# Patient Record
Sex: Male | Born: 1960 | Race: Black or African American | Hispanic: No | Marital: Single | Smoking: Current some day smoker
Health system: Southern US, Community
[De-identification: ages and names within clinical notes are randomized; demographics above are authoritative.]

## PROBLEM LIST (undated history)

## (undated) DIAGNOSIS — F332 Major depressive disorder, recurrent severe without psychotic features: Secondary | ICD-10-CM

## (undated) DIAGNOSIS — Z5900 Homelessness unspecified: Secondary | ICD-10-CM

## (undated) DIAGNOSIS — F1410 Cocaine abuse, uncomplicated: Secondary | ICD-10-CM

## (undated) DIAGNOSIS — F1994 Other psychoactive substance use, unspecified with psychoactive substance-induced mood disorder: Secondary | ICD-10-CM

## (undated) HISTORY — PX: HERNIA REPAIR: SHX51

---

## 2016-12-17 ENCOUNTER — Other Ambulatory Visit: Payer: Self-pay

## 2016-12-17 ENCOUNTER — Encounter (HOSPITAL_COMMUNITY): Payer: Self-pay | Admitting: Emergency Medicine

## 2016-12-17 ENCOUNTER — Emergency Department (HOSPITAL_COMMUNITY)
Admission: EM | Admit: 2016-12-17 | Discharge: 2016-12-17 | Discharge status: Against Medical Advice | Payer: Self-pay | Attending: Internal Medicine | Admitting: Internal Medicine

## 2016-12-17 DIAGNOSIS — R42 Dizziness and giddiness: Secondary | ICD-10-CM | POA: Insufficient documentation

## 2016-12-17 DIAGNOSIS — R51 Headache: Secondary | ICD-10-CM | POA: Insufficient documentation

## 2016-12-17 LAB — CBC
HEMATOCRIT: 43.7 % (ref 39.0–52.0)
Hemoglobin: 14.6 g/dL (ref 13.0–17.0)
MCH: 31.7 pg (ref 26.0–34.0)
MCHC: 33.4 g/dL (ref 30.0–36.0)
MCV: 94.8 fL (ref 78.0–100.0)
Platelets: 220 10*3/uL (ref 150–400)
RBC: 4.61 MIL/uL (ref 4.22–5.81)
RDW: 14.6 % (ref 11.5–15.5)
WBC: 5.9 10*3/uL (ref 4.0–10.5)

## 2016-12-17 LAB — BASIC METABOLIC PANEL
Anion gap: 10 (ref 5–15)
BUN: 13 mg/dL (ref 6–20)
CHLORIDE: 108 mmol/L (ref 101–111)
CO2: 22 mmol/L (ref 22–32)
Calcium: 9.6 mg/dL (ref 8.9–10.3)
Creatinine, Ser: 1.39 mg/dL — ABNORMAL HIGH (ref 0.61–1.24)
GFR calc Af Amer: >60 mL/min (ref >60)
GFR calc non Af Amer: 56 mL/min — ABNORMAL LOW (ref >60)
Glucose, Bld: 101 mg/dL — ABNORMAL HIGH (ref 65–99)
POTASSIUM: 3.4 mmol/L — AB (ref 3.5–5.1)
SODIUM: 140 mmol/L (ref 135–145)

## 2016-12-17 NOTE — ED Notes (Signed)
Pt informed RN that he was leaving.  RN explained that he was actually next as he was the longest wait.  RN encouraged him that several beds were opening up shortly however he decided to leave.  Pt encouraged to return should symptoms get worse.

## 2016-12-17 NOTE — ED Triage Notes (Signed)
Pt states "i've got these headaches, I get dizzy when I bend over, i've been having headaches about a week now, and my vision got blurry yesterday."

## 2016-12-17 NOTE — ED Notes (Signed)
Pt has no facial droop, no neuro deficits, no drift, grip strength equal, ambulatory with steady gait

## 2018-02-21 ENCOUNTER — Emergency Department (HOSPITAL_COMMUNITY)
Admission: EM | Admit: 2018-02-21 | Discharge: 2018-02-21 | Disposition: A | Discharge status: Home / Self Care | Payer: Self-pay | Attending: Emergency Medicine | Admitting: Emergency Medicine

## 2018-02-21 ENCOUNTER — Encounter (HOSPITAL_COMMUNITY): Payer: Self-pay

## 2018-02-21 DIAGNOSIS — R55 Syncope and collapse: Secondary | ICD-10-CM | POA: Insufficient documentation

## 2018-02-21 DIAGNOSIS — F17200 Nicotine dependence, unspecified, uncomplicated: Secondary | ICD-10-CM | POA: Insufficient documentation

## 2018-02-21 LAB — BASIC METABOLIC PANEL
Anion gap: 6 (ref 5–15)
BUN: 12 mg/dL (ref 6–20)
CHLORIDE: 108 mmol/L (ref 98–111)
CO2: 29 mmol/L (ref 22–32)
CREATININE: 1.5 mg/dL — AB (ref 0.61–1.24)
Calcium: 9.2 mg/dL (ref 8.9–10.3)
GFR calc Af Amer: 59 mL/min — ABNORMAL LOW (ref >60)
GFR calc non Af Amer: 51 mL/min — ABNORMAL LOW (ref >60)
GLUCOSE: 85 mg/dL (ref 70–99)
Potassium: 3.6 mmol/L (ref 3.5–5.1)
SODIUM: 143 mmol/L (ref 135–145)

## 2018-02-21 LAB — URINALYSIS, ROUTINE W REFLEX MICROSCOPIC
Bilirubin Urine: NEGATIVE
GLUCOSE, UA: NEGATIVE mg/dL
Hgb urine dipstick: NEGATIVE
KETONES UR: NEGATIVE mg/dL
Leukocytes, UA: NEGATIVE
Nitrite: NEGATIVE
PROTEIN: NEGATIVE mg/dL
Specific Gravity, Urine: 1.03 (ref 1.005–1.030)
pH: 5 (ref 5.0–8.0)

## 2018-02-21 LAB — CBC
HCT: 44.8 % (ref 39.0–52.0)
Hemoglobin: 14.3 g/dL (ref 13.0–17.0)
MCH: 32.8 pg (ref 26.0–34.0)
MCHC: 31.9 g/dL (ref 30.0–36.0)
MCV: 102.8 fL — AB (ref 78.0–100.0)
PLATELETS: 187 10*3/uL (ref 150–400)
RBC: 4.36 MIL/uL (ref 4.22–5.81)
RDW: 13.6 % (ref 11.5–15.5)
WBC: 3.7 10*3/uL — ABNORMAL LOW (ref 4.0–10.5)

## 2018-02-21 LAB — I-STAT TROPONIN, ED: Troponin i, poc: 0.04 ng/mL (ref 0.00–0.08)

## 2018-02-21 LAB — CBG MONITORING, ED: Glucose-Capillary: 131 mg/dL — ABNORMAL HIGH (ref 70–99)

## 2018-02-21 MED ORDER — SODIUM CHLORIDE 0.9 % IV BOLUS: 1000.0000 mL | Freq: Once | INTRAVENOUS | Status: DC

## 2018-02-21 NOTE — ED Notes (Signed)
Gave pt [place] sandwich, graham crackers with peanut butter, gingerale

## 2018-02-21 NOTE — ED Notes (Signed)
Patient ambulated without difficulty with a steady gait from triage to his room in Pod C.

## 2018-02-21 NOTE — ED Notes (Signed)
Due to dark color and odor, sent a urine culture with the urine specimen

## 2018-02-21 NOTE — ED Provider Notes (Signed)
[facility] EMERGENCY DEPARTMENT Provider Note   CSN: [id number] Arrival date & time: 02/21/18  1340     History   Chief Complaint No chief complaint on file.   HPI Richard Moon is a 57 y.o. male.  HPI  57 year old male presents with near syncope.  Stated it first happened about a week ago and then again 3 days ago.  Happened again today.  3 days ago and today it happened while he is getting ready to go to work.  The episodes are brief and last about 5 minutes.  He will sometimes get a headache to start and then gets lightheaded like he is going to pass out.  Gets blurry vision, hand cramping, diaphoresis and diffuse weakness.  No chest pain or shortness of breath.  No vomiting.  He is been getting headaches on and off for several months but these are not daily headaches and are never severe in quality.  Each time he seems to be in a hot area like in his garage or outside.  Sometimes this occurs while standing and sometimes not.  Currently he feels completely asymptomatic. Urine has been darker than normal for about 2 weeks.  History reviewed. No pertinent past medical history.  There are no active problems to display for this patient.   Past Surgical History:  Procedure Laterality Date  . HERNIA REPAIR          Home Medications    Prior to Admission medications   Not on File    Family History No family history on file.  Social History Social History   Tobacco Use  . Smoking status: Current Some Day Smoker  . Smokeless tobacco: Never Used  Substance Use Topics  . Alcohol use: No  . Drug use: Not on file     Allergies   Patient has no known allergies.   Review of Systems Review of Systems  Constitutional: Positive for diaphoresis and fatigue. Negative for fever.  Eyes: Positive for visual disturbance.  Respiratory: Negative for shortness of breath.   Cardiovascular: Negative for chest pain and palpitations.  Gastrointestinal: Negative  for abdominal pain and vomiting.  Musculoskeletal: Positive for myalgias.  Neurological: Positive for light-headedness and headaches. Negative for weakness and numbness.  All other systems reviewed and are negative.    Physical Exam Updated Vital Signs BP 126/84 (BP Location: Right Arm)   Pulse (!) 58   Temp 98.5 F (36.9 C)   Resp 16   SpO2 100%   Physical Exam  Constitutional: He is oriented to person, place, and time. He appears well-developed and well-nourished. No distress.  HENT:  Head: Normocephalic and atraumatic.  Right Ear: External ear normal.  Left Ear: External ear normal.  Nose: Nose normal.  Eyes: Pupils are equal, round, and reactive to light. EOM are normal. Right eye exhibits no discharge. Left eye exhibits no discharge.  Neck: Neck supple.  Cardiovascular: Normal rate, regular rhythm and normal heart sounds.  No murmur heard. Pulmonary/Chest: Effort normal and breath sounds normal.  Abdominal: Soft. There is no tenderness.  Musculoskeletal: He exhibits no edema.  Neurological: He is alert and oriented to person, place, and time.  CN 3-12 grossly intact. 5/5 strength in all 4 extremities. Grossly normal sensation. Normal finger to nose.   Skin: Skin is warm and dry. He is not diaphoretic.  Nursing note and vitals reviewed.    ED Treatments / Results  Labs (all labs ordered are listed, but only abnormal results  are displayed) Labs Reviewed  BASIC METABOLIC PANEL - Abnormal; Notable for the following components:      Result Value   Creatinine, Ser 1.50 (*)    GFR calc non Af Amer 51 (*)    GFR calc Af Amer 59 (*)    All other components within normal limits  CBC - Abnormal; Notable for the following components:   WBC 3.7 (*)    MCV 102.8 (*)    All other components within normal limits  CBG MONITORING, ED - Abnormal; Notable for the following components:   Glucose-Capillary 131 (*)    All other components within normal limits  URINALYSIS, ROUTINE  W REFLEX MICROSCOPIC  I-STAT TROPONIN, ED    EKG EKG Interpretation  Date/Time:  Monday February 21 2018 14:08:51 EDT Ventricular Rate:  72 PR Interval:  132 QRS Duration: 92 QT Interval:  366 QTC Calculation: 400 R Axis:   89 Text Interpretation:  Normal sinus rhythm Incomplete right bundle branch block Nonspecific T wave abnormality Abnormal ECG T wave changes similar to June 2018 tachycardia resolved Confirmed by Pricilla Loveless [phone]) on 02/21/2018 2:58:10 PM   Radiology No results found.  Procedures Procedures (including critical care time)  Medications Ordered in ED Medications  sodium chloride 0.9 % bolus 1,000 mL (1,000 mLs Intravenous Refused 02/21/18 1614)     Initial Impression / Assessment and Plan / ED Course  I have reviewed the triage vital signs and the nursing notes.  Pertinent labs & imaging results that were available during my care of the patient were reviewed by me and considered in my medical decision making (see chart for details).     Patient is well-appearing and has a benign exam now.  His creatinine is minimally increased from before and I think his symptoms are most consistent with some dehydration.  Probably also exacerbated by being out in hot areas.  Electrolytes and other work-up is benign including normal urinalysis.  But I have encouraged him to increase oral fluids and offered IV fluids but he declines.  No focal neuro symptoms and he denies numbness, weakness.  He has been having on and off headaches but they are not consistent, significant he worse in the morning, or associate with vomiting and so I think acute intracranial mass or other acute CNS emergency is quite low and he can follow-up as outpatient with a PCP.  We discussed return precautions but he appears stable for discharge home.  No ACS-like symptoms or signs or symptoms of PE.  Final Clinical Impressions(s) / ED Diagnoses   Final diagnoses:  Near syncope    ED Discharge Orders     None       Pricilla Loveless, MD 02/21/18 [phone]

## 2018-02-21 NOTE — ED Triage Notes (Signed)
Patient complains of 2-3 episodes of dizziness, arm numbness and arm/hand cramping with leg weakness. States that he gets fatigued and the symptoms resolve. No CP, no SOB. Alert and oriented on arrival.

## 2018-02-21 NOTE — ED Notes (Signed)
Patient verbalizes understanding of discharge instructions. Opportunity for questioning and answers were provided. Armband removed by staff, pt discharged from ED.  

## 2018-02-21 NOTE — Discharge Instructions (Addendum)
Your symptoms today are likely from dehydration.  Be sure to stay out of the heat as much as possible and drink increased fluids.  Follow-up with a primary care physician.  If you develop any recurrent symptoms or pass out, develop a severe headache, vomiting, chest pain, shortness of breath, weakness on one side of your body or the other, or any other new/concerning symptoms and return to the ER for evaluation.

## 2018-02-21 NOTE — ED Notes (Addendum)
Pt states "heck no I don't want no IV fluids!! I want water!! How much longer am I going to have to be here??"  This RN explained to the patient the benefits of the IV fluids. Pt refused IV fluids. Dr. Criss Alvine aware.

## 2018-02-21 NOTE — ED Notes (Signed)
Patient had episode of diaphoresis-shirt wet. Patient alert and oriented, CBG 131

## 2018-03-11 ENCOUNTER — Emergency Department (HOSPITAL_COMMUNITY)
Admission: EM | Admit: 2018-03-11 | Discharge: 2018-03-12 | Disposition: A | Discharge status: Other Acute Inpatient Hospital | Payer: Self-pay | Attending: Emergency Medicine | Admitting: Emergency Medicine

## 2018-03-11 ENCOUNTER — Encounter (HOSPITAL_COMMUNITY): Payer: Self-pay | Admitting: Emergency Medicine

## 2018-03-11 ENCOUNTER — Other Ambulatory Visit: Payer: Self-pay

## 2018-03-11 DIAGNOSIS — F1110 Opioid abuse, uncomplicated: Secondary | ICD-10-CM | POA: Insufficient documentation

## 2018-03-11 DIAGNOSIS — F17210 Nicotine dependence, cigarettes, uncomplicated: Secondary | ICD-10-CM | POA: Insufficient documentation

## 2018-03-11 DIAGNOSIS — F1420 Cocaine dependence, uncomplicated: Secondary | ICD-10-CM | POA: Insufficient documentation

## 2018-03-11 DIAGNOSIS — F1220 Cannabis dependence, uncomplicated: Secondary | ICD-10-CM | POA: Insufficient documentation

## 2018-03-11 DIAGNOSIS — F1910 Other psychoactive substance abuse, uncomplicated: Secondary | ICD-10-CM

## 2018-03-11 DIAGNOSIS — R45851 Suicidal ideations: Secondary | ICD-10-CM | POA: Insufficient documentation

## 2018-03-11 DIAGNOSIS — F332 Major depressive disorder, recurrent severe without psychotic features: Secondary | ICD-10-CM | POA: Insufficient documentation

## 2018-03-11 DIAGNOSIS — Z008 Encounter for other general examination: Secondary | ICD-10-CM | POA: Insufficient documentation

## 2018-03-11 LAB — COMPREHENSIVE METABOLIC PANEL
ALT: 17 U/L (ref 0–44)
ANION GAP: 11 (ref 5–15)
AST: 33 U/L (ref 15–41)
Albumin: 3.8 g/dL (ref 3.5–5.0)
Alkaline Phosphatase: 54 U/L (ref 38–126)
BUN: 14 mg/dL (ref 6–20)
CALCIUM: 9.5 mg/dL (ref 8.9–10.3)
CHLORIDE: 106 mmol/L (ref 98–111)
CO2: 25 mmol/L (ref 22–32)
Creatinine, Ser: 1.49 mg/dL — ABNORMAL HIGH (ref 0.61–1.24)
GFR calc non Af Amer: 51 mL/min — ABNORMAL LOW (ref >60)
GFR, EST AFRICAN AMERICAN: 59 mL/min — AB (ref >60)
Glucose, Bld: 99 mg/dL (ref 70–99)
POTASSIUM: 3.9 mmol/L (ref 3.5–5.1)
Sodium: 142 mmol/L (ref 135–145)
Total Bilirubin: 0.8 mg/dL (ref 0.3–1.2)
Total Protein: 6.8 g/dL (ref 6.5–8.1)

## 2018-03-11 LAB — CBC
HCT: 49.6 % (ref 39.0–52.0)
HEMOGLOBIN: 16 g/dL (ref 13.0–17.0)
MCH: 32.6 pg (ref 26.0–34.0)
MCHC: 32.3 g/dL (ref 30.0–36.0)
MCV: 101 fL — ABNORMAL HIGH (ref 78.0–100.0)
Platelets: 238 10*3/uL (ref 150–400)
RBC: 4.91 MIL/uL (ref 4.22–5.81)
RDW: 13.3 % (ref 11.5–15.5)
WBC: 7.6 10*3/uL (ref 4.0–10.5)

## 2018-03-11 LAB — RAPID URINE DRUG SCREEN, HOSP PERFORMED
Amphetamines: NOT DETECTED
BENZODIAZEPINES: NOT DETECTED
Barbiturates: NOT DETECTED
COCAINE: POSITIVE — AB
OPIATES: NOT DETECTED
TETRAHYDROCANNABINOL: NOT DETECTED

## 2018-03-11 LAB — ACETAMINOPHEN LEVEL

## 2018-03-11 LAB — SALICYLATE LEVEL

## 2018-03-11 LAB — ETHANOL: Alcohol, Ethyl (B): <10 mg/dL (ref <10)

## 2018-03-11 MED ORDER — LORAZEPAM 2 MG/ML IJ SOLN: 0.0000 mg | Freq: Two times a day (BID) | INTRAMUSCULAR | Status: DC

## 2018-03-11 MED ORDER — SODIUM CHLORIDE 0.9 % IV BOLUS: 1000.0000 mL | Freq: Once | INTRAVENOUS | Status: DC

## 2018-03-11 MED ORDER — VITAMIN B-1 100 MG PO TABS
100.0000 mg | ORAL_TABLET | Freq: Every day | ORAL | Status: DC
  Administered 2018-03-11 – 2018-03-12 (×2): 100 mg via ORAL
  Filled 2018-03-11 (×2): qty 1

## 2018-03-11 MED ORDER — LORAZEPAM 1 MG PO TABS: 0.0000 mg | ORAL_TABLET | Freq: Four times a day (QID) | ORAL | Status: DC

## 2018-03-11 MED ORDER — LORAZEPAM 1 MG PO TABS: 0.0000 mg | ORAL_TABLET | Freq: Two times a day (BID) | ORAL | Status: DC

## 2018-03-11 MED ORDER — LORAZEPAM 2 MG/ML IJ SOLN: 0.0000 mg | Freq: Four times a day (QID) | INTRAMUSCULAR | Status: DC

## 2018-03-11 MED ORDER — ACETAMINOPHEN 325 MG PO TABS
650.0000 mg | ORAL_TABLET | Freq: Once | ORAL | Status: AC
  Administered 2018-03-11: 650 mg via ORAL
  Filled 2018-03-11: qty 2

## 2018-03-11 MED ORDER — THIAMINE HCL 100 MG/ML IJ SOLN: 100.0000 mg | Freq: Every day | INTRAMUSCULAR | Status: DC

## 2018-03-11 NOTE — BH Assessment (Addendum)
Tele Assessment Note   Patient Name: Richard Moon MRN: [id number] Referring Physician: Dietrich Pates, PA-C Location of Patient: [facility], [facility] Location of Provider: Behavioral Health TTS Department  Richard Moon is an 57 y.o. single male who presents unaccompanied to [facility] ED reporting symptoms of depression, substance use and suicidal ideation. Pt reports he has use substances for years and has been using daily for the past three weeks. He says feels frustrated and hopeless due to his inability to stop using drugs. He reports today he was walking on a bridge and contemplating jumping off. He decided to seek treatment instead. Pt says he is severely depressed and acknowledges symptoms including crying spells, social withdrawal, loss of interest in usual pleasures, fatigue, irritability, decreased concentration, decreased sleep, decreased appetite and feelings of guilt and hopelessness. He reports a history of three previous suicide attempts by cutting his wrists. He denies current homicidal ideation or history of violence. He denies any history of auditory or visual hallucinations but says he has episodes of paranoia. Pt reports his primary drug is crack. He states he uses cocaine, marijuana, alcohol and infrequently uses heroin (see below for details of use).  Pt identifies consequences of substance use as his primary stressor. 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 He says many years in the fall his son died by suicide and this time of year is difficult. He says his mother is deceased and his other family members do not want contact with him. He cannot identify anyone in his life who is supportive. Pt reports he lives in a boarding house. He reports his father was an alcoholic and likely had mental health problems. Pt denies any legal problems. He states he received inpatient mental health and substance abuse treatment "many years ago."  He says he has no outpatient mental health providers and is not taking any psychiatric mediations.  Pt is dressed in hospital scrubs, alert and oriented x4. Pt speaks in a clear tone, at moderate volume and normal pace. Motor behavior appears normal. Eye contact is good. Pt's mood is depressed, guilt and mildly irritable; affect is congruent with mood. Thought process is coherent and relevant. There is no indication Pt is currently responding to internal stimuli or experiencing delusional thought content. Pt was cooperative throughout assessment. He states he is motivated for treatment and willing to sign voluntarily into a treatment facility.   Diagnosis:  F33.2 Major depressive disorder, Recurrent episode, Severe F14.2 Cocaine use disorder, Severe F12.20 Cannabis use disorder, moderate F11.10 Opioid use disorder, Mild  Past Medical History: History reviewed. No pertinent past medical history.  Past Surgical History:  Procedure Laterality Date  . HERNIA REPAIR      Family History: No family history on file.  Social History:  reports that he has been smoking cigarettes. He has never used smokeless tobacco. He reports that he drinks alcohol. He reports that he has current or past drug history. Drugs: Marijuana and Cocaine.  Additional Social History:  Alcohol / Drug Use Pain Medications: Pt denies Prescriptions: Pt denies Over the Counter: Pt denies History of alcohol / drug use?: Yes Longest period of sobriety (when/how long): Unknown Negative Consequences of Use: Financial, Work / Programmer, multimedia, Personal relationships Withdrawal Symptoms: Irritability Substance #1 Name of Substance 1: Cocaine (crack) 1 - Age of First Use: 32 1 - Amount (size/oz): Varies, Pt cannot estimate 1 - Frequency: Daily 1 - Duration: Three weeks this  episode 1 - Last Use / Amount: 03/10/18 Substance #2 Name of Substance 2: Alcohol 2 - Age of First Use: Adolescent 2 - Amount (size/oz): 1-2 beers 2 -  Frequency: Daily 2 - Duration: Ongoing for years 2 - Last Use / Amount: 03/10/18 Substance #3 Name of Substance 3: Marijuana 3 - Age of First Use: Adolescent 3 - Amount (size/oz): 1-2 blunts 3 - Frequency: 2-3 times per month 3 - Duration: Ongoing for years 3 - Last Use / Amount: 03/09/18 Substance #4 Name of Substance 4: Heroin 4 - Age of First Use: 50's 4 - Amount (size/oz): Varies 4 - Frequency: Pt reports he has used twice 4 - Duration: 3 weeks 4 - Last Use / Amount: 03/09/18  CIWA: CIWA-Ar BP: 116/82 Pulse Rate: 60 Nausea and Vomiting: no nausea and no vomiting Tactile Disturbances: none Tremor: no tremor Auditory Disturbances: not present Paroxysmal Sweats: no sweat visible Visual Disturbances: not present Anxiety: mildly anxious Headache, Fullness in Head: very mild Agitation: normal activity Orientation and Clouding of Sensorium: oriented and can do serial additions CIWA-Ar Total: 2 COWS:    Allergies: No Known Allergies  Home Medications:  (Not in a hospital admission)  OB/GYN Status:  No LMP for male patient.  General Assessment Data [facility] TTS Assessment: In system Is this a Tele or Face-to-Face Assessment?: Tele Assessment Is this an Initial Assessment or a Re-assessment for this encounter?: Initial Assessment Patient Accompanied by:: N/A(Alone) Language Other than English: No Living Arrangements: Other (Comment)(Boarding house) What gender do you identify as?: Male Marital status: Single Maiden name: NA Pregnancy Status: No Living Arrangements: Alone Can pt return to current living arrangement?: Yes Admission Status: Voluntary Is patient capable of signing voluntary admission?: Yes Referral Source: Self/Family/Friend Insurance type: Self-pay     Crisis Care Plan Living Arrangements: Alone Legal Guardian: Other:(Self) Name of Psychiatrist: None Name of Therapist: None  Education Status Is patient currently in  school?: No Is the patient employed, unemployed or receiving disability?: Unemployed  Risk to self with the past 6 months Suicidal Ideation: Yes-Currently Present Has patient been a risk to self within the past 6 months prior to admission? : Yes Suicidal Intent: Yes-Currently Present Has patient had any suicidal intent within the past 6 months prior to admission? : Yes Is patient at risk for suicide?: Yes Suicidal Plan?: Yes-Currently Present Has patient had any suicidal plan within the past 6 months prior to admission? : Yes Specify Current Suicidal Plan: Plan to jump from a bridge Access to Means: Yes Specify Access to Suicidal Means: Pt was walking on bridge today What has been your use of drugs/alcohol within the last 12 months?: Pt reports using crack, alcohol, marijuana and heroin Previous Attempts/Gestures: Yes How many times?: 3(History of cutting wrists) Other Self Harm Risks: None Triggers for Past Attempts: Family contact Intentional Self Injurious Behavior: None Family Suicide History: Yes(Son died by suicide) Recent stressful life event(s): Financial Problems, Job Loss, Other (Comment), Loss (Comment)(Family deaths) Persecutory voices/beliefs?: No Depression: Yes Depression Symptoms: Despondent, Insomnia, Tearfulness, Isolating, Fatigue, Guilt, Loss of interest in usual pleasures, Feeling worthless/self pity, Feeling angry/irritable Substance abuse history and/or treatment for substance abuse?: Yes Suicide prevention information given to non-admitted patients: Not applicable  Risk to Others within the past 6 months Homicidal Ideation: No Does patient have any lifetime risk of violence toward others beyond the six months prior to admission? : No Thoughts of Harm to Others: No Current Homicidal Intent: No Current Homicidal Plan:  No Access to Homicidal Means: No Identified Victim: None History of harm to others?: No Assessment of Violence: None Noted Violent Behavior  Description: Pt denies history of violence Does patient have access to weapons?: No Criminal Charges Pending?: No Does patient have a court date: No Is patient on probation?: No  Psychosis Hallucinations: None noted Delusions: None noted  Mental Status Report Appearance/Hygiene: In scrubs Eye Contact: Good Motor Activity: Unremarkable Speech: Logical/coherent Level of Consciousness: Alert Mood: Depressed, Irritable, Guilty Affect: Depressed Anxiety Level: Minimal Thought Processes: Coherent, Relevant Judgement: Impaired Orientation: Person, Place, Time, Situation, Appropriate for developmental age Obsessive Compulsive Thoughts/Behaviors: None  Cognitive Functioning Concentration: Normal Memory: Recent Intact, Remote Intact Is patient IDD: No Insight: Fair Impulse Control: Fair Appetite: Poor Have you had any weight changes? : No Change Total Hours of Sleep: 4 Vegetative Symptoms: None  ADLScreening [facility] Assessment Services) Patient's cognitive ability adequate to safely complete daily activities?: Yes Patient able to express need for assistance with ADLs?: Yes Independently performs ADLs?: Yes (appropriate for developmental age)  Prior Inpatient Therapy Prior Inpatient Therapy: Yes Prior Therapy Dates: Many years ago Prior Therapy Facilty/Provider(s): Unknown Reason for Treatment: Substance use  Prior Outpatient Therapy Prior Outpatient Therapy: No Does patient have an ACCT team?: No Does patient have Intensive In-House Services?  : No Does patient have [name] services? : No Does patient have P4CC services?: No  ADL Screening (condition at time of admission) Patient's cognitive ability adequate to safely complete daily activities?: Yes Is the patient deaf or have difficulty hearing?: No Does the patient have difficulty seeing, even when wearing glasses/contacts?: No Does the patient have difficulty concentrating, remembering, or making decisions?: No Patient  able to express need for assistance with ADLs?: Yes Does the patient have difficulty dressing or bathing?: No Independently performs ADLs?: Yes (appropriate for developmental age) Does the patient have difficulty walking or climbing stairs?: No Weakness of Legs: None Weakness of Arms/Hands: None  Home Assistive Devices/Equipment Home Assistive Devices/Equipment: None    Abuse/Neglect Assessment (Assessment to be complete while patient is alone) Abuse/Neglect Assessment Can Be Completed: Yes Physical Abuse: Denies Verbal Abuse: Denies Sexual Abuse: Denies Exploitation of patient/patient's resources: Denies Self-Neglect: Denies     Merchant navy officer (For Healthcare) Does Patient Have a Medical Advance Directive?: No Would patient like information on creating a medical advance directive?: No - Patient declined          Disposition: [facility], [facility] confirmed bed availability aftermidnight. Gave clinical report to Donell Sievert, PA who said Pt meets criteria for inpatient dual-diagnosis treatment and accepted Pt to the service of Dr. Sallyanne Havers, room 306-1. Notified Mancel Bale, MD and Duard Brady, RN of acceptance.  Disposition Initial Assessment Completed for this Encounter: Yes  This service was provided via telemedicine using a 2-way, interactive audio and video technology.  Names of all persons participating in this telemedicine service and their role in this encounter. Name: [name] Role: Patient  Name: Shela Commons, [facility] Role: TTS counselor         Harlin Rain Patsy [place], [facility], [facility], [facility] Triage Specialist [phone]  Pamalee Leyden 03/11/2018 7:32 PM

## 2018-03-11 NOTE — ED Notes (Signed)
[facility] Plan to transfer after midnight. Esignature pad not working, obtained transfer consent via paper and placed in medical records.

## 2018-03-11 NOTE — ED Notes (Addendum)
Pt is resting and appears comfortable.  Denies any complaints.  Sitter with pt

## 2018-03-11 NOTE — ED Notes (Signed)
Venida JarvisLocker #3 has not been inventory

## 2018-03-11 NOTE — ED Triage Notes (Signed)
Pt presents of thoughts of "jumping off the damn bridge" because "I am tired of doing these damn drugs." Pt reports being on drugs for a long time, heroin, crack, and marijuana. Last use was yesterday. Reporting mild neck pain. Pt is calm and cooperative at triage.

## 2018-03-11 NOTE — ED Provider Notes (Signed)
[facility] EMERGENCY [facility]PARTMENT Provider Note   CSN: [id number] Arrival date & time: 03/11/18  1320     History   Chief Complaint Chief Complaint  Patient presents with  . Suicidal    HPI Blair PromiseRonald Dinsmore is a 57 y.o. male who presents to ED for evaluation of suicidal ideations.  States that he wants to "jump off of a bridge" because he is trying to detox from cocaine, marijuana and heroin use.  He reports history of self-injurious behavior several years ago.  Denies any HI, AVH.  Last use of any drug was yesterday.  Reports drinking 1 beer daily.  Did not drink today or yesterday.  Denies any somatic complaints.  HPI  History reviewed. No pertinent past medical history.  There are no active problems to display for this patient.   Past Surgical History:  Procedure Laterality Date  . HERNIA REPAIR          Home Medications    Prior to Admission medications   Not on File    Family History No family history on file.  Social History Social History   Tobacco Use  . Smoking status: Current Some Day Smoker    Types: Cigarettes  . Smokeless tobacco: Never Used  Substance Use Topics  . Alcohol use: Yes    Comment: Occasionally   . Drug use: Yes    Types: Marijuana, Cocaine     Allergies   Patient has no known allergies.   Review of Systems Review of Systems  Constitutional: Negative for appetite change, chills and fever.  HENT: Negative for ear pain, rhinorrhea, sneezing and sore throat.   Eyes: Negative for photophobia and visual disturbance.  Respiratory: Negative for cough, chest tightness, shortness of breath and wheezing.   Cardiovascular: Negative for chest pain and palpitations.  Gastrointestinal: Negative for abdominal pain, blood in stool, constipation, diarrhea, nausea and vomiting.  Genitourinary: Negative for dysuria, hematuria and urgency.  Musculoskeletal: Negative for myalgias.  Skin: Negative for rash.  Neurological:  Negative for dizziness, weakness and light-headedness.  Psychiatric/Behavioral: Positive for dysphoric mood and suicidal ideas. Negative for self-injury and sleep disturbance.     Physical Exam Updated Vital Signs BP 116/77 (BP Location: Right Arm)   Pulse 82   Temp 98.9 F (37.2 C) (Oral)   Resp 18   Ht 5\' 10"  (1.778 m)   Wt 68 kg   SpO2 97%   BMI 21.52 kg/m   Physical Exam  Constitutional: He appears well-developed and well-nourished. No distress.  HENT:  Head: Normocephalic and atraumatic.  Nose: Nose normal.  Eyes: Conjunctivae and EOM are normal. Left eye exhibits no discharge. No scleral icterus.  Neck: Normal range of motion. Neck supple.  Cardiovascular: Normal rate, regular rhythm, normal heart sounds and intact distal pulses. Exam reveals no gallop and no friction rub.  No murmur heard. Pulmonary/Chest: Effort normal and breath sounds normal. No respiratory distress.  Abdominal: Soft. Bowel sounds are normal. He exhibits no distension. There is no tenderness. There is no guarding.  Musculoskeletal: Normal range of motion. He exhibits no edema.  Neurological: He is alert. He exhibits normal muscle tone. Coordination normal.  Skin: Skin is warm and dry. No rash noted.  Psychiatric: His affect is blunt. He expresses suicidal ideation. He expresses no homicidal ideation. He expresses suicidal plans. He expresses no homicidal plans.  Nursing note and vitals reviewed.    ED Treatments / Results  Labs (all labs ordered are listed, but only abnormal  results are displayed) Labs Reviewed  COMPREHENSIVE METABOLIC PANEL - Abnormal; Notable for the following components:      Result Value   Creatinine, Ser 1.49 (*)    GFR calc non Af Amer 51 (*)    GFR calc Af Amer 59 (*)    All other components within normal limits  ACETAMINOPHEN LEVEL - Abnormal; Notable for the following components:   Acetaminophen (Tylenol), Serum <10 (*)    All other components within normal limits    CBC - Abnormal; Notable for the following components:   MCV 101.0 (*)    All other components within normal limits  ETHANOL  SALICYLATE LEVEL  RAPID URINE DRUG SCREEN, HOSP PERFORMED    EKG None  Radiology No results found.  Procedures Procedures (including critical care time)  Medications Ordered in ED Medications - No data to display   Initial Impression / Assessment and Plan / ED Course  I have reviewed the triage vital signs and the nursing notes.  Pertinent labs & imaging results that were available during my care of the patient were reviewed by me and considered in my medical decision making (see chart for details).     57 year old male with past medical history of polysubstance abuse presents for suicidal ideations.  States that he wants to jump off of a bridge.  He wants to detox from his cocaine, marijuana and heroin use.  He also reports drinking a beer daily.  On my exam he denies any somatic complaints with the exception of mild right-sided shoulder pain.  Denies any injuries or falls.  Medical screening lab work is unremarkable with the exception of elevation in creatinine to 1.4.  Patient offered IV fluids to help but declines.  He is medically cleared for TTS evaluation.  Portions of this note were generated with Scientist, clinical (histocompatibility and immunogenetics). Dictation errors may occur despite best attempts at proofreading.   Final Clinical Impressions(s) / ED Diagnoses   Final diagnoses:  Suicidal ideation  Polysubstance abuse [facility])    ED Discharge Orders    None       Dietrich Pates, PA-C 03/11/18 1835    Mancel Bale, MD 03/13/18 1005

## 2018-03-11 NOTE — ED Notes (Signed)
Belongings inventoried and placed in locker 3 in pod F utility room according to policy. Includes 5 shirts, 1 pair pants, 1 belt, 1 pair shoes, 1 pair socks, 1 wireless speaker and various charging cables, 1 bible, 1 blue backpack, 3 disposable razors.

## 2018-03-11 NOTE — ED Notes (Signed)
[facility] called and asked to send the patient over to them after 8am. bc of low grade fever. Called PA  and was advised to give tylenol. Pt made aware he would be moved tomorrow morning.

## 2018-03-11 NOTE — ED Provider Notes (Signed)
Patient placed in Quick Look pathway, seen and evaluated   Chief Complaint: S/I  HPI:   Richard Moon is a 57 y.o. male who presents to the ED with S/I. Patient reports thoughts of "jumping off the damn bridge" because "I am tired of doing these damn drugs." Pt reports being on drugs for a long time, heroin, crack, and marijuana. Last use was yesterday. Reporting mild neck pain.  ROS: Psych: S/I  Physical Exam:  BP 116/77 (BP Location: Right Arm)   Pulse 82   Temp 98.9 F (37.2 C) (Oral)   Resp 18   Ht 5\' 10"  (1.778 m)   Wt 68 kg   SpO2 97%   BMI 21.52 kg/m    Gen: No distress, cooperative  Neuro: Awake and Alert  Skin: Warm and dry  Psych: S/I, tearful   Initiation of care has begun. The patient has been counseled on the process, plan, and necessity for staying for the completion/evaluation, and the remainder of the medical screening examination    Janne Napoleoneese, Hope M, NP 03/11/18 1414    Pricilla LovelessGoldston, Scott, MD 03/12/18 [phone]

## 2018-03-11 NOTE — ED Notes (Signed)
Tray ordered.

## 2018-03-12 ENCOUNTER — Encounter (HOSPITAL_COMMUNITY): Payer: Self-pay

## 2018-03-12 ENCOUNTER — Inpatient Hospital Stay (HOSPITAL_COMMUNITY)
Admission: AD | Admit: 2018-03-12 | Discharge: 2018-03-16 | DRG: 885 | Disposition: A | Discharge status: Home / Self Care | Payer: Federal, State, Local not specified - Other | Source: Intra-hospital | Attending: Psychiatry | Admitting: Psychiatry

## 2018-03-12 ENCOUNTER — Other Ambulatory Visit: Payer: Self-pay

## 2018-03-12 DIAGNOSIS — R45851 Suicidal ideations: Secondary | ICD-10-CM | POA: Diagnosis present

## 2018-03-12 DIAGNOSIS — Z79899 Other long term (current) drug therapy: Secondary | ICD-10-CM

## 2018-03-12 DIAGNOSIS — F419 Anxiety disorder, unspecified: Secondary | ICD-10-CM | POA: Diagnosis present

## 2018-03-12 DIAGNOSIS — F1020 Alcohol dependence, uncomplicated: Secondary | ICD-10-CM | POA: Diagnosis present

## 2018-03-12 DIAGNOSIS — G4700 Insomnia, unspecified: Secondary | ICD-10-CM | POA: Diagnosis present

## 2018-03-12 DIAGNOSIS — F322 Major depressive disorder, single episode, severe without psychotic features: Secondary | ICD-10-CM | POA: Diagnosis present

## 2018-03-12 DIAGNOSIS — R058 Other specified cough: Secondary | ICD-10-CM

## 2018-03-12 DIAGNOSIS — Z915 Personal history of self-harm: Secondary | ICD-10-CM | POA: Diagnosis not present

## 2018-03-12 DIAGNOSIS — R05 Cough: Secondary | ICD-10-CM

## 2018-03-12 DIAGNOSIS — F332 Major depressive disorder, recurrent severe without psychotic features: Principal | ICD-10-CM | POA: Diagnosis present

## 2018-03-12 DIAGNOSIS — F1424 Cocaine dependence with cocaine-induced mood disorder: Secondary | ICD-10-CM

## 2018-03-12 DIAGNOSIS — F17210 Nicotine dependence, cigarettes, uncomplicated: Secondary | ICD-10-CM | POA: Diagnosis present

## 2018-03-12 MED ORDER — ENSURE ENLIVE PO LIQD
237.0000 mL | Freq: Two times a day (BID) | ORAL | Status: DC
  Administered 2018-03-13 – 2018-03-15 (×6): 237 mL via ORAL

## 2018-03-12 MED ORDER — ACETAMINOPHEN 325 MG PO TABS
650.0000 mg | ORAL_TABLET | Freq: Four times a day (QID) | ORAL | Status: DC | PRN
  Administered 2018-03-13 – 2018-03-15 (×2): 650 mg via ORAL
  Filled 2018-03-12 (×2): qty 2

## 2018-03-12 MED ORDER — MAGNESIUM HYDROXIDE 400 MG/5ML PO SUSP: 30.0000 mL | Freq: Every day | ORAL | Status: DC | PRN

## 2018-03-12 MED ORDER — ALUM & MAG HYDROXIDE-SIMETH 200-200-20 MG/5ML PO SUSP: 30.0000 mL | ORAL | Status: DC | PRN

## 2018-03-12 MED ORDER — TRAZODONE HCL 50 MG PO TABS
50.0000 mg | ORAL_TABLET | Freq: Every evening | ORAL | Status: DC | PRN
  Administered 2018-03-13 – 2018-03-14 (×3): 50 mg via ORAL
  Filled 2018-03-12 (×8): qty 1

## 2018-03-12 MED ORDER — HYDROXYZINE HCL 25 MG PO TABS
25.0000 mg | ORAL_TABLET | Freq: Four times a day (QID) | ORAL | Status: DC | PRN
  Filled 2018-03-12: qty 20

## 2018-03-12 NOTE — ED Notes (Signed)
Pt ate breakfast and is eating snack. Pt voices understanding and agreement w/tx plan - [facility] to [facility] and will be transported this evening - after shift change. Pt's stretcher changed out for bed for comfort.

## 2018-03-12 NOTE — ED Notes (Signed)
Per Joellyn HaffShaletta, AC, [facility] - after 7pm.

## 2018-03-12 NOTE — ED Notes (Signed)
Staffing called and took sitter away from patient.

## 2018-03-12 NOTE — Progress Notes (Signed)
Admission Note:  D: [age] yr male who presents VC in no acute distress for the treatment of SI and Depression. Pt appears flat and depressed. Pt was calm and cooperative with admission process. Pt presents with passive SI and contracts for safety upon admission. Pt denies AVH . Pt stated he had started feeling depressed since the recent passing of his mom and the anniversary of his son killing himself 3 yrs ago started him to using Crack for the past few weeks and decided to come in to get help before he hurt himself.   A:Skin was assessed and found to be clear of any abnormal marks apart from varicose Veins lower legs. PT searched and no contraband found, POC and unit policies explained and understanding verbalized. Consents obtained. Food and fluids offered, and  Accepted.  R: Pt had no additional questions or concerns.

## 2018-03-12 NOTE — Tx Team (Signed)
Initial Treatment Plan 03/12/2018 10:18 PM Richard Moon [id number]    PATIENT STRESSORS: Loss of son Marital or family conflict Medication change or noncompliance Substance abuse   PATIENT STRENGTHS: Communication skills General fund of knowledge Motivation for treatment/growth   PATIENT IDENTIFIED PROBLEMS: Risk for suicide  depression  Loss son / mother  SA  "want to talk some professional people about what's going on"             DISCHARGE CRITERIA:  Ability to meet basic life and health needs Improved stabilization in mood, thinking, and/or behavior Verbal commitment to aftercare and medication compliance  PRELIMINARY DISCHARGE PLAN: Attend aftercare/continuing care group Attend PHP/IOP Outpatient therapy  PATIENT/FAMILY INVOLVEMENT: This treatment plan has been presented to and reviewed with the patient, Richard Moon.  The patient and family have been given the opportunity to ask questions and make suggestions.  Delos HaringPhillips, Lindsay Soulliere A, RN 03/12/2018, 10:18 PM

## 2018-03-13 DIAGNOSIS — R45851 Suicidal ideations: Secondary | ICD-10-CM

## 2018-03-13 DIAGNOSIS — F322 Major depressive disorder, single episode, severe without psychotic features: Secondary | ICD-10-CM

## 2018-03-13 DIAGNOSIS — F1424 Cocaine dependence with cocaine-induced mood disorder: Secondary | ICD-10-CM

## 2018-03-13 MED ORDER — SERTRALINE HCL 25 MG PO TABS
25.0000 mg | ORAL_TABLET | Freq: Every day | ORAL | Status: DC
  Administered 2018-03-13 – 2018-03-14 (×2): 25 mg via ORAL
  Filled 2018-03-13 (×3): qty 1

## 2018-03-13 NOTE — [facility] Group Notes (Signed)
Pt is currently speaking with the social worker and was unable to attend orientation/goals/psycho-ed group.

## 2018-03-13 NOTE — [facility] Suicide Risk Assessment (Signed)
[facility] INPATIENT:  Family/Significant Other Suicide Prevention Education  Suicide Prevention Education:  Patient Refusal for Family/Significant Other Suicide Prevention Education: The patient Richard Moon has refused to provide written consent for family/significant other to be provided Family/Significant Other Suicide Prevention Education during admission and/or prior to discharge.He stated there was no one he wanted to receive this information. CSW provided him the SPE to him  and left prevention literature  with him.  Physician notified.  [name] 03/13/2018, 1:56 PM

## 2018-03-13 NOTE — Progress Notes (Signed)
DAR NOTE: Patient presents with calm affect and pleasant mood.  Pt has been in the room most of the day, came out for groups, meds and meals. Not problems reported. Pt reports fair sleep, fair appetite, low energy, and poor concentration. Pt complained of headache, tylenol 650 mg PO given with relief. Denies SI/HI,  auditory and visual hallucinations.  Rates depression at 8, hopelessness at 10, and anxiety at 8.  Maintained on routine safety checks.  Medications given as prescribed.  Support and encouragement offered as needed.  Attended group and participated.  States goal for today is " get my self together."  Will continue to monitor.

## 2018-03-13 NOTE — [facility] Counselor (Signed)
Adult Comprehensive Assessment  Patient ID: Richard Moon, male   DOB: 1961/03/30, 57 y.o.   MRN: [id number]  Information Source: Information source: Patient  Current Stressors:  Patient states their primary concerns and needs for treatment are:: Getting into long term treatment Patient states their goals for this hospitilization and ongoing recovery are:: Get my mind focused what is going on in my life so I won't be thinking about killing myself Educational / Learning stressors: no Employment / Job issues: "I think I may have lost my job"  Family Relationships: Me and my daughter don'tget along but otherwise no Surveyor, quantity / Lack of resources (include bankruptcy): no Housing / Lack of housing: no I have.'t paid rent Physical health (include injuries & life threatening diseases): so far it i good Social relationships: Not an issue Substance abuse: Yes I want to get clean, cocaine, marijuana and alcohol Bereavement / Loss: With my mother and son. Mother just died amd so 3 years ago  Living/Environment/Situation:  Living Arrangements: Alone Living conditions (as described by patient or guardian): fair Who else lives in the home?: no one else How long has patient lived in current situation?: years What is atmosphere in current home: Comfortable  Family History:  Marital status: Single Are you sexually active?: Yes What is your sexual orientation?: straight  Has your sexual activity been affected by drugs, alcohol, medication, or emotional stress?: yes Does patient have children?: Yes How many children?: 1(son died [id number], 39 year old daughter) How is patient's relationship with their children?: Poor relationship with daughter  Childhood History:  By whom was/is the patient raised?: Both parents Description of patient's relationship with caregiver when they were a child: normal Patient's description of current relationship with people who raised him/her: both parents are now  deceased How were you disciplined when you got in trouble as a child/adolescent?: spanked Does patient have siblings?: Yes Number of Siblings: 4 Description of patient's current relationship with siblings: it's ok Did patient suffer any verbal/emotional/physical/sexual abuse as a child?: No Did patient suffer from severe childhood neglect?: No Has patient ever been sexually abused/assaulted/raped as an adolescent or adult?: No Was the patient ever a victim of a crime or a disaster?: No Witnessed domestic violence?: No Has patient been effected by domestic violence as an adult?: No  Education:  Highest grade of school patient has completed: 12th grade Currently a student?: No Learning disability?: No  Employment/Work Situation:   Employment situation: Employed Where is patient currently employed?: [employer] long has patient been employed?: 1 year Patient's job has been impacted by current illness: No What is the longest time patient has a held a job?: four years ago Where was the patient employed at that time?: [employer]  Did You Receive Any Psychiatric Treatment/Services While in Equities trader?: No Are There Guns or Other Weapons in Your Home?: No  Financial Resources:   Financial resources: Income from employment Does patient have a representative payee or guardian?: No  Alcohol/Substance Abuse:   What has been your use of drugs/alcohol within the last 12 months?: weekly use If attempted suicide, did drugs/alcohol play a role in this?: Yes Alcohol/Substance Abuse Treatment Hx: Past Tx, Inpatient If yes, describe treatment: inpatient treatment does not recall much about it Has alcohol/substance abuse ever caused legal problems?: Yes(wrote bad checks)  Social Support System:   Patient's Community Support System: None Type of faith/religion: [employer] How does patient's faith help to cope with current illness?: "trying to get to that  place"  Leisure/Recreation:   Leisure and Hobbies: walks  Strengths/Needs:   What is the patient's perception of their strengths?: Good listener, trustworthy, Patient states they can use these personal strengths during their treatment to contribute to their recovery: yes, listening is a good thing and trusting who I am listening to Patient states these barriers may affect/interfere with their treatment: no Patient states these barriers may affect their return to the community: no  Discharge Plan:   Currently receiving community mental health services: No Patient states concerns and preferences for aftercare planning are: long term treatment Patient states they will know when they are safe and ready for discharge when: when my thinking has become more positive Does patient have access to transportation?: No Does patient have financial barriers related to discharge medications?: Yes Patient description of barriers related to discharge medications: Financial - cost maybe a barrier Plan for no access to transportation at discharge: CSW staff will assist Patient identify resources Plan for living situation after discharge: Long term treatment and find better place Will patient be returning to same living situation after discharge?: No(Patient stated that he does not feel current living situation is conducive for him living a drug free life.)  Summary/Recommendations:   Summary and Recommendations (to be completed by the evaluator): Patient is an 57 year old single male who [facility]ac[facility] reporting symptoms of depression, substance use and suicidal ideation. Pt reports he has use substances for years and has been using daily for the past three weeks. Primary stressors include his inability to abstain from drugs. He is reporting his primary drug is crack. He states he uses cocaine, marijuana, alcohol and infrequently uses heroin. The patient is describing crying spells,  social withdrawal, loss of interest in usual pleasures, fatigue, irritability, decreased concentration, decreased sleep, decreased appetite and feelings of guilt and hopelessness. He reports a history of three previous suicide attempts by cutting his wrists. He denies current homicidal ideation or history of violence. He denies any history of auditory or visual hallucinations but says he has episodes of paranoia.   Patient will benefit from crisis stabilization, medication evaluation, group therapy and psychoeducation, in addition to case management for discharge planning. At discharge it is recommended that Patient adhere to the established discharge plan and continue in treatment.  Evorn Gongonnie D Thayden Lemire. 03/13/2018

## 2018-03-13 NOTE — [facility] Suicide Risk Assessment (Signed)
[facility] Ad[facility]icide [facility]   Nursing information obtained from:  Patient Demographic factors:  Male, Low socioeconomic status Current Mental Status:  Suicidal ideation indicated by patient, Suicide plan, Self-harm thoughts Loss Factors:  Loss of significant relationship Historical Factors:  Anniversary of important loss Risk Reduction Factors:  Employed  Total Time spent with patient: 30 minutes Principal Problem: <principal problem not specified> Diagnosis:   Patient Active Problem List   Diagnosis Date Noted  . MDD (major depressive disorder), severe (HCC) [F32.2] 03/12/2018   Subjective Data: Patient is seen and examined.  Patient is a 57 year old male with a past psychiatric history significant for cocaine use disorder/cocaine dependence and alcohol use disorder.  [facility] on 9/20 with suicidal ideation.  The patient reported that he is used substances for years, and has been using daily for the last 3 weeks.  He has been feeling frustrated and hopeless over his inability to stop using drugs.  He had been walking over a bridge on 9/20 and was contemplating jumping off.  He admitted to depressive symptoms including crying spells, social withdrawal, lack of interest in usual pleasures, fatigue.  He reported a history of previous suicide attempt by cutting his wrist.  He stated this occurred 10 years ago, and he was not hospitalized at that time.  He also admitted using marijuana, alcohol and infrequently heroin.  Unfortunately his son died by suicide in the past, and its near that anniversary.  That is been bothersome to him.  He also stated his mother died somewhere around this time.  Patient has a history of having one previous rehabilitation stay.  This was [facility] many [facility].  He was admitted to the hospital for evaluation and stabilization.  Continued Clinical Symptoms:  Alcohol Use Disorder Identification Test  Final Score (AUDIT): 3 The "Alcohol Use Disorders Identification Test", Guidelines for Use in Primary Care, Second Edition.  World Science writerHealth Organization [facility](WHO). Score between 0-7:  no or low risk or alcohol related problems. Score between 8-15:  moderate risk of alcohol related problems. Score between 16-19:  high risk of alcohol related problems. Score 20 or above:  warrants further diagnostic evaluation for alcohol dependence and treatment.   CLINICAL FACTORS:   Depression:   Anhedonia Comorbid alcohol abuse/dependence Hopelessness Impulsivity Insomnia Alcohol/Substance Abuse/Dependencies   Musculoskeletal: Strength & Muscle Tone: within normal limits Gait & Station: normal Patient leans: N/A  Psychiatric Specialty Exam: Physical Exam  Nursing note and vitals reviewed. Constitutional: He is oriented to person, place, and time. He appears well-developed and well-nourished.  HENT:  Head: Normocephalic and atraumatic.  Respiratory: Effort normal.  Neurological: He is alert and oriented to person, place, and time.    ROS  Blood pressure 109/77, pulse 85, temperature 98.6 F (37 C), temperature source Oral, resp. rate 20, height 5\' 10"  (1.778 m), weight 63.5 kg.Body mass index is 20.09 kg/m.  General Appearance: Disheveled  Eye Contact:  Fair  Speech:  Slow  Volume:  Decreased  Mood:  Depressed  Affect:  Congruent  Thought Process:  Coherent and Descriptions of Associations: Intact  Orientation:  Full (Time, Place, and Person)  Thought Content:  Logical  Suicidal Thoughts:  Yes.  without intent/plan  Homicidal Thoughts:  No  Memory:  Immediate;   Fair Recent;   Fair Remote;   Fair  Judgement:  Impaired  Insight:  Fair  Psychomotor Activity:  Decreased and Psychomotor Retardation  Concentration:  Concentration: Fair and  Attention Span: Fair  Recall:  [employer] of Knowledge:  Fair  Language:  Good  Akathisia:  Negative  Handed:  Right  AIMS (if indicated):      Assets:  Communication Skills Desire for Improvement Housing Physical Health Resilience  ADL's:  Intact  Cognition:  WNL  Sleep:  Number of Hours: 6.75      COGNITIVE FEATURES THAT CONTRIBUTE TO RISK:  None    SUICIDE RISK:   Minimal: No identifiable suicidal ideation.  Patients presenting with no risk factors but with morbid ruminations; may be classified as minimal risk based on the severity of the depressive symptoms  PLAN OF CARE: Patient is seen and examined.  Patient is a 57 year old male with the above-stated past psychiatric history who was admitted secondary to suicidal ideation, depressive symptoms and substance abuse.  #1 depression/substance-induced mood disorder-given his history we will start him on Zoloft 25 mg p.o. daily.  #2 cocaine/heroin/alcohol/marijuana use disorders/dependence-so far his vital signs are stable.  His drug of choice has been cocaine.  No withdrawal syndrome from that.  Continue to monitor this.  Patient will discuss with social work as options for rehabilitation program.  #3 acute renal injury-patient's creatinine is elevated to approximately 1.5.  Most likely secondary to cocaine.  Patient's vital signs are stable.  There is no protein in his urine.  Hopefully this will resolve.  #4 disposition planning-patient will discuss with social work as options for substance abuse treatment.  I certify that inpatient services furnished can reasonably be expected to improve the patient's condition.   Antonieta Pert, MD 03/13/2018, 8:10 AM

## 2018-03-13 NOTE — [facility] Group Notes (Signed)
[facility] LCSW Group Therapy Note  03/13/2018  10:00-11:00AM  Type of Therapy and Topic:  Group Therapy:  Being Your Own Support  Participation Level:  Did Not Attend   Description of Group:  Patients in this group were introduced to the concept that self-support is an essential part of recovery.  A song entitled "My Own Hero" was played and a group discussion ensued in which patients stated they could relate to the song and it inspired them to realize they have be willing to help themselves in order to succeed, because other people cannot achieve sobriety or stability for them.  We discussed adding a variety of healthy supports to address the various needs in their lives.  A song was played called "I Know Where I've Been" toward the end of group and used to conduct an inspirational wrap-up to group of remembering how far they have already come in their journey.  Therapeutic Goals: 1)  demonstrate the importance of being a part of one's own support system 2)  discuss reasons people in one's life may eventually be unable to be continually supportive  3)  identify the patient's current support system and   4)  elicit commitments to add healthy supports and to become more conscious of being self-supportive   Summary of Patient Progress:  N/A   Therapeutic Modalities:   Motivational Interviewing Activity  Argil Mahl J Grossman-Orr       

## 2018-03-13 NOTE — H&P (Signed)
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 Cocaine dependence with cocaine-induced mood disorder (HCC) [F14.24]   . MDD (major depressive disorder), severe (HCC) [F32.2] 03/12/2018   History of Present Illness: Patient is seen and examined.  Patient is a 57 year old male with a past psychiatric history significant for cocaine use disorder/cocaine dependence and alcohol use disorder.  Patient presented to the [facility] emergency department on 9/20 with suicidal ideation.  The patient reported that he is used substances for years, and has been using daily for the last 3 weeks.  He has been feeling frustrated and hopeless over his inability to stop using drugs.  He had been walking over a bridge on 9/20 and was contemplating jumping off.  He admitted to depressive symptoms including crying spells, social withdrawal, lack of interest in usual pleasures, fatigue.  He reported a history of previous suicide attempt by cutting his wrist.  He stated this occurred 10 years ago, and he was not hospitalized at that time.  He also admitted using marijuana, alcohol and infrequently heroin.  Unfortunately his son died by suicide in the past, and its near that anniversary.  That is been bothersome to him.  He also stated his mother died somewhere around this time.  Patient has a history of having one previous rehabilitation stay.  This was apparently at [facility] many years ago.  He was admitted to the hospital for evaluation and stabilization.  Associated Signs/Symptoms: Depression Symptoms:  depressed mood, anhedonia, insomnia, psychomotor retardation, fatigue, feelings of  worthlessness/guilt, difficulty concentrating, hopelessness, suicidal thoughts without plan, anxiety, loss of energy/fatigue, disturbed sleep, weight loss, (Hypo) Manic Symptoms:  Impulsivity, Irritable Mood, Anxiety Symptoms:  Excessive Worry, Psychotic Symptoms:  Denied PTSD Symptoms: Had a traumatic exposure:  In childhood Total Time spent with patient: 30 minutes  Past Psychiatric History: Patient had one previous psychiatric admission approximately 10 years ago.  He was cutting his wrist and it was a suicide attempt.  He also has been in substance abuse treatment in the past.  Is the patient at risk to self? Yes.    Has the patient been a risk to self in the past 6 months? No.  Has the patient been a risk to self within the distant past? Yes.    Is the patient a risk to others? No.  Has the patient been a risk to others in the past 6 months? No.  Has the patient been a risk to others within the distant past? No.   Prior Inpatient Therapy:   Prior Outpatient Therapy:    Alcohol Screening: 1. How often do you have a drink containing alcohol?: 2 to 3 times a week 2. How many drinks containing alcohol do you have on a typical day when you are drinking?: 1 or 2 3. How often do you have six or more drinks on one occasion?: Never AUDIT-C Score: 3 4. How often during the last year have you found that you were not able to stop drinking once you had started?: Never 5. How often during the last year have you failed to do what was normally expected from you becasue of drinking?: Never 6. How often during the last year have you needed a first drink  in the morning to get yourself going after a heavy drinking session?: Never 7. How often during the last year have you had a feeling of guilt of remorse after drinking?: Never 8. How often during the last year have you been unable to remember what happened the night before because you had been drinking?: Never 9. Have you or someone else been  injured as a result of your drinking?: No 10. Has a relative or friend or a doctor or another health worker been concerned about your drinking or suggested you cut down?: No Alcohol Use Disorder Identification Test Final Score (AUDIT): 3 Intervention/Follow-up: AUDIT Score <7 follow-up not indicated Substance Abuse History in the last 12 months:  Yes.   Consequences of Substance Abuse: Blackouts:  Have occurred Withdrawal Symptoms:   Cramps Diaphoresis Diarrhea Headaches Nausea Tremors Vomiting Previous Psychotropic Medications: Yes  Psychological Evaluations: Yes  Past Medical History: History reviewed. No pertinent past medical history.  Past Surgical History:  Procedure Laterality Date  . HERNIA REPAIR     Family History: History reviewed. No pertinent family history. Family Psychiatric  History: Denied Tobacco Screening: Have you used any form of tobacco in the last 30 days? (Cigarettes, Smokeless Tobacco, Cigars, and/or Pipes): Yes Tobacco use, Select all that apply: 4 or less cigarettes per day Are you interested in Tobacco Cessation Medications?: No, patient refused Counseled patient on smoking cessation including recognizing danger situations, developing coping skills and basic information about quitting provided: Refused/Declined practical counseling Social History:  Social History   Substance and Sexual Activity  Alcohol Use Yes   Comment: Occasionally      Social History   Substance and Sexual Activity  Drug Use Yes  . Types: Marijuana, Cocaine, Heroin    Additional Social History: Marital status: Single Are you sexually active?: Yes What is your sexual orientation?: straight  Has your sexual activity been affected by drugs, alcohol, medication, or emotional stress?: yes Does patient have children?: Yes How many children?: 1(son died [id number], 81 year old daughter) How is patient's relationship with their children?: Poor relationship with daughter                          Allergies:  No Known Allergies Lab Results:  Results for orders placed or performed during the hospital encounter of 03/11/18 (from the past 48 hour(s))  Rapid urine drug screen (hospital performed)     Status: Abnormal   Collection Time: 03/11/18  6:04 PM  Result Value Ref Range   Opiates NONE DETECTED NONE DETECTED   Cocaine POSITIVE (A) NONE DETECTED   Benzodiazepines NONE DETECTED NONE DETECTED   Amphetamines NONE DETECTED NONE DETECTED   Tetrahydrocannabinol NONE DETECTED NONE DETECTED   Barbiturates NONE DETECTED NONE DETECTED    Comment: (NOTE) DRUG SCREEN FOR MEDICAL PURPOSES ONLY.  IF CONFIRMATION IS NEEDED FOR ANY PURPOSE, NOTIFY LAB WITHIN 5 DAYS. LOWEST DETECTABLE LIMITS FOR URINE DRUG SCREEN Drug Class                     Cutoff (ng/mL) Amphetamine and metabolites    1000 Barbiturate and metabolites    200 Benzodiazepine                 200 Tricyclics and metabolites     300 Opiates and metabolites        300 Cocaine and metabolites        300 THC  50 Performed at [facility], [address] [place], [place] [address]     Blood Alcohol level:  Lab Results  Component Value Date   ETH <10 03/11/2018    Metabolic Disorder Labs:  No results found for: HGBA1C, MPG No results found for: PROLACTIN No results found for: CHOL, TRIG, HDL, CHOLHDL, VLDL, LDLCALC  Current Medications: Current Facility-Administered Medications  Medication Dose Route Frequency Provider Last Rate Last Dose  . acetaminophen (TYLENOL) tablet 650 mg  650 mg Oral Q6H PRN Kerry HoughSimon, Spencer E, PA-C   650 mg at 03/13/18 [id number]  . alum & mag hydroxide-simeth (MAALOX/MYLANTA) 200-200-20 MG/5ML suspension 30 mL  30 mL Oral Q4H PRN Donell SievertSimon, Spencer E, PA-C      . feeding supplement (ENSURE ENLIVE) (ENSURE ENLIVE) liquid 237 mL  237 mL Oral BID BM Cobos, Rockey SituFernando A, MD   237 mL at 03/13/18 1425  . hydrOXYzine (ATARAX/[name]) tablet 25 mg  25 mg  Oral Q6H PRN Donell SievertSimon, Spencer E, PA-C      . magnesium hydroxide (MILK OF MAGNESIA) suspension 30 mL  30 mL Oral Daily PRN Kerry HoughSimon, Spencer E, PA-C      . sertraline (ZOLOFT) tablet 25 mg  25 mg Oral Daily Antonieta Pertlary, Kainoah Bartosiewicz Lawson, MD   25 mg at 03/13/18 [id number]  . traZODone (DESYREL) tablet 50 mg  50 mg Oral QHS,MR X 1 Simon, Spencer E, PA-C       PTA Medications: No medications prior to admission.    Musculoskeletal: Strength & Muscle Tone: within normal limits Gait & Station: normal Patient leans: N/A  Psychiatric Specialty Exam: Physical Exam  Nursing note and vitals reviewed. Constitutional: He is oriented to person, place, and time. He appears well-developed and well-nourished.  HENT:  Head: Normocephalic and atraumatic.  Respiratory: Effort normal.  Neurological: He is alert and oriented to person, place, and time.    ROS  Blood pressure 109/77, pulse 85, temperature 98.6 F (37 C), temperature source Oral, resp. rate 20, height 5\' 10"  (1.778 m), weight 63.5 kg.Body mass index is 20.09 kg/m.  General Appearance: Disheveled  Eye Contact:  Fair  Speech:  Slow  Volume:  Decreased  Mood:  Depressed  Affect:  Congruent  Thought Process:  Coherent and Descriptions of Associations: Intact  Orientation:  Full (Time, Place, and Person)  Thought Content:  Logical  Suicidal Thoughts:  Yes.  without intent/plan  Homicidal Thoughts:  No  Memory:  Immediate;   Poor Recent;   Poor Remote;   Poor  Judgement:  Impaired  Insight:  Fair  Psychomotor Activity:  Psychomotor Retardation  Concentration:  Concentration: Fair and Attention Span: Fair  Recall:  [employer]Fair  Fund of Knowledge:  Fair  Language:  Fair  Akathisia:  Negative  Handed:  Right  AIMS (if indicated):     Assets:  Desire for Improvement Resilience  ADL's:  Intact  Cognition:  WNL  Sleep:  Number of Hours: 6.75    Treatment Plan Summary: Daily contact with patient to assess and evaluate symptoms and progress in treatment,  Medication management and Plan Patient is seen and examined.  Patient is a 57 year old male with the above-stated past psychiatric history who was admitted secondary to suicidal ideation, depressive symptoms and substance abuse.  #1 depression/substance-induced mood disorder-given his history we will start him on Zoloft 25 mg p.o. daily.  #2 cocaine/heroin/alcohol/marijuana use disorders/dependence-so far his vital signs are stable.  His drug of choice has been cocaine.  No withdrawal syndrome from  that.  Continue to monitor this.  Patient will discuss with social work as options for rehabilitation program.  #3 acute renal injury-patient's creatinine is elevated to approximately 1.5.  Most likely secondary to cocaine.  Patient's vital signs are stable.  There is no protein in his urine.  Hopefully this will resolve.  #4 disposition planning-patient will discuss with social work as options for substance abuse treatment.  Observation Level/Precautions:  Detox 15 minute checks  Laboratory:  Chemistry Profile  Psychotherapy:    Medications:    Consultations:    Discharge Concerns:    Estimated LOS:  Other:     Physician Treatment Plan for Primary Diagnosis: <principal problem not specified> Long Term Goal(s): Improvement in symptoms so as ready for discharge  Short Term Goals: Ability to identify changes in lifestyle to reduce recurrence of condition will improve, Ability to verbalize feelings will improve, Ability to disclose and discuss suicidal ideas, Ability to demonstrate self-control will improve, Ability to identify and develop effective coping behaviors will improve, Ability to maintain clinical measurements within normal limits will improve and Ability to identify triggers associated with substance abuse/mental health issues will improve  Physician Treatment Plan for Secondary Diagnosis: Active Problems:   MDD (major depressive disorder), severe (HCC)   Cocaine dependence with cocaine-induced  mood disorder (HCC)  Long Term Goal(s): Improvement in symptoms so as ready for discharge  Short Term Goals: Ability to identify changes in lifestyle to reduce recurrence of condition will improve, Ability to verbalize feelings will improve, Ability to disclose and discuss suicidal ideas, Ability to demonstrate self-control will improve, Ability to identify and develop effective coping behaviors will improve, Ability to maintain clinical measurements within normal limits will improve and Ability to identify triggers associated with substance abuse/mental health issues will improve  I certify that inpatient services furnished can reasonably be expected to improve the patient's condition.    Antonieta Pert, MD 9/22/20193:39 PM

## 2018-03-13 NOTE — Progress Notes (Signed)
Patient did attend the evening speaker AA meeting.  

## 2018-03-13 NOTE — [facility] Group Notes (Signed)
[facility] Group Notes:  (Nursing/MHT/Case Management/Adjunct)  Date:  03/13/2018  Time:  4:47 PM Type of Therapy:  Psychoeducational Skills  Participation Level:  Active  Participation Quality:  Appropriate  Affect:  Appropriate  Cognitive:  Appropriate  Insight:  Appropriate  Engagement in Group:  Engaged  Modes of Intervention:  Problem-solving  Summary of Progress/Problems: Topic was on leisure and lifestyle changes.     [name] O [name] 03/13/2018, 4:47 PM 

## 2018-03-13 NOTE — [facility] Group Notes (Deleted)
Adult Psychoeducational Group Note  Date:  03/13/2018 Time:  9:25 AM  Group Topic/Focus:  Goals Group:   The focus of this group is to help patients establish daily goals to achieve during treatment and discuss how the patient can incorporate goal setting into their daily lives to aide in recovery.  Participation Level:  Active  Participation Quality:  Appropriate  Affect:  Appropriate  Cognitive:  Alert  Insight: Appropriate  Engagement in Group:  Engaged  Modes of Intervention:  Orientation  Additional Comments:  Pt participated in orientation/goals/psycho-ed group.  Dellia NimsJaquesha M Barbarita Hutmacher 03/13/2018, 9:25 AM

## 2018-03-13 NOTE — Progress Notes (Signed)
Nursing Progress Note: 7p-7a D: Pt currently presents with a anxious/sad/depressed affect and behavior. Pt states "I am fine. I really just want to be left alone. Group isn't really my style. Too many people make me nervous." Interacting minimally with the milieu. Pt reports fair sleep during the previous night with current medication regimen. Pt did attend wrap-up group.  A: Pt provided with medications per providers orders. Pt's labs and vitals were monitored throughout the night. Pt supported emotionally and encouraged to express concerns and questions. Pt educated on medications.  R: Pt's safety ensured with 15 minute and environmental checks. Pt currently denies SI, HI, and AVH. Pt verbally contracts to seek staff if SI,HI, or AVH occurs and to consult with staff before acting on any harmful thoughts. Will continue to monitor.

## 2018-03-14 ENCOUNTER — Ambulatory Visit (HOSPITAL_COMMUNITY)
Admission: RE | Admit: 2018-03-14 | Discharge: 2018-03-14 | Disposition: A | Discharge status: Home / Self Care | Payer: Self-pay | Source: Ambulatory Visit | Attending: Psychiatry | Admitting: Psychiatry

## 2018-03-14 DIAGNOSIS — R918 Other nonspecific abnormal finding of lung field: Secondary | ICD-10-CM | POA: Insufficient documentation

## 2018-03-14 MED ORDER — ADULT MULTIVITAMIN W/MINERALS CH
1.0000 | ORAL_TABLET | Freq: Every day | ORAL | Status: DC
  Administered 2018-03-14 – 2018-03-15 (×2): 1 via ORAL
  Filled 2018-03-14 (×4): qty 1

## 2018-03-14 MED ORDER — ALBUTEROL SULFATE HFA 108 (90 BASE) MCG/ACT IN AERS
2.0000 | INHALATION_SPRAY | Freq: Four times a day (QID) | RESPIRATORY_TRACT | Status: DC
  Administered 2018-03-14 – 2018-03-15 (×2): 2 via RESPIRATORY_TRACT
  Filled 2018-03-14 (×2): qty 6.7

## 2018-03-14 MED ORDER — VITAMIN B-1 100 MG PO TABS
100.0000 mg | ORAL_TABLET | Freq: Every day | ORAL | Status: DC
  Administered 2018-03-14 – 2018-03-15 (×2): 100 mg via ORAL
  Filled 2018-03-14 (×5): qty 1

## 2018-03-14 MED ORDER — SERTRALINE HCL 50 MG PO TABS
50.0000 mg | ORAL_TABLET | Freq: Every day | ORAL | Status: DC
  Administered 2018-03-15: 50 mg via ORAL
  Filled 2018-03-14: qty 14
  Filled 2018-03-14 (×2): qty 1

## 2018-03-14 NOTE — [facility] Group Notes (Signed)
Adult Psychoeducational Group Note  Date:  03/14/2018 Time:  5:57 PM  Group Topic/Focus:  Healthy Communication:   The focus of this group is to discuss communication, barriers to communication, as well as healthy ways to communicate with others.  Participation Level:  Active  Participation Quality:  Appropriate  Affect:  Appropriate  Cognitive:  Alert  Insight: Appropriate  Engagement in Group:  Engaged  Modes of Intervention:  Orientation  Additional Comments:  Pt attended and participated in group facilitated by MHT Leodis LiverpoolMichael L.  Esmae Donathan M Richerd Grime 03/14/2018, 5:57 PM

## 2018-03-14 NOTE — Progress Notes (Signed)
[facility] [phone] informed that patient will be transported by [facility] for 2 DG chest xray.  [facility] will probably arrive 3:10 pm.  [facility] informed of approximately arrival time.

## 2018-03-14 NOTE — Progress Notes (Signed)
[facility] MD Progress Note  03/14/2018 11:40 AM Richard Moon  MRN:  [id number] Subjective: Patient is seen in examined.  Patient is a 57 year old male with a past psychiatric history significant for cocaine dependence and alcohol dependence.  He seen in follow-up.  He stated he feels very bad today.  He stated that his head is "discombobulated".  We discussed the effects of coming off withdrawal from cocaine and alcohol.  He did have a cough during the interview today, and he stated he was coughing up yellow sputum.  On lung examination he did not have any wheezing.  We discussed getting a chest x-ray today as well as starting some albuterol for cough.  He is still interested in some residential substance abuse treatment program.  He also has used heroin in the past.  His white blood cell count on admission was normal.  His MCV was a little high.  His liver function enzymes were normal on admission.  His creatinine was mildly elevated at 1.49.  His EKG showed an incomplete right bundle branch block. Principal Problem: <principal problem not specified> Diagnosis:   Patient Active Problem List   Diagnosis Date Noted  . Cocaine dependence with cocaine-induced mood disorder (HCC) [F14.24]   . MDD (major depressive disorder), severe (HCC) [F32.2] 03/12/2018   Total Time spent with patient: 20 minutes  Past Psychiatric History: See admission H&P  Past Medical History: History reviewed. No pertinent past medical history.  Past Surgical History:  Procedure Laterality Date  . HERNIA REPAIR     Family History: History reviewed. No pertinent family history. Family Psychiatric  History: See admission H&P Social History:  Social History   Substance and Sexual Activity  Alcohol Use Yes   Comment: Occasionally      Social History   Substance and Sexual Activity  Drug Use Yes  . Types: Marijuana, Cocaine, Heroin    Social History   Socioeconomic History  . Marital status: Single    Spouse name:  Not on file  . Number of children: Not on file  . Years of education: Not on file  . Highest education level: Not on file  Occupational History  . Not on file  Social Needs  . Financial resource strain: Not on file  . Food insecurity:    Worry: Not on file    Inability: Not on file  . Transportation needs:    Medical: Not on file    Non-medical: Not on file  Tobacco Use  . Smoking status: Current Some Day Smoker    Packs/day: 0.25    Years: 20.00    Pack years: 5.00    Types: Cigarettes  . Smokeless tobacco: Never Used  Substance and Sexual Activity  . Alcohol use: Yes    Comment: Occasionally   . Drug use: Yes    Types: Marijuana, Cocaine, Heroin  . Sexual activity: Yes    Birth control/protection: Condom  Lifestyle  . Physical activity:    Days per week: Not on file    Minutes per session: Not on file  . Stress: Not on file  Relationships  . Social connections:    Talks on phone: Not on file    Gets together: Not on file    Attends religious service: Not on file    Active member of club or organization: Not on file    Attends meetings of clubs or organizations: Not on file    Relationship status: Not on file  Other Topics Concern  .  Not on file  Social History Narrative  . Not on file   Additional Social History:                         Sleep: Fair  Appetite:  Fair  Current Medications: Current Facility-Administered Medications  Medication Dose Route Frequency Provider Last Rate Last Dose  . acetaminophen (TYLENOL) tablet 650 mg  650 mg Oral Q6H PRN Kerry HoughSimon, Spencer E, PA-C   650 mg at 03/13/18 [id number]  . albuterol (PROVENTIL HFA;VENTOLIN HFA) 108 (90 Base) MCG/ACT inhaler 2 puff  2 puff Inhalation Q6H Clary, Marlane MingleGreg Lawson, MD      . alum & mag hydroxide-simeth (MAALOX/MYLANTA) 200-200-20 MG/5ML suspension 30 mL  30 mL Oral Q4H PRN Donell SievertSimon, Spencer E, PA-C      . feeding supplement (ENSURE ENLIVE) (ENSURE ENLIVE) liquid 237 mL  237 mL Oral BID BM Cobos,  Rockey SituFernando A, MD   237 mL at 03/14/18 1122  . hydrOXYzine (ATARAX/[name]) tablet 25 mg  25 mg Oral Q6H PRN Donell SievertSimon, Spencer E, PA-C      . magnesium hydroxide (MILK OF MAGNESIA) suspension 30 mL  30 mL Oral Daily PRN Kerry HoughSimon, Spencer E, PA-C      . multivitamin with minerals tablet 1 tablet  1 tablet Oral Daily Cobos, Rockey SituFernando A, MD   1 tablet at 03/14/18 1121  . [START ON 03/15/2018] sertraline (ZOLOFT) tablet 50 mg  50 mg Oral Daily Antonieta Pertlary, Greg Lawson, MD      . traZODone (DESYREL) tablet 50 mg  50 mg Oral QHS,MR X 1 Kerry HoughSimon, Spencer E, PA-C   50 mg at 03/13/18 2250    Lab Results: No results found for this or any previous visit (from the past 48 hour(s)).  Blood Alcohol level:  Lab Results  Component Value Date   ETH <10 03/11/2018    Metabolic Disorder Labs: No results found for: HGBA1C, MPG No results found for: PROLACTIN No results found for: CHOL, TRIG, HDL, CHOLHDL, VLDL, LDLCALC  Physical Findings: AIMS: Facial and Oral Movements Muscles of Facial Expression: None, normal Lips and Perioral Area: None, normal Jaw: None, normal Tongue: None, normal,Extremity Movements Upper (arms, wrists, hands, fingers): None, normal Lower (legs, knees, ankles, toes): None, normal, Trunk Movements Neck, shoulders, hips: None, normal, Overall Severity Severity of abnormal movements (highest score from questions above): None, normal Incapacitation due to abnormal movements: None, normal Patient's awareness of abnormal movements (rate only patient's report): No Awareness, Dental Status Current problems with teeth and/or dentures?: No Does patient usually wear dentures?: No  CIWA:  CIWA-Ar Total: 0 COWS:     Musculoskeletal: Strength & Muscle Tone: within normal limits Gait & Station: normal Patient leans: N/A  Psychiatric Specialty Exam: Physical Exam  Nursing note and vitals reviewed. Constitutional: He is oriented to person, place, and time. He appears well-developed and well-nourished.   HENT:  Head: Normocephalic and atraumatic.  Respiratory: Effort normal and breath sounds normal.  Neurological: He is alert and oriented to person, place, and time.    ROS  Blood pressure 109/77, pulse 85, temperature 98.6 F (37 C), temperature source Oral, resp. rate 20, height 5\' 10"  (1.778 m), weight 63.5 kg.Body mass index is 20.09 kg/m.  General Appearance: Disheveled  Eye Contact:  Fair  Speech:  Slow  Volume:  Decreased  Mood:  Depressed  Affect:  Congruent  Thought Process:  Coherent and Descriptions of Associations: Intact  Orientation:  Full (Time, Place, and Person)  Thought  Content:  Logical  Suicidal Thoughts:  No  Homicidal Thoughts:  No  Memory:  Immediate;   Fair Recent;   Fair Remote;   Fair  Judgement:  Intact  Insight:  Fair  Psychomotor Activity:  Decreased and Psychomotor Retardation  Concentration:  Concentration: Fair and Attention Span: Fair  Recall:  [employer] of Knowledge:  Fair  Language:  Fair  Akathisia:  Negative  Handed:  Right  AIMS (if indicated):     Assets:  Desire for Improvement Resilience  ADL's:  Intact  Cognition:  WNL  Sleep:  Number of Hours: 5.75     Treatment Plan Summary: Daily contact with patient to assess and evaluate symptoms and progress in treatment, Medication management and Plan : Patient is seen and examined.  Patient is a 57 year old male with the above-stated past psychiatric history who is seen in follow-up.  #1 major depression versus substance-induced mood disorder-increase sertraline to 50 mg p.o. daily for mood.  #2 productive cough-patient had a productive cough in the emergency room, but chest x-ray was not done.  He stated he is coughing up yellow sputum.  He is a smoker.  We will get a chest x-ray today.  His white count was normal on admission.  He has used heroin in the past, and I will check an HIV as well as hepatitis panel.  #3 cocaine and alcohol dependence-patient is seeking residential substance  abuse treatment.  He will meet with social work today.  #4 disposition-patient is seeking residential substance abuse treatment.  He will meet with social work today, and much of that will determine length of stay.  Antonieta Pert, MD 03/14/2018, 11:40 AM

## 2018-03-14 NOTE — Tx Team (Signed)
Interdisciplinary Treatment and Diagnostic Plan Update  03/14/2018 Time of Session: 0830AM Richard Moon MRN: [id number]  Principal Diagnosis: MDD, recurrent, severe Secondary Diagnoses: Active Problems:   MDD (major depressive disorder), severe (HCC)   Cocaine dependence with cocaine-induced mood disorder (HCC)   Current Medications:  Current Facility-Administered Medications  Medication Dose Route Frequency Provider Last Rate Last Dose  . acetaminophen (TYLENOL) tablet 650 mg  650 mg Oral Q6H PRN Laverle Hobby, PA-C   650 mg at 03/13/18 6415  . alum & mag hydroxide-simeth (MAALOX/MYLANTA) 200-200-20 MG/5ML suspension 30 mL  30 mL Oral Q4H PRN Patriciaann Clan E, PA-C      . feeding supplement (ENSURE ENLIVE) (ENSURE ENLIVE) liquid 237 mL  237 mL Oral BID BM Cobos, Myer Peer, MD   237 mL at 03/13/18 1425  . hydrOXYzine (ATARAX/[name]) tablet 25 mg  25 mg Oral Q6H PRN Patriciaann Clan E, PA-C      . magnesium hydroxide (MILK OF MAGNESIA) suspension 30 mL  30 mL Oral Daily PRN Laverle Hobby, PA-C      . sertraline (ZOLOFT) tablet 25 mg  25 mg Oral Daily Sharma Covert, MD   25 mg at 03/14/18 0831  . traZODone (DESYREL) tablet 50 mg  50 mg Oral QHS,MR X 1 Patriciaann Clan E, PA-C   50 mg at 03/13/18 2250   PTA Medications: No medications prior to admission.    Patient Stressors: Loss of son Marital or family conflict Medication change or noncompliance Substance abuse  Patient Strengths: Curator fund of knowledge Motivation for treatment/growth  Treatment Modalities: Medication Management, Group therapy, Case management,  1 to 1 session with clinician, Psychoeducation, Recreational therapy.   Physician Treatment Plan for Primary Diagnosis: MDD, recurrent, severe Long Term Goal(s): Improvement in symptoms so as ready for discharge Improvement in symptoms so as ready for discharge   Short Term Goals: Ability to identify changes in lifestyle to  reduce recurrence of condition will improve Ability to verbalize feelings will improve Ability to disclose and discuss suicidal ideas Ability to demonstrate self-control will improve Ability to identify and develop effective coping behaviors will improve Ability to maintain clinical measurements within normal limits will improve Ability to identify triggers associated with substance abuse/mental health issues will improve Ability to identify changes in lifestyle to reduce recurrence of condition will improve Ability to verbalize feelings will improve Ability to disclose and discuss suicidal ideas Ability to demonstrate self-control will improve Ability to identify and develop effective coping behaviors will improve Ability to maintain clinical measurements within normal limits will improve Ability to identify triggers associated with substance abuse/mental health issues will improve  Medication Management: Evaluate patient's response, side effects, and tolerance of medication regimen.  Therapeutic Interventions: 1 to 1 sessions, Unit Group sessions and Medication administration.  Evaluation of Outcomes: Not Met  Physician Treatment Plan for Secondary Diagnosis: Active Problems:   MDD (major depressive disorder), severe (HCC)   Cocaine dependence with cocaine-induced mood disorder ([place])  Long Term Goal(s): Improvement in symptoms so as ready for discharge Improvement in symptoms so as ready for discharge   Short Term Goals: Ability to identify changes in lifestyle to reduce recurrence of condition will improve Ability to verbalize feelings will improve Ability to disclose and discuss suicidal ideas Ability to demonstrate self-control will improve Ability to identify and develop effective coping behaviors will improve Ability to maintain clinical measurements within normal limits will improve Ability to identify triggers associated with substance abuse/mental health issues will  improve Ability to identify changes in lifestyle to reduce recurrence of condition will improve Ability to verbalize feelings will improve Ability to disclose and discuss suicidal ideas Ability to demonstrate self-control will improve Ability to identify and develop effective coping behaviors will improve Ability to maintain clinical measurements within normal limits will improve Ability to identify triggers associated with substance abuse/mental health issues will improve     Medication Management: Evaluate patient's response, side effects, and tolerance of medication regimen.  Therapeutic Interventions: 1 to 1 sessions, Unit Group sessions and Medication administration.  Evaluation of Outcomes: Not Met   RN Treatment Plan for Primary Diagnosis: MDD, recurrent, severe Long Term Goal(s): Knowledge of disease and therapeutic regimen to maintain health will improve  Short Term Goals: Ability to remain free from injury will improve, Ability to verbalize frustration and anger appropriately will improve, Ability to verbalize feelings will improve and Ability to disclose and discuss suicidal ideas  Medication Management: RN will administer medications as ordered by provider, will assess and evaluate patient's response and provide education to patient for prescribed medication. RN will report any adverse and/or side effects to prescribing provider.  Therapeutic Interventions: 1 on 1 counseling sessions, Psychoeducation, Medication administration, Evaluate responses to treatment, Monitor vital signs and CBGs as ordered, Perform/monitor CIWA, COWS, AIMS and Fall Risk screenings as ordered, Perform wound care treatments as ordered.  Evaluation of Outcomes: Not Met   LCSW Treatment Plan for Primary Diagnosis: MDD, recurrent, severe Long Term Goal(s): Safe transition to appropriate next level of care at discharge, Engage patient in therapeutic group addressing interpersonal concerns.  Short Term  Goals: Engage patient in aftercare planning with referrals and resources, Facilitate patient progression through stages of change regarding substance use diagnoses and concerns and Identify triggers associated with mental health/substance abuse issues  Therapeutic Interventions: Assess for all discharge needs, 1 to 1 time with Social worker, Explore available resources and support systems, Assess for adequacy in community support network, Educate family and significant other(s) on suicide prevention, Complete Psychosocial Assessment, Interpersonal group therapy.  Evaluation of Outcomes: Not Met   Progress in Treatment: Attending groups: No. New to unit. Continuing to assess.  Participating in groups: No. Taking medication as prescribed: Yes. Toleration medication: Yes. Family/Significant other contact made: SPE completed with pt; pt declined to consent to collateral contact.  Patient understands diagnosis: Yes. Discussing patient identified problems/goals with staff: Yes. Medical problems stabilized or resolved: Yes. Denies suicidal/homicidal ideation: Yes. Issues/concerns per patient self-inventory: No. Other: n/a   New problem(s) identified: No, Describe:  n/a  New Short Term/Long Term Goal(s): detox, medication management for mood stabilization; elimination of SI thoughts; development of comprehensive mental wellness/sobriety plan.   Patient Goals:  "I want to figure out what's going on with my mental health and possibly get into a long term program."  Discharge Plan or Barriers: CSW assessing for appropriate referrals. He expressed interest in long term substance abuse program. [place] pamphlet, [facility] Crisis information, and AA/NA information provided to patient for additional community support and resources.   Reason for Continuation of Hospitalization: Anxiety Depression Medication stabilization Withdrawal symptoms  Estimated Length of Stay: Thursday,  03/17/18  Attendees: Patient: 03/14/2018 8:43 AM  Physician: Dr. Mallie Darting MD; Dr. Nancy Fetter MD 03/14/2018 8:43 AM  Nursing: Rise Paganini RN; [facility] RN 03/14/2018 8:43 AM  RN Care Manager:x 03/14/2018 8:43 AM  Social Worker: Janice Norrie LCSW 03/14/2018 8:43 AM  Recreational Therapist: x 03/14/2018 8:43 AM  Other: Ricky Ala NP 03/14/2018 8:43 AM  Other:  03/14/2018 8:43 AM  Other: 03/14/2018 8:43 AM    Scribe for Treatment Team: Avelina Laine, LCSW 03/14/2018 8:43 AM

## 2018-03-14 NOTE — Plan of Care (Signed)
Nurse discussed anxiety, depression, coping skills with patient. 

## 2018-03-14 NOTE — Progress Notes (Signed)
NUTRITION ASSESSMENT  Pt identified as at risk on the Malnutrition Screen Tool  INTERVENTION: Supplements:  - continue Ensure Enlive BID, each supplement provides 350 kcal and 20 grams of protein. - will order daily multivitamin with minerals.   NUTRITION DIAGNOSIS: Unintentional weight loss related to sub-optimal intake as evidenced by pt report.   Goal: Pt to meet >/= 90% of their estimated nutrition needs.  Monitor:  PO intake  Assessment:  Patient admitted with request for drug detox. He has been using heroin, marijuana, and crack for "a long time" and that he last used drugs the day PTA (9/20). Patient reported SI and that he planned to jump off of a bridge. Patient reported severe depression with associated symptoms including decreased sleep and decreased appetite.   Limited weight hx available. Per chart, it appears that patient has lost 4.5 kg/10 lb (6.7% body weight) but suspect that this did not occur over 1 day time frame as it appears in the chart. Unsure of time frame for weight loss.   Continue to encourage PO intakes of meals, supplements, and snacks.     57 y.o. male  Height: Ht Readings from Last 1 Encounters:  03/12/18 5\' 10"  (1.778 m)    Weight: Wt Readings from Last 1 Encounters:  03/12/18 63.5 kg    Weight Hx: Wt Readings from Last 10 Encounters:  03/12/18 63.5 kg  03/11/18 68 kg  12/17/16 86.2 kg    BMI:  Body mass index is 20.09 kg/m. Pt meets criteria for normal weight based on current BMI.  Estimated Nutritional Needs: Kcal: 25-30 kcal/kg Protein: > 1 gram protein/kg Fluid: 1 ml/kcal  Diet Order:  Diet Order            Diet regular Room service appropriate? Yes; Fluid consistency: Thin  Diet effective now             Pt is also offered choice of unit snacks mid-morning and mid-afternoon.  Pt is eating as desired.   Lab results and medications reviewed.      Trenton GammonJessica Brian Kocourek, MS, RD, LDN, [facility] Inpatient Clinical  Dietitian Pager # [phone] After hours/weekend pager # [phone]

## 2018-03-14 NOTE — Progress Notes (Signed)
Recreation Therapy Notes  Date: 9.23.19 Time: 0930 Location: [address] Dayroom  Group Topic: Stress Management  Goal Area(s) Addresses:  Patient will verbalize importance of using healthy stress management.  Patient will identify positive emotions associated with healthy stress management.   Behavioral Response: Engaged  Intervention: Stress Management  Activity :  Guided Imagery.  LRT introduced the stress management technique of guided imagery.  LRT read a script to guide patients on a journey to their peaceful place.  Patients were to follow along as script was read.  Education:  Stress Management, Discharge Planning.   Education Outcome: Acknowledges edcuation/In group clarification offered/Needs additional education  Clinical Observations/Feedback: Pt attended and participated in group.      Caroll RancherMarjette Tauri Ethington, LRT/CTRS         Lillia AbedLindsay, Zyairah Wacha A 03/14/2018 11:59 AM

## 2018-03-14 NOTE — [facility] Group Notes (Signed)
LCSW Group Therapy Note   03/14/2018 1:15pm   Type of Therapy and Topic:  Group Therapy:  Overcoming Obstacles   Participation Level:  Did Not Attend--pt invited. Chose to remain in bed.    Description of Group:    In this group patients will be encouraged to explore what they see as obstacles to their own wellness and recovery. They will be guided to discuss their thoughts, feelings, and behaviors related to these obstacles. The group will process together ways to cope with barriers, with attention given to specific choices patients can make. Each patient will be challenged to identify changes they are motivated to make in order to overcome their obstacles. This group will be process-oriented, with patients participating in exploration of their own experiences as well as giving and receiving support and challenge from other group members.   Therapeutic Goals: 1. Patient will identify personal and current obstacles as they relate to admission. 2. Patient will identify barriers that currently interfere with their wellness or overcoming obstacles.  3. Patient will identify feelings, thought process and behaviors related to these barriers. 4. Patient will identify two changes they are willing to make to overcome these obstacles:      Summary of Patient Progress   x   Therapeutic Modalities:   Cognitive Behavioral Therapy Solution Focused Therapy Motivational Interviewing Relapse Prevention Therapy  Rona RavensHeather S Jazlyn Tippens, LCSW 03/14/2018 2:48 PM

## 2018-03-14 NOTE — Progress Notes (Signed)
Nursing Progress Note: 7p-7a D: Pt currently presents with an anxious affect and behavior. Interacting cautiously with the milieu. Pt reports fair sleep during the previous night with current medication regimen. Pt did attend wrap-up group.  A: Pt provided with medications per providers orders. Pt's labs and vitals were monitored throughout the night. Pt supported emotionally and encouraged to express concerns and questions. Pt educated on medications.  R: Pt's safety ensured with 15 minute and environmental checks. Pt currently denies SI, HI, and AVH. Pt verbally contracts to seek staff if SI,HI, or AVH occurs and to consult with staff before acting on any harmful thoughts. Will continue to monitor.

## 2018-03-14 NOTE — Progress Notes (Signed)
[facility]  [facility] accompanied [facility] to [facility].

## 2018-03-14 NOTE — [facility] Group Notes (Signed)
Adult Psychoeducational Group Note  Date:  03/14/2018 Time:  9:33 AM  Group Topic/Focus:  Goals Group:   The focus of this group is to help patients establish daily goals to achieve during treatment and discuss how the patient can incorporate goal setting into their daily lives to aide in recovery.  Participation Level:  Active  Participation Quality:  Appropriate  Affect:  Appropriate  Cognitive:  Alert  Insight: Appropriate  Engagement in Group:  Engaged  Modes of Intervention:  Orientation  Additional Comments:  Pt attended orientation/goals group facilitated by [facility] AJ.  Bricen Victory M Lorali Khamis 03/14/2018, 9:33 AM 

## 2018-03-14 NOTE — Progress Notes (Signed)
[facility]

## 2018-03-14 NOTE — Progress Notes (Signed)
D:  Patient denied SI and HI, contracts for safety.  Denied A/V hallucinations.   A:  Medications administered per MD orders.  Emotional support and encouragement given patient. R:  Safety maintained with 15 minute checks.  

## 2018-03-15 LAB — HEPATITIS PANEL, ACUTE
HCV Ab: <0.1 s/co ratio (ref 0.0–0.9)
HEP A IGM: NEGATIVE
HEP B C IGM: NEGATIVE
HEP B S AG: NEGATIVE

## 2018-03-15 MED ORDER — QUETIAPINE FUMARATE 25 MG PO TABS: 25.0000 mg | ORAL_TABLET | Freq: Every day | ORAL | 0 refills | Status: DC

## 2018-03-15 MED ORDER — TRAZODONE HCL 50 MG PO TABS: 50.0000 mg | ORAL_TABLET | Freq: Every evening | ORAL | 0 refills | Status: DC | PRN

## 2018-03-15 MED ORDER — GUAIFENESIN-DM 100-10 MG/5ML PO SYRP: 5.0000 mL | ORAL_SOLUTION | ORAL | Status: DC | PRN

## 2018-03-15 MED ORDER — SERTRALINE HCL 50 MG PO TABS: 50.0000 mg | ORAL_TABLET | Freq: Every day | ORAL | 0 refills | Status: DC

## 2018-03-15 MED ORDER — GUAIFENESIN ER 600 MG PO TB12
600.0000 mg | ORAL_TABLET | Freq: Two times a day (BID) | ORAL | Status: DC
  Administered 2018-03-15 (×2): 600 mg via ORAL
  Filled 2018-03-15 (×5): qty 1

## 2018-03-15 MED ORDER — ALBUTEROL SULFATE HFA 108 (90 BASE) MCG/ACT IN AERS: 2.0000 | INHALATION_SPRAY | Freq: Four times a day (QID) | RESPIRATORY_TRACT | 0 refills | Status: DC

## 2018-03-15 MED ORDER — HYDROXYZINE HCL 25 MG PO TABS: 25.0000 mg | ORAL_TABLET | Freq: Four times a day (QID) | ORAL | 0 refills | Status: DC | PRN

## 2018-03-15 MED ORDER — QUETIAPINE FUMARATE 25 MG PO TABS
25.0000 mg | ORAL_TABLET | Freq: Every day | ORAL | Status: DC
  Administered 2018-03-15: 25 mg via ORAL
  Filled 2018-03-15: qty 14
  Filled 2018-03-15: qty 1

## 2018-03-15 NOTE — [facility] Group Notes (Signed)
Pt was invited but did not attend orientation/goals group. 

## 2018-03-15 NOTE — Progress Notes (Signed)
D:  Patient's self inventory sheet, patient has poor sleep, sleep medication not helpful.  Good appetite, low energy level, poor concentration.  Rated depression 7, hopeless 5, anxiety 8.  Withdrawals, chilling, runny nose.  SI, then denied SI, no plan, contracts for safety.  Denied physical problems.   Denied physical pain.  No goal today.  Stated he feels better, going to groups.  [facility] A:  Medications administered per MD orders.  Emotional support and encouragement given patient. R:  Denied SI and HI, contracts for safety.  Denied A/V hallucinations.  Safety maintained with 15 minute checks.

## 2018-03-15 NOTE — Progress Notes (Signed)
  [facility] :  Will you be returning to the same living situation after discharge:  No. Patient has screening for [facility].  At discharge, do you have transportation home?: Yes,  taxi voucher. PATIENT MUST DISCHARGE BY 7:00AM ON WED, 9/25 IN ORDER TO GET TO [facility] SCREENING ON TIME. Do you have the ability to pay for your medications: Yes,  mental health  Release of information consent forms completed and submitted to medical records by CSW.   Patient to Follow up at: Follow-up Information    Services, [facility] Follow up on 03/16/2018.   Why:  Screening for possible admission on Wed, 03/16/18 at 7:45AM. Please bring: photo ID/proof of [place] residency, 14 day supply of medications provided by the hospital, and clothing. Thank you.  Contact information: [address] [place] [address] [phone]        [name] Follow up.   Specialty:  Behavioral Health Why:  Walk in within 3 days of hospital/rehab discharge to be assessed for outpatient mental health services including medication management and therapy. Walk in hours: Mon-Fri 8am-10am. Thank you.  Contact information: [address] [place] [address] [phone]           Next level of care provider has access to [facility] Link:no  Safety Planning and Suicide Prevention discussed: Yes,  SPE completed with pt; pt declined to consent to collateral contact. SPI pamphlet and Mobile Crisis information provided.   Have you used any form of tobacco in the last 30 days? (Cigarettes, Smokeless Tobacco, Cigars, and/or Pipes): Yes  Has patient been referred to the Quitline?: Patient refused referral  Patient has been referred for addiction treatment: Yes  Rona RavensHeather S Rashan Patient, LCSW 03/15/2018, 8:36 AM

## 2018-03-15 NOTE — BHH Group Notes (Signed)
[facility] Group Therapy      03/15/2018 3:09 PM  Type of Therapy: Mental Health Association Presentation  Participation Level: Invited, chose not to attend.    Alcario DroughtJolan Sevana Grandinetti LCSWA Clinical Social Worker

## 2018-03-15 NOTE — BHH Suicide Risk Assessment (Signed)
[facility]   Principal Problem: MDD (major depressive disorder), severe [facility](HCC) Discharge Diagnoses:  Patient Active Problem List   Diagnosis Date Noted  . Cocaine dependence with cocaine-induced mood disorder (HCC) [F14.24]   . MDD (major depressive disorder), severe (HCC) [F32.2] 03/12/2018    Total Time spent with patient: 15 minutes  Musculoskeletal: Strength & Muscle Tone: within normal limits Gait & Station: normal Patient leans: N/A  Psychiatric Specialty Exam: Review of Systems  All other systems reviewed and are negative.   Blood pressure 90/64, pulse 96, temperature 97.9 F (36.6 C), temperature source Oral, resp. rate 16, height 5\' 10"  (1.778 m), weight 63.5 kg.Body mass index is 20.09 kg/m.  General Appearance: Casual  Eye Contact::  Fair  Speech:  Normal Rate409  Volume:  Normal  Mood:  Euthymic  Affect:  Congruent  Thought Process:  Coherent and Descriptions of Associations: Intact  Orientation:  Full (Time, Place, and Person)  Thought Content:  Logical  Suicidal Thoughts:  No  Homicidal Thoughts:  No  Memory:  Immediate;   Fair Recent;   Fair Remote;   Fair  Judgement:  Intact  Insight:  Fair  Psychomotor Activity:  Normal  Concentration:  Fair  Recall:  [employer]Fair  Fund of Knowledge:Fair  Language: Fair  Akathisia:  Negative  Handed:  Right  AIMS (if indicated):     Assets:  Communication Skills Desire for Improvement Physical Health Resilience Social Support  Sleep:  Number of Hours: 6  Cognition: WNL  ADL's:  Intact   Mental Status Per Nursing Assessment::   On Admission:  Suicidal ideation indicated by patient, Suicide plan, Self-harm thoughts  Demographic Factors:  Male, Divorced or widowed and Unemployed  Loss Factors: NA  Historical Factors: Impulsivity  Risk Reduction Factors:   NA  Continued Clinical Symptoms:  Depression:   Comorbid alcohol abuse/dependence Impulsivity Alcohol/Substance  Abuse/Dependencies  Cognitive Features That Contribute To Risk:  None    Suicide Risk:  Minimal: No identifiable suicidal ideation.  Patients presenting with no risk factors but with morbid ruminations; may be classified as minimal risk based on the severity of the depressive symptoms  Follow-up Information    [facility], [facility] Follow up on 03/16/2018.   Why:  Screening for possible admission on Wed, 03/16/18 at 7:45AM. Please bring: photo ID/proof of [place] residency, 14 day supply of medications provided by the hospital, and clothing. Thank you.  Contact information: [address] [place] [address] [phone]        [name] Follow up.   Specialty:  Behavioral Health Why:  Walk in within 3 days of hospital/rehab discharge to be assessed for outpatient mental health services including medication management and therapy. Walk in hours: Mon-Fri 8am-10am. Thank you.  Contact information: [address] [place] [address] [phone]           Plan Of Care/Follow-up recommendations:  Activity:  ad lib  Antonieta PertGreg Lawson Quaneshia Wareing, MD 03/15/2018, 2:59 PM

## 2018-03-15 NOTE — Progress Notes (Signed)
Nursing Progress Note: 7p-7a D: Pt currently presents with a pleasant/anxious affect and behavior. Pt states "I am worried about [facility], but I guess I am as ready as I can be." Interacting appropriately with the milieu. Pt reports good sleep during the previous night with current medication regimen. Pt did attend wrap-up group.  A: Pt provided with medications per providers orders. Pt's labs and vitals were monitored throughout the night. Pt supported emotionally and encouraged to express concerns and questions. Pt educated on medications.  R: Pt's safety ensured with 15 minute and environmental checks. Pt currently denies SI, HI, and AVH. Pt verbally contracts to seek staff if SI,HI, or AVH occurs and to consult with staff before acting on any harmful thoughts. Will continue to monitor.

## 2018-03-15 NOTE — BHH Group Notes (Signed)
BHH Group Notes:  (Nursing/MHT/Case Management/Adjunct)  Date:  03/15/2018  Time:  4:00 pm  Type of Therapy:  Psychoeducational Skills  Participation Level:  Active  Participation Quality:  Appropriate  Affect:  Appropriate  Cognitive:  Appropriate  Insight:  Appropriate  Engagement in Group:  Engaged  Modes of Intervention:  Education  Summary of Progress/Problems:  Patient attended group with good insight.   Earline MayotteKnight, Mckay Brandt Shephard 03/15/2018, 5:41 PM

## 2018-03-15 NOTE — Discharge Summary (Signed)
Physician Discharge Summary Note  Patient:  Richard Moon is an 57 y.o., male MRN:  [id number] DOB:  1961-04-11 Patient phone:  [phone] (home)  Patient address:   [address] [place] [address],  Total Time spent with patient: 20 minutes  Date of Admission:  03/12/2018 Date of Discharge: 03/16/18  Reason for Admission:  Worsening depression with SI and substance abuse  Principal Problem: MDD (major depressive disorder), severe [facility]) Discharge Diagnoses: Patient Active Problem List   Diagnosis Date Noted  . Cocaine dependence with cocaine-induced mood disorder (HCC) [F14.24]   . MDD (major depressive disorder), severe (HCC) [F32.2] 03/12/2018    Past Psychiatric History: Patient had one previous psychiatric admission approximately 10 years ago.  He was cutting his wrist and it was a suicide attempt.  He also has been in substance abuse treatment in the past  Past Medical History: History reviewed. No pertinent past medical history.  Past Surgical History:  Procedure Laterality Date  . HERNIA REPAIR     Family History: History reviewed. No pertinent family history. Family Psychiatric  History: Denies Social History:  Social History   Substance and Sexual Activity  Alcohol Use Yes   Comment: Occasionally      Social History   Substance and Sexual Activity  Drug Use Yes  . Types: Marijuana, Cocaine, Heroin    Social History   Socioeconomic History  . Marital status: Single    Spouse name: Not on file  . Number of children: Not on file  . Years of education: Not on file  . Highest education level: Not on file  Occupational History  . Not on file  Social Needs  . Financial resource strain: Not on file  . Food insecurity:    Worry: Not on file    Inability: Not on file  . Transportation needs:    Medical: Not on file    Non-medical: Not on file  Tobacco Use  . Smoking status: Current Some Day Smoker    Packs/day: 0.25    Years: 20.00    Pack years:  5.00    Types: Cigarettes  . Smokeless tobacco: Never Used  Substance and Sexual Activity  . Alcohol use: Yes    Comment: Occasionally   . Drug use: Yes    Types: Marijuana, Cocaine, Heroin  . Sexual activity: Yes    Birth control/protection: Condom  Lifestyle  . Physical activity:    Days per week: Not on file    Minutes per session: Not on file  . Stress: Not on file  Relationships  . Social connections:    Talks on phone: Not on file    Gets together: Not on file    Attends religious service: Not on file    Active member of club or organization: Not on file    Attends meetings of clubs or organizations: Not on file    Relationship status: Not on file  Other Topics Concern  . Not on file  Social History Narrative  . Not on file    Hospital Course:   03/13/18 [name] Assessment: 57 year old male with a past psychiatric history significant for cocaine use disorder/cocaine dependence and alcohol use disorder. Patient presented to the [name] on 9/20 with suicidal ideation. The patient reported that he is used substances for years, and has been using daily for the last 3 weeks. He has been feeling frustrated and hopeless over his inability to stop using drugs. He had been walking over  a bridge on 9/20 and was contemplating jumping off. He admitted to depressive symptoms including crying spells, social withdrawal, lack of interest in usual pleasures, fatigue. He reported a history of previous suicide attempt by cutting his wrist. He stated this occurred 10 years ago, and he was not hospitalized at that time. He also admitted using marijuana, alcohol and infrequently heroin. Unfortunately his son died by suicide in the past, and its near that anniversary. That is been bothersome to him. He also stated his mother died somewhere around this time.Patient has a history of having one previous rehabilitation stay. [facility].  He was admitted to the hospital for evaluation and stabilization.  [name]. The patient stabilized on medication and therapy. Patient was discharged on [name] 25 mg Q6H PRN, Seroquel 25 mg QHS, Zoloft 100 mg Daily.. Patient has shown improvement with improved mood, affect, sleep, appetite, and interaction. Patient has attended group and participated. Patient has been seen in the day room interacting with peers and staff appropriately. Patient denies any SI/HI/AVH and contracts for safety. Patient agrees [name]. Patient is provided with prescriptions for their medications upon discharge.   Physical Findings: AIMS: Facial and Oral Movements Muscles of Facial Expression: None, normal Lips and Perioral Area: None, normal Jaw: None, normal Tongue: None, normal,Extremity Movements Upper (arms, wrists, hands, fingers): None, normal Lower (legs, knees, ankles, toes): None, normal, Trunk Movements Neck, shoulders, hips: None, normal, Overall Severity Severity of abnormal movements (highest score from questions above): None, normal Incapacitation due to abnormal movements: None, normal Patient's awareness of abnormal movements (rate only patient's report): No Awareness, Dental Status Current problems with teeth and/or dentures?: No Does patient usually wear dentures?: No  CIWA:  CIWA-Ar Total: 1 COWS:  COWS Total Score: 2  Musculoskeletal: Strength & Muscle Tone: within normal limits Gait & Station: normal Patient leans: N/A  Psychiatric Specialty Exam: Physical Exam  Nursing note and vitals reviewed. Constitutional: He is oriented to person, place, and time. He appears well-developed and well-nourished.  Cardiovascular: Normal rate.  Respiratory: Effort normal.  Musculoskeletal: Normal range of motion.  Neurological: He is alert and oriented to person, place, and time.  Skin: Skin is warm.    Review of Systems   Constitutional: Negative.   HENT: Negative.   Eyes: Negative.   Respiratory: Negative.   Cardiovascular: Negative.   Gastrointestinal: Negative.   Genitourinary: Negative.   Musculoskeletal: Negative.   Skin: Negative.   Neurological: Negative.   Endo/Heme/Allergies: Negative.   Psychiatric/Behavioral: Negative.     Blood pressure 90/64, pulse 96, temperature 97.9 F (36.6 C), temperature source Oral, resp. rate 16, height 5\' 10"  (1.778 m), weight 63.5 kg.Body mass index is 20.09 kg/m.  General Appearance: Casual  Eye Contact:  Good  Speech:  Clear and Coherent and Normal Rate  Volume:  Normal  Mood:  Euthymic  Affect:  Congruent  Thought Process:  Goal Directed and Descriptions of Associations: Intact  Orientation:  Full (Time, Place, and Person)  Thought Content:  WDL  Suicidal Thoughts:  No  Homicidal Thoughts:  No  Memory:  Immediate;   Good Recent;   Good Remote;   Good  Judgement:  Fair  Insight:  Fair  Psychomotor Activity:  Normal  Concentration:  Concentration: Fair and Attention Span: Fair  Recall:  FiservFair  Fund of Knowledge:  Fair  Language:  Good  Akathisia:  No  Handed:  Right  AIMS (if indicated):     Assets:  Communication Skills Desire for Improvement Physical Health Social Support Transportation  ADL's:  Intact  Cognition:  WNL  Sleep:  Number of Hours: 6     Have you used any form of tobacco in the last 30 days? (Cigarettes, Smokeless Tobacco, Cigars, and/or Pipes): Yes  Has this patient used any form of tobacco in the last 30 days? (Cigarettes, Smokeless Tobacco, Cigars, and/or Pipes) Yes, Yes, A prescription for an FDA-approved tobacco cessation medication was offered at discharge and the patient refused  Blood Alcohol level:  Lab Results  Component Value Date   ETH <10 03/11/2018    Metabolic Disorder Labs:  No results found for: HGBA1C, MPG No results found for: PROLACTIN No results found for: CHOL, TRIG, HDL, CHOLHDL, VLDL,  LDLCALC  See Psychiatric Specialty Exam and Suicide Risk Assessment completed by Attending Physician prior to discharge.  Discharge destination:  [name]  Is patient on multiple antipsychotic therapies at discharge:  No   Has Patient had three or more failed trials of antipsychotic monotherapy by history:  No  Recommended Plan for Multiple Antipsychotic Therapies: NA   Allergies as of 03/15/2018   No Known Allergies     Medication List    TAKE these medications     Indication  albuterol 108 (90 Base) MCG/ACT inhaler Commonly known as:  PROVENTIL HFA;VENTOLIN HFA Inhale 2 puffs into the lungs every 6 (six) hours. For shortness of breathing and wheezing  Indication:  Asthma   hydrOXYzine 25 MG tablet Commonly known as:  ATARAX/[name] Take 1 tablet (25 mg total) by mouth every 6 (six) hours as needed for anxiety.  Indication:  Feeling Anxious   QUEtiapine 25 MG tablet Commonly known as:  SEROQUEL Take 1 tablet (25 mg total) by mouth at bedtime.  Indication:  mood stability   sertraline 50 MG tablet Commonly known as:  ZOLOFT Take 1 tablet (50 mg total) by mouth daily. For mood control Start taking on:  03/16/2018  Indication:  mood stability      Follow-up Information    [facility] Follow up on 03/16/2018.   Why:  Screening for possible admission on Wed, 03/16/18 at 7:45AM. Please bring: photo ID/proof of [place] residency, 14 day supply of medications provided by the hospital, and clothing. Thank you.  Contact information: [address] [place] [address] [phone]        [name] Follow up.   Specialty:  Behavioral Health Why:  Walk in within 3 days of hospital/rehab discharge to be assessed for outpatient mental health services including medication management and therapy. Walk in hours: Mon-Fri 8am-10am. Thank you.  [address] [place] [address] [phone]           Follow-up  recommendations:  Continue activity as tolerated. Continue diet as recommended by your PCP. Ensure to keep all appointments with outpatient providers.  Comments:  Patient is instructed prior to discharge to: Take all medications as prescribed by his/her mental healthcare provider. Report any adverse effects and or reactions from the medicines to his/her outpatient provider promptly. Patient has been instructed & cautioned: To not engage in alcohol and or illegal drug use while on prescription medicines. In the event of worsening symptoms, patient is instructed to call the crisis hotline, 911 and or go to the nearest ED for appropriate evaluation and treatment of symptoms. To follow-up with his/her primary care provider for  your other medical issues, concerns and or health care needs.    Signed: Gerlene Burdock Inaya Gillham, FNP 03/15/2018, 11:40 AM

## 2018-03-15 NOTE — Progress Notes (Signed)
[facility] MD Progress Note  03/15/2018 10:05 AM Richard Moon  MRN:  [id number] Subjective: Richard Moon is seen and examined.  Patient is a 57 year old male with a past psychiatric history significant for cocaine dependence, alcohol dependence, probable COPD, and substance-induced mood disorder who is seen in follow-up.  He stated that the trazodone last night made him have sweats, and he wanted something else for sleep.  His chest x-ray was essentially negative with some suggestion of underlying emphysema.  We discussed that.  His lung exam is still clear.  His hepatitis panel was all negative.  His HIV is still pending.  [facility], and will be leaving tomorrow morning for the residential program.  We discussed having low-dose Seroquel for bedtime if necessary for sleep. Principal Problem: MDD (major depressive disorder), severe (HCC) Diagnosis:   Patient Active Problem List   Diagnosis Date Noted  . Cocaine dependence with cocaine-induced mood disorder (HCC) [F14.24]   . MDD (major depressive disorder), severe (HCC) [F32.2] 03/12/2018   Total Time spent with patient: 15 minutes  Past Psychiatric History: See admission H&P  Past Medical History: History reviewed. No pertinent past medical history.  Past Surgical History:  Procedure Laterality Date  . HERNIA REPAIR     Family History: History reviewed. No pertinent family history. Family Psychiatric  History: See admission H&P Social History:  Social History   Substance and Sexual Activity  Alcohol Use Yes   Comment: Occasionally      Social History   Substance and Sexual Activity  Drug Use Yes  . Types: Marijuana, Cocaine, Heroin    Social History   Socioeconomic History  . Marital status: Single    Spouse name: Not on file  . Number of children: Not on file  . Years of education: Not on file  . Highest education level: Not on file  Occupational History  . Not on file  Social Needs  . Financial resource  strain: Not on file  . Food insecurity:    Worry: Not on file    Inability: Not on file  . Transportation needs:    Medical: Not on file    Non-medical: Not on file  Tobacco Use  . Smoking status: Current Some Day Smoker    Packs/day: 0.25    Years: 20.00    Pack years: 5.00    Types: Cigarettes  . Smokeless tobacco: Never Used  Substance and Sexual Activity  . Alcohol use: Yes    Comment: Occasionally   . Drug use: Yes    Types: Marijuana, Cocaine, Heroin  . Sexual activity: Yes    Birth control/protection: Condom  Lifestyle  . Physical activity:    Days per week: Not on file    Minutes per session: Not on file  . Stress: Not on file  Relationships  . Social connections:    Talks on phone: Not on file    Gets together: Not on file    Attends religious service: Not on file    Active member of club or organization: Not on file    Attends meetings of clubs or organizations: Not on file    Relationship status: Not on file  Other Topics Concern  . Not on file  Social History Narrative  . Not on file   Additional Social History:                         Sleep: Fair  Appetite:  Fair  Current Medications: Current Facility-Administered Medications  Medication Dose Route Frequency Provider Last Rate Last Dose  . acetaminophen (TYLENOL) tablet 650 mg  650 mg Oral Q6H PRN Kerry HoughSimon, Spencer E, PA-C   650 mg at 03/13/18 [id number]  . albuterol (PROVENTIL HFA;VENTOLIN HFA) 108 (90 Base) MCG/ACT inhaler 2 puff  2 puff Inhalation Q6H Antonieta Pertlary, Nash Bolls Lawson, MD   2 puff at 03/15/18 0801  . alum & mag hydroxide-simeth (MAALOX/MYLANTA) 200-200-20 MG/5ML suspension 30 mL  30 mL Oral Q4H PRN Donell SievertSimon, Spencer E, PA-C      . feeding supplement (ENSURE ENLIVE) (ENSURE ENLIVE) liquid 237 mL  237 mL Oral BID BM Cobos, Rockey SituFernando A, MD   237 mL at 03/14/18 1300  . guaiFENesin (MUCINEX) 12 hr tablet 600 mg  600 mg Oral BID Antonieta Pertlary, Vannah Nadal Lawson, MD      . guaiFENesin-dextromethorphan [facility](ROBITUSSIN DM)  100-10 MG/5ML syrup 5 mL  5 mL Oral Q4H PRN Antonieta Pertlary, Glendale Wherry Lawson, MD      . hydrOXYzine (ATARAX/[name]) tablet 25 mg  25 mg Oral Q6H PRN Donell SievertSimon, Spencer E, PA-C      . magnesium hydroxide (MILK OF MAGNESIA) suspension 30 mL  30 mL Oral Daily PRN Kerry HoughSimon, Spencer E, PA-C      . multivitamin with minerals tablet 1 tablet  1 tablet Oral Daily Cobos, Rockey SituFernando A, MD   1 tablet at 03/15/18 0803  . QUEtiapine (SEROQUEL) tablet 25 mg  25 mg Oral QHS Antonieta Pertlary, Jericha Bryden Lawson, MD      . sertraline (ZOLOFT) tablet 50 mg  50 mg Oral Daily Antonieta Pertlary, Arisbel Maione Lawson, MD   50 mg at 03/15/18 0802  . thiamine (VITAMIN B-1) tablet 100 mg  100 mg Oral Daily Antonieta Pertlary, Kenzie Flakes Lawson, MD   100 mg at 03/15/18 [id number]    Lab Results:  Results for orders placed or performed during the hospital encounter of 03/12/18 (from the past 48 hour(s))  Hepatitis panel, acute     Status: None   Collection Time: 03/14/18  6:42 PM  Result Value Ref Range   Hepatitis B Surface Ag Negative Negative   HCV Ab <0.1 0.0 - 0.9 s/co ratio    Comment: (NOTE)                                  Negative:     < 0.8                             Indeterminate: 0.8 - 0.9                                  Positive:     > 0.9 The CDC recommends that a positive HCV antibody result be followed up with a HCV Nucleic Acid Amplification test ([id number]). Performed At: [facility] [address] [place], [place] [id number] Jolene SchimkeNagendra Sanjai MD AO:[id number]:[phone]    Hep A IgM Negative Negative   Hep B C IgM Negative Negative    Blood Alcohol level:  Lab Results  Component Value Date   ETH <10 03/11/2018    Metabolic Disorder Labs: No results found for: HGBA1C, MPG No results found for: PROLACTIN No results found for: CHOL, TRIG, HDL, CHOLHDL, VLDL, LDLCALC  Physical Findings: AIMS: Facial and Oral Movements Muscles of Facial Expression: None, normal Lips and  Perioral Area: None, normal Jaw: None, normal Tongue: None, normal,Extremity Movements Upper (arms,  wrists, hands, fingers): None, normal Lower (legs, knees, ankles, toes): None, normal, Trunk Movements Neck, shoulders, hips: None, normal, Overall Severity Severity of abnormal movements (highest score from questions above): None, normal Incapacitation due to abnormal movements: None, normal Patient's awareness of abnormal movements (rate only patient's report): No Awareness, Dental Status Current problems with teeth and/or dentures?: No Does patient usually wear dentures?: No  CIWA:  CIWA-Ar Total: 1 COWS:  COWS Total Score: 2  Musculoskeletal: Strength & Muscle Tone: within normal limits Gait & Station: normal Patient leans: N/A  Psychiatric Specialty Exam: Physical Exam  Nursing note and vitals reviewed. Constitutional: He is oriented to person, place, and time. He appears well-developed and well-nourished.  HENT:  Head: Normocephalic and atraumatic.  Respiratory: Effort normal and breath sounds normal.  Neurological: He is alert and oriented to person, place, and time.    ROS  Blood pressure 90/64, pulse 96, temperature 97.9 F (36.6 C), temperature source Oral, resp. rate 16, height 5\' 10"  (1.778 m), weight 63.5 kg.Body mass index is 20.09 kg/m.  General Appearance: Casual  Eye Contact:  Fair  Speech:  Normal Rate  Volume:  Normal  Mood:  Euthymic  Affect:  Congruent  Thought Process:  Coherent and Descriptions of Associations: Intact  Orientation:  Full (Time, Place, and Person)  Thought Content:  Logical  Suicidal Thoughts:  No  Homicidal Thoughts:  No  Memory:  Immediate;   Fair Recent;   Fair Remote;   Fair  Judgement:  Intact  Insight:  Fair  Psychomotor Activity:  Psychomotor Retardation  Concentration:  Concentration: Fair and Attention Span: Fair  Recall:  [employer] of Knowledge:  Fair  Language:  Fair  Akathisia:  Negative  Handed:  Right  AIMS (if indicated):     Assets:  Desire for Improvement Financial Resources/Insurance Resilience  ADL's:   Intact  Cognition:  WNL  Sleep:  Number of Hours: 6     Treatment Plan Summary: Daily contact with patient to assess and evaluate symptoms and progress in treatment, Medication management and Plan : Patient is seen and examined.  Patient is a 57 year old male with the above-stated past psychiatric history who is seen in follow-up.  #1 major depression versus substance-induced mood disorder-continue sertraline 50 mg p.o. daily for mood.  #2 productive cough-chest x-ray essentially negative except for some suggestion of emphysema.  Continue Ventolin inhalers for sputum and emphysema, and add Mucinex 600 mg p.o. twice daily for cough and mucus lysis.  #3 hepatitis panel all negative, HIV pending.  #4 cocaine/alcohol dependence-patient's been accepted into a residential substance abuse treatment program and will be discharged tomorrow morning.  #5 disposition-patient has been accepted into a residential substance abuse treatment program and will be discharged tomorrow.  Antonieta Pert, MD 03/15/2018, 10:05 AM

## 2018-03-16 LAB — HIV-1 RNA, QUALITATIVE, TMA: HIV-1 RNA, Qualitative, TMA: NEGATIVE

## 2018-03-16 NOTE — Progress Notes (Signed)
[facility] called for 7am pickup for [facility]. 

## 2018-03-16 NOTE — Progress Notes (Signed)
Pt discharged from unit after AVS, transition report, prescriptions, and suicide risk assessment were reviewed. Pt confirmed information with [facility]. Pt denies SI/HI/AVH and contracts to seek support if thoughts as such occur. Patient was offered support and encouragement. All belongings returned to patient. Pts questions were answered and was escorted safely to lobby awaiting transportation.

## 2020-04-24 ENCOUNTER — Emergency Department (HOSPITAL_COMMUNITY)
Admission: EM | Admit: 2020-04-24 | Discharge: 2020-04-25 | Disposition: A | Discharge status: Home / Self Care | Payer: Self-pay | Attending: Emergency Medicine | Admitting: Emergency Medicine

## 2020-04-24 ENCOUNTER — Other Ambulatory Visit: Payer: Self-pay

## 2020-04-24 ENCOUNTER — Encounter (HOSPITAL_COMMUNITY): Payer: Self-pay | Admitting: Emergency Medicine

## 2020-04-24 DIAGNOSIS — F17210 Nicotine dependence, cigarettes, uncomplicated: Secondary | ICD-10-CM | POA: Insufficient documentation

## 2020-04-24 DIAGNOSIS — Z20822 Contact with and (suspected) exposure to covid-19: Secondary | ICD-10-CM | POA: Insufficient documentation

## 2020-04-24 DIAGNOSIS — R45851 Suicidal ideations: Secondary | ICD-10-CM | POA: Insufficient documentation

## 2020-04-24 DIAGNOSIS — F1420 Cocaine dependence, uncomplicated: Secondary | ICD-10-CM | POA: Insufficient documentation

## 2020-04-24 DIAGNOSIS — Z79899 Other long term (current) drug therapy: Secondary | ICD-10-CM | POA: Insufficient documentation

## 2020-04-24 DIAGNOSIS — R440 Auditory hallucinations: Secondary | ICD-10-CM | POA: Insufficient documentation

## 2020-04-24 DIAGNOSIS — F1110 Opioid abuse, uncomplicated: Secondary | ICD-10-CM | POA: Insufficient documentation

## 2020-04-24 DIAGNOSIS — F332 Major depressive disorder, recurrent severe without psychotic features: Secondary | ICD-10-CM | POA: Insufficient documentation

## 2020-04-24 LAB — CBC
HCT: 45.7 % (ref 39.0–52.0)
Hemoglobin: 14.9 g/dL (ref 13.0–17.0)
MCH: 32.5 pg (ref 26.0–34.0)
MCHC: 32.6 g/dL (ref 30.0–36.0)
MCV: 99.6 fL (ref 80.0–100.0)
Platelets: 226 10*3/uL (ref 150–400)
RBC: 4.59 MIL/uL (ref 4.22–5.81)
RDW: 13.7 % (ref 11.5–15.5)
WBC: 5.3 10*3/uL (ref 4.0–10.5)
nRBC: 0 % (ref 0.0–0.2)

## 2020-04-24 NOTE — ED Triage Notes (Signed)
Patient reports suicidal ideation plans to jump from a building with auditory hallucinations .

## 2020-04-25 ENCOUNTER — Other Ambulatory Visit: Payer: Self-pay

## 2020-04-25 ENCOUNTER — Ambulatory Visit (HOSPITAL_COMMUNITY)
Admission: EM | Admit: 2020-04-25 | Discharge: 2020-04-25 | Disposition: A | Discharge status: Home / Self Care | Payer: No Payment, Other | Attending: Psychiatry | Admitting: Psychiatry

## 2020-04-25 ENCOUNTER — Encounter (HOSPITAL_COMMUNITY): Payer: Self-pay | Admitting: Emergency Medicine

## 2020-04-25 DIAGNOSIS — F1994 Other psychoactive substance use, unspecified with psychoactive substance-induced mood disorder: Secondary | ICD-10-CM | POA: Insufficient documentation

## 2020-04-25 LAB — COMPREHENSIVE METABOLIC PANEL
ALT: 15 U/L (ref 0–44)
AST: 26 U/L (ref 15–41)
Albumin: 4.2 g/dL (ref 3.5–5.0)
Alkaline Phosphatase: 44 U/L (ref 38–126)
Anion gap: 11 (ref 5–15)
BUN: 16 mg/dL (ref 6–20)
CO2: 24 mmol/L (ref 22–32)
Calcium: 9.5 mg/dL (ref 8.9–10.3)
Chloride: 105 mmol/L (ref 98–111)
Creatinine, Ser: 1.45 mg/dL — ABNORMAL HIGH (ref 0.61–1.24)
GFR, Estimated: 56 mL/min — ABNORMAL LOW (ref >60)
Glucose, Bld: 88 mg/dL (ref 70–99)
Potassium: 3.4 mmol/L — ABNORMAL LOW (ref 3.5–5.1)
Sodium: 140 mmol/L (ref 135–145)
Total Bilirubin: 0.9 mg/dL (ref 0.3–1.2)
Total Protein: 6.7 g/dL (ref 6.5–8.1)

## 2020-04-25 LAB — RAPID URINE DRUG SCREEN, HOSP PERFORMED
Amphetamines: NOT DETECTED
Barbiturates: NOT DETECTED
Benzodiazepines: NOT DETECTED
Cocaine: POSITIVE — AB
Opiates: NOT DETECTED
Tetrahydrocannabinol: NOT DETECTED

## 2020-04-25 LAB — ETHANOL: Alcohol, Ethyl (B): <10 mg/dL (ref <10)

## 2020-04-25 LAB — ACETAMINOPHEN LEVEL: Acetaminophen (Tylenol), Serum: <10 ug/mL — ABNORMAL LOW (ref 10–30)

## 2020-04-25 LAB — RESPIRATORY PANEL BY RT PCR (FLU A&B, COVID)
Influenza A by PCR: NEGATIVE
Influenza B by PCR: NEGATIVE
SARS Coronavirus 2 by RT PCR: NEGATIVE

## 2020-04-25 LAB — SALICYLATE LEVEL: Salicylate Lvl: <7 mg/dL — ABNORMAL LOW (ref 7.0–30.0)

## 2020-04-25 MED ORDER — ONDANSETRON HCL 4 MG PO TABS: 4.0000 mg | ORAL_TABLET | Freq: Three times a day (TID) | ORAL | Status: DC | PRN

## 2020-04-25 MED ORDER — THIAMINE HCL 100 MG PO TABS: 100.0000 mg | ORAL_TABLET | Freq: Every day | ORAL | Status: DC

## 2020-04-25 MED ORDER — NICOTINE 21 MG/24HR TD PT24: 21.0000 mg | MEDICATED_PATCH | Freq: Every day | TRANSDERMAL | Status: DC

## 2020-04-25 MED ORDER — THIAMINE HCL 100 MG/ML IJ SOLN: 100.0000 mg | Freq: Every day | INTRAMUSCULAR | Status: DC

## 2020-04-25 MED ORDER — ZOLPIDEM TARTRATE 5 MG PO TABS: 5.0000 mg | ORAL_TABLET | Freq: Every evening | ORAL | Status: DC | PRN

## 2020-04-25 MED ORDER — LORAZEPAM 1 MG PO TABS: 0.0000 mg | ORAL_TABLET | Freq: Two times a day (BID) | ORAL | Status: DC

## 2020-04-25 MED ORDER — HYDROXYZINE HCL 25 MG PO TABS: 25.0000 mg | ORAL_TABLET | Freq: Four times a day (QID) | ORAL | Status: DC | PRN

## 2020-04-25 MED ORDER — MAGNESIUM HYDROXIDE 400 MG/5ML PO SUSP: 30.0000 mL | Freq: Every day | ORAL | Status: DC | PRN

## 2020-04-25 MED ORDER — POTASSIUM CHLORIDE CRYS ER 20 MEQ PO TBCR
40.0000 meq | EXTENDED_RELEASE_TABLET | Freq: Once | ORAL | Status: AC
  Administered 2020-04-25: 40 meq via ORAL
  Filled 2020-04-25: qty 2

## 2020-04-25 MED ORDER — SERTRALINE HCL 25 MG PO TABS
25.0000 mg | ORAL_TABLET | Freq: Every day | ORAL | Status: DC
  Administered 2020-04-25: 25 mg via ORAL
  Filled 2020-04-25: qty 1

## 2020-04-25 MED ORDER — QUETIAPINE FUMARATE 25 MG PO TABS: 25.0000 mg | ORAL_TABLET | Freq: Every day | ORAL | Status: DC

## 2020-04-25 MED ORDER — GABAPENTIN 300 MG PO CAPS
300.0000 mg | ORAL_CAPSULE | Freq: Three times a day (TID) | ORAL | Status: DC
  Administered 2020-04-25: 300 mg via ORAL
  Filled 2020-04-25: qty 1

## 2020-04-25 MED ORDER — LORAZEPAM 1 MG PO TABS: 0.0000 mg | ORAL_TABLET | Freq: Four times a day (QID) | ORAL | Status: DC

## 2020-04-25 MED ORDER — ACETAMINOPHEN 325 MG PO TABS: 650.0000 mg | ORAL_TABLET | ORAL | Status: DC | PRN

## 2020-04-25 MED ORDER — ACETAMINOPHEN 325 MG PO TABS: 650.0000 mg | ORAL_TABLET | Freq: Four times a day (QID) | ORAL | Status: DC | PRN

## 2020-04-25 MED ORDER — LORAZEPAM 2 MG/ML IJ SOLN: 0.0000 mg | Freq: Two times a day (BID) | INTRAMUSCULAR | Status: DC

## 2020-04-25 MED ORDER — LORAZEPAM 2 MG/ML IJ SOLN: 0.0000 mg | Freq: Four times a day (QID) | INTRAMUSCULAR | Status: DC

## 2020-04-25 MED ORDER — ALUM & MAG HYDROXIDE-SIMETH 200-200-20 MG/5ML PO SUSP: 30.0000 mL | ORAL | Status: DC | PRN

## 2020-04-25 MED ORDER — ALUM & MAG HYDROXIDE-SIMETH 200-200-20 MG/5ML PO SUSP: 30.0000 mL | Freq: Four times a day (QID) | ORAL | Status: DC | PRN

## 2020-04-25 NOTE — ED Notes (Signed)
Pt resting with eyes closed. No acute distress noted. Safety maintained. 

## 2020-04-25 NOTE — ED Notes (Signed)
Patient A&O x 4, ambulatory. Patient discharged in no acute distress. Patient denied SI/HI, A/VH upon discharge. Patient verbalized understanding of all discharge instructions explained by staff, to include follow up appointments and safety plan. Pt belongings returned to patient from locker intact. Patient given bus pass and escorted to lobby via staff for transport to destination. Safety maintained.

## 2020-04-25 NOTE — ED Provider Notes (Addendum)
Behavioral Health Admission H&P [facility] & OBS)  Date: 04/25/20 Patient Name: Richard Moon MRN: [id number] Chief Complaint:  Chief Complaint  Patient presents with  . Suicidal      Diagnoses:  Final diagnoses:  Substance induced mood disorder (HCC)    HPI: Richard Moon a 59 y.o.with a history of depression, alcohol abuse, anc cocaine abuse who presented to [facility] due to worsening depression and suicidal thoughts. He was transferred to [facility] for continuous assessment.    Patient states that he has been feeling suicidal with plan to jump off of building at a construction site. States the last time he felt this way was about a year ago. Reports that he drinks 2 to 3 (16 ounce) beers per day. States that he has been using cocaine most days of the week. Last use yesterday. States that he has been using cocaine off and on for the past 20 years. States that he has bene hearing voices for the past 2 days that make negative comments about him. Denies that the voices are command in nature. Denies visual hallucinations. He does not appear to be responding to internal stimuli.    He is interested in residential substance abuse treatment/detox.    PHQ 2-9:     ED from 04/24/2020 in [facility] EMERGENCY DEPARTMENT Admission (Discharged) [facility] from 03/11/2018 in [facility] EM[facility]GENCY DEPARTMENT  C-SSRS RISK CATEGORY High Risk High Risk High Risk       Total Time spent with patient: 20 minutes  Musculoskeletal  Strength & Muscle Tone: within normal limits Gait & Station: normal Patient leans: N/A  Psychiatric Specialty Exam  Presentation General Appearance: Appropriate for Environment;Neat  Eye Contact:Good  Speech:Clear and Coherent;Normal Rate  Speech Volume:Normal  Handedness:Right   Mood and Affect  Mood:Anxious;Depressed  Affect:Congruent   Thought Process  Thought  Processes:Coherent  Descriptions of Associations:Intact  Orientation:Full (Time, Place and Person)  Thought Content:WDL  Hallucinations:Hallucinations: Auditory Description of Auditory Hallucinations: hear voices that make negative comments about him  Ideas of Reference:None  Suicidal Thoughts:Suicidal Thoughts: Yes, Active SI Active Intent and/or Plan: With Intent;With Plan  Homicidal Thoughts:Homicidal Thoughts: No   Sensorium  Memory:Immediate Good;Recent Good;Remote Good  Judgment:Intact  Insight:Fair   Executive Functions  Concentration:Good  Attention Span:Good  Recall:Good  Fund of Knowledge:Good  Language:Good   Psychomotor Activity  Psychomotor Activity:Psychomotor Activity: Normal   Assets  Assets:Communication Skills;Desire for Improvement   Sleep  Sleep:Sleep: Fair   Physical Exam Constitutional:      General: He is not in acute distress.    Appearance: He is not ill-appearing, toxic-appearing or diaphoretic.  HENT:     Head: Normocephalic.     Right Ear: External ear normal.     Left Ear: External ear normal.  Eyes:     Pupils: Pupils are equal, round, and reactive to light.  Cardiovascular:     Rate and Rhythm: Normal rate.  Pulmonary:     Effort: Pulmonary effort is normal. No respiratory distress.  Musculoskeletal:        General: Normal range of motion.  Skin:    General: Skin is warm and dry.  Neurological:     Mental Status: He is alert and oriented to person, place, and time.  Psychiatric:        Mood and Affect: Mood is anxious and depressed.        Speech: Speech normal.  Behavior: Behavior is cooperative.        Thought Content: Thought content is not paranoid or delusional. Thought content includes suicidal ideation. Thought content does not include homicidal ideation. Thought content includes suicidal plan.    Review of Systems  Constitutional: Negative for chills, diaphoresis, fever, malaise/fatigue and  weight loss.  HENT: Negative for congestion.   Respiratory: Negative for cough and shortness of breath.   Cardiovascular: Negative for chest pain and palpitations.  Gastrointestinal: Negative for diarrhea, nausea and vomiting.  Neurological: Negative for dizziness and seizures.  Psychiatric/Behavioral: Positive for depression, hallucinations, substance abuse and suicidal ideas. Negative for memory loss. The patient is nervous/anxious and has insomnia.   All other systems reviewed and are negative.   Blood pressure 99/70, pulse 70, temperature 98.6 F (37 C), resp. rate 20, SpO2 98 %. There is no height or weight on file to calculate BMI.  Past Psychiatric History: MDD, Cocaine Use, Alcohol Use, Inpatient at [facility] 02/2018.   Is the patient at risk to self? Yes  Has the patient been a risk to self in the past 6 months? No .    Has the patient been a risk to self within the distant past? Yes   Is the patient a risk to others? No   Has the patient been a risk to others in the past 6 months? No   Has the patient been a risk to others within the distant past? No   Past Medical History: History reviewed. No pertinent past medical history.  Past Surgical History:  Procedure Laterality Date  . HERNIA REPAIR      Family History: History reviewed. No pertinent family history.  Social History:  Social History   Socioeconomic History  . Marital status: Single    Spouse name: Not on file  . Number of children: Not on file  . Years of education: Not on file  . Highest education level: Not on file  Occupational History  . Not on file  Tobacco Use  . Smoking status: Current Some Day Smoker    Packs/day: 0.25    Years: 20.00    Pack years: 5.00    Types: Cigarettes  . Smokeless tobacco: Never Used  Vaping Use  . Vaping Use: Every day  Substance and Sexual Activity  . Alcohol use: Yes    Comment: Occasionally   . Drug use: Yes    Types: Marijuana, Cocaine, Heroin  . Sexual activity:  Yes    Birth control/protection: Condom  Other Topics Concern  . Not on file  Social History Narrative  . Not on file   Social Determinants of Health   Financial Resource Strain:   . Difficulty of Paying Living Expenses: Not on file  Food Insecurity:   . Worried About Programme researcher, broadcasting/film/video in the Last Year: Not on file  . Ran Out of Food in the Last Year: Not on file  Transportation Needs:   . Lack of Transportation (Medical): Not on file  . Lack of Transportation (Non-Medical): Not on file  Physical Activity:   . Days of Exercise per Week: Not on file  . Minutes of Exercise per Session: Not on file  Stress:   . Feeling of Stress : Not on file  Social Connections:   . Frequency of Communication with Friends and Family: Not on file  . Frequency of Social Gatherings with Friends and Family: Not on file  . Attends Religious Services: Not on file  . Active Member  of Clubs or Organizations: Not on file  . Attends Banker Meetings: Not on file  . Marital Status: Not on file  Intimate Partner Violence:   . Fear of Current or Ex-Partner: Not on file  . Emotionally Abused: Not on file  . Physically Abused: Not on file  . Sexually Abused: Not on file    SDOH:  SDOH Screenings   Alcohol Screen:   . Last Alcohol Screening Score (AUDIT): Not on file  Depression (PHQ2-9):   . PHQ-2 Score: Not on file  Financial Resource Strain:   . Difficulty of Paying Living Expenses: Not on file  Food Insecurity:   . Worried About Programme researcher, broadcasting/film/video in the Last Year: Not on file  . Ran Out of Food in the Last Year: Not on file  Housing:   . Last Housing Risk Score: Not on file  Physical Activity:   . Days of Exercise per Week: Not on file  . Minutes of Exercise per Session: Not on file  Social Connections:   . Frequency of Communication with Friends and Family: Not on file  . Frequency of Social Gatherings with Friends and Family: Not on file  . Attends Religious Services: Not  on file  . Active Member of Clubs or Organizations: Not on file  . Attends Banker Meetings: Not on file  . Marital Status: Not on file  Stress:   . Feeling of Stress : Not on file  Tobacco Use: High Risk  . Smoking Tobacco Use: Current Some Day Smoker  . Smokeless Tobacco Use: Never Used  Transportation Needs:   . Freight forwarder (Medical): Not on file  . Lack of Transportation (Non-Medical): Not on file    Last Labs:  Admission on 04/24/2020, Discharged on 04/25/2020  Component Date Value Ref Range Status  . Sodium 04/24/2020 140  135 - 145 mmol/L Final  . Potassium 04/24/2020 3.4* 3.5 - 5.1 mmol/L Final  . Chloride 04/24/2020 105  98 - 111 mmol/L Final  . CO2 04/24/2020 24  22 - 32 mmol/L Final  . Glucose, Bld 04/24/2020 88  70 - 99 mg/dL Final   Glucose reference range applies only to samples taken after fasting for at least 8 hours.  . BUN 04/24/2020 16  6 - 20 mg/dL Final  . Creatinine, Ser 04/24/2020 1.45* 0.61 - 1.24 mg/dL Final  . Calcium 63/78/5885 9.5  8.9 - 10.3 mg/dL Final  . Total Protein 04/24/2020 6.7  6.5 - 8.1 g/dL Final  . Albumin 02/77/4128 4.2  3.5 - 5.0 g/dL Final  . AST 78/67/6720 26  15 - 41 U/L Final  . ALT 04/24/2020 15  0 - 44 U/L Final  . Alkaline Phosphatase 04/24/2020 44  38 - 126 U/L Final  . Total Bilirubin 04/24/2020 0.9  0.3 - 1.2 mg/dL Final  . GFR, Estimated 04/24/2020 56* >60 mL/min Final   Comment: (NOTE) Calculated using the CKD-EPI Creatinine Equation (2021)   . Anion gap 04/24/2020 11  5 - 15 Final   Performed at [facility], [address], [place], [place] [address]  . Alcohol, Ethyl (B) 04/24/2020 <10  <10 mg/dL Final   Comment: (NOTE) Lowest detectable limit for serum alcohol is 10 mg/dL.  For medical purposes only. Performed at [facility], [address], [place], [place] [address]   . Salicylate Lvl 04/24/2020 <7.0* 7.0 - 30.0 mg/dL Final   Performed at [facility], [address] [place], [place] [address]  . Acetaminophen (Tylenol), Serum 04/24/2020 <10* 10 - 30 ug/mL Final   Comment: (NOTE) Therapeutic concentrations vary significantly. A range of 10-30 ug/mL  may be an effective concentration for many patients. However, some  are best treated at concentrations outside of this range. Acetaminophen concentrations >150 ug/mL at 4 hours after ingestion  and >50 ug/mL at 12 hours after ingestion are often associated with  toxic reactions.  Performed at [facility], [address] [place], [place] [address]   . WBC 04/24/2020 5.3  4.0 - 10.5 K/uL Final  . RBC 04/24/2020 4.59  4.22 - 5.81 MIL/uL Final  . Hemoglobin 04/24/2020 14.9  13.0 - 17.0 g/dL Final  . HCT 86/57/8469 45.7  39 - 52 % Final  . MCV 04/24/2020 99.6  80.0 - 100.0 fL Final  . MCH 04/24/2020 32.5  26.0 - 34.0 pg Final  . MCHC 04/24/2020 32.6  30.0 - 36.0 g/dL Final  . RDW 62/95/2841 13.7  11.5 - 15.5 % Final  . Platelets 04/24/2020 226  150 - 400 K/uL Final  . nRBC 04/24/2020 0.0  0.0 - 0.2 % Final   Performed at [facility], [address], [place], [place] [address]  . Opiates 04/24/2020 NONE DETECTED  NONE DETECTED Final  . Cocaine 04/24/2020 POSITIVE* NONE DETECTED Final  . Benzodiazepines 04/24/2020 NONE DETECTED  NONE DETECTED Final  . Amphetamines 04/24/2020 NONE DETECTED  NONE DETECTED Final  . Tetrahydrocannabinol 04/24/2020 NONE DETECTED  NONE DETECTED Final  . Barbiturates 04/24/2020 NONE DETECTED  NONE DETECTED Final   Comment: (NOTE) DRUG SCREEN FOR MEDICAL PURPOSES ONLY.  IF CONFIRMATION IS NEEDED FOR ANY PURPOSE, NOTIFY LAB WITHIN 5 DAYS.  LOWEST DETECTABLE LIMITS FOR URINE DRUG SCREEN Drug Class                     Cutoff (ng/mL) Amphetamine and metabolites    1000 Barbiturate and metabolites    200 Benzodiazepine                 200 Tricyclics and metabolites     300 Opiates and metabolites        300 Cocaine and metabolites        300 THC                             50 Performed at [facility], [address] [place], [place] [address]   . SARS Coronavirus 2 by RT PCR 04/25/2020 NEGATIVE  NEGATIVE Final   Comment: (NOTE) SARS-CoV-2 target nucleic acids are NOT DETECTED.  The SARS-CoV-2 RNA is generally detectable in upper respiratoy specimens during the acute phase of infection. The lowest concentration of SARS-CoV-2 viral copies this assay can detect is 131 copies/mL. A negative result does not preclude SARS-Cov-2 infection and should not be used as the sole basis for treatment or other patient management decisions. A negative result may occur with  improper specimen collection/handling, submission of specimen other than nasopharyngeal swab, presence of viral mutation(s) within the areas targeted by this assay, and inadequate number of viral copies (<131 copies/mL). A negative result must be combined with clinical observations, patient history, and epidemiological information. The expected result is Negative.  Fact Sheet for Patients:  https://www.moore.com/  Fact Sheet for Healthcare Providers:  https://www.young.biz/  This test is no  t yet approved or cleared by the [place] FDA and  has been authorized for detection and/or diagnosis of SARS-CoV-2 by FDA under an Emergency Use Authorization (EUA). This EUA will remain  in effect (meaning this test can be used) for the duration of the COVID-19 declaration under Section 564(b)(1) of the Act, 21 U.S.C. section 360bbb-3(b)(1), unless the authorization is terminated or revoked sooner.    . Influenza A by PCR 04/25/2020 NEGATIVE  NEGATIVE Final  . Influenza B by PCR 04/25/2020 NEGATIVE  NEGATIVE Final   Comment: (NOTE) The Xpert Xpress SARS-CoV-2/FLU/RSV assay is intended as an aid in  the diagnosis of influenza from Nasopharyngeal swab specimens and  should not be used as a sole basis for treatment.  Nasal washings and  aspirates are unacceptable for Xpert Xpress SARS-CoV-2/FLU/RSV  testing.  Fact Sheet for Patients: https://www.moore.com/https://www.fda.gov/media/142436/download  Fact Sheet for Healthcare Providers: https://www.young.biz/https://www.fda.gov/media/142435/download  This test is not yet approved or cleared by the [place] FDA and  has been authorized for detection and/or diagnosis of SARS-CoV-2 by  FDA under an Emergency Use Authorization (EUA). This EUA will remain  in effect (meaning this test can be used) for the duration of the  Covid-19 declaration under Section 564(b)(1) of the Act, 21  U.S.C. section 360bbb-3(b)(1), unless the authorization is  terminated or revoked. Performed at [facility], [address] [place], [place] [id number]     Allergies: Patient has no known allergies.  PTA Medications: (Not in a hospital admission)   Medical Decision Making  Patient was medically cleared in the emergency department  Monitor CIWA  Resume sertraline 25 mg daily for depression Resume seroquel 25 mg QHS for sleep/AH Start gabapentin 300 mg TID for alcohol withdrawal    Recommendations  Based on my evaluation the patient does not appear to have an emergency medical condition.  Jackelyn PolingJason A Aldyn Toon, NP 04/25/20  6:37 AM

## 2020-04-25 NOTE — ED Notes (Signed)
Pt belongings in locker #[id number] 

## 2020-04-25 NOTE — BH Assessment (Signed)
Comprehensive Clinical Assessment (CCA) Note  04/25/2020 Richard Moon [id number]    Per EDP report, "  Richard Moon is a 59 y.o. male with a hx of depression, tobacco abuse, & cocaine use who presents to the ED with complaints of suicidal ideation x 2 days. Patient states that he has been feeling suicidal with plan to  jump off of building at a construction site. No attempts made.  No alleviating/aggravating factors. Reports he has been hearing voices telling him that he is no good.  States the last time he felt this way was about a year ago. Admits to EtOH & cocaine use. Drinks 6 pack of 16 ounce beers per day- did have ETOH & cocaine earlier today. Denies HI, visual hallucinations, chest pain, dyspnea, fever, or abdominal pain.      During assessment pt presented calm and cooperative, depressed mood and affect. Pt states that he has been experiencing audio hallucinations for the past 2 days, voices telling him he is no good. Pt endorses current SI thoughts, states he had plan to jump off building where he works at. Pt denies HI, SIB, reports previous SI attempts. Per pt chart pt was last assessed in 2019 for similar presentation and has psych inpatient treatment history. Pt admits to drug abuse of cocaine, marijuana and alcohol almost daily, states he drank a few beers today and has been drinking and abusing cocaine and marijuana for the past 2 weeks daily. Pt reports symptoms of depression: worthlessness,hopelessness, isolation, anxiety. Pt reports only getting 4 hours of sleep daily with  A poor appetite. Pt denies any history of trauma/abuse, family history of alcoholism, no SI attempts. Pt reports he currently does not have a provider and not taking any psych meds at this time. Pt states he has no family support and currently employed living with room mates. Pt states he is open to medications, therapy and additional treatment.      Diagnosis:F33.2 Major depressive disorder, Recurrent  episode, Severe F14.2 Cocaine use disorder, Severe F12.20 Cannabis use disorder, moderate F11.10 Opioid use disorder, Mild  Disposition: Nira Conn, FNP recommends pt for overnight observation at [facility], reassess in the morning.  Chief Complaint:  Chief Complaint  Patient presents with  . Suicidal   Visit Diagnosis:    CCA Screening, Triage and Referral (STR)  Patient Reported Information How did you hear about [place]? Self  Referral name: SELF Referral phone number: No data recorded  Whom do you see for routine medical problems? I don't have a doctor  Practice/Facility Name: No data recorded Practice/Facility Phone Number: No data recorded Name of Contact: No data recorded Contact Number: No data recorded Contact Fax Number: No data recorded Prescriber Name: No data recorded Prescriber Address (if known): No data recorded  What Is the Reason for Your Visit/Call Today? Suicidal thoughts, depression, drug abuse How Long Has This Been Causing You Problems? 1 wk - 1 month  What Do You Feel Would Help You the Most Today? Therapy;Medication   Have You Recently Been in Any Inpatient Treatment (Hospital/Detox/Crisis Center/28-Day Program)? No  Name/Location of Program/Hospital:No data recorded How Long Were You There? No data recorded When Were You Discharged? No data recorded  Have You Ever Received Services From [facility] Before? Yes  [facility]   Have You Recently Had Any Thoughts About Hurting Yourself? Yes  Are You Planning to Commit Suicide/Harm Yourself At This time? Yes   Have you  Recently Had Thoughts About Hurting Someone Karolee Ohslse? No  Explanation: No data recorded  Have You Used Any Alcohol or Drugs in the Past 24 Hours? Yes  How Long Ago Did You Use Drugs or Alcohol? No data recorded What Did You Use and How Much? cocaine, marijuana and alchol earlier today (3 beers)   Do You Currently Have a  Therapist/Psychiatrist? No  Name of Therapist/Psychiatrist: No data recorded  Have You Been Recently Discharged From Any Office Practice or Programs? No  Explanation of Discharge From Practice/Program: No data recorded    CCA Screening Triage Referral Assessment Type of Contact: Tele-Assessment  Is this Initial or Reassessment? Initial Assessment  Date Telepsych consult ordered in [facility]:  04/25/20  Time Telepsych consult ordered in [facility]:  No data recorded  Patient Reported Information Reviewed? Yes  Patient Left Without Being Seen? No data recorded Reason for Not Completing Assessment: No data recorded  Collateral Involvement: No data recorded  Does Patient Have a Court Appointed Legal Guardian? No data recorded Name and Contact of Legal Guardian: No data recorded If Minor and Not Living with Parent(s), Who has Custody? No data recorded Is CPS involved or ever been involved? Never  Is APS involved or ever been involved? Never   Patient Determined To Be At Risk for Harm To Self or Others Based on Review of Patient Reported Information or Presenting Complaint? No  Method: No data recorded Availability of Means: No data recorded Intent: No data recorded Notification Required: No data recorded Additional Information for Danger to Others Potential: No data recorded Additional Comments for Danger to Others Potential: No data recorded Are There Guns or Other Weapons in Your Home? No data recorded Types of Guns/Weapons: No data recorded Are These Weapons Safely Secured?                            No data recorded Who Could Verify You Are Able To Have These Secured: No data recorded Do You Have any Outstanding Charges, Pending Court Dates, Parole/Probation? No data recorded Contacted To Inform of Risk of Harm To Self or Others: No data recorded  [facility]   Does Patient Present under Involuntary Commitment? No  IVC Papers Initial File Date: No data  recorded  [place]   Patient Currently Receiving the Following Services: Not Receiving Services   Determination of Need: Urgent (48 hours)   Options For Referral: Other: Comment     CCA Biopsychosocial  Intake/Chief Complaint:  suicidal   Patient Reported Schizophrenia/Schizoaffective Diagnosis in Past: No   Mental Health Symptoms Depression:  Hopelessness;Irritability;Sleep (too much or little);Worthlessness   Duration of Depressive symptoms: Greater than two weeks   Mania:  None   Anxiety:   Worrying;Restlessness   Psychosis:  Hallucinations   Duration of Psychotic symptoms: Less than six months   Trauma:  None   Obsessions:  None   Compulsions:  None   Inattention:  None   Hyperactivity/Impulsivity:  N/A   Oppositional/Defiant Behaviors:  None   Emotional Irregularity:  None   Other Mood/Personality Symptoms:  No data recorded   Mental Status Exam Appearance and self-care  Stature:  Tall   Weight:  Thin   Clothing:  Casual   Grooming:  Normal   Cosmetic use:  Age appropriate   Posture/gait:  Normal   Motor activity:  Restless   Sensorium  Attention:  Normal   Concentration:  Normal  Orientation:  Situation;Place;Person;Object   Recall/memory:  Normal   Affect and Mood  Affect:  Depressed   Mood:  Depressed   Relating  Eye contact:  Normal   Facial expression:  Depressed   Attitude toward examiner:  Cooperative   Thought and Language  Speech flow: Clear and Coherent   Thought content:  Appropriate to Mood and Circumstances   Preoccupation:  Suicide   Hallucinations:  Auditory   Organization:  No data recorded  [employer] of Knowledge:  Average   Intelligence:  Average   Abstraction:  Normal   Judgement:  Good   Reality Testing:  Adequate   Insight:  Good   Decision Making:  Normal   Social Functioning  Social Maturity:  Isolates   Social Judgement:  Normal    Stress  Stressors:  Other (Comment)   Coping Ability:  Exhausted   Skill Deficits:  None   Supports:  Support needed      Religion: Religion/Spirituality Are You A Religious Person?: No  Leisure/Recreation: Leisure / Recreation Do You Have Hobbies?: No  Exercise/Diet: Exercise/Diet Do You Exercise?: No Have You Gained or Lost A Significant Amount of Weight in the Past Six Months?: No Do You Follow a Special Diet?: No Do You Have Any Trouble Sleeping?: Yes Explanation of Sleeping Difficulties:  (anxiety and stress)   CCA Employment/Education  Employment/Work Situation: Employment / Work Situation Employment situation: Employed Has patient ever been in the [employer]?: Yes (Describe in comment)  Education: Education Is Patient Currently Attending School?: No Did You Product manager?: No Did Designer, television/film set?: No Did You Have An Individualized Education Program (IIEP): No Did You Have Any Difficulty At [employer]?: No Patient's Education Has Been Impacted by Current Illness: No   CCA Family/Childhood History  Family and Relationship History: Family history Marital status: Single Does patient have children?: Yes How many children?: 2  Childhood History:  Childhood History By whom was/is the patient raised?: Both parents Does patient have siblings?: Yes Number of Siblings: 2 Did patient suffer any verbal/emotional/physical/sexual abuse as a child?: No Did patient suffer from severe childhood neglect?: No Has patient ever been sexually abused/assaulted/raped as an adolescent or adult?: No Was the patient ever a victim of a crime or a disaster?: No Witnessed domestic violence?: No Has patient been affected by domestic violence as an adult?: No  Child/Adolescent Assessment:     CCA Substance Use  Alcohol/Drug Use: Alcohol / Drug Use Pain Medications: see MAR History of alcohol / drug use?: Yes Substance #1 Name of Substance 1:  (Cocaine) 1 -  Age of First Use:  (59 years old) 1 - Amount (size/oz):  (unknown) 1 - Frequency:  (daily) 1 - Last Use / Amount:  (earlier today) Substance #2 Name of Substance 2:  (marijuana) 2 - Age of First Use:  (27) 2 - Amount (size/oz):  (unknown) 2 - Frequency:  (occasional) 2 - Last Use / Amount:  (earlier)           ASAM's:  Six Dimensions of Multidimensional Assessment  Dimension 1:  Acute Intoxication and/or Withdrawal Potential:   Dimension 1:  Description of individual's past and current experiences of substance use and withdrawal: 2  Dimension 2:  Biomedical Conditions and Complications:   Dimension 2:  Description of patient's biomedical conditions and  complications: 2  Dimension 3:  Emotional, Behavioral, or Cognitive Conditions and Complications:  Dimension 3:  Description of emotional, behavioral, or cognitive conditions and complications:  3  Dimension 4:  Readiness to Change:  Dimension 4:  Description of Readiness to Change criteria: 2  Dimension 5:  Relapse, Continued use, or Continued Problem Potential:  Dimension 5:  Relapse, continued use, or continued problem potential critiera description: 3  Dimension 6:  Recovery/Living Environment:  Dimension 6:  Recovery/Iiving environment criteria description: 2  ASAM Severity Score: ASAM's Severity Rating Score: 14  ASAM Recommended Level of Treatment:     Substance use Disorder (SUD) Substance Use Disorder (SUD)  Checklist Symptoms of Substance Use: Substance(s) often taken in larger amounts or over longer times than was intended, Continued use despite having a persistent/recurrent physical/psychological problem caused/exacerbated by use  Recommendations for Services/Supports/Treatments: Recommendations for Services/Supports/Treatments Recommendations For Services/Supports/Treatments: Inpatient Hospitalization, Medication Management, Other (Comment)  DSM5 Diagnoses: Patient Active Problem List   Diagnosis Date Noted  .  Cocaine dependence with cocaine-induced mood disorder (HCC)   . MDD (major depressive disorder), severe (HCC) 03/12/2018    Patient Centered Plan: Patient is on the following Treatment Plan(s):     Referrals to Alternative Service(s): Referred to Alternative Service(s):   Place:   Date:   Time:    Referred to Alternative Service(s):   Place:   Date:   Time:    Referred to Alternative Service(s):   Place:   Date:   Time:    Referred to Alternative Service(s):   Place:   Date:   Time:       Natasha Mead, LCSWA

## 2020-04-25 NOTE — ED Triage Notes (Signed)
Presents with suicidal ideations, plan to jump off bridge or building.

## 2020-04-25 NOTE — ED Notes (Signed)
WLED transfer, pt presents with suicidal ideations, plan to jump off building or bridge.  Hearing negative voices also.  Last  used cocaine yesterday.  A&O x 4, calm & cooperative.  Pt requesting detox.  Skin search completed,  MOnitoring for safety.

## 2020-04-25 NOTE — ED Provider Notes (Signed)
FBC/OBS ASAP Discharge Summary  Date and Time: 04/25/2020 3:21 PM  Name: Richard Moon  MRN:  [id number]   Discharge Diagnoses:  Final diagnoses:  Substance induced mood disorder (HCC)    Subjective: Patient interviewed this AM bedside in the observation area.  He states that he presented to the hospital for "using drugs" and that he has been using etoh, marijuana and cocaine for the last 2 weeks. Patient does not volunteer SI as being the reason he came to the hospital; however,  when asked directly he states he was feeling suicidal yesterday and says that he still feels "a llittle" suicidal today. Patient expressed interest in rehab. When he was informed that in order to go to rehab he couldn't be having SI, he indicates that if he were to get into rehab then he wouldn't be suicidal. Denies HI/AVH. Informed patient that I would speak with SW regarding rehab placement. Also informed patient that sometimes people are not able to go directly to rehab d/t the bed situation. Patient verbalized understanding. On speaking with SW, there were no beds available and peer support was consulted. Peer support spoke with patient and resources were provided and he was advised to call [facility] tomorrow morning and inquire about avilability.   On speaking with patient this afternoon, after he had spoken to peer support, he appeared visibly upset about not being able to go directly to rehab. Discussed with patient that he will be discharged and recommended him to call [facility] tomorrow. Patient became upset and asked  "are you kicking me out now?" and then said he was "still suicidal". When I recounted our previous conversation about not being suicidal if he could get into rehab, he became profane, stating "fuck you" and asked for a bus pass home. Bus pass provided.      Stay Summary:  Richard Moon a 59 y.o.with a history of depression, alcohol abuse, and cocaine abuse who presented to [facility] on 04/25/20 due to  worsening depression and suicidal thoughts. He was transferred to [facility] for continuous assessment.  UDS+ cocaine, Etoh negative. On initial assessment, Patient states that he has been feeling suicidal with plan to jump off of building at a construction site. States the last time he felt this way was about a year ago. Reports that he drinks 2 to 3 (16 ounce) beers per day. States that he has been using cocaine most days of the week. Last use yesterday. States that he has been using cocaine off and on for the past 20 years. States that he has bene hearing voices for the past 2 days that make negative comments about him. Denies that the voices are command in nature. Denies visual hallucinations. He does not appear to be responding to internal stimuli. On reevaluation later in the morning, patient states that he is interested in rehab and reported conditional suicidality, indicating that if he could get into rehab, SI would resolve. Attempts were made to get patient placed in rehab, but beds were full. Peer support was consulted who recommended that patient call [facility] tomorrow AM and was given resources. Patient asked about using the phone on the unit to make the call tomorrow morning. Informed patient that he would be discharged today and advised he call [facility] tomorrow. Patient become upset and made conditional suicidal comments. He then requested a bus pass.     Substance abuse is the pt's primary issue and  substance use disorders are best managed in alternative settings, such as residential rehab,  and do not necessitate an inpt psych admission. Patient did not report SI as the main reason for hospital presentation to me this morning which is not c/w someone who presents to the ER claiming to want help for SI. Instead, he identified drug use as being his primary issue. . Additionally, patient has made threats of conditional suicidality (ie being suicidal if he does not get into rehab) which is, again, not c/w  someone who presents to the ER wanting help for SI. Patient will be discharged with resources; recommended he call [facility] tomorrow as was advised by peer support specialist.  Total Time spent with patient: 20 minutes  Past Psychiatric History: see H&P Past Medical History: History reviewed. No pertinent past medical history.  Past Surgical History:  Procedure Laterality Date  . HERNIA REPAIR     Family History: History reviewed. No pertinent family history. 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: see H&P Social History:  Social History   Substance and Sexual Activity  Alcohol Use Yes   Comment: Occasionally      Social History   Substance and Sexual Activity  Drug Use Yes  . Types: Marijuana, Cocaine, Heroin    Social History   Socioeconomic History  . Marital status: Single    Spouse name: Not on file  . Number of children: Not on file  . Years of education: Not on file  . Highest education level: Not on file  Occupational History  . Not on file  Tobacco Use  . Smoking status: Current Some Day Smoker    Packs/day: 0.25    Years: 20.00    Pack years: 5.00    Types: Cigarettes  . Smokeless tobacco: Never Used  Vaping Use  . Vaping Use: Every day  Substance and Sexual Activity  . Alcohol use: Yes    Comment: Occasionally   . Drug use: Yes    Types: Marijuana, Cocaine, Heroin  . Sexual activity: Yes    Birth control/protection: Condom  Other Topics Concern  . Not on file  Social History Narrative  . Not on file   Social Determinants of Health   Financial Resource Strain:   . Difficulty of Paying Living Expenses: Not on file  Food Insecurity:   . Worried About Programme researcher, broadcasting/film/video in the Last Year: Not on file  . Ran Out of Food in the Last Year: Not on file  Transportation Needs:   . Lack of Transportation (Medical): Not on file  . Lack of Transportation (Non-Medical): Not on file  Physical Activity:   . Days of Exercise per Week: Not on file  . Minutes of  Exercise per Session: Not on file  Stress:   . Feeling of Stress : Not on file  Social Connections:   . Frequency of Communication with Friends and Family: Not on file  . Frequency of Social Gatherings with Friends and Family: Not on file  . Attends Religious Services: Not on file  . Active Member of Clubs or Organizations: Not on file  . Attends Banker Meetings: Not on file  . Marital Status: Not on file   SDOH:  SDOH Screenings   Alcohol Screen:   . Last Alcohol Screening Score (AUDIT): Not on file  Depression (PHQ2-9):   . PHQ-2 Score: Not on file  Financial Resource Strain:   . Difficulty of Paying Living Expenses: Not on file  Food Insecurity:   . Worried About Programme researcher, broadcasting/film/video in the Last Year: Not on file  .  Ran Out of Food in the Last Year: Not on file  Housing:   . Last Housing Risk Score: Not on file  Physical Activity:   . Days of Exercise per Week: Not on file  . Minutes of Exercise per Session: Not on file  Social Connections:   . Frequency of Communication with Friends and Family: Not on file  . Frequency of Social Gatherings with Friends and Family: Not on file  . Attends Religious Services: Not on file  . Active Member of Clubs or Organizations: Not on file  . Attends Banker Meetings: Not on file  . Marital Status: Not on file  Stress:   . Feeling of Stress : Not on file  Tobacco Use: High Risk  . Smoking Tobacco Use: Current Some Day Smoker  . Smokeless Tobacco Use: Never Used  Transportation Needs:   . Freight forwarder (Medical): Not on file  . Lack of Transportation (Non-Medical): Not on file    Has this patient used any form of tobacco in the last 30 days? (Cigarettes, Smokeless Tobacco, Cigars, and/or Pipes) Prescription not provided because: n/a  Current Medications:  Current Facility-Administered Medications  Medication Dose Route Frequency Provider Last Rate Last Admin  . acetaminophen (TYLENOL) tablet 650  mg  650 mg Oral Q6H PRN Jackelyn Poling, NP      . alum & mag hydroxide-simeth (MAALOX/MYLANTA) 200-200-20 MG/5ML suspension 30 mL  30 mL Oral Q4H PRN Nira Conn A, NP      . gabapentin (NEURONTIN) capsule 300 mg  300 mg Oral TID Nira Conn A, NP   300 mg at 04/25/20 1023  . hydrOXYzine (ATARAX/[name]) tablet 25 mg  25 mg Oral Q6H PRN Nira Conn A, NP      . magnesium hydroxide (MILK OF MAGNESIA) suspension 30 mL  30 mL Oral Daily PRN Nira Conn A, NP      . QUEtiapine (SEROQUEL) tablet 25 mg  25 mg Oral QHS Nira Conn A, NP      . sertraline (ZOLOFT) tablet 25 mg  25 mg Oral Daily Nira Conn A, NP   25 mg at 04/25/20 1024  . [START ON 04/26/2020] thiamine tablet 100 mg  100 mg Oral Daily Jackelyn Poling, NP       No current outpatient medications on file.    PTA Medications: (Not in a hospital admission)   Musculoskeletal  Strength & Muscle Tone: within normal limits Gait & Station: normal Patient leans: N/A  Psychiatric Specialty Exam  Presentation  General Appearance: Appropriate for Environment;Fairly Groomed  Eye Contact:Good  Speech:Clear and Coherent;Normal Rate  Speech Volume:Normal  Handedness:Right   Mood and Affect  Mood:Anxious  Affect:Appropriate;Congruent;Constricted   Thought Process  Thought Processes:Coherent;Goal Directed;Linear  Descriptions of Associations:Intact  Orientation:Full (Time, Place and Person)  Thought Content:WDL  Hallucinations:Hallucinations: None Description of Auditory Hallucinations: hear voices that make negative comments about him  Ideas of Reference:None  Suicidal Thoughts:Suicidal Thoughts: No SI Active Intent and/or Plan: With Intent;With Plan  Homicidal Thoughts:Homicidal Thoughts: No   Sensorium  Memory:Immediate Good;Recent Good;Remote Good  Judgment:Fair  Insight:Fair   Executive Functions  Concentration:Good  Attention Span:Good  Recall:Good  Fund of  Knowledge:Good  Language:Good   Psychomotor Activity  Psychomotor Activity:Psychomotor Activity: Normal   Assets  Assets:Communication Skills;Housing   Sleep  Sleep:Sleep: Fair   Physical Exam  Physical Exam Constitutional:      Appearance: Normal appearance. He is normal weight.  Eyes:     Extraocular Movements:  Extraocular movements intact.  Pulmonary:     Effort: Pulmonary effort is normal.  Neurological:     Mental Status: He is alert.    Review of Systems  Constitutional: Negative for chills and fever.  Psychiatric/Behavioral: Positive for substance abuse. Negative for hallucinations.   Blood pressure 96/70, pulse 63, temperature 98 F (36.7 C), temperature source Oral, resp. rate 18, SpO2 99 %. There is no height or weight on file to calculate BMI.  Demographic Factors:  Male and Living alone  Loss Factors: NA  Historical Factors: NA  Risk Reduction Factors:   NA  Continued Clinical Symptoms:  Alcohol/Substance Abuse/Dependencies  Cognitive Features That Contribute To Risk:  Polarized thinking    Suicide Risk:  Minimal: No identifiable suicidal ideation.  Patients presenting with no risk factors but with morbid ruminations; may be classified as minimal risk based on the severity of the depressive symptoms  Plan Of Care/Follow-up recommendations:  Activity:  as tolerated Diet:  regular Other:     Patient has been instructed & cautioned: To not engage in alcohol and or illegal drug use while on prescription medicines. In the event of worsening symptoms, patient is instructed to call the crisis hotline, 911 and or go to the nearest ED for appropriate evaluation and treatment of symptoms. To follow-up with his/her primary care provider for your other medical issues, concerns and or health care needs.     Disposition: home; advised to contact rehab in the AM for bed availability  Estella HuskKatherine S Aliegha Paullin, MD 04/25/2020, 3:21 PM

## 2020-04-25 NOTE — Patient Outreach (Signed)
ED Peer Support Specialist Patient Intake (Complete at intake & 30-60 Day Follow-up)  Name: Richard Moon  MRN: [id number]  Age: 59 y.o.   Date of Admission: 04/25/2020  Intake: Initial Comments:      Primary Reason Admitted: Suicidal   Lab values: Alcohol/ETOH: Negative Positive UDS? Yes Amphetamines: No Barbiturates: No Benzodiazepines: No Cocaine: Yes Opiates: No Cannabinoids: No  Demographic information: Gender: Male Ethnicity: African American Marital Status: Single Insurance Status: Medicaid Ecologist (Work Neurosurgeon, Physicist, medical, etc.: No Lives with: Alone Living situation: House/Apartment  Reported Patient History: Patient reported health conditions: Depression, Anxiety disorders Patient aware of HIV and hepatitis status: No  In past year, has patient visited ED for any reason? Yes  Number of ED visits:    Reason(s) for visit:    In past year, has patient been hospitalized for any reason? No  Number of hospitalizations:    Reason(s) for hospitalization:    In past year, has patient been arrested? No  Number of arrests:    Reason(s) for arrest:    In past year, has patient been incarcerated? No  Number of incarcerations:    Reason(s) for incarceration:    In past year, has patient received medication-assisted treatment? No  In past year, patient received the following treatments:    In past year, has patient received any harm reduction services? No  Did this include any of the following?    In past year, has patient received care from a mental health provider for diagnosis other than SUD? No  In past year, is this first time patient has overdosed? No  Number of past overdoses:    In past year, is this first time patient has been hospitalized for an overdose? No  Number of hospitalizations for overdose(s):    Is patient currently receiving treatment for a mental health diagnosis? No  Patient reports  experiencing difficulty participating in SUD treatment: No    Most important reason(s) for this difficulty?    Has patient received prior services for treatment? Yes  In past, patient has received services from following agencies:    Plan of Care:  Suggested follow up at these agencies/treatment centers: Other (comment) [facility])  Other information: CPSS met with Pt an was able to complete series of question to better the quality of Pt life. CPSS was able to understand what services Pt was seeking at this time. CPSS explained to Pt that the treatment facilities that Pt are in need of does not have any beds available until the AM. CPSS left Inpatient an Outpatient treatment resource list for Pt to have along with CPSS contact information to follow up with [facility] in the AM for a bed.     Aaron Edelman Hetty Linhart, CPSS  04/25/2020 2:34 PM

## 2020-04-25 NOTE — ED Provider Notes (Signed)
[facility] EMERGENCY DEPARTMENT Provider Note   CSN: [id number] Arrival date & time: 04/24/20  2245     History Chief Complaint  Patient presents with  . Suicidal    Richard Moon is a 59 y.o. male with a hx of depression, tobacco abuse, & cocaine use who presents to the ED with complaints of suicidal ideation x 2 days. Patient states that he has been feeling suicidal with plan to  jump off of building at a construction site. No attempts made.  No alleviating/aggravating factors. Reports he has been hearing voices telling him that he is no good.  States the last time he felt this way was about a year ago. Admits to EtOH & cocaine use. Drinks 6 pack of 16 ounce beers per day- did have ETOH & cocaine earlier today. Denies HI, visual hallucinations, chest pain, dyspnea, fever, or abdominal pain.   HPI     History reviewed. No pertinent past medical history.  Patient Active Problem List   Diagnosis Date Noted  . Cocaine dependence with cocaine-induced mood disorder (HCC)   . MDD (major depressive disorder), severe (HCC) 03/12/2018    Past Surgical History:  Procedure Laterality Date  . HERNIA REPAIR         No family history on file.  Social History   Tobacco Use  . Smoking status: Current Some Day Smoker    Packs/day: 0.25    Years: 20.00    Pack years: 5.00    Types: Cigarettes  . Smokeless tobacco: Never Used  Vaping Use  . Vaping Use: Every day  Substance Use Topics  . Alcohol use: Yes    Comment: Occasionally   . Drug use: Yes    Types: Marijuana, Cocaine, Heroin    Home Medications Prior to Admission medications   Medication Sig Start Date End Date Taking? Authorizing Provider  albuterol (PROVENTIL HFA;VENTOLIN HFA) 108 (90 Base) MCG/ACT inhaler Inhale 2 puffs into the lungs every 6 (six) hours. For shortness of breathing and wheezing 03/15/18   Money, Gerlene Burdock, FNP  hydrOXYzine (ATARAX/[name]) 25 MG tablet Take 1 tablet (25 mg total)  by mouth every 6 (six) hours as needed for anxiety. 03/15/18   Money, Gerlene Burdock, FNP  QUEtiapine (SEROQUEL) 25 MG tablet Take 1 tablet (25 mg total) by mouth at bedtime. 03/15/18   Money, Gerlene Burdock, FNP  sertraline (ZOLOFT) 50 MG tablet Take 1 tablet (50 mg total) by mouth daily. For mood control 03/16/18   Money, Gerlene Burdock, FNP    Allergies    Patient has no known allergies.  Review of Systems   Review of Systems  Constitutional: Negative for chills and fever.  Respiratory: Negative for shortness of breath.   Cardiovascular: Negative for chest pain.  Gastrointestinal: Negative for abdominal pain and vomiting.  Neurological: Negative for syncope.  Psychiatric/Behavioral: Positive for hallucinations and suicidal ideas.  All other systems reviewed and are negative.   Physical Exam Updated Vital Signs BP 126/88 (BP Location: Left Arm)   Pulse (!) 56   Temp 98.4 F (36.9 C) (Oral)   Resp 16   Ht 5\' 10"  (1.778 m)   Wt 80 kg   SpO2 99%   BMI 25.31 kg/m   Physical Exam Vitals and nursing note reviewed.  Constitutional:      General: He is not in acute distress.    Appearance: He is well-developed. He is not toxic-appearing.  HENT:     Head: Normocephalic and atraumatic.  Eyes:  General:        Right eye: No discharge.        Left eye: No discharge.     Conjunctiva/sclera: Conjunctivae normal.  Cardiovascular:     Rate and Rhythm: Normal rate and regular rhythm.  Pulmonary:     Effort: Pulmonary effort is normal. No respiratory distress.     Breath sounds: Normal breath sounds. No wheezing, rhonchi or rales.  Abdominal:     General: There is no distension.     Palpations: Abdomen is soft.     Tenderness: There is no abdominal tenderness.  Musculoskeletal:     Cervical back: Neck supple.  Skin:    General: Skin is warm and dry.     Findings: No rash.  Neurological:     Mental Status: He is alert.     Comments: Clear speech.   Psychiatric:        Thought Content:  Thought content includes suicidal ideation. Thought content does not include homicidal ideation. Thought content includes suicidal plan.     Comments: Reports auditory hallucinations.      ED Results / Procedures / Treatments   Labs (all labs ordered are listed, but only abnormal results are displayed) Labs Reviewed  COMPREHENSIVE METABOLIC PANEL - Abnormal; Notable for the following components:      Result Value   Potassium 3.4 (*)    Creatinine, Ser 1.45 (*)    GFR, Estimated 56 (*)    All other components within normal limits  SALICYLATE LEVEL - Abnormal; Notable for the following components:   Salicylate Lvl <7.0 (*)    All other components within normal limits  ACETAMINOPHEN LEVEL - Abnormal; Notable for the following components:   Acetaminophen (Tylenol), Serum <10 (*)    All other components within normal limits  RAPID URINE DRUG SCREEN, HOSP PERFORMED - Abnormal; Notable for the following components:   Cocaine POSITIVE (*)    All other components within normal limits  ETHANOL  CBC    EKG None  Radiology No results found.  Procedures Procedures (including critical care time)  Medications Ordered in ED Medications - No data to display  ED Course  I have reviewed the triage vital signs and the nursing notes.  Pertinent labs & imaging results that were available during my care of the patient were reviewed by me and considered in my medical decision making (see chart for details).    MDM Rules/Calculators/A&P                         Patient presents to the ED with complaints of SI & hallucinations.  Nontoxic, vitals without significant abnormality.   Additional history obtained:  Additional history obtained from chart review & nursing note review.  Lab Tests:  I reviewed and interpreted labs, which included:  CBC, CMP, ethanol level, salicylate level, acetaminophen level, and UDS: Hypokalemia will be orally replaced. Creatinine similar to prior. UDS  consistent with patient's reported drug use.  Ethanol level is not elevated, patient denies history of withdrawal, he does not appear to be having acute withdrawal at this time.  Patient medically cleared, disposition per behavioral health.  01:35: CONSULT: Discussed with Nira Conn NP-can consult TTS while pending Covid swab, can likely be transferred to [facility] once COVID test is back.   Transfer to [facility]  Portions of this note were generated with [employer]. Dictation errors may occur despite best attempts at proofreading.  Final Clinical Impression(s) /  ED Diagnoses Final diagnoses:  Suicidal ideation    Rx / DC Orders ED Discharge Orders    None       Cherly Anderson, PA-C 04/25/20 0524    Palumbo, April, MD 04/25/20 [phone]

## 2020-04-25 NOTE — ED Notes (Signed)
Continue to endorse SI no plan. Pt states, "I feel a little better than I did last night but I'm still having suicidal thoughts". Denies HI/AVH. Denies withdrawal sx from cocaine. Safety maintained.

## 2020-04-25 NOTE — ED Notes (Signed)
Psych packet completed, belongings locked away, pt has glasses at bedside. Pt calm and cooperative, si precautions in place.

## 2020-04-26 ENCOUNTER — Other Ambulatory Visit: Payer: Self-pay

## 2020-04-26 ENCOUNTER — Encounter (HOSPITAL_COMMUNITY): Payer: Self-pay | Admitting: Emergency Medicine

## 2020-04-26 ENCOUNTER — Emergency Department (HOSPITAL_COMMUNITY)
Admission: EM | Admit: 2020-04-26 | Discharge: 2020-04-27 | Disposition: A | Discharge status: Home / Self Care | Payer: Self-pay | Attending: Emergency Medicine | Admitting: Emergency Medicine

## 2020-04-26 DIAGNOSIS — F17210 Nicotine dependence, cigarettes, uncomplicated: Secondary | ICD-10-CM | POA: Insufficient documentation

## 2020-04-26 DIAGNOSIS — R45851 Suicidal ideations: Secondary | ICD-10-CM | POA: Insufficient documentation

## 2020-04-26 DIAGNOSIS — R45850 Homicidal ideations: Secondary | ICD-10-CM | POA: Insufficient documentation

## 2020-04-26 DIAGNOSIS — F329 Major depressive disorder, single episode, unspecified: Secondary | ICD-10-CM | POA: Insufficient documentation

## 2020-04-26 DIAGNOSIS — Z20822 Contact with and (suspected) exposure to covid-19: Secondary | ICD-10-CM | POA: Insufficient documentation

## 2020-04-26 LAB — CBC
HCT: 48.8 % (ref 39.0–52.0)
Hemoglobin: 15.7 g/dL (ref 13.0–17.0)
MCH: 32.4 pg (ref 26.0–34.0)
MCHC: 32.2 g/dL (ref 30.0–36.0)
MCV: 100.8 fL — ABNORMAL HIGH (ref 80.0–100.0)
Platelets: 220 10*3/uL (ref 150–400)
RBC: 4.84 MIL/uL (ref 4.22–5.81)
RDW: 13.4 % (ref 11.5–15.5)
WBC: 4.7 10*3/uL (ref 4.0–10.5)
nRBC: 0 % (ref 0.0–0.2)

## 2020-04-26 LAB — COMPREHENSIVE METABOLIC PANEL
ALT: 17 U/L (ref 0–44)
AST: 24 U/L (ref 15–41)
Albumin: 4.1 g/dL (ref 3.5–5.0)
Alkaline Phosphatase: 45 U/L (ref 38–126)
Anion gap: 8 (ref 5–15)
BUN: 17 mg/dL (ref 6–20)
CO2: 29 mmol/L (ref 22–32)
Calcium: 9.9 mg/dL (ref 8.9–10.3)
Chloride: 104 mmol/L (ref 98–111)
Creatinine, Ser: 1.42 mg/dL — ABNORMAL HIGH (ref 0.61–1.24)
GFR, Estimated: 58 mL/min — ABNORMAL LOW (ref >60)
Glucose, Bld: 122 mg/dL — ABNORMAL HIGH (ref 70–99)
Potassium: 3.7 mmol/L (ref 3.5–5.1)
Sodium: 141 mmol/L (ref 135–145)
Total Bilirubin: 0.4 mg/dL (ref 0.3–1.2)
Total Protein: 7.1 g/dL (ref 6.5–8.1)

## 2020-04-26 LAB — ACETAMINOPHEN LEVEL: Acetaminophen (Tylenol), Serum: <10 ug/mL — ABNORMAL LOW (ref 10–30)

## 2020-04-26 LAB — ETHANOL: Alcohol, Ethyl (B): <10 mg/dL (ref <10)

## 2020-04-26 LAB — SALICYLATE LEVEL: Salicylate Lvl: <7 mg/dL — ABNORMAL LOW (ref 7.0–30.0)

## 2020-04-26 NOTE — ED Triage Notes (Signed)
Pt to ED with c/o being SI and HI   Sts he lost his job and feels like killing his boss and killing himself

## 2020-04-27 ENCOUNTER — Encounter (HOSPITAL_COMMUNITY): Payer: Self-pay | Admitting: Emergency Medicine

## 2020-04-27 ENCOUNTER — Ambulatory Visit (HOSPITAL_COMMUNITY)
Admission: EM | Admit: 2020-04-27 | Discharge: 2020-04-27 | Disposition: A | Discharge status: Home / Self Care | Payer: No Payment, Other | Attending: Family | Admitting: Family

## 2020-04-27 ENCOUNTER — Other Ambulatory Visit: Payer: Self-pay

## 2020-04-27 DIAGNOSIS — F1424 Cocaine dependence with cocaine-induced mood disorder: Secondary | ICD-10-CM | POA: Diagnosis not present

## 2020-04-27 DIAGNOSIS — F17210 Nicotine dependence, cigarettes, uncomplicated: Secondary | ICD-10-CM | POA: Insufficient documentation

## 2020-04-27 DIAGNOSIS — Z560 Unemployment, unspecified: Secondary | ICD-10-CM | POA: Insufficient documentation

## 2020-04-27 DIAGNOSIS — Z733 Stress, not elsewhere classified: Secondary | ICD-10-CM | POA: Insufficient documentation

## 2020-04-27 DIAGNOSIS — F329 Major depressive disorder, single episode, unspecified: Secondary | ICD-10-CM | POA: Diagnosis not present

## 2020-04-27 DIAGNOSIS — Z811 Family history of alcohol abuse and dependence: Secondary | ICD-10-CM | POA: Insufficient documentation

## 2020-04-27 DIAGNOSIS — F1010 Alcohol abuse, uncomplicated: Secondary | ICD-10-CM | POA: Diagnosis not present

## 2020-04-27 DIAGNOSIS — F1290 Cannabis use, unspecified, uncomplicated: Secondary | ICD-10-CM | POA: Diagnosis not present

## 2020-04-27 DIAGNOSIS — Z751 Person awaiting admission to adequate facility elsewhere: Secondary | ICD-10-CM | POA: Insufficient documentation

## 2020-04-27 DIAGNOSIS — Z5900 Homelessness unspecified: Secondary | ICD-10-CM | POA: Insufficient documentation

## 2020-04-27 LAB — RAPID URINE DRUG SCREEN, HOSP PERFORMED
Amphetamines: NOT DETECTED
Barbiturates: NOT DETECTED
Benzodiazepines: NOT DETECTED
Cocaine: POSITIVE — AB
Opiates: NOT DETECTED
Tetrahydrocannabinol: NOT DETECTED

## 2020-04-27 LAB — RESPIRATORY PANEL BY RT PCR (FLU A&B, COVID)
Influenza A by PCR: NEGATIVE
Influenza B by PCR: NEGATIVE
SARS Coronavirus 2 by RT PCR: NEGATIVE

## 2020-04-27 MED ORDER — ACETAMINOPHEN 325 MG PO TABS
650.0000 mg | ORAL_TABLET | Freq: Once | ORAL | Status: AC
  Administered 2020-04-27: 650 mg via ORAL
  Filled 2020-04-27: qty 2

## 2020-04-27 NOTE — ED Provider Notes (Signed)
Behavioral Health Admission H&P [facility] & OBS)  Date: 04/27/20 Patient Name: Richard Moon MRN: [id number] Chief Complaint:  Chief Complaint  Patient presents with  . Suicidal   Diagnoses: Cocaine dependence with cocaine-induced mood disorder (HCC)   MDD (major depressive disorder), severe (HCC)   EPP:IRJJOA Forness is a 59 y.o. male with a history of major depressive disorder, alcohol use disorder, cocaine use disorder, and marijuana use who presents to [facility] emergency department with a chief complaint of suicidal and homicidal ideation.The patient was tranfere to [facility] from [facility] ED.   The patient reports that he has been increasingly suicidal over the last 3 to 4 days.  However, symptoms worsened earlier today after he was fired from his job, where he worked for the previous year.  He plans to shoot himself with a gun.  Although he does not have a gun at his home, he states that he has friends who can access firearms.  He also has been feeling homicidal towards his boss, who fired him earlier today.  He denies AVH.  He drinks approximately 6-7 beers daily.  He endorses cocaine use but has not had any use for the last 24 hours.  He denies other illicit or recreational substance use. The patient and medical records provide the history.  PHQ 2-9:     ED from 04/24/2020 in [facility] EMERGENCY DEPARTMENT Admission (Discharged) [facility] from 03/11/2018 in [facility] EMERGENCY DEPARTMENT  C-SSRS RISK CATEGORY High Risk High Risk High Risk       Total Time spent with patient: 30 minutes  Musculoskeletal  Strength & Muscle Tone: within normal limits Gait & Station: normal Patient leans: N/A  Psychiatric Specialty Exam  Presentation General Appearance: Appropriate for Environment  Eye Contact:Good  Speech:Clear and Coherent  Speech Volume:Normal  Handedness:Right   Mood and Affect   Mood:Anxious;Irritable  Affect:Appropriate;Congruent;Constricted   Thought Process  Thought Processes:Coherent;Goal Directed;Linear  Descriptions of Associations:Intact  Orientation:Full (Time, Place and Person)  Thought Content:WDL  Hallucinations:Hallucinations: None  Ideas of Reference:None  Suicidal Thoughts:Suicidal Thoughts: Yes, Passive SI Active Intent and/or Plan: With Intent;With Plan;Without Means to Carry Out SI Passive Intent and/or Plan: Without Intent;With Plan  Homicidal Thoughts:Homicidal Thoughts: Yes, Active HI Active Intent and/or Plan: With Intent;With Plan   Sensorium  Memory:Immediate Good;Recent Good;Remote Good  Judgment:Fair  Insight:Lacking   Executive Functions  Concentration:Good  Attention Span:Good  Recall:Good  Fund of Knowledge:Good  Language:Good   Psychomotor Activity  Psychomotor Activity:Psychomotor Activity: Normal   Assets  Assets:Communication Skills;Housing   Sleep  Sleep:Sleep: Fair Number of Hours of Sleep: 4   Physical Exam Vitals and nursing note reviewed.  Constitutional:      Appearance: Normal appearance. He is normal weight.  HENT:     Right Ear: Tympanic membrane normal.     Left Ear: Tympanic membrane normal.     Nose: Nose normal.  Cardiovascular:     Rate and Rhythm: Normal rate.  Pulmonary:     Effort: Pulmonary effort is normal.  Musculoskeletal:        General: Normal range of motion.     Cervical back: Normal range of motion and neck supple.  Skin:    General: Skin is warm.  Neurological:     Mental Status: He is alert and oriented to person, place, and time. Mental status is at baseline.  Psychiatric:        Attention and Perception: Attention  and perception normal.        Mood and Affect: Mood is anxious and depressed.        Speech: Speech is delayed.        Behavior: Behavior is agitated. Behavior is cooperative.        Thought Content: Thought content normal.         Cognition and Memory: Cognition and memory normal.        Judgment: Judgment is impulsive.    Review of Systems  Psychiatric/Behavioral: Positive for depression and suicidal ideas.  All other systems reviewed and are negative.   Blood pressure 104/75, pulse (!) 51, temperature (!) 96.8 F (36 C), temperature source Oral, resp. rate 18, SpO2 100 %. There is no height or weight on file to calculate BMI.  Past Psychiatric History:   Is the patient at risk to self? No  Has the patient been a risk to self in the past 6 months? No .    Has the patient been a risk to self within the distant past? No   Is the patient a risk to others? No   Has the patient been a risk to others in the past 6 months? No   Has the patient been a risk to others within the distant past? No   Past Medical History: History reviewed. No pertinent past medical history.  Past Surgical History:  Procedure Laterality Date  . HERNIA REPAIR      Family History: History reviewed. No pertinent family history.  Social History:  Social History   Socioeconomic History  . Marital status: Single    Spouse name: Not on file  . Number of children: Not on file  . Years of education: Not on file  . Highest education level: Not on file  Occupational History  . Not on file  Tobacco Use  . Smoking status: Current Some Day Smoker    Packs/day: 0.25    Years: 20.00    Pack years: 5.00    Types: Cigarettes  . Smokeless tobacco: Never Used  Vaping Use  . Vaping Use: Every day  Substance and Sexual Activity  . Alcohol use: Yes    Comment: Occasionally   . Drug use: Yes    Types: Marijuana, Cocaine, Heroin  . Sexual activity: Yes    Birth control/protection: Condom  Other Topics Concern  . Not on file  Social History Narrative  . Not on file   Social Determinants of Health   Financial Resource Strain:   . Difficulty of Paying Living Expenses: Not on file  Food Insecurity:   . Worried About Programme researcher, broadcasting/film/video  in the Last Year: Not on file  . Ran Out of Food in the Last Year: Not on file  Transportation Needs:   . Lack of Transportation (Medical): Not on file  . Lack of Transportation (Non-Medical): Not on file  Physical Activity:   . Days of Exercise per Week: Not on file  . Minutes of Exercise per Session: Not on file  Stress:   . Feeling of Stress : Not on file  Social Connections:   . Frequency of Communication with Friends and Family: Not on file  . Frequency of Social Gatherings with Friends and Family: Not on file  . Attends Religious Services: Not on file  . Active Member of Clubs or Organizations: Not on file  . Attends Banker Meetings: Not on file  . Marital Status: Not on file  Intimate  Partner Violence:   . Fear of Current or Ex-Partner: Not on file  . Emotionally Abused: Not on file  . Physically Abused: Not on file  . Sexually Abused: Not on file    SDOH:  SDOH Screenings   Alcohol Screen:   . Last Alcohol Screening Score (AUDIT): Not on file  Depression (PHQ2-9):   . PHQ-2 Score: Not on file  Financial Resource Strain:   . Difficulty of Paying Living Expenses: Not on file  Food Insecurity:   . Worried About Programme researcher, broadcasting/film/videounning Out of Food in the Last Year: Not on file  . Ran Out of Food in the Last Year: Not on file  Housing:   . Last Housing Risk Score: Not on file  Physical Activity:   . Days of Exercise per Week: Not on file  . Minutes of Exercise per Session: Not on file  Social Connections:   . Frequency of Communication with Friends and Family: Not on file  . Frequency of Social Gatherings with Friends and Family: Not on file  . Attends Religious Services: Not on file  . Active Member of Clubs or Organizations: Not on file  . Attends BankerClub or Organization Meetings: Not on file  . Marital Status: Not on file  Stress:   . Feeling of Stress : Not on file  Tobacco Use: High Risk  . Smoking Tobacco Use: Current Some Day Smoker  . Smokeless Tobacco Use:  Never Used  Transportation Needs:   . Freight forwarderLack of Transportation (Medical): Not on file  . Lack of Transportation (Non-Medical): Not on file    Last Labs:  Admission on 04/26/2020, Discharged on 04/27/2020  Component Date Value Ref Range Status  . Sodium 04/26/2020 141  135 - 145 mmol/L Final  . Potassium 04/26/2020 3.7  3.5 - 5.1 mmol/L Final  . Chloride 04/26/2020 104  98 - 111 mmol/L Final  . CO2 04/26/2020 29  22 - 32 mmol/L Final  . Glucose, Bld 04/26/2020 122* 70 - 99 mg/dL Final   Glucose reference range applies only to samples taken after fasting for at least 8 hours.  . BUN 04/26/2020 17  6 - 20 mg/dL Final  . Creatinine, Ser 04/26/2020 1.42* 0.61 - 1.24 mg/dL Final  . Calcium 16/10/960411/10/2019 9.9  8.9 - 10.3 mg/dL Final  . Total Protein 04/26/2020 7.1  6.5 - 8.1 g/dL Final  . Albumin 54/09/811911/10/2019 4.1  3.5 - 5.0 g/dL Final  . AST 14/78/295611/10/2019 24  15 - 41 U/L Final  . ALT 04/26/2020 17  0 - 44 U/L Final  . Alkaline Phosphatase 04/26/2020 45  38 - 126 U/L Final  . Total Bilirubin 04/26/2020 0.4  0.3 - 1.2 mg/dL Final  . GFR, Estimated 04/26/2020 58* >60 mL/min Final   Comment: (NOTE) Calculated using the CKD-EPI Creatinine Equation (2021)   . Anion gap 04/26/2020 8  5 - 15 Final   Performed at [facility], [address] [place], [place] [address]  . Alcohol, Ethyl (B) 04/26/2020 <10  <10 mg/dL Final   Comment: (NOTE) Lowest detectable limit for serum alcohol is 10 mg/dL.  For medical purposes only. Performed at [facility], [address] [place], [place] [address]   . Salicylate Lvl 04/26/2020 <7.0* 7.0 - 30.0 mg/dL Final   Performed at [facility], [address] [place], [place] [address]  . Acetaminophen (Tylenol), Serum 04/26/2020 <10* 10 - 30 ug/mL Final   Comment: (NOTE) Therapeutic concentrations vary significantly.  A range of 10-30 ug/mL  may be an effective concentration for many patients. However, some  are best treated at concentrations  outside of this range. Acetaminophen concentrations >150 ug/mL at 4 hours after ingestion  and >50 ug/mL at 12 hours after ingestion are often associated with  toxic reactions.  Performed at [facility], [address] [place], [place] [address]   . WBC 04/26/2020 4.7  4.0 - 10.5 K/uL Final  . RBC 04/26/2020 4.84  4.22 - 5.81 MIL/uL Final  . Hemoglobin 04/26/2020 15.7  13.0 - 17.0 g/dL Final  . HCT 60/45/4098 48.8  39 - 52 % Final  . MCV 04/26/2020 100.8* 80.0 - 100.0 fL Final  . MCH 04/26/2020 32.4  26.0 - 34.0 pg Final  . MCHC 04/26/2020 32.2  30.0 - 36.0 g/dL Final  . RDW 11/91/4782 13.4  11.5 - 15.5 % Final  . Platelets 04/26/2020 220  150 - 400 K/uL Final  . nRBC 04/26/2020 0.0  0.0 - 0.2 % Final   Performed at [facility], [address] [place], [place] [address]  . Opiates 04/26/2020 NONE DETECTED  NONE DETECTED Final  . Cocaine 04/26/2020 POSITIVE* NONE DETECTED Final  . Benzodiazepines 04/26/2020 NONE DETECTED  NONE DETECTED Final  . Amphetamines 04/26/2020 NONE DETECTED  NONE DETECTED Final  . Tetrahydrocannabinol 04/26/2020 NONE DETECTED  NONE DETECTED Final  . Barbiturates 04/26/2020 NONE DETECTED  NONE DETECTED Final   Comment: (NOTE) DRUG SCREEN FOR MEDICAL PURPOSES ONLY.  IF CONFIRMATION IS NEEDED FOR ANY PURPOSE, NOTIFY LAB WITHIN 5 DAYS.  LOWEST DETECTABLE LIMITS FOR URINE DRUG SCREEN Drug Class                     Cutoff (ng/mL) Amphetamine and metabolites    1000 Barbiturate and metabolites    200 Benzodiazepine                 200 Tricyclics and metabolites     300 Opiates and metabolites        300 Cocaine and metabolites        300 THC                            50 Performed at [facility], [address], [place], [place] [address]   . SARS Coronavirus 2 by RT PCR 04/27/2020 NEGATIVE  NEGATIVE Final   Comment: (NOTE) SARS-CoV-2 target nucleic acids are NOT DETECTED.  The SARS-CoV-2 RNA is generally detectable in upper  respiratoy specimens during the acute phase of infection. The lowest concentration of SARS-CoV-2 viral copies this assay can detect is 131 copies/mL. A negative result does not preclude SARS-Cov-2 infection and should not be used as the sole basis for treatment or other patient management decisions. A negative result may occur with  improper specimen collection/handling, submission of specimen other than nasopharyngeal swab, presence of viral mutation(s) within the areas targeted by this assay, and inadequate number of viral copies (<131 copies/mL). A negative result must be combined with clinical observations, patient history, and epidemiological information. The expected result is Negative.  Fact Sheet for Patients:  https://www.moore.com/  Fact Sheet for Healthcare Providers:  https://www.young.biz/  This test is no                          t yet approved or cleared by the [place] and  has been authorized for detection and/or diagnosis of SARS-CoV-2 by FDA under an Emergency Use Authorization (EUA). This EUA will remain  in effect (meaning this test can be used) for the duration of the COVID-19 declaration under Section 564(b)(1) of the Act, 21 U.S.C. section 360bbb-3(b)(1), unless the authorization is terminated or revoked sooner.    . Influenza A by PCR 04/27/2020 NEGATIVE  NEGATIVE Final  . Influenza B by PCR 04/27/2020 NEGATIVE  NEGATIVE Final   Comment: (NOTE) The Xpert Xpress SARS-CoV-2/FLU/RSV assay is intended as an aid in  the diagnosis of influenza from Nasopharyngeal swab specimens and  should not be used as a sole basis for treatment. Nasal washings and  aspirates are unacceptable for Xpert Xpress SARS-CoV-2/FLU/RSV  testing.  Fact Sheet for Patients: https://www.moore.com/  Fact Sheet for Healthcare Providers: https://www.young.biz/  This test is not yet approved or  cleared by the [place] FDA and  has been authorized for detection and/or diagnosis of SARS-CoV-2 by  FDA under an Emergency Use Authorization (EUA). This EUA will remain  in effect (meaning this test can be used) for the duration of the  Covid-19 declaration under Section 564(b)(1) of the Act, 21  U.S.C. section 360bbb-3(b)(1), unless the authorization is  terminated or revoked. Performed at [facility], [address] [place], [place] [address]   Admission on 04/24/2020, Discharged on 04/25/2020  Component Date Value Ref Range Status  . Sodium 04/24/2020 140  135 - 145 mmol/L Final  . Potassium 04/24/2020 3.4* 3.5 - 5.1 mmol/L Final  . Chloride 04/24/2020 105  98 - 111 mmol/L Final  . CO2 04/24/2020 24  22 - 32 mmol/L Final  . Glucose, Bld 04/24/2020 88  70 - 99 mg/dL Final   Glucose reference range applies only to samples taken after fasting for at least 8 hours.  . BUN 04/24/2020 16  6 - 20 mg/dL Final  . Creatinine, Ser 04/24/2020 1.45* 0.61 - 1.24 mg/dL Final  . Calcium 28/31/5176 9.5  8.9 - 10.3 mg/dL Final  . Total Protein 04/24/2020 6.7  6.5 - 8.1 g/dL Final  . Albumin 16/12/3708 4.2  3.5 - 5.0 g/dL Final  . AST 62/69/4854 26  15 - 41 U/L Final  . ALT 04/24/2020 15  0 - 44 U/L Final  . Alkaline Phosphatase 04/24/2020 44  38 - 126 U/L Final  . Total Bilirubin 04/24/2020 0.9  0.3 - 1.2 mg/dL Final  . GFR, Estimated 04/24/2020 56* >60 mL/min Final   Comment: (NOTE) Calculated using the CKD-EPI Creatinine Equation (2021)   . Anion gap 04/24/2020 11  5 - 15 Final   Performed at [facility], [address], [place], [place] [address]  . Alcohol, Ethyl (B) 04/24/2020 <10  <10 mg/dL Final   Comment: (NOTE) Lowest detectable limit for serum alcohol is 10 mg/dL.  For medical purposes only. Performed at [facility], [address] [place], [place] [address]   . Salicylate Lvl 04/24/2020 <7.0* 7.0 - 30.0 mg/dL Final   Performed at [facility], [address] [place], [place] [address]  . Acetaminophen (Tylenol), Serum 04/24/2020 <10* 10 - 30 ug/mL Final   Comment: (NOTE) Therapeutic concentrations vary significantly. A range of 10-30 ug/mL  may be an effective concentration for many patients. However, some  are best treated at concentrations outside of this range. Acetaminophen concentrations >150 ug/mL at 4 hours after ingestion  and >50 ug/mL at 12 hours after ingestion are  often associated with  toxic reactions.  Performed at [facility], [address] [place], [place] [address]   . WBC 04/24/2020 5.3  4.0 - 10.5 K/uL Final  . RBC 04/24/2020 4.59  4.22 - 5.81 MIL/uL Final  . Hemoglobin 04/24/2020 14.9  13.0 - 17.0 g/dL Final  . HCT 20/25/4270 45.7  39 - 52 % Final  . MCV 04/24/2020 99.6  80.0 - 100.0 fL Final  . MCH 04/24/2020 32.5  26.0 - 34.0 pg Final  . MCHC 04/24/2020 32.6  30.0 - 36.0 g/dL Final  . RDW 62/37/6283 13.7  11.5 - 15.5 % Final  . Platelets 04/24/2020 226  150 - 400 K/uL Final  . nRBC 04/24/2020 0.0  0.0 - 0.2 % Final   Performed at [facility] [place] [facility], [address] [place], [place] [address]  . Opiates 04/24/2020 NONE DETECTED  NONE DETECTED Final  . Cocaine 04/24/2020 POSITIVE* NONE DETECTED Final  . Benzodiazepines 04/24/2020 NONE DETECTED  NONE DETECTED Final  . Amphetamines 04/24/2020 NONE DETECTED  NONE DETECTED Final  . Tetrahydrocannabinol 04/24/2020 NONE DETECTED  NONE DETECTED Final  . Barbiturates 04/24/2020 NONE DETECTED  NONE DETECTED Final   Comment: (NOTE) DRUG SCREEN FOR MEDICAL PURPOSES ONLY.  IF CONFIRMATION IS NEEDED FOR ANY PURPOSE, NOTIFY LAB WITHIN 5 DAYS.  LOWEST DETECTABLE LIMITS FOR URINE DRUG SCREEN Drug Class                     Cutoff (ng/mL) Amphetamine and metabolites    1000 Barbiturate and metabolites    200 Benzodiazepine                 200 Tricyclics and metabolites     300 Opiates and metabolites        300 Cocaine and metabolites        300 THC                             50 Performed at [facility], [address], [place], [place] [address]   . SARS Coronavirus 2 by RT PCR 04/25/2020 NEGATIVE  NEGATIVE Final   Comment: (NOTE) SARS-CoV-2 target nucleic acids are NOT DETECTED.  The SARS-CoV-2 RNA is generally detectable in upper respiratoy specimens during the acute phase of infection. The lowest concentration of SARS-CoV-2 viral copies this assay can detect is 131 copies/mL. A negative result does not preclude SARS-Cov-2 infection and should not be used as the sole basis for treatment or other patient management decisions. A negative result may occur with  improper specimen collection/handling, submission of specimen other than nasopharyngeal swab, presence of viral mutation(s) within the areas targeted by this assay, and inadequate number of viral copies (<131 copies/mL). A negative result must be combined with clinical observations, patient history, and epidemiological information. The expected result is Negative.  Fact Sheet for Patients:  https://www.moore.com/  Fact Sheet for Healthcare Providers:  https://www.young.biz/  This test is no                          t yet approved or cleared by the [place] FDA and  has been authorized for detection and/or diagnosis of SARS-CoV-2 by FDA under an Emergency Use Authorization (EUA). This EUA will remain  in effect (meaning this test can be used) for the duration of the COVID-19 declaration under Section 564(b)(1) of the Act, 21  U.S.C. section 360bbb-3(b)(1), unless the authorization is terminated or revoked sooner.    . Influenza A by PCR 04/25/2020 NEGATIVE  NEGATIVE Final  . Influenza B by PCR 04/25/2020 NEGATIVE  NEGATIVE Final   Comment: (NOTE) The Xpert Xpress SARS-CoV-2/FLU/RSV assay is intended as an aid in  the diagnosis of influenza from Nasopharyngeal swab specimens and  should not be used as a sole basis  for treatment. Nasal washings and  aspirates are unacceptable for Xpert Xpress SARS-CoV-2/FLU/RSV  testing.  Fact Sheet for Patients: https://www.moore.com/  Fact Sheet for Healthcare Providers: https://www.young.biz/  This test is not yet approved or cleared by the [place] FDA and  has been authorized for detection and/or diagnosis of SARS-CoV-2 by  FDA under an Emergency Use Authorization (EUA). This EUA will remain  in effect (meaning this test can be used) for the duration of the  Covid-19 declaration under Section 564(b)(1) of the Act, 21  U.S.C. section 360bbb-3(b)(1), unless the authorization is  terminated or revoked. Performed at [facility], [address] [place], [place] [address]     Allergies: Patient has no known allergies.  PTA Medications: (Not in a hospital admission)   Medical Decision Making   Recommendations  Based on my evaluation the patient does not appear to have an emergency medical condition. The patient will remain on OBS and be reassessed in the morning to determine if he meets criteria to be admitted to inpatient unit or d/c in the morning.   Gillermo Murdoch, NP 04/27/20  6:17 AM

## 2020-04-27 NOTE — ED Notes (Signed)
Pt resting with eyes closed. Rise and fall of chest noted. No new issues noted. Will continue to monitor for safety 

## 2020-04-27 NOTE — BH Assessment (Signed)
Comprehensive Clinical Assessment (CCA) Note  04/27/2020 Richard Moon [id number]  Richard Moon 59 year old man who presents voluntary and unaccompanied to [facility]. Per chart, pt was assessed at [facility] on 04/25/2020 and was discharged the next day  (04/26/2020) and to follow up OPT resources that were provided. Clinician asked the pt, "what brought you to the hospital?" Pt reported, he wanted to shoot himself and his boss for firing him. Pt reported, he does not have a gun but he does have access to a gun. Pt denied, AVH, self-injurious behaviors.   Pt denies, use within 24 hours however pt's UDS is positive for cocaine. Pt denies, being linked to OPT resources (medication management and/or counseling.)   Pt presents quiet, awake in scrubs with normal speech. Pt's mood, affect was depressed. Pt's insight and judgement are poor. Pt's thought content was appropriate to mood and circumstances.   Disposition: Richard Moon, PMHNP recommends pt be admitted to [facility] Continuing Observation Unit.   Diagnosis: Substance Induced Mood Disorder (HCC)   *Pt denies, having family, friend supports.*   Chief Complaint:  Chief Complaint  Patient presents with  . Suicidal   Visit Diagnosis:    CCA Screening, Triage and Referral (STR)  Patient Reported Information How did you hear about [place]? Self  Referral name: No data recorded Referral phone number: No data recorded  Whom do you see for routine medical problems? I don't have a doctor  Practice/Facility Name: No data recorded Practice/Facility Phone Number: No data recorded Name of Contact: No data recorded Contact Number: No data recorded Contact Fax Number: No data recorded Prescriber Name: No data recorded Prescriber Address (if known): No data recorded  What Is the Reason for Your Visit/Call Today? No data recorded How Long Has This Been Causing You Problems? 1 wk - 1 month  What Do You Feel Would Help You the Most Today?  Therapy;Medication   Have You Recently Been in Any Inpatient Treatment (Hospital/Detox/Crisis Center/28-Day Program)? No  Name/Location of Program/Hospital:No data recorded How Long Were You There? No data recorded When Were You Discharged? No data recorded  Have You Ever Received Services From [facility] Before? Yes  [facility]   Have You Recently Had Any Thoughts About Hurting Yourself? Yes  Are You Planning to Commit Suicide/Harm Yourself At This time? Yes   Have you Recently Had Thoughts About Hurting Someone Richard Moon? No  Explanation: No data recorded  Have You Used Any Alcohol or Drugs in the Past 24 Hours? Yes  How Long Ago Did You Use Drugs or Alcohol? No data recorded What Did You Use and How Much? cocaine, marijuana and alchol earlier today (3 beers)   Do You Currently Have a Therapist/Psychiatrist? No  Name of Therapist/Psychiatrist: No data recorded  Have You Been Recently Discharged From Any Office Practice or Programs? No  Explanation of Discharge From Practice/Program: No data recorded    CCA Screening Triage Referral Assessment Type of Contact: Tele-Assessment  Is this Initial or Reassessment? Initial Assessment  Date Telepsych consult ordered in [facility]:  04/25/20  Time Telepsych consult ordered in [facility]:  No data recorded  Patient Reported Information Reviewed? Yes  Patient Left Without Being Seen? No data recorded Reason for Not Completing Assessment: No data recorded  Collateral Involvement: No data recorded  Does Patient Have a Court Appointed Legal Guardian? No data recorded Name and Contact of Legal Guardian: No data recorded If Minor and Not  Living with Parent(s), Who has Custody? No data recorded Is CPS involved or ever been involved? Never  Is APS involved or ever been involved? Never   Patient Determined To Be At Risk for Harm To Self or Others Based on Review of Patient Reported  Information or Presenting Complaint? No  Method: No data recorded Availability of Means: No data recorded Intent: No data recorded Notification Required: No data recorded Additional Information for Danger to Others Potential: No data recorded Additional Comments for Danger to Others Potential: No data recorded Are There Guns or Other Weapons in Your Home? No data recorded Types of Guns/Weapons: No data recorded Are These Weapons Safely Secured?                            No data recorded Who Could Verify You Are Able To Have These Secured: No data recorded Do You Have any Outstanding Charges, Pending Court Dates, Parole/Probation? No data recorded Contacted To Inform of Risk of Harm To Self or Others: No data recorded  Location of Assessment: [facility] ED   Does Patient Present under Involuntary Commitment? No  IVC Papers Initial File Date: No data recorded  [place] of Residence: [place]   Patient Currently Receiving the Following Services: Not Receiving Services   Determination of Need: Urgent (48 hours)   Options For Referral: Other: Comment   CCA Biopsychosocial  Intake/Chief Complaint:  Per EDP/PA note: a 59 y.o. male with a history of major depressive disorder, alcohol use disorder, cocaine use disorder, and marijuana use who presents the emergency department with a chief complaint of suicidal ideation. The patient reports that he has been increasingly suicidal over the last 3 to 4 days. However, symptoms worsened earlier today after he was fired from his job where he has been working for the last year. He plans to shoot himself with a gun. Although he does not have a gun at his home, he states that he has friends who have access to firearms. He also has been feeling homicidal towards his boss who fired him earlier today. He denies AVH. He denies chest pain, shortness of breath, abdominal pain, nausea, vomiting, or diarrhea.  He has been having intermittent headaches, but states  that this is chronic. No treatment for his headache prior to arrival. No numbness no weakness, visual changes, dizziness, lightheadedness. He drinks approximately 6-7 beers daily. He endorses cocaine use but has not had any use for the last 24 hours."   Patient Reported Schizophrenia/Schizoaffective Diagnosis in Past: No   Mental Health Symptoms Depression:  Hopelessness;Irritability;Sleep (too much or little);Worthlessness   Duration of Depressive symptoms: Greater than two weeks   Mania:  None   Anxiety:   Worrying;Restlessness   Psychosis:  Hallucinations   Duration of Psychotic symptoms: Less than six months   Trauma:  None   Obsessions:  None   Compulsions:  None   Inattention:  None   Hyperactivity/Impulsivity:  N/A   Oppositional/Defiant Behaviors:  None   Emotional Irregularity:  None   Other Mood/Personality Symptoms:  No data recorded   Mental Status Exam Appearance and self-care  Stature:  Tall   Weight:  Thin   Clothing:  Casual   Grooming:  Normal   Cosmetic use:  None   Posture/gait:  Normal   Motor activity:  Not Remarkable   Sensorium  Attention:  Normal   Concentration:  Normal   Orientation:  X5   Recall/memory:  Normal  Affect and Mood  Affect:  Depressed   Mood:  Depressed   Relating  Eye contact:  Normal   Facial expression:  Depressed   Attitude toward examiner:  Guarded;Cooperative   Thought and Language  Speech flow: Normal   Thought content:  Appropriate to Mood and Circumstances   Preoccupation:  Suicide   Hallucinations:  None   Organization:  No data recorded  Affiliated Computer ServicesExecutive Functions  Fund of Knowledge:  Average   Intelligence:  Average   Abstraction:  Normal   Judgement:  Tour managerGood;Poor   Reality Testing:  Adequate   Insight:  Poor   Decision Making:  Normal   Social Functioning  Social Maturity:  Isolates (Per chart.)   Social Judgement:  Normal   Stress  Stressors:  Other (Comment) (Pt  reported, he was fired from his job today and having intelligence insulted.)   Coping Ability:  Contractorxhausted   Skill Deficits:  None   Supports:  Support needed      Religion: Religion/Spirituality Are You A Religious Person?: No  Leisure/Recreation: Leisure / Recreation Do You Have Hobbies?: No  Exercise/Diet: Exercise/Diet Do You Exercise?: No Have You Gained or Lost A Significant Amount of Weight in the Past Six Months?: No Do You Follow a Special Diet?: No Do You Have Any Trouble Sleeping?: Yes Explanation of Sleeping Difficulties:  (anxiety and stress)   CCA Employment/Education  Employment/Work Situation: Employment / Work Situation Employment situation: Unemployed (Pt reported, he was fired yesterday.) Where is patient currently employed?: NA How long has patient been employed?: NA What is the longest time patient has a held a job?: UTA Where was the patient employed at that time?: UTA Has patient ever been in the [employer] and Compan[employer]ilitary?: Yes (Describe in comment) (Pt reported, he was in the reserves.)  Education: Education Is Patient Currently Attending School?: No Last Grade Completed: 12 Did Garment/textile technologistYou Graduate From [employer]?: Yes   CCA Family/Childhood History  Family and Relationship History: Family history Marital status: Single (Per chart.) What is your sexual orientation?: UTA Has your sexual activity been affected by drugs, alcohol, medication, or emotional stress?: UTA Does patient have children?: Yes How many children?: 1  Childhood History:  Childhood History Additional childhood history information: UTA Description of patient's relationship with caregiver when they were a child: UTA Patient's description of current relationship with people who raised him/her: UTA How were you disciplined when you got in trouble as a child/adolescent?: UTA Does patient have siblings?: Yes Number of Siblings: 4 Description of patient's current relationship with siblings:  UTA Did patient suffer any verbal/emotional/physical/sexual abuse as a child?: No (Pt denies.) Did patient suffer from severe childhood neglect?: No (Pt denies.) Has patient ever been sexually abused/assaulted/raped as an adolescent or adult?: No (Pt denies.) Was the patient ever a victim of a crime or a disaster?: No (Pt denies.) Witnessed domestic violence?: No (Pt denies.)  Child/Adolescent Assessment:     CCA Substance Use  Alcohol/Drug Use: Alcohol / Drug Use Pain Medications: See MAR Prescriptions: See MAR Over the Counter: See MAR History of alcohol / drug use?: Yes Substance #1 Name of Substance 1: Cocaine. 1 - Age of First Use: UTA 1 - Amount (size/oz): Pt denies, use within 24 hours however pt's UDS is positive for cocaine. 1 - Frequency: UTA 1 - Duration: UTA 1 - Last Use / Amount: UTA     ASAM's:  Six Dimensions of Multidimensional Assessment  Dimension 1:  Acute Intoxication and/or Withdrawal Potential:  Dimension 2:  Biomedical Conditions and Complications:      Dimension 3:  Emotional, Behavioral, or Cognitive Conditions and Complications:     Dimension 4:  Readiness to Change:     Dimension 5:  Relapse, Continued use, or Continued Problem Potential:     Dimension 6:  Recovery/Living Environment:     ASAM Severity Score:    ASAM Recommended Level of Treatment:     Substance use Disorder (SUD)    Recommendations for Services/Supports/Treatments: Recommendations for Services/Supports/Treatments Recommendations For Services/Supports/Treatments: Other (Comment) ([facility].)  DSM5 Diagnoses: Patient Active Problem List   Diagnosis Date Noted  . Cocaine dependence with cocaine-induced mood disorder (HCC)   . MDD (major depressive disorder), severe (HCC) 03/12/2018      Referrals to Alternative Service(s): Referred to Alternative Service(s):   Place:   Date:   Time:    Referred to Alternative Service(s):   Place:    Date:   Time:    Referred to Alternative Service(s):   Place:   Date:   Time:    Referred to Alternative Service(s):   Place:   Date:   Time:     Redmond Pulling, [facility]  Comprehensive Clinical Assessment (CCA) Screening, Triage and Referral Note  04/27/2020 Richard Moon [id number]  Chief Complaint:  Chief Complaint  Patient presents with  . Suicidal   Visit Diagnosis:   Patient Reported Information How did you hear about [place]? Self   Referral name: No data recorded  Referral phone number: No data recorded Whom do you see for routine medical problems? I don't have a doctor   Practice/Facility Name: No data recorded  Practice/Facility Phone Number: No data recorded  Name of Contact: No data recorded  Contact Number: No data recorded  Contact Fax Number: No data recorded  Prescriber Name: No data recorded  Prescriber Address (if known): No data recorded What Is the Reason for Your Visit/Call Today? No data recorded How Long Has This Been Causing You Problems? 1 wk - 1 month  Have You Recently Been in Any Inpatient Treatment (Hospital/Detox/Crisis Center/28-Day Program)? No   Name/Location of Program/Hospital:No data recorded  How Long Were You There? No data recorded  When Were You Discharged? No data recorded Have You Ever Received Services From [facility] Before? Yes   [facility]  Have You Recently Had Any Thoughts About Hurting Yourself? Yes   Are You Planning to Commit Suicide/Harm Yourself At This time?  Yes  Have you Recently Had Thoughts About Hurting Someone Richard Moon? No   Explanation: No data recorded Have You Used Any Alcohol or Drugs in the Past 24 Hours? Yes   How Long Ago Did You Use Drugs or Alcohol?  No data recorded  What Did You Use and How Much? cocaine, marijuana and alchol earlier today (3 beers)  What Do You Feel Would Help You the Most Today? Therapy;Medication  Do You Currently Have a  Therapist/Psychiatrist? No   Name of Therapist/Psychiatrist: No data recorded  Have You Been Recently Discharged From Any Office Practice or Programs? No   Explanation of Discharge From Practice/Program:  No data recorded    CCA Screening Triage Referral Assessment Type of Contact: Tele-Assessment   Is this Initial or Reassessment? Initial Assessment   Date Telepsych consult ordered in [facility]:  04/25/20   Time Telepsych consult ordered in [facility]:  No data recorded Patient Reported Information Reviewed? Yes   Patient  Left Without Being Seen? No data recorded  Reason for Not Completing Assessment: No data recorded Collateral Involvement: No data recorded Does Patient Have a Court Appointed Legal Guardian? No data recorded  Name and Contact of Legal Guardian:  No data recorded If Minor and Not Living with Parent(s), Who has Custody? No data recorded Is CPS involved or ever been involved? Never  Is APS involved or ever been involved? Never  Patient Determined To Be At Risk for Harm To Self or Others Based on Review of Patient Reported Information or Presenting Complaint? No   Method: No data recorded  Availability of Means: No data recorded  Intent: No data recorded  Notification Required: No data recorded  Additional Information for Danger to Others Potential:  No data recorded  Additional Comments for Danger to Others Potential:  No data recorded  Are There Guns or Other Weapons in Your Home?  No data recorded   Types of Guns/Weapons: No data recorded   Are These Weapons Safely Secured?                              No data recorded   Who Could Verify You Are Able To Have These Secured:    No data recorded Do You Have any Outstanding Charges, Pending Court Dates, Parole/Probation? No data recorded Contacted To Inform of Risk of Harm To Self or Others: No data recorded Location of Assessment: [facility] ED  Does Patient Present under Involuntary Commitment? No   IVC Papers Initial File  Date: No data recorded  [place] of Residence: [place]  Patient Currently Receiving the Following Services: Not Receiving Services   Determination of Need: Urgent (48 hours)   Options For Referral: Other: Comment   Redmond Pulling, [facility]     Redmond Pulling, MS, [facility], [facility] Triage Specialist [phone]

## 2020-04-27 NOTE — ED Notes (Signed)
Breakfast Ordered 

## 2020-04-27 NOTE — ED Triage Notes (Signed)
Presents with complaint of suicidal and homicidal ideations.

## 2020-04-27 NOTE — ED Provider Notes (Signed)
FBC/OBS ASAP Discharge Summary  Date and Time: 04/27/2020 10:50 AM  Name: Richard Moon  MRN:  [id number]   Discharge Diagnoses:  Final diagnoses:  Cocaine dependence with cocaine-induced mood disorder (HCC)    Subjective: Patient states "I lost my job and I have been stressed out."  Patient reports current stressor includes recent loss of employment as well as alcohol and cocaine use.  Patient reports he is also homeless "for a while now."  Patient reports he has recently been speaking with a representative from [facility] in [facility] and is "on the list" for admission.  Patient request assistance with transportation.  Patient homeless in [place].  Patient denies access to weapons.  Patient is currently unemployed.  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  Patient attributes this job loss to substance use.  Patient endorses alcohol use.  Patient endorses substance use including cocaine and marijuana daily.  Patient reports readiness to discontinue substance use.  Patient assessed by nurse practitioner.  Patient alert and oriented, answers appropriately.  Patient pleasant cooperative during assessment.  Patient denies suicidal ideation and homicidal ideation.  Patient denies auditory and visual hallucination.  There is no evidence of delusional thought content and no indication patient is responding to internal stimuli.  Patient denies symptoms of paranoia.  Patient endorsed  Patient reports he has been diagnosed with bipolar disorder in the past.  Patient reports he is not currently followed by outpatient psychiatrist and has not taken medications for many years.  Patient reports plan to follow-up with outpatient psychiatry once he completes substance use treatment.  Patient offered support and encouragement.  Patient denies any person to contact for collateral information at this time.  Stay Summary:  Per TTS assessment: Richard Moon 59 year  old man who presents voluntary and unaccompanied to [facility]. Per chart, pt was assessed at [facility] on 04/25/2020 and was discharged the next day  (04/26/2020) and to follow up OPT resources that were provided. Clinician asked the pt, "what brought you to the hospital?" Pt reported, he wanted to shoot himself and his boss for firing him. Pt reported, he does not have a gun but he does have access to a gun. Pt denied, AVH, self-injurious behaviors.    Pt denies, use within 24 hours however pt's UDS is positive for cocaine. Pt denies, being linked to OPT resources (medication management and/or counseling.)       Total Time spent with patient: 20 minutes  Past Psychiatric History: Cocaine dependence with cocaine induced mood disorder, major depressive disorder Past Medical History: History reviewed. No pertinent past medical history.  Past Surgical History:  Procedure Laterality Date  . HERNIA REPAIR     Family History: History reviewed. No pertinent family history. 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: Father-alcohol use disorder Social History:  Social History   Substance and Sexual Activity  Alcohol Use Yes   Comment: Occasionally      Social History   Substance and Sexual Activity  Drug Use Yes  . Types: Marijuana, Cocaine, Heroin    Social History   Socioeconomic History  . Marital status: Single    Spouse name: Not on file  . Number of children: Not on file  . Years of education: Not on file  . Highest education level: Not on file  Occupational History  . Not on file  Tobacco Use  . Smoking status: Current Some Day Smoker    Packs/day: 0.25    Years: 20.00  Pack years: 5.00    Types: Cigarettes  . Smokeless tobacco: Never Used  Vaping Use  . Vaping Use: Every day  Substance and Sexual Activity  . Alcohol use: Yes    Comment: Occasionally   . Drug use: Yes    Types: Marijuana, Cocaine, Heroin  . Sexual activity: Yes    Birth control/protection: Condom  Other Topics  Concern  . Not on file  Social History Narrative  . Not on file   Social Determinants of Health   Financial Resource Strain:   . Difficulty of Paying Living Expenses: Not on file  Food Insecurity:   . Worried About Programme researcher, broadcasting/film/video in the Last Year: Not on file  . Ran Out of Food in the Last Year: Not on file  Transportation Needs:   . Lack of Transportation (Medical): Not on file  . Lack of Transportation (Non-Medical): Not on file  Physical Activity:   . Days of Exercise per Week: Not on file  . Minutes of Exercise per Session: Not on file  Stress:   . Feeling of Stress : Not on file  Social Connections:   . Frequency of Communication with Friends and Family: Not on file  . Frequency of Social Gatherings with Friends and Family: Not on file  . Attends Religious Services: Not on file  . Active Member of Clubs or Organizations: Not on file  . Attends Banker Meetings: Not on file  . Marital Status: Not on file   SDOH:  SDOH Screenings   Alcohol Screen:   . Last Alcohol Screening Score (AUDIT): Not on file  Depression (PHQ2-9):   . PHQ-2 Score: Not on file  Financial Resource Strain:   . Difficulty of Paying Living Expenses: Not on file  Food Insecurity:   . Worried About Programme researcher, broadcasting/film/video in the Last Year: Not on file  . Ran Out of Food in the Last Year: Not on file  Housing:   . Last Housing Risk Score: Not on file  Physical Activity:   . Days of Exercise per Week: Not on file  . Minutes of Exercise per Session: Not on file  Social Connections:   . Frequency of Communication with Friends and Family: Not on file  . Frequency of Social Gatherings with Friends and Family: Not on file  . Attends Religious Services: Not on file  . Active Member of Clubs or Organizations: Not on file  . Attends Banker Meetings: Not on file  . Marital Status: Not on file  Stress:   . Feeling of Stress : Not on file  Tobacco Use: High Risk  . Smoking  Tobacco Use: Current Some Day Smoker  . Smokeless Tobacco Use: Never Used  Transportation Needs:   . Freight forwarder (Medical): Not on file  . Lack of Transportation (Non-Medical): Not on file    Has this patient used any form of tobacco in the last 30 days? (Cigarettes, Smokeless Tobacco, Cigars, and/or Pipes) A prescription for an FDA-approved tobacco cessation medication was offered at discharge and the patient refused  Current Medications:  No current facility-administered medications for this encounter.   No current outpatient medications on file.    PTA Medications: (Not in a hospital admission)   Musculoskeletal  Strength & Muscle Tone: within normal limits Gait & Station: normal Patient leans: N/A  Psychiatric Specialty Exam  Presentation  General Appearance: Appropriate for Environment;Casual  Eye Contact:Good  Speech:Clear and Coherent;Normal Rate  Speech Volume:Normal  Handedness:Right   Mood and Affect  Mood:Euthymic  Affect:Appropriate;Congruent   Thought Process  Thought Processes:Coherent;Goal Directed  Descriptions of Associations:Intact  Orientation:Full (Time, Place and Person)  Thought Content:Logical;WDL  Hallucinations:Hallucinations: None  Ideas of Reference:None  Suicidal Thoughts:Suicidal Thoughts: No SI Active Intent and/or Plan: With Intent;With Plan;Without Means to Carry Out SI Passive Intent and/or Plan: Without Intent;With Plan  Homicidal Thoughts:Homicidal Thoughts: No HI Active Intent and/or Plan: With Intent;With Plan   Sensorium  Memory:Immediate Good;Recent Good;Remote Good  Judgment:Fair  Insight:Good   Executive Functions  Concentration:Good  Attention Span:Good  Recall:Good  Fund of Knowledge:Good  Language:Good   Psychomotor Activity  Psychomotor Activity:Psychomotor Activity: Normal   Assets  Assets:Communication Skills;Desire for Improvement;Physical Health;Resilience;Social  Support   Sleep  Sleep:Sleep: Good Number of Hours of Sleep: 4   Physical Exam  Physical Exam ROS Blood pressure 100/72, pulse (!) 59, temperature 97.7 F (36.5 C), temperature source Tympanic, resp. rate 16, SpO2 100 %. There is no height or weight on file to calculate BMI.  Demographic Factors:  Male and Unemployed  Loss Factors: Decrease in vocational status  Historical Factors: NA  Risk Reduction Factors:   Positive social support, Positive therapeutic relationship and Positive coping skills or problem solving skills  Continued Clinical Symptoms:  Alcohol/Substance Abuse/Dependencies  Cognitive Features That Contribute To Risk:  None    Suicide Risk:  Minimal: No identifiable suicidal ideation.  Patients presenting with no risk factors but with morbid ruminations; may be classified as minimal risk based on the severity of the depressive symptoms  Plan Of Care/Follow-up recommendations:  Patient reviewed with Dr. Jannifer Franklin. Other:  Follow-up with substance use treatment resources and outpatient psychiatry.  Disposition: Discharge  Patrcia Dolly, FNP 04/27/2020, 10:50 AM

## 2020-04-27 NOTE — ED Notes (Signed)
Pt educated about avs. Verbalized understanding. Escorted to retrieve belongings. Ambulated per self. Escorted to front lobby and out front door. No new issues noted. Given 2 bus passes to get to where pt wanted to go. Stable at time of d/c.

## 2020-04-27 NOTE — ED Provider Notes (Signed)
[facility] EMERGENCY DEPARTMENT Provider Note   CSN: [id number] Arrival date & time: 04/26/20  2147     History Chief Complaint  Patient presents with  . Suicidal    Richard Moon is a 59 y.o. male with a history of major depressive disorder, alcohol use disorder, cocaine use disorder, and marijuana use who presents the emergency department with a chief complaint of suicidal ideation.  The patient reports that he has been increasingly suicidal over the last 3 to 4 days.  However, symptoms worsened earlier today after he was fired from his job where he has been working for the last year.  He plan to shoot himself with a gun.  Although he does not have a gun at his home, he states that he has friends who have access to firearms.  He also has been feeling homicidal towards his boss who fired him earlier today.  He denies AVH.   He denies chest pain, shortness of breath, abdominal pain, nausea, vomiting, or diarrhea.  He has been having intermittent headaches, but states that this is chronic.  No treatment for his headache prior to arrival.  No numbness no weakness, visual changes, dizziness, lightheadedness.  He drinks approximately 6-7 beers daily.  He endorses cocaine use, but has not had any use for the last 24 hours.  He denies other illicit or recreational substance use.  The history is provided by the patient and medical records. No language interpreter was used.       History reviewed. No pertinent past medical history.  Patient Active Problem List   Diagnosis Date Noted  . Cocaine dependence with cocaine-induced mood disorder (HCC)   . MDD (major depressive disorder), severe (HCC) 03/12/2018    Past Surgical History:  Procedure Laterality Date  . HERNIA REPAIR         No family history on file.  Social History   Tobacco Use  . Smoking status: Current Some Day Smoker    Packs/day: 0.25    Years: 20.00    Pack years: 5.00    Types: Cigarettes   . Smokeless tobacco: Never Used  Vaping Use  . Vaping Use: Every day  Substance Use Topics  . Alcohol use: Yes    Comment: Occasionally   . Drug use: Yes    Types: Marijuana, Cocaine, Heroin    Home Medications Prior to Admission medications   Not on File    Allergies    Patient has no known allergies.  Review of Systems   Review of Systems  Constitutional: Negative for appetite change and fever.  HENT: Negative for sore throat.   Eyes: Negative for visual disturbance.  Respiratory: Negative for shortness of breath and wheezing.   Cardiovascular: Negative for chest pain and palpitations.  Gastrointestinal: Negative for abdominal pain, blood in stool, diarrhea, nausea and vomiting.  Genitourinary: Negative for dysuria and urgency.  Musculoskeletal: Negative for back pain, myalgias, neck pain and neck stiffness.  Skin: Negative for rash and wound.  Allergic/Immunologic: Negative for immunocompromised state.  Neurological: Positive for headaches. Negative for dizziness, seizures, syncope, weakness and numbness.  Psychiatric/Behavioral: Positive for suicidal ideas. Negative for confusion and hallucinations.    Physical Exam Updated Vital Signs BP (!) 94/58 (BP Location: Left Arm)   Pulse (!) 52   Temp 98.3 F (36.8 C) (Oral)   Resp 18   Ht 5\' 10"  (1.778 m)   Wt 72.6 kg   SpO2 98%   BMI 22.96 kg/m  Physical Exam Vitals and nursing note reviewed.  Constitutional:      General: He is not in acute distress.    Appearance: He is well-developed. He is not ill-appearing, toxic-appearing or diaphoretic.  HENT:     Head: Normocephalic.     Nose: Nose normal. No congestion or rhinorrhea.  Eyes:     General: No scleral icterus.    Conjunctiva/sclera: Conjunctivae normal.  Cardiovascular:     Rate and Rhythm: Normal rate and regular rhythm.     Pulses: Normal pulses.     Heart sounds: Normal heart sounds. No murmur heard.  No friction rub. No gallop.   Pulmonary:      Effort: Pulmonary effort is normal. No respiratory distress.     Breath sounds: No stridor. No wheezing, rhonchi or rales.  Chest:     Chest wall: No tenderness.  Abdominal:     General: There is no distension.     Palpations: Abdomen is soft. There is no mass.     Tenderness: There is no abdominal tenderness. There is no right CVA tenderness, left CVA tenderness, guarding or rebound.     Hernia: No hernia is present.  Musculoskeletal:     Cervical back: Neck supple.     Right lower leg: No edema.     Left lower leg: No edema.  Skin:    General: Skin is warm and dry.     Coloration: Skin is not jaundiced.  Neurological:     Mental Status: He is alert.  Psychiatric:        Attention and Perception: He does not perceive auditory or visual hallucinations.        Mood and Affect: Affect is flat.        Speech: Speech normal.        Behavior: Behavior normal.        Thought Content: Thought content includes homicidal and suicidal ideation. Thought content includes homicidal and suicidal plan.     ED Results / Procedures / Treatments   Labs (all labs ordered are listed, but only abnormal results are displayed) Labs Reviewed  COMPREHENSIVE METABOLIC PANEL - Abnormal; Notable for the following components:      Result Value   Glucose, Bld 122 (*)    Creatinine, Ser 1.42 (*)    GFR, Estimated 58 (*)    All other components within normal limits  SALICYLATE LEVEL - Abnormal; Notable for the following components:   Salicylate Lvl <7.0 (*)    All other components within normal limits  ACETAMINOPHEN LEVEL - Abnormal; Notable for the following components:   Acetaminophen (Tylenol), Serum <10 (*)    All other components within normal limits  CBC - Abnormal; Notable for the following components:   MCV 100.8 (*)    All other components within normal limits  RAPID URINE DRUG SCREEN, HOSP PERFORMED - Abnormal; Notable for the following components:   Cocaine POSITIVE (*)    All other  components within normal limits  RESPIRATORY PANEL BY RT PCR (FLU A&B, COVID)  ETHANOL    EKG None  Radiology No results found.  Procedures Procedures (including critical care time)  Medications Ordered in ED Medications  acetaminophen (TYLENOL) tablet 650 mg (650 mg Oral Given 04/27/20 0236)    ED Course  I have reviewed the triage vital signs and the nursing notes.  Pertinent labs & imaging results that were available during my care of the patient were reviewed by me and considered in my medical  decision making (see chart for details).    MDM Rules/Calculators/A&P                          59 year old male with a history of cocaine use disorder, alcohol use disorder, depression, and marijuana use presenting with worsening suicidal ideation over the last few days and homicidal ideation that began after he was fired from his job earlier today.  Last cocaine use was more than 24 hours ago.  He is having no other associated symptoms.  Vital signs are reassuring.  UDS is positive for cocaine.  Ethanol level is not elevated.  Creatinine is stable at 1.4.  No metabolic derangements.  Pt medically cleared at this time. Psych hold orders and home med orders placed. TTS consult pending; please see psych team notes for further documentation of care/dispo. Pt stable at time of med clearance.    I spoke with [facility] provider, Gillermo Murdoch, who accepts patient to [facility].  EMTALA completed by Dr. Eudelia Bunch, attending physician.   Final Clinical Impression(s) / ED Diagnoses Final diagnoses:  Suicidal ideation  Homicidal ideation    Rx / DC Orders ED Discharge Orders    None       Barkley Boards, PA-C 04/27/20 0507    Nira Conn, MD 04/27/20 [phone]

## 2020-04-27 NOTE — ED Notes (Signed)
PResents with suicidal ideations, plan to shoot self with gun and homicidal towards his boss he reports.  Denies AVH.  Skin search completed, no distress noted, pt calm & cooperative.  Monitoring for safety.

## 2020-04-27 NOTE — Discharge Instructions (Addendum)
Take all of your medications as prescribed by your mental healthcare provider.  Report any adverse effects and reactions from your medications to your outpatient provider promptly.  Do not engage in alcohol and or illegal drug use while on prescription medicines. Keep all scheduled appointments. This is to ensure that you are getting refills on time and to avoid any interruption in your medication.  If you are unable to keep an appointment call to reschedule.  Be sure to follow up with resources and follow ups given. In the event of worsening symptoms call the crisis hotline, 911, and or go to the nearest emergency department for appropriate evaluation and treatment of symptoms. Follow-up with your primary care provider for your medical issues, concerns and or health care needs.    Homeless Shelter List:     Health and safety inspector [facility])  [address] [place], [place]  Phone: [phone]     [facility]  [address] [place] [address] [place], [place] [address]  Phone: [phone]     [facility] (Women only)  [address] [place], [place] [address]  Phone: [phone]     [facility]  [address], [place] [address]  Phone: [phone]     [facility]:  [phone]. [address]  [place], [place] [address]  Phone: [phone]     [facility]  [address] [place], [place] [address]  (Check in at 6:00PM for placement at a local shelter)  Phone: [phone]   Substance abuse resources and Residential Options:  [facility] day residential substance abuse facility (not an option if you have active assault charges). [address], [place], [place] [address] Phone: [phone]: Ask for Drema Pry in admissions to complete intake if interested in pursuing this option.  [facility]: Can get intake scheduled; (not an option if you  have active assault charges). [address] [place], [place] [phone]) Call Mon-Fri.  [facility]): (offers outpatient therapy and intensive outpatient substance abuse therapy).  [address], [place], [place] [address] Phone: [phone]  [facility]: Offers FREE recovery skills classes, support groups, 1:1 Peer Support, and Compeer Classes. [address] [place], [place] [address] Phone: [phone] (Call to complete intake).   [facility] [address] [place], [place] [address] Phone: [phone] ext [phone]  [facility] provides food, shelter and other programs and services to the homeless men of [place] through our [employer] program.  By offering safe shelter, three meals a day, clean clothing, Biblical counseling, financial planning, vocational training, GED/education and employment assistance, we've helped mend the shattered lives of many homeless men since opening in 1974.  We have approximately 267 beds available, with a max of 312 beds including mats for emergency situations and currently house an average of 270 men a night.  Prospective Client Check-In Information Photo ID Required (State/ Out of State/ [facility]) - if photo ID is not available, clients are required to have a printout of a police/sheriff's criminal history report. Help out with chores around the Mission. No sex offender of any type (pending, charged, registered and/or any other sex related offenses) will be permitted to check in. Must be willing to abide by all rules, regulations, and policies established by the [employer]. The following will be provided - shelter, food, clothing, and biblical counseling. If you or someone you  know is in need of assistance at our [facility] shelter in [place], [place], please call [phone] ext. [phone].

## 2020-05-20 ENCOUNTER — Other Ambulatory Visit: Payer: Self-pay

## 2020-05-20 ENCOUNTER — Emergency Department (HOSPITAL_COMMUNITY)
Admission: EM | Admit: 2020-05-20 | Discharge: 2020-05-20 | Disposition: A | Discharge status: Home / Self Care | Payer: Self-pay | Attending: Emergency Medicine | Admitting: Emergency Medicine

## 2020-05-20 ENCOUNTER — Emergency Department (HOSPITAL_COMMUNITY): Payer: Self-pay

## 2020-05-20 ENCOUNTER — Encounter (HOSPITAL_COMMUNITY): Payer: Self-pay | Admitting: Emergency Medicine

## 2020-05-20 DIAGNOSIS — Z5321 Procedure and treatment not carried out due to patient leaving prior to being seen by health care provider: Secondary | ICD-10-CM | POA: Insufficient documentation

## 2020-05-20 DIAGNOSIS — R519 Headache, unspecified: Secondary | ICD-10-CM | POA: Insufficient documentation

## 2020-05-20 DIAGNOSIS — R002 Palpitations: Secondary | ICD-10-CM | POA: Insufficient documentation

## 2020-05-20 DIAGNOSIS — R0602 Shortness of breath: Secondary | ICD-10-CM | POA: Insufficient documentation

## 2020-05-20 LAB — COMPREHENSIVE METABOLIC PANEL
ALT: 19 U/L (ref 0–44)
AST: 32 U/L (ref 15–41)
Albumin: 4 g/dL (ref 3.5–5.0)
Alkaline Phosphatase: 47 U/L (ref 38–126)
Anion gap: 11 (ref 5–15)
BUN: 18 mg/dL (ref 6–20)
CO2: 25 mmol/L (ref 22–32)
Calcium: 9.7 mg/dL (ref 8.9–10.3)
Chloride: 105 mmol/L (ref 98–111)
Creatinine, Ser: 1.39 mg/dL — ABNORMAL HIGH (ref 0.61–1.24)
GFR, Estimated: 59 mL/min — ABNORMAL LOW (ref >60)
Glucose, Bld: 86 mg/dL (ref 70–99)
Potassium: 3.5 mmol/L (ref 3.5–5.1)
Sodium: 141 mmol/L (ref 135–145)
Total Bilirubin: 1 mg/dL (ref 0.3–1.2)
Total Protein: 6.8 g/dL (ref 6.5–8.1)

## 2020-05-20 LAB — CBC WITH DIFFERENTIAL/PLATELET
Abs Immature Granulocytes: 0.01 10*3/uL (ref 0.00–0.07)
Basophils Absolute: 0 10*3/uL (ref 0.0–0.1)
Basophils Relative: 0 %
Eosinophils Absolute: 0.1 10*3/uL (ref 0.0–0.5)
Eosinophils Relative: 1 %
HCT: 43.1 % (ref 39.0–52.0)
Hemoglobin: 14 g/dL (ref 13.0–17.0)
Immature Granulocytes: 0 %
Lymphocytes Relative: 26 %
Lymphs Abs: 1.5 10*3/uL (ref 0.7–4.0)
MCH: 32.1 pg (ref 26.0–34.0)
MCHC: 32.5 g/dL (ref 30.0–36.0)
MCV: 98.9 fL (ref 80.0–100.0)
Monocytes Absolute: 0.6 10*3/uL (ref 0.1–1.0)
Monocytes Relative: 10 %
Neutro Abs: 3.6 10*3/uL (ref 1.7–7.7)
Neutrophils Relative %: 63 %
Platelets: 243 10*3/uL (ref 150–400)
RBC: 4.36 MIL/uL (ref 4.22–5.81)
RDW: 13.2 % (ref 11.5–15.5)
WBC: 5.7 10*3/uL (ref 4.0–10.5)
nRBC: 0 % (ref 0.0–0.2)

## 2020-05-20 NOTE — ED Triage Notes (Signed)
Pt c/o palpitation and SOB since yesterday, with some HA.

## 2020-05-20 NOTE — ED Notes (Signed)
Pt spoke with [facility] and said they felt like they have waited too long and left.

## 2020-05-25 ENCOUNTER — Encounter (HOSPITAL_COMMUNITY): Payer: Self-pay | Admitting: *Deleted

## 2020-05-25 ENCOUNTER — Other Ambulatory Visit: Payer: Self-pay

## 2020-05-25 ENCOUNTER — Emergency Department (HOSPITAL_COMMUNITY)
Admission: EM | Admit: 2020-05-25 | Discharge: 2020-05-25 | Disposition: A | Discharge status: Other Acute Inpatient Hospital | Payer: Self-pay | Attending: Emergency Medicine | Admitting: Emergency Medicine

## 2020-05-25 ENCOUNTER — Ambulatory Visit (HOSPITAL_COMMUNITY)
Admission: EM | Admit: 2020-05-25 | Discharge: 2020-05-26 | Disposition: A | Discharge status: Home / Self Care | Payer: No Payment, Other | Attending: Family | Admitting: Family

## 2020-05-25 DIAGNOSIS — F17210 Nicotine dependence, cigarettes, uncomplicated: Secondary | ICD-10-CM | POA: Insufficient documentation

## 2020-05-25 DIAGNOSIS — F1424 Cocaine dependence with cocaine-induced mood disorder: Secondary | ICD-10-CM

## 2020-05-25 DIAGNOSIS — F1590 Other stimulant use, unspecified, uncomplicated: Secondary | ICD-10-CM | POA: Insufficient documentation

## 2020-05-25 DIAGNOSIS — Z20822 Contact with and (suspected) exposure to covid-19: Secondary | ICD-10-CM | POA: Insufficient documentation

## 2020-05-25 DIAGNOSIS — F1410 Cocaine abuse, uncomplicated: Secondary | ICD-10-CM | POA: Insufficient documentation

## 2020-05-25 DIAGNOSIS — R45851 Suicidal ideations: Secondary | ICD-10-CM | POA: Insufficient documentation

## 2020-05-25 DIAGNOSIS — F1494 Cocaine use, unspecified with cocaine-induced mood disorder: Secondary | ICD-10-CM | POA: Insufficient documentation

## 2020-05-25 DIAGNOSIS — F322 Major depressive disorder, single episode, severe without psychotic features: Secondary | ICD-10-CM

## 2020-05-25 DIAGNOSIS — F332 Major depressive disorder, recurrent severe without psychotic features: Secondary | ICD-10-CM | POA: Insufficient documentation

## 2020-05-25 LAB — COMPREHENSIVE METABOLIC PANEL
ALT: 17 U/L (ref 0–44)
AST: 29 U/L (ref 15–41)
Albumin: 4 g/dL (ref 3.5–5.0)
Alkaline Phosphatase: 43 U/L (ref 38–126)
Anion gap: 12 (ref 5–15)
BUN: 14 mg/dL (ref 6–20)
CO2: 27 mmol/L (ref 22–32)
Calcium: 9.9 mg/dL (ref 8.9–10.3)
Chloride: 104 mmol/L (ref 98–111)
Creatinine, Ser: 1.31 mg/dL — ABNORMAL HIGH (ref 0.61–1.24)
GFR, Estimated: >60 mL/min (ref >60)
Glucose, Bld: 90 mg/dL (ref 70–99)
Potassium: 3.5 mmol/L (ref 3.5–5.1)
Sodium: 143 mmol/L (ref 135–145)
Total Bilirubin: 0.6 mg/dL (ref 0.3–1.2)
Total Protein: 6.7 g/dL (ref 6.5–8.1)

## 2020-05-25 LAB — ACETAMINOPHEN LEVEL: Acetaminophen (Tylenol), Serum: <10 ug/mL — ABNORMAL LOW (ref 10–30)

## 2020-05-25 LAB — CBC
HCT: 43.6 % (ref 39.0–52.0)
Hemoglobin: 14 g/dL (ref 13.0–17.0)
MCH: 32 pg (ref 26.0–34.0)
MCHC: 32.1 g/dL (ref 30.0–36.0)
MCV: 99.8 fL (ref 80.0–100.0)
Platelets: 239 10*3/uL (ref 150–400)
RBC: 4.37 MIL/uL (ref 4.22–5.81)
RDW: 13.4 % (ref 11.5–15.5)
WBC: 6.3 10*3/uL (ref 4.0–10.5)
nRBC: 0 % (ref 0.0–0.2)

## 2020-05-25 LAB — SALICYLATE LEVEL: Salicylate Lvl: <7 mg/dL — ABNORMAL LOW (ref 7.0–30.0)

## 2020-05-25 LAB — RAPID URINE DRUG SCREEN, HOSP PERFORMED
Amphetamines: NOT DETECTED
Barbiturates: NOT DETECTED
Benzodiazepines: NOT DETECTED
Cocaine: POSITIVE — AB
Opiates: NOT DETECTED
Tetrahydrocannabinol: NOT DETECTED

## 2020-05-25 LAB — RESP PANEL BY RT-PCR (FLU A&B, COVID) ARPGX2
Influenza A by PCR: NEGATIVE
Influenza B by PCR: NEGATIVE
SARS Coronavirus 2 by RT PCR: NEGATIVE

## 2020-05-25 LAB — ETHANOL: Alcohol, Ethyl (B): <10 mg/dL (ref <10)

## 2020-05-25 MED ORDER — ALUM & MAG HYDROXIDE-SIMETH 200-200-20 MG/5ML PO SUSP: 30.0000 mL | ORAL | Status: DC | PRN

## 2020-05-25 MED ORDER — TRAZODONE HCL 50 MG PO TABS: 50.0000 mg | ORAL_TABLET | Freq: Every evening | ORAL | Status: DC | PRN

## 2020-05-25 MED ORDER — MAGNESIUM HYDROXIDE 400 MG/5ML PO SUSP: 30.0000 mL | Freq: Every day | ORAL | Status: DC | PRN

## 2020-05-25 MED ORDER — ACETAMINOPHEN 325 MG PO TABS: 650.0000 mg | ORAL_TABLET | Freq: Four times a day (QID) | ORAL | Status: DC | PRN

## 2020-05-25 NOTE — ED Notes (Signed)
Pt given turkey sandwich bag and water per RN orders 

## 2020-05-25 NOTE — ED Notes (Signed)
Pt belongings removed, changed into paper scrubs, wanded by security. Pt given meal/drink, placed in recliner in triage. Blanket provided. Pt is asleep at this time. Respirations even and unlabored.   

## 2020-05-25 NOTE — ED Provider Notes (Signed)
Care assumed from Carma Lair PA-C at shift change pending TTS recommendations.  See her note for full H&P.   Behavioral health is recommending patient be admitted to be kept for observation.  I contacted [facility] provider and report given to provider Garen Lah. Covid test is negative.   Patient stable to be transported. He is agreeable with plan of care.   Portions of this note were generated with Scientist, clinical (histocompatibility and immunogenetics). Dictation errors may occur despite best attempts at proofreading.    Shanon Ace, PA-C 05/25/20 Hal Neer, MD 05/26/20 1352

## 2020-05-25 NOTE — ED Notes (Signed)
Vital signs stable. 

## 2020-05-25 NOTE — ED Notes (Signed)
PATIENT BELONGINGS ARE STORED IN LOCKER 28 

## 2020-05-25 NOTE — ED Notes (Signed)
Transport call pending Covid result.
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
## 2020-05-25 NOTE — ED Triage Notes (Signed)
Pt says he is depressed and would kill himself, would take a knife and harm himself.

## 2020-05-25 NOTE — ED Notes (Signed)
Breakfast Ordered 

## 2020-05-25 NOTE — ED Provider Notes (Signed)
[facility] EMERGENCY DEPARTMENT Provider Note   CSN: [id number] Arrival date & time: 05/25/20  0175     History Chief Complaint  Patient presents with  . Suicidal    Richard Moon is a 59 y.o. male.  HPI 59 year old male with a history of cocaine abuse, MDD presents to the ER with suicidal ideation.  Patient states that this time of year is the anniversary of his mother's death which "sent him to a dark place".  He has had thoughts of cutting his throat with a knife or cutting his arm he denies any homicidal ideations.  Endorses cocaine use, states last use was yesterday and was "a little".  He denies any chest pain, shortness of breath or any other complaints at this time.  He denies any visual hallucinations, states he will hear a weird ringing sound sometimes in his ears.  Denies any dizziness, syncope.    History reviewed. No pertinent past medical history.  Patient Active Problem List   Diagnosis Date Noted  . Cocaine dependence with cocaine-induced mood disorder (HCC)   . MDD (major depressive disorder), severe (HCC) 03/12/2018    Past Surgical History:  Procedure Laterality Date  . HERNIA REPAIR         No family history on file.  Social History   Tobacco Use  . Smoking status: Current Some Day Smoker    Packs/day: 0.25    Years: 20.00    Pack years: 5.00    Types: Cigarettes  . Smokeless tobacco: Never Used  Vaping Use  . Vaping Use: Every day  Substance Use Topics  . Alcohol use: Yes    Comment: Occasionally   . Drug use: Yes    Types: Marijuana, Cocaine, Heroin    Home Medications Prior to Admission medications   Not on File    Allergies    Patient has no known allergies.  Review of Systems   Review of Systems  Constitutional: Negative for chills and fever.  HENT: Negative for ear pain and sore throat.   Eyes: Negative for pain and visual disturbance.  Respiratory: Negative for cough and shortness of breath.     Cardiovascular: Negative for chest pain and palpitations.  Gastrointestinal: Negative for abdominal pain and vomiting.  Genitourinary: Negative for dysuria and hematuria.  Musculoskeletal: Negative for arthralgias and back pain.  Skin: Negative for color change and rash.  Neurological: Negative for seizures and syncope.  Psychiatric/Behavioral: Positive for decreased concentration, self-injury, sleep disturbance and suicidal ideas. Negative for confusion, dysphoric mood and hallucinations. The patient is nervous/anxious. The patient is not hyperactive.   All other systems reviewed and are negative.   Physical Exam Updated Vital Signs BP 101/68 (BP Location: Right Arm)   Pulse 69   Temp 98.7 F (37.1 C) (Oral)   Resp 18   SpO2 (P) 100%   Physical Exam Vitals and nursing note reviewed.  Constitutional:      General: He is not in acute distress.    Appearance: He is well-developed. He is not ill-appearing, toxic-appearing or diaphoretic.  HENT:     Head: Normocephalic and atraumatic.  Eyes:     Conjunctiva/sclera: Conjunctivae normal.  Cardiovascular:     Rate and Rhythm: Normal rate and regular rhythm.     Heart sounds: No murmur heard.   Pulmonary:     Effort: Pulmonary effort is normal. No respiratory distress.     Breath sounds: Normal breath sounds.  Abdominal:     Palpations: Abdomen  is soft.     Tenderness: There is no abdominal tenderness.  Musculoskeletal:        General: Normal range of motion.     Cervical back: Neck supple.  Skin:    General: Skin is warm and dry.  Neurological:     General: No focal deficit present.     Mental Status: He is alert and oriented to person, place, and time.     ED Results / Procedures / Treatments   Labs (all labs ordered are listed, but only abnormal results are displayed) Labs Reviewed  COMPREHENSIVE METABOLIC PANEL - Abnormal; Notable for the following components:      Result Value   Creatinine, Ser 1.31 (*)    All  other components within normal limits  SALICYLATE LEVEL - Abnormal; Notable for the following components:   Salicylate Lvl <7.0 (*)    All other components within normal limits  ACETAMINOPHEN LEVEL - Abnormal; Notable for the following components:   Acetaminophen (Tylenol), Serum <10 (*)    All other components within normal limits  RAPID URINE DRUG SCREEN, HOSP PERFORMED - Abnormal; Notable for the following components:   Cocaine POSITIVE (*)    All other components within normal limits  ETHANOL  CBC    EKG None  Radiology No results found.  Procedures Procedures (including critical care time)  Medications Ordered in ED Medications - No data to display  ED Course  I have reviewed the triage vital signs and the nursing notes.  Pertinent labs & imaging results that were available during my care of the patient were reviewed by me and considered in my medical decision making (see chart for details).    MDM Rules/Calculators/A&P                          Patient presents to the ER with complaints of SI, requiring medical current clearance for evaluation by psychiatry.  On presentation, the patient is calm and cooperative.  Vitals personally reviewed by me, overall reassuring.  I personally reviewed his lab work, which did not show any significant abnormalities.  CBC without leukocytosis, normal hemoglobin.  CMP without electrolyte abnormalities, elevated creatinine but this appears to be at baseline  Negative acetaminophen, salicylate, ethanol.  UDS positive for cocaine. No signs of withdrawal.    He has been medically cleared for further evaluation by TTS.  Dispo according to their  recommendation.  Final Clinical Impression(s) / ED Diagnoses Final diagnoses:  Suicidal ideation    Rx / DC Orders ED Discharge Orders    None       Leone Brand 05/25/20 1216    Milagros Loll, MD 05/27/20 0730

## 2020-05-25 NOTE — ED Notes (Signed)
Patient states he is depressed and having SI thoughts because of anniversary when mom passed a year ago. Patient given support and encouragement. Monitoring continues.

## 2020-05-26 ENCOUNTER — Emergency Department (HOSPITAL_COMMUNITY)
Admission: EM | Admit: 2020-05-26 | Discharge: 2020-05-28 | Disposition: A | Discharge status: Home / Self Care | Payer: Self-pay | Attending: Emergency Medicine | Admitting: Emergency Medicine

## 2020-05-26 ENCOUNTER — Other Ambulatory Visit: Payer: Self-pay

## 2020-05-26 ENCOUNTER — Encounter (HOSPITAL_COMMUNITY): Payer: Self-pay | Admitting: Emergency Medicine

## 2020-05-26 DIAGNOSIS — R45851 Suicidal ideations: Secondary | ICD-10-CM | POA: Insufficient documentation

## 2020-05-26 DIAGNOSIS — F17210 Nicotine dependence, cigarettes, uncomplicated: Secondary | ICD-10-CM | POA: Insufficient documentation

## 2020-05-26 DIAGNOSIS — F1494 Cocaine use, unspecified with cocaine-induced mood disorder: Secondary | ICD-10-CM | POA: Insufficient documentation

## 2020-05-26 LAB — COMPREHENSIVE METABOLIC PANEL
ALT: 16 U/L (ref 0–44)
AST: 27 U/L (ref 15–41)
Albumin: 3.7 g/dL (ref 3.5–5.0)
Alkaline Phosphatase: 57 U/L (ref 38–126)
Anion gap: 9 (ref 5–15)
BUN: 18 mg/dL (ref 6–20)
CO2: 27 mmol/L (ref 22–32)
Calcium: 9.4 mg/dL (ref 8.9–10.3)
Chloride: 105 mmol/L (ref 98–111)
Creatinine, Ser: 1.26 mg/dL — ABNORMAL HIGH (ref 0.61–1.24)
GFR, Estimated: >60 mL/min (ref >60)
Glucose, Bld: 93 mg/dL (ref 70–99)
Potassium: 4.1 mmol/L (ref 3.5–5.1)
Sodium: 141 mmol/L (ref 135–145)
Total Bilirubin: 0.6 mg/dL (ref 0.3–1.2)
Total Protein: 6.4 g/dL — ABNORMAL LOW (ref 6.5–8.1)

## 2020-05-26 LAB — CBC
HCT: 45.5 % (ref 39.0–52.0)
Hemoglobin: 15.2 g/dL (ref 13.0–17.0)
MCH: 33.6 pg (ref 26.0–34.0)
MCHC: 33.4 g/dL (ref 30.0–36.0)
MCV: 100.4 fL — ABNORMAL HIGH (ref 80.0–100.0)
Platelets: 225 10*3/uL (ref 150–400)
RBC: 4.53 MIL/uL (ref 4.22–5.81)
RDW: 13.3 % (ref 11.5–15.5)
WBC: 5.2 10*3/uL (ref 4.0–10.5)
nRBC: 0 % (ref 0.0–0.2)

## 2020-05-26 LAB — RAPID URINE DRUG SCREEN, HOSP PERFORMED
Amphetamines: NOT DETECTED
Barbiturates: NOT DETECTED
Benzodiazepines: NOT DETECTED
Cocaine: POSITIVE — AB
Opiates: NOT DETECTED
Tetrahydrocannabinol: NOT DETECTED

## 2020-05-26 LAB — ACETAMINOPHEN LEVEL: Acetaminophen (Tylenol), Serum: <10 ug/mL — ABNORMAL LOW (ref 10–30)

## 2020-05-26 LAB — SALICYLATE LEVEL: Salicylate Lvl: <7 mg/dL — ABNORMAL LOW (ref 7.0–30.0)

## 2020-05-26 LAB — ETHANOL: Alcohol, Ethyl (B): <10 mg/dL (ref <10)

## 2020-05-26 NOTE — ED Provider Notes (Signed)
Behavioral Health Admission H&P [facility] & OBS)  Date: 05/26/20 Patient Name: Richard Moon MRN: [id number] Chief Complaint:  Chief Complaint  Patient presents with  . Suicidal      Diagnoses: MDD (major depressive disorder), severe (HCC)  HPI: Richard Moon is a 59 year old male with a history of cocaine abuse, MDD presents to the ER with suicidal ideation.  Patient states that this time of year is the anniversary of his mother's death which "sent him to a dark place".  He has had thoughts of cutting his throat with a knife or cutting his arm he denies any homicidal ideations.  Endorses cocaine use, states last use was yesterday and was "a little".  He denies any chest pain, shortness of breath or any other complaints at this time.  He denies any visual hallucinations, states he will hear a weird ringing sound sometimes in his ears.  Denies any dizziness, syncope. During the patient assessment, he is alert and oriented x 4, pleasant, and cooperative. Speech is clear and coherent, normal pace, normal volume.  Mood is euthymic and affect is congruent with mood. Thought process is coherent. Patient denies visual and auditory hallucinations. No indication that patient is responding to internal stimuli. Denies paranoia. No evidence of delusional thought content. Denies suicidal ideations. Denies homicidal ideations.   PHQ 2-9:    ED from 05/25/2020 in [facility] EMERGENCY DEPARTMENT  Thoughts that you would be better off dead, or of hurting yourself in some way Nearly every day  PHQ-9 Total Score 25        ED from 04/24/2020 in [facility] EM[facility]GENCY [facility]PART[facility] Ad[facility] ([facility]) [facility] from 03[facility]11/2018 in [facility] EMERGENCY DEPARTMENT  C-SSRS RISK CATEGORY High Risk High Risk High Risk       Total Time spent with patient: 20 minutes  Musculoskeletal  Strength & Muscle Tone:  within normal limits Gait & Station: normal Patient leans: N/A  Psychiatric Specialty Exam  Presentation General Appearance: Appropriate for Environment;Casual  Eye Contact:Good  Speech:Clear and Coherent;Normal Rate  Speech Volume:Normal  Handedness:Right   Mood and Affect  Mood:Euthymic  Affect:Appropriate;Congruent   Thought Process  Thought Processes:Coherent;Goal Directed  Descriptions of Associations:Intact  Orientation:Full (Time, Place and Person)  Thought Content:Logical;WDL  Hallucinations:No data recorded Ideas of Reference:None  Suicidal Thoughts:No data recorded Homicidal Thoughts:No data recorded  Sensorium  Memory:Immediate Good;Recent Good;Remote Good  Judgment:Fair  Insight:Good   Executive Functions  Concentration:Good  Attention Span:Good  Recall:Good  Fund of Knowledge:Good  Language:Good   Psychomotor Activity  Psychomotor Activity:No data recorded  Assets  Assets:Communication Skills;Desire for Improvement;Physical Health;Resilience;Social Support   Sleep  Sleep:No data recorded  Physical Exam Vitals and nursing note reviewed.  Constitutional:      Appearance: Normal appearance.  HENT:     Right Ear: External ear normal.     Left Ear: External ear normal.     Nose: Nose normal.     Mouth/Throat:     Mouth: Mucous membranes are moist.  Cardiovascular:     Rate and Rhythm: Bradycardia present.  Pulmonary:     Effort: Pulmonary effort is normal.  Musculoskeletal:        General: Normal range of motion.     Cervical back: Normal range of motion and neck supple.  Neurological:     General: No focal deficit present.     Mental Status: He is alert and oriented to person, place, and  time. Mental status is at baseline.  Psychiatric:        Attention and Perception: Attention and perception normal.        Mood and Affect: Mood and affect normal.        Speech: Speech normal.        Behavior: Behavior normal.  Behavior is cooperative.        Thought Content: Thought content includes suicidal ideation. Thought content includes suicidal plan.        Cognition and Memory: Cognition and memory normal.        Judgment: Judgment normal.    Review of Systems  Psychiatric/Behavioral: Positive for suicidal ideas. The patient is nervous/anxious.   All other systems reviewed and are negative.   Blood pressure 113/84, pulse (!) 56, temperature 98.6 F (37 C), temperature source Temporal, resp. rate 18, height 5\' 11"  (1.803 m), weight 68 kg, SpO2 90 %. Body mass index is 20.92 kg/m.  Past Psychiatric History:    Is the patient at risk to self? No  Has the patient been a risk to self in the past 6 months? No .    Has the patient been a risk to self within the distant past? No   Is the patient a risk to others? No   Has the patient been a risk to others in the past 6 months? No   Has the patient been a risk to others within the distant past? No   Past Medical History: No past medical history on file.  Past Surgical History:  Procedure Laterality Date  . HERNIA REPAIR      Family History: No family history on file.  Social History:  Social History   Socioeconomic History  . Marital status: Single    Spouse name: Not on file  . Number of children: Not on file  . Years of education: Not on file  . Highest education level: Not on file  Occupational History  . Not on file  Tobacco Use  . Smoking status: Current Some Day Smoker    Packs/day: 0.25    Years: 20.00    Pack years: 5.00    Types: Cigarettes  . Smokeless tobacco: Never Used  Vaping Use  . Vaping Use: Every day  Substance and Sexual Activity  . Alcohol use: Yes    Comment: Occasionally   . Drug use: Yes    Types: Marijuana, Cocaine, Heroin  . Sexual activity: Yes    Birth control/protection: Condom  Other Topics Concern  . Not on file  Social History Narrative  . Not on file   Social Determinants of Health   Financial  Resource Strain:   . Difficulty of Paying Living Expenses: Not on file  Food Insecurity:   . Worried About Programme researcher, broadcasting/film/videounning Out of Food in the Last Year: Not on file  . Ran Out of Food in the Last Year: Not on file  Transportation Needs:   . Lack of Transportation (Medical): Not on file  . Lack of Transportation (Non-Medical): Not on file  Physical Activity:   . Days of Exercise per Week: Not on file  . Minutes of Exercise per Session: Not on file  Stress:   . Feeling of Stress : Not on file  Social Connections:   . Frequency of Communication with Friends and Family: Not on file  . Frequency of Social Gatherings with Friends and Family: Not on file  . Attends Religious Services: Not on file  . Active Member  of Clubs or Organizations: Not on file  . Attends Banker Meetings: Not on file  . Marital Status: Not on file  Intimate Partner Violence:   . Fear of Current or Ex-Partner: Not on file  . Emotionally Abused: Not on file  . Physically Abused: Not on file  . Sexually Abused: Not on file    SDOH:  SDOH Screenings   Alcohol Screen:   . Last Alcohol Screening Score (AUDIT): Not on file  Depression (PHQ2-9): Medium Risk  . PHQ-2 Score: 25  Financial Resource Strain:   . Difficulty of Paying Living Expenses: Not on file  Food Insecurity:   . Worried About Programme researcher, broadcasting/film/video in the Last Year: Not on file  . Ran Out of Food in the Last Year: Not on file  Housing:   . Last Housing Risk Score: Not on file  Physical Activity:   . Days of Exercise per Week: Not on file  . Minutes of Exercise per Session: Not on file  Social Connections:   . Frequency of Communication with Friends and Family: Not on file  . Frequency of Social Gatherings with Friends and Family: Not on file  . Attends Religious Services: Not on file  . Active Member of Clubs or Organizations: Not on file  . Attends Banker Meetings: Not on file  . Marital Status: Not on file  Stress:   .  Feeling of Stress : Not on file  Tobacco Use: High Risk  . Smoking Tobacco Use: Current Some Day Smoker  . Smokeless Tobacco Use: Never Used  Transportation Needs:   . Freight forwarder (Medical): Not on file  . Lack of Transportation (Non-Medical): Not on file    Last Labs:  Admission on 05/25/2020, Discharged on 05/25/2020  Component Date Value Ref Range Status  . Sodium 05/25/2020 143  135 - 145 mmol/L Final  . Potassium 05/25/2020 3.5  3.5 - 5.1 mmol/L Final  . Chloride 05/25/2020 104  98 - 111 mmol/L Final  . CO2 05/25/2020 27  22 - 32 mmol/L Final  . Glucose, Bld 05/25/2020 90  70 - 99 mg/dL Final   Glucose reference range applies only to samples taken after fasting for at least 8 hours.  . BUN 05/25/2020 14  6 - 20 mg/dL Final  . Creatinine, Ser 05/25/2020 1.31* 0.61 - 1.24 mg/dL Final  . Calcium 00/92/3300 9.9  8.9 - 10.3 mg/dL Final  . Total Protein 05/25/2020 6.7  6.5 - 8.1 g/dL Final  . Albumin 76/22/6333 4.0  3.5 - 5.0 g/dL Final  . AST 54/56/2563 29  15 - 41 U/L Final  . ALT 05/25/2020 17  0 - 44 U/L Final  . Alkaline Phosphatase 05/25/2020 43  38 - 126 U/L Final  . Total Bilirubin 05/25/2020 0.6  0.3 - 1.2 mg/dL Final  . GFR, Estimated 05/25/2020 >60  >60 mL/min Final   Comment: (NOTE) Calculated using the CKD-EPI Creatinine Equation (2021)   . Anion gap 05/25/2020 12  5 - 15 Final   Performed at [facility], [address], [place], [place] [address]  . Alcohol, Ethyl (B) 05/25/2020 <10  <10 mg/dL Final   Comment: (NOTE) Lowest detectable limit for serum alcohol is 10 mg/dL.  For medical purposes only. Performed at [facility], [address], [place], [place] [address]   . Salicylate Lvl 05/25/2020 <7.0* 7.0 - 30.0 mg/dL Final   Performed at [facility],  [address], [place], [place] [address]  . Acetaminophen (Tylenol), Serum 05/25/2020 <10* 10 - 30 ug/mL Final   Comment: (NOTE) Therapeutic concentrations vary significantly. A  range of 10-30 ug/mL  may be an effective concentration for many patients. However, some  are best treated at concentrations outside of this range. Acetaminophen concentrations >150 ug/mL at 4 hours after ingestion  and >50 ug/mL at 12 hours after ingestion are often associated with  toxic reactions.  Performed at [facility], [address] [place], [place] [address]   . WBC 05/25/2020 6.3  4.0 - 10.5 K/uL Final  . RBC 05/25/2020 4.37  4.22 - 5.81 MIL/uL Final  . Hemoglobin 05/25/2020 14.0  13.0 - 17.0 g/dL Final  . HCT 82/95/6213 43.6  39 - 52 % Final  . MCV 05/25/2020 99.8  80.0 - 100.0 fL Final  . MCH 05/25/2020 32.0  26.0 - 34.0 pg Final  . MCHC 05/25/2020 32.1  30.0 - 36.0 g/dL Final  . RDW 08/65/7846 13.4  11.5 - 15.5 % Final  . Platelets 05/25/2020 239  150 - 400 K/uL Final  . nRBC 05/25/2020 0.0  0.0 - 0.2 % Final   Performed at [facility], [address] [place], [place] [address]  . Opiates 05/25/2020 NONE DETECTED  NONE DETECTED Final  . Cocaine 05/25/2020 POSITIVE* NONE DETECTED Final  . Benzodiazepines 05/25/2020 NONE DETECTED  NONE DETECTED Final  . Amphetamines 05/25/2020 NONE DETECTED  NONE DETECTED Final  . Tetrahydrocannabinol 05/25/2020 NONE DETECTED  NONE DETECTED Final  . Barbiturates 05/25/2020 NONE DETECTED  NONE DETECTED Final   Comment: (NOTE) DRUG SCREEN FOR MEDICAL PURPOSES ONLY.  IF CONFIRMATION IS NEEDED FOR ANY PURPOSE, NOTIFY LAB WITHIN 5 DAYS.  LOWEST DETECTABLE LIMITS FOR URINE DRUG SCREEN Drug Class                     Cutoff (ng/mL) Amphetamine and metabolites    1000 Barbiturate and metabolites    200 Benzodiazepine                 200 Tricyclics and metabolites     300 Opiates and metabolites        300 Cocaine and metabolites        300 THC                            50 Performed at [facility], [address], [place], [place] [address]   . SARS Coronavirus 2 by RT PCR 05/25/2020 NEGATIVE  NEGATIVE Final    Comment: (NOTE) SARS-CoV-2 target nucleic acids are NOT DETECTED.  The SARS-CoV-2 RNA is generally detectable in upper respiratory specimens during the acute phase of infection. The lowest concentration of SARS-CoV-2 viral copies this assay can detect is 138 copies/mL. A negative result does not preclude SARS-Cov-2 infection and should not be used as the sole basis for treatment or other patient management decisions. A negative result may occur with  improper specimen collection/handling, submission of specimen other than nasopharyngeal swab, presence of viral mutation(s) within the areas targeted by this assay, and inadequate number of viral copies(<138 copies/mL). A negative result must be combined with clinical observations, patient history, and epidemiological information. The expected result is Negative.  Fact Sheet for Patients:  BloggerCourse.com  Fact Sheet for Healthcare Providers:  SeriousBroker.it  This test is no  t yet approved or cleared by the [place] and  has been authorized for detection and/or diagnosis of SARS-CoV-2 by FDA under an Emergency Use Authorization (EUA). This EUA will remain  in effect (meaning this test can be used) for the duration of the COVID-19 declaration under Section 564(b)(1) of the Act, 21 U.S.C.section 360bbb-3(b)(1), unless the authorization is terminated  or revoked sooner.      . Influenza A by PCR 05/25/2020 NEGATIVE  NEGATIVE Final  . Influenza B by PCR 05/25/2020 NEGATIVE  NEGATIVE Final   Comment: (NOTE) The Xpert Xpress SARS-CoV-2/FLU/RSV plus assay is intended as an aid in the diagnosis of influenza from Nasopharyngeal swab specimens and should not be used as a sole basis for treatment. Nasal washings and aspirates are unacceptable for Xpert Xpress SARS-CoV-2/FLU/RSV testing.  Fact Sheet for  Patients: BloggerCourse.com  Fact Sheet for Healthcare Providers: SeriousBroker.it  This test is not yet approved or cleared by the [place] FDA and has been authorized for detection and/or diagnosis of SARS-CoV-2 by FDA under an Emergency Use Authorization (EUA). This EUA will remain in effect (meaning this test can be used) for the duration of the COVID-19 declaration under Section 564(b)(1) of the Act, 21 U.S.C. section 360bbb-3(b)(1), unless the authorization is terminated or revoked.  Performed at [facility], [address], [place], [place] [address]   Admission on 05/20/2020, Discharged on 05/20/2020  Component Date Value Ref Range Status  . WBC 05/20/2020 5.7  4.0 - 10.5 K/uL Final  . RBC 05/20/2020 4.36  4.22 - 5.81 MIL/uL Final  . Hemoglobin 05/20/2020 14.0  13.0 - 17.0 g/dL Final  . HCT 60/45/4098 43.1  39 - 52 % Final  . MCV 05/20/2020 98.9  80.0 - 100.0 fL Final  . MCH 05/20/2020 32.1  26.0 - 34.0 pg Final  . MCHC 05/20/2020 32.5  30.0 - 36.0 g/dL Final  . RDW 11/91/4782 13.2  11.5 - 15.5 % Final  . Platelets 05/20/2020 243  150 - 400 K/uL Final  . nRBC 05/20/2020 0.0  0.0 - 0.2 % Final  . Neutrophils Relative % 05/20/2020 63  % Final  . Neutro Abs 05/20/2020 3.6  1.7 - 7.7 K/uL Final  . Lymphocytes Relative 05/20/2020 26  % Final  . Lymphs Abs 05/20/2020 1.5  0.7 - 4.0 K/uL Final  . Monocytes Relative 05/20/2020 10  % Final  . Monocytes Absolute 05/20/2020 0.6  0.1 - 1.0 K/uL Final  . Eosinophils Relative 05/20/2020 1  % Final  . Eosinophils Absolute 05/20/2020 0.1  0.0 - 0.5 K/uL Final  . Basophils Relative 05/20/2020 0  % Final  . Basophils Absolute 05/20/2020 0.0  0.0 - 0.1 K/uL Final  . Immature Granulocytes 05/20/2020 0  % Final  . Abs Immature Granulocytes 05/20/2020 0.01  0.00 - 0.07 K/uL Final   Performed at [facility], [address], [place], [place] [address]  . Sodium 05/20/2020  141  135 - 145 mmol/L Final  . Potassium 05/20/2020 3.5  3.5 - 5.1 mmol/L Final  . Chloride 05/20/2020 105  98 - 111 mmol/L Final  . CO2 05/20/2020 25  22 - 32 mmol/L Final  . Glucose, Bld 05/20/2020 86  70 - 99 mg/dL Final   Glucose reference range applies only to samples taken after fasting for at least 8 hours.  . BUN 05/20/2020 18  6 - 20 mg/dL Final  . Creatinine, Ser 05/20/2020 1.39* 0.61 - 1.24 mg/dL Final  .  Calcium 05/20/2020 9.7  8.9 - 10.3 mg/dL Final  . Total Protein 05/20/2020 6.8  6.5 - 8.1 g/dL Final  . Albumin 16/03/9603 4.0  3.5 - 5.0 g/dL Final  . AST 54/02/8118 32  15 - 41 U/L Final  . ALT 05/20/2020 19  0 - 44 U/L Final  . Alkaline Phosphatase 05/20/2020 47  38 - 126 U/L Final  . Total Bilirubin 05/20/2020 1.0  0.3 - 1.2 mg/dL Final  . GFR, Estimated 05/20/2020 59* >60 mL/min Final   Comment: (NOTE) Calculated using the CKD-EPI Creatinine Equation (2021)   . Anion gap 05/20/2020 11  5 - 15 Final   Performed at [facility], [address], [place], [place] [address]  Admission on 04/26/2020, Discharged on 04/27/2020  Component Date Value Ref Range Status  . Sodium 04/26/2020 141  135 - 145 mmol/L Final  . Potassium 04/26/2020 3.7  3.5 - 5.1 mmol/L Final  . Chloride 04/26/2020 104  98 - 111 mmol/L Final  . CO2 04/26/2020 29  22 - 32 mmol/L Final  . Glucose, Bld 04/26/2020 122* 70 - 99 mg/dL Final   Glucose reference range applies only to samples taken after fasting for at least 8 hours.  . BUN 04/26/2020 17  6 - 20 mg/dL Final  . Creatinine, Ser 04/26/2020 1.42* 0.61 - 1.24 mg/dL Final  . Calcium 95/62/1308 9.9  8.9 - 10.3 mg/dL Final  . Total Protein 04/26/2020 7.1  6.5 - 8.1 g/dL Final  . Albumin 65/78/4696 4.1  3.5 - 5.0 g/dL Final  . AST 29/52/8413 24  15 - 41 U/L Final  . ALT 04/26/2020 17  0 - 44 U/L Final  . Alkaline Phosphatase 04/26/2020 45  38 - 126 U/L Final  . Total Bilirubin 04/26/2020 0.4  0.3 - 1.2 mg/dL Final  . GFR, Estimated 04/26/2020  58* >60 mL/min Final   Comment: (NOTE) Calculated using the CKD-EPI Creatinine Equation (2021)   . Anion gap 04/26/2020 8  5 - 15 Final   Performed at [facility], [address], [place], [place] [address]  . Alcohol, Ethyl (B) 04/26/2020 <10  <10 mg/dL Final   Comment: (NOTE) Lowest detectable limit for serum alcohol is 10 mg/dL.  For medical purposes only. Performed at [facility], [address], [place], [place] [address]   . Salicylate Lvl 04/26/2020 <7.0* 7.0 - 30.0 mg/dL Final   Performed at [facility], [address], [place], [place] [address]  . Acetaminophen (Tylenol), Serum 04/26/2020 <10* 10 - 30 ug/mL Final   Comment: (NOTE) Therapeutic concentrations vary significantly. A range of 10-30 ug/mL  may be an effective concentration for many patients. However, some  are best treated at concentrations outside of this range. Acetaminophen concentrations >150 ug/mL at 4 hours after ingestion  and >50 ug/mL at 12 hours after ingestion are often associated with  toxic reactions.  Performed at [facility], [address], [place], [place] [address]   . WBC 04/26/2020 4.7  4.0 - 10.5 K/uL Final  . RBC 04/26/2020 4.84  4.22 - 5.81 MIL/uL Final  . Hemoglobin 04/26/2020 15.7  13.0 - 17.0 g/dL Final  . HCT 25/95/6387 48.8  39 - 52 % Final  . MCV 04/26/2020 100.8* 80.0 - 100.0 fL Final  . MCH 04/26/2020 32.4  26.0 - 34.0 pg Final  . MCHC 04/26/2020 32.2  30.0 - 36.0 g/dL Final  . RDW 56/43/3295 13.4  11.5 -  15.5 % Final  . Platelets 04/26/2020 220  150 - 400 K/uL Final  . nRBC 04/26/2020 0.0  0.0 - 0.2 % Final   Performed at [facility], [address], [place], [place] [address]  . Opiates 04/26/2020 NONE DETECTED  NONE DETECTED Final  . Cocaine 04/26/2020 POSITIVE* NONE DETECTED Final  . Benzodiazepines 04/26/2020 NONE DETECTED  NONE DETECTED Final  . Amphetamines 04/26/2020 NONE DETECTED  NONE DETECTED Final  . Tetrahydrocannabinol  04/26/2020 NONE DETECTED  NONE DETECTED Final  . Barbiturates 04/26/2020 NONE DETECTED  NONE DETECTED Final   Comment: (NOTE) DRUG SCREEN FOR MEDICAL PURPOSES ONLY.  IF CONFIRMATION IS NEEDED FOR ANY PURPOSE, NOTIFY LAB WITHIN 5 DAYS.  LOWEST DETECTABLE LIMITS FOR URINE DRUG SCREEN Drug Class                     Cutoff (ng/mL) Amphetamine and metabolites    1000 Barbiturate and metabolites    200 Benzodiazepine                 200 Tricyclics and metabolites     300 Opiates and metabolites        300 Cocaine and metabolites        300 THC                            50 Performed at [facility], [address] [place], [place] [address]   . SARS Coronavirus 2 by RT PCR 04/27/2020 NEGATIVE  NEGATIVE Final   Comment: (NOTE) SARS-CoV-2 target nucleic acids are NOT DETECTED.  The SARS-CoV-2 RNA is generally detectable in upper respiratoy specimens during the acute phase of infection. The lowest concentration of SARS-CoV-2 viral copies this assay can detect is 131 copies/mL. A negative result does not preclude SARS-Cov-2 infection and should not be used as the sole basis for treatment or other patient management decisions. A negative result may occur with  improper specimen collection/handling, submission of specimen other than nasopharyngeal swab, presence of viral mutation(s) within the areas targeted by this assay, and inadequate number of viral copies (<131 copies/mL). A negative result must be combined with clinical observations, patient history, and epidemiological information. The expected result is Negative.  Fact Sheet for Patients:  https://www.moore.com/  Fact Sheet for Healthcare Providers:  https://www.young.biz/  This test is no                          t yet approved or cleared by the [place] FDA and  has been authorized for detection and/or diagnosis of SARS-CoV-2 by FDA under an Emergency Use Authorization  (EUA). This EUA will remain  in effect (meaning this test can be used) for the duration of the COVID-19 declaration under Section 564(b)(1) of the Act, 21 U.S.C. section 360bbb-3(b)(1), unless the authorization is terminated or revoked sooner.    . Influenza A by PCR 04/27/2020 NEGATIVE  NEGATIVE Final  . Influenza B by PCR 04/27/2020 NEGATIVE  NEGATIVE Final   Comment: (NOTE) The Xpert Xpress SARS-CoV-2/FLU/RSV assay is intended as an aid in  the diagnosis of influenza from Nasopharyngeal swab specimens and  should not be used as a sole basis for treatment. Nasal washings and  aspirates are unacceptable for Xpert Xpress SARS-CoV-2/FLU/RSV  testing.  Fact Sheet for Patients: https://www.moore.com/  Fact Sheet for Healthcare Providers: https://www.young.biz/  This test is not yet approved or  cleared by the [place] and  has been authorized for detection and/or diagnosis of SARS-CoV-2 by  FDA under an Emergency Use Authorization (EUA). This EUA will remain  in effect (meaning this test can be used) for the duration of the  Covid-19 declaration under Section 564(b)(1) of the Act, 21  U.S.C. section 360bbb-3(b)(1), unless the authorization is  terminated or revoked. Performed at [facility], [address] [place], [place] [address]   Admission on 04/24/2020, Discharged on 04/25/2020  Component Date Value Ref Range Status  . Sodium 04/24/2020 140  135 - 145 mmol/L Final  . Potassium 04/24/2020 3.4* 3.5 - 5.1 mmol/L Final  . Chloride 04/24/2020 105  98 - 111 mmol/L Final  . CO2 04/24/2020 24  22 - 32 mmol/L Final  . Glucose, Bld 04/24/2020 88  70 - 99 mg/dL Final   Glucose reference range applies only to samples taken after fasting for at least 8 hours.  . BUN 04/24/2020 16  6 - 20 mg/dL Final  . Creatinine, Ser 04/24/2020 1.45* 0.61 - 1.24 mg/dL Final  . Calcium 19/14/7829 9.5  8.9 - 10.3 mg/dL Final  . Total Protein  04/24/2020 6.7  6.5 - 8.1 g/dL Final  . Albumin 56/21/3086 4.2  3.5 - 5.0 g/dL Final  . AST 57/84/6962 26  15 - 41 U/L Final  . ALT 04/24/2020 15  0 - 44 U/L Final  . Alkaline Phosphatase 04/24/2020 44  38 - 126 U/L Final  . Total Bilirubin 04/24/2020 0.9  0.3 - 1.2 mg/dL Final  . GFR, Estimated 04/24/2020 56* >60 mL/min Final   Comment: (NOTE) Calculated using the CKD-EPI Creatinine Equation (2021)   . Anion gap 04/24/2020 11  5 - 15 Final   Performed at [facility], [address] [place], [place] [address]  . Alcohol, Ethyl (B) 04/24/2020 <10  <10 mg/dL Final   Comment: (NOTE) Lowest detectable limit for serum alcohol is 10 mg/dL.  For medical purposes only. Performed at [facility], [address], [place], [place] [address]   . Salicylate Lvl 04/24/2020 <7.0* 7.0 - 30.0 mg/dL Final   Performed at [facility], [address], [place], [place] [address]  . Acetaminophen (Tylenol), Serum 04/24/2020 <10* 10 - 30 ug/mL Final   Comment: (NOTE) Therapeutic concentrations vary significantly. A range of 10-30 ug/mL  may be an effective concentration for many patients. However, some  are best treated at concentrations outside of this range. Acetaminophen concentrations >150 ug/mL at 4 hours after ingestion  and >50 ug/mL at 12 hours after ingestion are often associated with  toxic reactions.  Performed at [facility], [address], [place], [place] [address]   . WBC 04/24/2020 5.3  4.0 - 10.5 K/uL Final  . RBC 04/24/2020 4.59  4.22 - 5.81 MIL/uL Final  . Hemoglobin 04/24/2020 14.9  13.0 - 17.0 g/dL Final  . HCT 44/08/4740 45.7  39 - 52 % Final  . MCV 04/24/2020 99.6  80.0 - 100.0 fL Final  . MCH 04/24/2020 32.5  26.0 - 34.0 pg Final  . MCHC 04/24/2020 32.6  30.0 - 36.0 g/dL Final  . RDW 59/56/3875 13.7  11.5 - 15.5 % Final  . Platelets 04/24/2020 226  150 - 400 K/uL Final  . nRBC 04/24/2020 0.0  0.0 - 0.2 % Final   Performed at [facility]  [facility], [address], [place], [place] [address]  . Opiates  04/24/2020 NONE DETECTED  NONE DETECTED Final  . Cocaine 04/24/2020 POSITIVE* NONE DETECTED Final  . Benzodiazepines 04/24/2020 NONE DETECTED  NONE DETECTED Final  . Amphetamines 04/24/2020 NONE DETECTED  NONE DETECTED Final  . Tetrahydrocannabinol 04/24/2020 NONE DETECTED  NONE DETECTED Final  . Barbiturates 04/24/2020 NONE DETECTED  NONE DETECTED Final   Comment: (NOTE) DRUG SCREEN FOR MEDICAL PURPOSES ONLY.  IF CONFIRMATION IS NEEDED FOR ANY PURPOSE, NOTIFY LAB WITHIN 5 DAYS.  LOWEST DETECTABLE LIMITS FOR URINE DRUG SCREEN Drug Class                     Cutoff (ng/mL) Amphetamine and metabolites    1000 Barbiturate and metabolites    200 Benzodiazepine                 200 Tricyclics and metabolites     300 Opiates and metabolites        300 Cocaine and metabolites        300 THC                            50 Performed at [facility], [address], [place], [place] [address]   . SARS Coronavirus 2 by RT PCR 04/25/2020 NEGATIVE  NEGATIVE Final   Comment: (NOTE) SARS-CoV-2 target nucleic acids are NOT DETECTED.  The SARS-CoV-2 RNA is generally detectable in upper respiratoy specimens during the acute phase of infection. The lowest concentration of SARS-CoV-2 viral copies this assay can detect is 131 copies/mL. A negative result does not preclude SARS-Cov-2 infection and should not be used as the sole basis for treatment or other patient management decisions. A negative result may occur with  improper specimen collection/handling, submission of specimen other than nasopharyngeal swab, presence of viral mutation(s) within the areas targeted by this assay, and inadequate number of viral copies (<131 copies/mL). A negative result must be combined with clinical observations, patient history, and epidemiological information. The expected result is Negative.  Fact Sheet for Patients:   https://www.moore.com/  Fact Sheet for Healthcare Providers:  https://www.young.biz/  This test is no                          t yet approved or cleared by the [place] FDA and  has been authorized for detection and/or diagnosis of SARS-CoV-2 by FDA under an Emergency Use Authorization (EUA). This EUA will remain  in effect (meaning this test can be used) for the duration of the COVID-19 declaration under Section 564(b)(1) of the Act, 21 U.S.C. section 360bbb-3(b)(1), unless the authorization is terminated or revoked sooner.    . Influenza A by PCR 04/25/2020 NEGATIVE  NEGATIVE Final  . Influenza B by PCR 04/25/2020 NEGATIVE  NEGATIVE Final   Comment: (NOTE) The Xpert Xpress SARS-CoV-2/FLU/RSV assay is intended as an aid in  the diagnosis of influenza from Nasopharyngeal swab specimens and  should not be used as a sole basis for treatment. Nasal washings and  aspirates are unacceptable for Xpert Xpress SARS-CoV-2/FLU/RSV  testing.  Fact Sheet for Patients: https://www.moore.com/  Fact Sheet for Healthcare Providers: https://www.young.biz/  This test is not yet approved or cleared by the [place] FDA and  has been authorized for detection and/or diagnosis of SARS-CoV-2 by  FDA under an Emergency Use Authorization (EUA). This EUA will remain  in effect (meaning this test can be used) for the duration of the  Covid-19 declaration under Section 564(b)(1) of the Act, 21  U.S.C. section 360bbb-3(b)(1), unless the authorization is  terminated or revoked. Performed at [facility], [address] [place], [place] [address]     Allergies: Patient has no known allergies.  PTA Medications: (Not in a hospital admission)   Medical Decision Making   Recommendations  Based on my evaluation the patient does not appear to have an emergency medical condition.  Gillermo Murdoch,  NP 05/26/20  4:54 AM

## 2020-05-26 NOTE — ED Notes (Signed)
Pt alert and oriented on the unit. Education, support, and encouragement provided. Discharge summary, medications and follow up appointments reviewed with pt. Suicide prevention resources provided. Pt's belongings in locker returned. Pt denies SI/HI, A/VH, pain, or any concerns at this time. Pt ambulatory on and off unit. Pt discharged to lobby. 

## 2020-05-26 NOTE — ED Provider Notes (Signed)
FBC/OBS ASAP Discharge Summary  Date and Time: 05/26/2020 1:00 PM  Name: Richard Moon  MRN:  [id number]   Discharge Diagnoses:  Final diagnoses:  MDD (major depressive disorder), severe (HCC)    Subjective: Patient states "I lost my mother last year around this time and I think this makes me more depressed."  Patient reports recent stressors include the loss of his mother approximately 1 year ago as well as substance use disorder and difficulty securing substance use treatment.  Patient resides in [place].  Patient denies access to weapons.  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  Patient endorses rare alcohol use.  Patient endorses substance use including cocaine, crack cocaine and marijuana daily.  Patient reports use of these substances chronically times approximately 20 years.  Patient reports last use of cocaine and crack cocaine on yesterday.  Patient reports he has recently been attempting to enroll and substance use treatment at [facility] in [place] however this unable to get treatment when they are at capacity.  Patient reports he has tried several times and plans to continue to follow-up with [facility] as he would prefer not to leave the [place] area.  Patient reports he is currently employed and would like to maintain his employment while seeking substance use treatment.  Patient assessed by nurse practitioner.  Patient alert and oriented, answers appropriately.  Patient pleasant cooperative during assessment.  Patient denies suicidal and homicidal ideations currently.  Patient endorses history of suicide attempts, last attempt approximately 1 year ago.  Patient denies both auditory and visual hallucinations.  There is no evidence of delusional thought content and no indication that patient is responding to internal stimuli.  Patient denies symptoms of paranoia.  Patient reports he has been diagnosed with depression in the past but has not  sought treatment nor followed up with outpatient psychiatry.  Patient denies any home medications currently.  Patient offered support and encouragement.  Discussed substance use as it relates to depressed mood, patient verbalizes understanding.    Stay Summary:  Per H&P assessment:  Richard Moon is a 59 year old male with a history of cocaine abuse, MDD presents to the ER with suicidal ideation.  Patient states that this time of year is the anniversary of his mother's death which "sent him to a dark place".  He has had thoughts of cutting his throat with a knife or cutting his arm he denies any homicidal ideations.  Endorses cocaine use, states last use was yesterday and was "a little".  He denies any chest pain, shortness of breath or any other complaints at this time.  He denies any visual hallucinations, states he will hear a weird ringing sound sometimes in his ears.  Denies any dizziness, syncope. During the patient assessment, he is alert and oriented x 4, pleasant, and cooperative. Speech is clear and coherent, normal pace, normal volume.  Mood is euthymic and affect is congruent with mood. Thought process is coherent. Patient denies visual and auditory hallucinations. No indication that patient is responding to internal stimuli. Denies paranoia. No evidence of delusional thought content. Denies suicidal ideations. Denies homicidal ideations.      Total Time spent with patient: 30 minutes  Past Psychiatric History: MDD, cocaine use disorder Past Medical History: No past medical history on file.  Past Surgical History:  Procedure Laterality Date   HERNIA REPAIR     Family History: No family history on file. 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: None reported Social History:  Social History  Substance and Sexual Activity  Alcohol Use Yes   Comment: Occasionally      Social History   Substance and Sexual Activity  Drug Use Yes   Types: Marijuana, Cocaine, Heroin    Social History    Socioeconomic History   Marital status: Single    Spouse name: Not on file   Number of children: Not on file   Years of education: Not on file   Highest education level: Not on file  Occupational History   Not on file  Tobacco Use   Smoking status: Current Some Day Smoker    Packs/day: 0.25    Years: 20.00    Pack years: 5.00    Types: Cigarettes   Smokeless tobacco: Never Used  Vaping Use   Vaping Use: Every day  Substance and Sexual Activity   Alcohol use: Yes    Comment: Occasionally    Drug use: Yes    Types: Marijuana, Cocaine, Heroin   Sexual activity: Yes    Birth control/protection: Condom  Other Topics Concern   Not on file  Social History Narrative   Not on file   Social Determinants of Health   Financial Resource Strain:    Difficulty of Paying Living Expenses: Not on file  Food Insecurity:    Worried About Programme researcher, broadcasting/film/video in the Last Year: Not on file   [employer] of Food in the Last Year: Not on file  Transportation Needs:    Lack of Transportation (Medical): Not on file   Lack of Transportation (Non-Medical): Not on file  Physical Activity:    Days of Exercise per Week: Not on file   Minutes of Exercise per Session: Not on file  Stress:    Feeling of Stress : Not on file  Social Connections:    Frequency of Communication with Friends and Family: Not on file   Frequency of Social Gatherings with Friends and Family: Not on file   Attends Religious Services: Not on file   Active Member of Clubs or Organizations: Not on file   Attends Banker Meetings: Not on file   Marital Status: Not on file   SDOH:  SDOH Screenings   Alcohol Screen:    Last Alcohol Screening Score (AUDIT): Not on file  Depression (PHQ2-9): Medium Risk   PHQ-2 Score: 25  Financial Resource Strain:    Difficulty of Paying Living Expenses: Not on file  Food Insecurity:    Worried About Programme researcher, broadcasting/film/video in the Last Year: Not on  file   [employer] of Food in the Last Year: Not on file  Housing:    Last Housing Risk Score: Not on file  Physical Activity:    Days of Exercise per Week: Not on file   Minutes of Exercise per Session: Not on file  Social Connections:    Frequency of Communication with Friends and Family: Not on file   Frequency of Social Gatherings with Friends and Family: Not on file   Attends Religious Services: Not on file   Active Member of Clubs or Organizations: Not on file   Attends Banker Meetings: Not on file   Marital Status: Not on file  Stress:    Feeling of Stress : Not on file  Tobacco Use: High Risk   Smoking Tobacco Use: Current Some Day Smoker   Smokeless Tobacco Use: Never Used  Transportation Needs:    Freight forwarder (Medical): Not on file   Lack  of Transportation (Non-Medical): Not on file    Has this patient used any form of tobacco in the last 30 days? (Cigarettes, Smokeless Tobacco, Cigars, and/or Pipes) A prescription for an FDA-approved tobacco cessation medication was offered at discharge and the patient refused  Current Medications:  Current Facility-Administered Medications  Medication Dose Route Frequency Provider Last Rate Last Admin   acetaminophen (TYLENOL) tablet 650 mg  650 mg Oral Q6H PRN Oneta RackLewis, Tanika N, NP       alum & mag hydroxide-simeth (MAALOX/MYLANTA) 200-200-20 MG/5ML suspension 30 mL  30 mL Oral Q4H PRN Oneta RackLewis, Tanika N, NP       magnesium hydroxide (MILK OF MAGNESIA) suspension 30 mL  30 mL Oral Daily PRN Oneta RackLewis, Tanika N, NP       traZODone (DESYREL) tablet 50 mg  50 mg Oral QHS PRN Oneta RackLewis, Tanika N, NP       No current outpatient medications on file.    PTA Medications: (Not in a hospital admission)   Musculoskeletal  Strength & Muscle Tone: within normal limits Gait & Station: normal Patient leans: N/A  Psychiatric Specialty Exam  Presentation  General Appearance: Appropriate for  Environment;Casual  Eye Contact:Good  Speech:Clear and Coherent;Normal Rate  Speech Volume:Normal  Handedness:Right   Mood and Affect  Mood:Euthymic  Affect:Appropriate;Congruent   Thought Process  Thought Processes:Coherent;Goal Directed  Descriptions of Associations:Intact  Orientation:Full (Time, Place and Person)  Thought Content:Logical;WDL  Hallucinations:Hallucinations: None  Ideas of Reference:None  Suicidal Thoughts:Suicidal Thoughts: No  Homicidal Thoughts:Homicidal Thoughts: No   Sensorium  Memory:Immediate Good;Recent Good;Remote Good  Judgment:Fair  Insight:Fair   Executive Functions  Concentration:Good  Attention Span:Good  Recall:Good  Fund of Knowledge:Good  Language:Good   Psychomotor Activity  Psychomotor Activity:Psychomotor Activity: Normal   Assets  Assets:Communication Skills;Desire for Improvement;Housing;Financial Resources/Insurance;Intimacy;Leisure Time;Resilience;Physical Health;Talents/Skills;Social Support   Sleep  Sleep:Sleep: Good   Physical Exam  Physical Exam Vitals and nursing note reviewed.  Constitutional:      Appearance: He is well-developed.  HENT:     Head: Normocephalic.  Cardiovascular:     Rate and Rhythm: Normal rate.  Pulmonary:     Effort: Pulmonary effort is normal.  Neurological:     Mental Status: He is alert and oriented to person, place, and time.  Psychiatric:        Attention and Perception: Attention and perception normal.        Mood and Affect: Affect normal. Mood is depressed.        Speech: Speech normal.        Behavior: Behavior normal. Behavior is cooperative.        Thought Content: Thought content normal.        Cognition and Memory: Cognition and memory normal.        Judgment: Judgment normal.    Review of Systems  Constitutional: Negative.   HENT: Negative.   Eyes: Negative.   Respiratory: Negative.   Cardiovascular: Negative.   Gastrointestinal: Negative.    Genitourinary: Negative.   Musculoskeletal: Negative.   Skin: Negative.   Neurological: Negative.   Endo/Heme/Allergies: Negative.   Psychiatric/Behavioral: Positive for depression and substance abuse.   Blood pressure 116/82, pulse 72, temperature 97.8 F (36.6 C), temperature source Temporal, resp. rate 18, height 5\' 11"  (1.803 m), weight 68 kg, SpO2 100 %. Body mass index is 20.92 kg/m.  Demographic Factors:  Male  Loss Factors: NA  Historical Factors: NA  Risk Reduction Factors:   Employed, Positive social support, Positive therapeutic relationship and  Positive coping skills or problem solving skills  Continued Clinical Symptoms:  Alcohol/Substance Abuse/Dependencies  Cognitive Features That Contribute To Risk:  None    Suicide Risk:  Minimal: No identifiable suicidal ideation.  Patients presenting with no risk factors but with morbid ruminations; may be classified as minimal risk based on the severity of the depressive symptoms  Plan Of Care/Follow-up recommendations:  Patient reviewed with Dr. Jannifer Franklin. Other:  Follow-up with outpatient psychiatry resources provided. Follow-up with substance use treatment provided resources.  Disposition: Discharge Patrcia Dolly, FNP 05/26/2020, 1:00 PM

## 2020-05-26 NOTE — ED Notes (Addendum)
Lunch given: roast beef sandwich, chips, and sprite

## 2020-05-26 NOTE — ED Notes (Signed)
Breakfast given: corn flakes, orange juice, banana nut muffin

## 2020-05-26 NOTE — Discharge Instructions (Addendum)
Patient is instructed prior to discharge to:  Take all medications as prescribed by his/her mental healthcare provider. Report any adverse effects and or reactions from the medicines to his/her outpatient provider promptly. Keep all scheduled appointments, to ensure that you are getting refills on time and to avoid any interruption in your medication.  If you are unable to keep an appointment call to reschedule.  Be sure to follow-up with resources and follow-up appointments provided.  Patient has been instructed & cautioned: To not engage in alcohol and or illegal drug use while on prescription medicines. In the event of worsening symptoms, patient is instructed to call the crisis hotline, 911 and or go to the nearest ED for appropriate evaluation and treatment of symptoms. To follow-up with his/her primary care provider for your other medical issues, concerns and or health care needs.   Substance abuse resources and Residential Options:  [facility] day residential substance abuse facility (not an option if you have active assault charges). [address], [place], [place] [address] Phone: [phone]: Ask for Shayla in admissions to complete intake if interested in pursuing this option.  [facility]: Can get intake scheduled; (not an option if you have active assault charges). [address] [place], [place] ([phone]) Call Mon-Fri.  [facility]): (offers outpatient therapy and intensive outpatient substance abuse therapy).  [address], [place], [place] [address] Phone: [phone]  [facility]: Offers FREE recovery skills classes, support groups, 1:1 Peer Support, and Compeer Classes. [address], [place], [place] [address] Phone: [phone] (Call to complete intake).   [facility] [address] [place], [place] [address] Phone: [phone] ext [phone]  [facility] provides food,  shelter and other programs and services to the homeless men of [place] through our men's program.  By offering safe shelter, three meals a day, clean clothing, Biblical counseling, financial planning, vocational training, GED/education and employment assistance, we've helped mend the shattered lives of many homeless men since opening in 1974.  We have approximately 267 beds available, with a max of 312 beds including mats for emergency situations and currently house an average of 270 men a night.  Prospective Client Check-In Information Photo ID Required (State/ Out of State/ DOC) - if photo ID is not available, clients are required to have a printout of a police/sheriff's criminal history report. Help out with chores around the Mission. No sex offender of any type (pending, charged, registered and/or any other sex related offenses) will be permitted to check in. Must be willing to abide by all rules, regulations, and policies established by the [facility]. The following will be provided - shelter, food, clothing, and biblical counseling. If you or someone you know is in need of assistance at our men's shelter in , [place], please call [phone] ext. [phone].  

## 2020-05-26 NOTE — ED Triage Notes (Signed)
Pt reports leaving [facility] today, states when he got home he used cocaine and is now feeling suicidal.

## 2020-05-26 NOTE — ED Notes (Signed)
Pt asleep with even and unlabored respirations. No distress or discomfort noted. Pt remains safe on the unit. Will continue to monitor. 

## 2020-05-27 NOTE — ED Notes (Signed)
This RN inventoried all of this pts belongings. Pt wanded by previously assigned staff. All belongings were placed in [address] of [place]. All valuables were secured with Security in [place] # [id number]. Pt signed all necessary documentation willingly.

## 2020-05-27 NOTE — Patient Outreach (Signed)
ED Peer Support Specialist Patient Intake (Complete at intake & 30-60 Day Follow-up)  Name: Richard Moon  MRN: [id number]  Age: 59 y.o.   Date of Admission: 05/27/2020  Intake: Initial Comments:      Primary Reason Admitted: Suicidal   Lab values: Alcohol/ETOH: Negative Positive UDS? Yes Amphetamines: No Barbiturates: No Benzodiazepines: No Cocaine: Yes Opiates: No Cannabinoids: No  Demographic information: Gender: Male Ethnicity: African American Marital Status: Single Insurance Status: Medicaid Ecologist (Work Neurosurgeon, Physicist, medical, etc.: No Lives with: Alone Living situation: Homeless  Reported Patient History: Patient reported health conditions: Bipolar disorder Patient aware of HIV and hepatitis status: No  In past year, has patient visited ED for any reason? Yes  Number of ED visits: 5  Reason(s) for visit: Various reasons  In past year, has patient been hospitalized for any reason? No  Number of hospitalizations:    Reason(s) for hospitalization:    In past year, has patient been arrested? No  Number of arrests:    Reason(s) for arrest:    In past year, has patient been incarcerated? No  Number of incarcerations:    Reason(s) for incarceration:    In past year, has patient received medication-assisted treatment? No  In past year, patient received the following treatments: Other (comment)  In past year, has patient received any harm reduction services? No  Did this include any of the following?    In past year, has patient received care from a mental health provider for diagnosis other than SUD? No  In past year, is this first time patient has overdosed? No  Number of past overdoses:    In past year, is this first time patient has been hospitalized for an overdose? No  Number of hospitalizations for overdose(s):    Is patient currently receiving treatment for a mental health diagnosis? No  Patient  reports experiencing difficulty participating in SUD treatment: No    Most important reason(s) for this difficulty?    Has patient received prior services for treatment? No  In past, patient has received services from following agencies:    Plan of Care:  Suggested follow up at these agencies/treatment centers:  [facility] facility)  Other information: CPSS met with Pt an are seeking treatment services for substance abuse. CPSS was made aware that Pt is willing to go today to check himself in to [facility]. [facility]PSS are waiting to hear back from [facility] facility.    Aaron Edelman Degraphenreid, CPSS  05/27/2020 11:19 AM

## 2020-05-27 NOTE — ED Notes (Signed)
Patient is with [facility] 

## 2020-05-27 NOTE — BH Assessment (Signed)
Richard Moon is a 59 year old male presenting to [facility] voluntarily with chief complaint of suicide ideation and continued drug use. Patient reports receiving services yesterday at [facility] due to [facility] and per chart review he was assessed on 05/25/20 and admitted to [facility] for overnight observation. Patient was psychiatrically cleared and discharged on 05/26/2020 and presented to [facility] the same day. Patient reports after discharge yesterday he went home, used drugs and felt bad about his use and continued to have SI so he came back to ED. Patient reports smoking $20 worth of crack cocaine yesterday. Patient states that his mother death anniversary is coming up and he feels like he is "cycling" and finding it hard to function mentally. Patient reports living in a "rooming house" and states "there is always drug temptation at the house". Patient reports difficulty staying clean living in a boarding house and he reports that he left the house and does not plan on returning and he acknowledges being homeless. Patient states that he has a job, but he is going to quit to get help with his mental health and substance use.    Patient does not have outpatient services and reports that he has never had any type of outpatient treatment before. Patient reports history of inpatient treatment in [place] but does not recall specifics around treatment. Patient feels he needs inpatient treatment and reports he needs help with his drug addiction. Patient states someone told him about [facility] services, and he would be open to receive services there.   Patient is oriented to person, place and situation, he is alert, engaged and cooperative. Patient eye contact and tone of voice is normal, his thoughts are clear and coherent, and his mood is depressed. Patient acknowledges depressive symptoms of anhedonia, crying spells, poor sleep, feeling irritable, helpless, hopeless and worthless. Patient reports SI with plan, means and intent to cut  himself with a knife. Patient does not contract for safety. Patient reports several suicidal attempts by cutting. Patient reports feeling paranoid "feels like someone is after me". Patient denies having prior diagnosis of schizophrenia. Patient denies HI and AVH.   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

## 2020-05-27 NOTE — ED Notes (Signed)
Breakfast tray delivered

## 2020-05-27 NOTE — Discharge Planning (Signed)
RNCM placed call to [facility] (June) to complete phone assessment.

## 2020-05-27 NOTE — Consult Note (Signed)
Richard Moon was seen and evaluated by TTS counselor.  Per treatment team and attending psychiatrist Lucianne Muss.  Patient to be discharged and follow-up with outpatient resource that was provided on 05/26/2020.  NP attempted to follow-up with EDP for discharge recommendation.

## 2020-05-27 NOTE — Patient Outreach (Signed)
CPSS contacted June at [facility] facility an were made aware that there maybe a bed available.  CPSS faxed Pt demographics an insurance information an are waiting for reply.

## 2020-05-27 NOTE — BHH Counselor (Signed)
Jacalyn Lefevre, RN, notified through secure chat that Marciano Sequin, NP, recommends overnight observation at [facility] with peer support consult.

## 2020-05-27 NOTE — ED Notes (Signed)
Lunch Tray Ordered @ 1039. 

## 2020-05-27 NOTE — ED Notes (Signed)
Patient Dinner tray ordered

## 2020-05-27 NOTE — ED Provider Notes (Signed)
[facility] EMERGENCY DEPARTMENT Provider Note   CSN: [id number] Arrival date & time: 05/26/20  2112     History Chief Complaint  Patient presents with  . Suicidal    Richard Moon is a 59 y.o. male.  The history is provided by medical records and the patient.    59 year old male presenting to the ED with suicidal ideation.  States he is coming up on the 1 year anniversary of his mother's murder.  States he has been in a very bad "mental state".  He has been self medicating with drugs, notably crack cocaine which helps temporarily then he feels worse because he knows that is not the answer.  Admits he was seen at behavioral health recently, states he felt a little better there but once he left he realized he needed more help.  Thinks he needs to go inpatient which he has done before.  He admits he lives in home with others, but does not have OP support.  States he is surrounded by drugs which is not good for him.  He has ongoing thoughts of wanting to cut himself and does have access to knives.  Denies access to firearms.  He states if sent back home, he truly thinks he will kill himself.  Denies HI/AVH.  Denies any other medical problems.  History reviewed. No pertinent past medical history.  Patient Active Problem List   Diagnosis Date Noted  . Cocaine-induced mood disorder (HCC)   . Cocaine dependence with cocaine-induced mood disorder (HCC)   . MDD (major depressive disorder), severe (HCC) 03/12/2018    Past Surgical History:  Procedure Laterality Date  . HERNIA REPAIR         No family history on file.  Social History   Tobacco Use  . Smoking status: Current Some Day Smoker    Packs/day: 0.25    Years: 20.00    Pack years: 5.00    Types: Cigarettes  . Smokeless tobacco: Never Used  Vaping Use  . Vaping Use: Every day  Substance Use Topics  . Alcohol use: Yes    Comment: Occasionally   . Drug use: Yes    Types: Marijuana, Cocaine, Heroin     Home Medications Prior to Admission medications   Not on File    Allergies    Patient has no known allergies.  Review of Systems   Review of Systems  Psychiatric/Behavioral: Positive for suicidal ideas.  All other systems reviewed and are negative.   Physical Exam Updated Vital Signs BP 115/77   Pulse 71   Temp 98.1 F (36.7 C) (Oral)   Resp 16   Ht 5\' 11"  (1.803 m)   Wt 72.6 kg   SpO2 96%   BMI 22.32 kg/m   Physical Exam Vitals and nursing note reviewed.  Constitutional:      Appearance: He is well-developed.  HENT:     Head: Normocephalic and atraumatic.  Eyes:     Conjunctiva/sclera: Conjunctivae normal.     Pupils: Pupils are equal, round, and reactive to light.  Cardiovascular:     Rate and Rhythm: Normal rate and regular rhythm.     Heart sounds: Normal heart sounds.  Pulmonary:     Effort: Pulmonary effort is normal.     Breath sounds: Normal breath sounds.  Abdominal:     General: Bowel sounds are normal.     Palpations: Abdomen is soft.  Musculoskeletal:        General: Normal range of  motion.     Cervical back: Normal range of motion.  Skin:    General: Skin is warm and dry.  Neurological:     Mental Status: He is alert and oriented to person, place, and time.  Psychiatric:     Comments: SI w/plan to cut self Denies HI/AVH     ED Results / Procedures / Treatments   Labs (all labs ordered are listed, but only abnormal results are displayed) Labs Reviewed  COMPREHENSIVE METABOLIC PANEL - Abnormal; Notable for the following components:      Result Value   Creatinine, Ser 1.26 (*)    Total Protein 6.4 (*)    All other components within normal limits  SALICYLATE LEVEL - Abnormal; Notable for the following components:   Salicylate Lvl <7.0 (*)    All other components within normal limits  ACETAMINOPHEN LEVEL - Abnormal; Notable for the following components:   Acetaminophen (Tylenol), Serum <10 (*)    All other components within normal  limits  CBC - Abnormal; Notable for the following components:   MCV 100.4 (*)    All other components within normal limits  RAPID URINE DRUG SCREEN, HOSP PERFORMED - Abnormal; Notable for the following components:   Cocaine POSITIVE (*)    All other components within normal limits  ETHANOL    EKG None  Radiology No results found.  Procedures Procedures (including critical care time)  Medications Ordered in ED Medications - No data to display  ED Course  I have reviewed the triage vital signs and the nursing notes.  Pertinent labs & imaging results that were available during my care of the patient were reviewed by me and considered in my medical decision making (see chart for details).    MDM Rules/Calculators/A&P  59 year old male presenting to the ED with suicidal ideation.  Recently observed at [facility] but reports symptoms worsening upon discharge.  Suicidal thoughts with plan to cut himself, states he is concerned for his own safety if sent home alone as he does not have any outpatient support.  Ongoing crack cocaine use, denies any other substance abuse.  Labs today are grossly reassuring.  UDS is positive for cocaine.  Patient medically cleared.  Has had recent Covid screening in the past 48 hours so will not repeat.  TTS evaluation pending.  Care will be signed out to oncoming provider to follow-up on TTS recommendations.  Final Clinical Impression(s) / ED Diagnoses Final diagnoses:  Suicidal ideation    Rx / DC Orders ED Discharge Orders    None       Garlon Hatchet, PA-C 05/27/20 0630    Gilda Crease, MD 05/27/20 [phone]

## 2020-05-27 NOTE — ED Notes (Signed)
Breakfast Tray ordered  

## 2020-05-28 NOTE — ED Notes (Signed)
Pt given belonging and valuables, safe transport called to take pt to [facility] appt at 745am

## 2020-06-06 ENCOUNTER — Telehealth (HOSPITAL_COMMUNITY): Payer: Self-pay | Admitting: General Practice

## 2020-06-06 NOTE — Telephone Encounter (Signed)
Care Management - Follow Up [facility] Discharges   Writer attempted to make contact with patient today and was unsuccessful.  Writer was able to leave a HIPPA compliant voice message and will await callback.  Per chart review, patient will be was discharged to services with [facility].

## 2020-07-03 ENCOUNTER — Emergency Department (HOSPITAL_COMMUNITY)
Admission: EM | Admit: 2020-07-03 | Discharge: 2020-07-04 | Disposition: A | Discharge status: Home / Self Care | Payer: Self-pay | Attending: Emergency Medicine | Admitting: Emergency Medicine

## 2020-07-03 ENCOUNTER — Other Ambulatory Visit: Payer: Self-pay

## 2020-07-03 DIAGNOSIS — R45851 Suicidal ideations: Secondary | ICD-10-CM | POA: Insufficient documentation

## 2020-07-03 DIAGNOSIS — F17210 Nicotine dependence, cigarettes, uncomplicated: Secondary | ICD-10-CM | POA: Insufficient documentation

## 2020-07-03 DIAGNOSIS — F1420 Cocaine dependence, uncomplicated: Secondary | ICD-10-CM | POA: Insufficient documentation

## 2020-07-03 DIAGNOSIS — F32A Depression, unspecified: Secondary | ICD-10-CM | POA: Insufficient documentation

## 2020-07-03 DIAGNOSIS — Z046 Encounter for general psychiatric examination, requested by authority: Secondary | ICD-10-CM | POA: Insufficient documentation

## 2020-07-03 DIAGNOSIS — Z20822 Contact with and (suspected) exposure to covid-19: Secondary | ICD-10-CM | POA: Insufficient documentation

## 2020-07-03 DIAGNOSIS — F329 Major depressive disorder, single episode, unspecified: Secondary | ICD-10-CM | POA: Insufficient documentation

## 2020-07-03 DIAGNOSIS — F1410 Cocaine abuse, uncomplicated: Secondary | ICD-10-CM

## 2020-07-03 LAB — CBC
HCT: 40.2 % (ref 39.0–52.0)
Hemoglobin: 14 g/dL (ref 13.0–17.0)
MCH: 33.3 pg (ref 26.0–34.0)
MCHC: 34.8 g/dL (ref 30.0–36.0)
MCV: 95.7 fL (ref 80.0–100.0)
Platelets: 225 10*3/uL (ref 150–400)
RBC: 4.2 MIL/uL — ABNORMAL LOW (ref 4.22–5.81)
RDW: 13 % (ref 11.5–15.5)
WBC: 7 10*3/uL (ref 4.0–10.5)
nRBC: 0 % (ref 0.0–0.2)

## 2020-07-03 LAB — COMPREHENSIVE METABOLIC PANEL
ALT: 30 U/L (ref 0–44)
AST: 32 U/L (ref 15–41)
Albumin: 4.1 g/dL (ref 3.5–5.0)
Alkaline Phosphatase: 46 U/L (ref 38–126)
Anion gap: 13 (ref 5–15)
BUN: 13 mg/dL (ref 6–20)
CO2: 24 mmol/L (ref 22–32)
Calcium: 9.6 mg/dL (ref 8.9–10.3)
Chloride: 103 mmol/L (ref 98–111)
Creatinine, Ser: 1.29 mg/dL — ABNORMAL HIGH (ref 0.61–1.24)
GFR, Estimated: >60 mL/min (ref >60)
Glucose, Bld: 98 mg/dL (ref 70–99)
Potassium: 3.5 mmol/L (ref 3.5–5.1)
Sodium: 140 mmol/L (ref 135–145)
Total Bilirubin: 0.7 mg/dL (ref 0.3–1.2)
Total Protein: 7.1 g/dL (ref 6.5–8.1)

## 2020-07-03 LAB — ETHANOL: Alcohol, Ethyl (B): <10 mg/dL (ref <10)

## 2020-07-03 LAB — RAPID URINE DRUG SCREEN, HOSP PERFORMED
Amphetamines: NOT DETECTED
Barbiturates: NOT DETECTED
Benzodiazepines: NOT DETECTED
Cocaine: POSITIVE — AB
Opiates: NOT DETECTED
Tetrahydrocannabinol: NOT DETECTED

## 2020-07-03 LAB — SALICYLATE LEVEL: Salicylate Lvl: <7 mg/dL — ABNORMAL LOW (ref 7.0–30.0)

## 2020-07-03 LAB — RESP PANEL BY RT-PCR (FLU A&B, COVID) ARPGX2
Influenza A by PCR: NEGATIVE
Influenza B by PCR: NEGATIVE
SARS Coronavirus 2 by RT PCR: NEGATIVE

## 2020-07-03 LAB — ACETAMINOPHEN LEVEL: Acetaminophen (Tylenol), Serum: <10 ug/mL — ABNORMAL LOW (ref 10–30)

## 2020-07-03 MED ORDER — DIPHENHYDRAMINE HCL 25 MG PO CAPS
25.0000 mg | ORAL_CAPSULE | Freq: Once | ORAL | Status: AC
  Administered 2020-07-03: 25 mg via ORAL
  Filled 2020-07-03: qty 1

## 2020-07-03 NOTE — ED Triage Notes (Signed)
Pt reports he's feeling suicidal, denies HI. States he used cocaine today. Cooperative in triage.

## 2020-07-04 ENCOUNTER — Encounter (HOSPITAL_COMMUNITY): Payer: Self-pay | Admitting: Student

## 2020-07-04 MED ORDER — DIPHENHYDRAMINE HCL 25 MG PO CAPS: 25.0000 mg | ORAL_CAPSULE | Freq: Four times a day (QID) | ORAL | Status: DC | PRN

## 2020-07-04 MED ORDER — ZOLPIDEM TARTRATE 5 MG PO TABS: 5.0000 mg | ORAL_TABLET | Freq: Every evening | ORAL | Status: DC | PRN

## 2020-07-04 MED ORDER — ACETAMINOPHEN 325 MG PO TABS: 650.0000 mg | ORAL_TABLET | ORAL | Status: DC | PRN

## 2020-07-04 MED ORDER — NICOTINE 21 MG/24HR TD PT24: 21.0000 mg | MEDICATED_PATCH | Freq: Every day | TRANSDERMAL | Status: DC

## 2020-07-04 MED ORDER — ALUM & MAG HYDROXIDE-SIMETH 200-200-20 MG/5ML PO SUSP: 30.0000 mL | Freq: Four times a day (QID) | ORAL | Status: DC | PRN

## 2020-07-04 MED ORDER — ONDANSETRON HCL 4 MG PO TABS: 4.0000 mg | ORAL_TABLET | Freq: Three times a day (TID) | ORAL | Status: DC | PRN

## 2020-07-04 NOTE — Progress Notes (Signed)
Case was staffed with me by TTS counselor Raelyn Mora, and I have reviewed chart. Patient is psych cleared for discharge.

## 2020-07-04 NOTE — ED Provider Notes (Signed)
[facility] EMERGENCY DEPARTMENT Provider Note   CSN: [id number] Arrival date & time: 07/03/20  2107     History Chief Complaint  Patient presents with  . Suicidal    Richard Moon is a 60 y.o. male with a hx of depression & cocaine use who presents to the ED with complaints of SI that started yesterday. Patient reports he has felt depressed, thoughts of suicide, plan to walk into traffic, no recent attempt, no alleviating/aggravating factors. Denies HI or hallucinations. Used cocaine earlier today, denies additional drug or alcohol use. Denies chest pain or dyspnea. States he does get intermittently itchy rash to the extremities.    HPI     No past medical history on file.  Patient Active Problem List   Diagnosis Date Noted  . Cocaine-induced mood disorder (HCC)   . Cocaine dependence with cocaine-induced mood disorder (HCC)   . MDD (major depressive disorder), severe (HCC) 03/12/2018    Past Surgical History:  Procedure Laterality Date  . HERNIA REPAIR         No family history on file.  Social History   Tobacco Use  . Smoking status: Current Some Day Smoker    Packs/day: 0.25    Years: 20.00    Pack years: 5.00    Types: Cigarettes  . Smokeless tobacco: Never Used  Vaping Use  . Vaping Use: Every day  Substance Use Topics  . Alcohol use: Yes    Comment: Occasionally   . Drug use: Yes    Types: Marijuana, Cocaine, Heroin    Home Medications Prior to Admission medications   Not on File    Allergies    Patient has no known allergies.  Review of Systems   Review of Systems  Constitutional: Negative for chills and fever.  Eyes: Negative for visual disturbance.  Respiratory: Negative for shortness of breath.   Cardiovascular: Negative for chest pain.  Gastrointestinal: Negative for abdominal pain.  Skin: Positive for rash.  Neurological: Negative for headaches.  Psychiatric/Behavioral: Positive for suicidal ideas. Negative for  hallucinations.  All other systems reviewed and are negative.   Physical Exam Updated Vital Signs BP 130/88 (BP Location: Right Arm)   Pulse (!) 111   Temp 98.8 F (37.1 C) (Oral)   Resp 16   SpO2 99%   Physical Exam Vitals and nursing note reviewed.  Constitutional:      General: He is not in acute distress.    Appearance: He is well-developed. He is not toxic-appearing.  HENT:     Head: Normocephalic and atraumatic.     Mouth/Throat:     Comments: No mm lesions. No angioedema. Patent airway. No stridor.  Eyes:     General:        Right eye: No discharge.        Left eye: No discharge.     Conjunctiva/sclera: Conjunctivae normal.  Cardiovascular:     Rate and Rhythm: Normal rate and regular rhythm.  Pulmonary:     Effort: Pulmonary effort is normal. No respiratory distress.     Breath sounds: Normal breath sounds. No wheezing, rhonchi or rales.  Abdominal:     General: There is no distension.     Palpations: Abdomen is soft.     Tenderness: There is no abdominal tenderness. There is no guarding or rebound.  Musculoskeletal:     Cervical back: Neck supple.  Skin:    General: Skin is warm and dry.     Coloration: Skin  is not jaundiced.     Findings: No rash. Rash is not macular, nodular, papular, pustular, scaling, urticarial or vesicular.     Comments: Specifically no rash to palm/soles.   Neurological:     Mental Status: He is alert.     Comments: Clear speech.   Psychiatric:        Thought Content: Thought content includes suicidal ideation. Thought content does not include homicidal ideation. Thought content does not include homicidal or suicidal plan.    ED Results / Procedures / Treatments   Labs (all labs ordered are listed, but only abnormal results are displayed) Labs Reviewed  COMPREHENSIVE METABOLIC PANEL - Abnormal; Notable for the following components:      Result Value   Creatinine, Ser 1.29 (*)    All other components within normal limits   SALICYLATE LEVEL - Abnormal; Notable for the following components:   Salicylate Lvl <7.0 (*)    All other components within normal limits  ACETAMINOPHEN LEVEL - Abnormal; Notable for the following components:   Acetaminophen (Tylenol), Serum <10 (*)    All other components within normal limits  CBC - Abnormal; Notable for the following components:   RBC 4.20 (*)    All other components within normal limits  RAPID URINE DRUG SCREEN, HOSP PERFORMED - Abnormal; Notable for the following components:   Cocaine POSITIVE (*)    All other components within normal limits  RESP PANEL BY RT-PCR (FLU A&B, COVID) [id number]  ETHANOL    EKG None  Radiology No results found.  Procedures Procedures (including critical care time)  Medications Ordered in ED Medications  diphenhydrAMINE (BENADRYL) capsule 25 mg (25 mg Oral Given 07/03/20 2313)    ED Course  I have reviewed the triage vital signs and the nursing notes.  Pertinent labs & imaging results that were available during my care of the patient were reviewed by me and considered in my medical decision making (see chart for details).    MDM Rules/Calculators/A&P                         Patient presents to the ED with complaints of SI.  Additional history obtained:  Additional history obtained from chart review & nursing note review.   Lab Tests:  Labs ordered per triage protocol which I personally reviewed and interpreted, mildly elevated creatinine similar to prior, UDS consistent with patient's reported cocaine use, otherwise unremarkable.  ED Course:  Patient is nontoxic, resting comfortably, initial tachycardia resolved on my exam.  Overall physical exam is fairly unremarkable.  He did mention an intermittent pruritic rash- given benadryl by triage with improvement, no current visible rash, specifically no MM/palm/sole rashes noted, no angioedema, patent airway. At this time clinically do not suspect  SJS, TEN, TSS, tick borne  illness, syphilis or other life-threatening condition. Will give benadryl PRN. Patient medically cleared.   01:30: CONSULT: Discussed with APP [place] Abts with [facility] team in regards to possible [facility] transfer for TTS, per current protocol which was discussed with [facility]- recommends TTS consult in the ED with subsequent [facility] recommendations.   TTS consult & holding orders placed. Disposition per [facility].   The patient has been placed in psychiatric observation due to the need to provide a safe environment for the patient while obtaining psychiatric consultation and evaluation, as well as ongoing medical and medication management to treat the patient's condition.  The patient has not been placed under full IVC at this  time.  Portions of this note were generated with Scientist, clinical (histocompatibility and immunogenetics). Dictation errors may occur despite best attempts at proofreading.  Final Clinical Impression(s) / ED Diagnoses Final diagnoses:  None    Rx / DC Orders ED Discharge Orders    None       Desmond Lope 07/04/20 1225    Dione Booze, MD 07/04/20 2241

## 2020-07-04 NOTE — Progress Notes (Signed)
Spoke with Harvie Heck, PA-C via phone regarding the patient. Harvie Heck, PA-C notified me that she wishes for the patient to be evaluated by Psychiatry. Recommended that a TTS consult be placed for the patient so the patient can be evaluated by Psychiatry. Samantha Petrucelli, PA-C verbalizes understanding and agreement of the plan and states she will place a TTS consult for the patient.

## 2020-07-04 NOTE — ED Notes (Signed)
Breakfast Ordered 

## 2020-07-04 NOTE — BH Assessment (Signed)
Comprehensive Clinical Assessment (CCA) Screening, Triage and Referral Note  07/04/2020 Richard Moon [id number]   Richard Moon is an 60 y.o male who presents to [facility] ED voluntarily for treatment. Per triage note, Pt reports he's feeling suicidal, denies HI. States he used cocaine today. Cooperative in triage.  During TTS assessment pt presents alert, calm, depressed, and oriented x 5, restless but cooperative, and mood-congruent with affect. The pt does not appear to be responding to internal or external stimuli. Neither is the pt presenting with any delusional thinking. Pt verified the information provided to triage RN. Pt identified his main complaint to be SI and SA (Cocaine). Pt reports a recent discharge from [facility] on 06/26/20 after 28 days of sobriety and relapsing the same day. Pt reports daily use of cocaine for over 20 years. Pt identified depression as the trigger to his relapse. Pt reports endorsing mood swings, racing thoughts and overwhelming body sensations stating, "it feels like something is crawling all over my body" for the last few days. Pt reports endorsing SI before and after using cocaine but denies any attempts or current plan. Pt reports a MH hx of bipolar disorder but denies any compliance with medications for over a year. Pt denies a hx of INPT or current OPT. Pt reports feeling sad and depressed due to homelessness and lack of supports. Pt reports to need INPT at a place that will keep him longer than 28 days. Pt denies any current HI/AH/VH but continues to endorse SI with no plan and reports to have no collateral to provide at this time.   Final disposition is pending psych consult   Chief Complaint:  Chief Complaint  Patient presents with  . Suicidal  . Depression   Visit Diagnosis: Cocaine Dependence; MDD   Patient Reported Information How did you hear about [place]? Self   Referral name: -- (Self)   Referral phone number: No data recorded Whom do you see for  routine medical problems? I don't have a doctor   Practice/Facility Name: No data recorded  Practice/Facility Phone Number: No data recorded  Name of Contact: No data recorded  Contact Number: No data recorded  Contact Fax Number: No data recorded  Prescriber Name: No data recorded  Prescriber Address (if known): No data recorded What Is the Reason for Your Visit/Call Today? SI & Depression  How Long Has This Been Causing You Problems? <Week  Have You Recently Been in Any Inpatient Treatment (Hospital/Detox/Crisis Center/28-Day Program)? Yes   Name/Location of Program/Hospital:[facility] [place]   How Long Were You There? 28 days   When Were You Discharged? 06/26/2020  Have You Ever Received Services From [facility] Before? Yes   Who Do You [facility] at [facility]? [facility], patient has had multiple visits to the Ed in the past month  Have You Recently Had Any Thoughts About Hurting Yourself? Yes   Are You Planning to Commit Suicide/Harm Yourself At This time?  No  Have you Recently Had Thoughts About Hurting Someone Karolee Ohs? No   Explanation: No data recorded Have You Used Any Alcohol or Drugs in the Past 24 Hours? Yes   How Long Ago Did You Use Drugs or Alcohol?  0000 (Last pm)   What Did You Use and How Much? Patient states that he used cocaine yesterday  What Do You Feel Would Help You the Most Today? Therapy; Medication  Do You Currently Have a Therapist/Psychiatrist? No   Name of Therapist/Psychiatrist: No data recorded  Have You  Been Recently Discharged From Any Office Practice or Programs? No   Explanation of Discharge From Practice/Program:  No data recorded    CCA Screening Triage Referral Assessment Type of Contact: Tele-Assessment   Is this Initial or Reassessment? Initial Assessment   Date Telepsych consult ordered in [facility]:  07/03/2020   Time Telepsych consult ordered in [facility]:  2107  Patient Reported Information Reviewed? Yes   Patient Left Without  Being Seen? No data recorded  Reason for Not Completing Assessment: No data recorded Collateral Involvement: no collateral available  Does Patient Have a Court Appointed Legal Guardian? No data recorded  Name and Contact of Legal Guardian:  No data recorded If Minor and Not Living with Parent(s), Who has Custody? No data recorded Is CPS involved or ever been involved? Never  Is APS involved or ever been involved? Never  Patient Determined To Be At Risk for Harm To Self or Others Based on Review of Patient Reported Information or Presenting Complaint? No   Method: No data recorded  Availability of Means: No data recorded  Intent: No data recorded  Notification Required: No data recorded  Additional Information for Danger to Others Potential:  No data recorded  Additional Comments for Danger to Others Potential:  No data recorded  Are There Guns or Other Weapons in Your Home?  No data recorded   Types of Guns/Weapons: No data recorded   Are These Weapons Safely Secured?                              No data recorded   Who Could Verify You Are Able To Have These Secured:    No data recorded Do You Have any Outstanding Charges, Pending Court Dates, Parole/Probation? No data recorded Contacted To Inform of Risk of Harm To Self or Others: Other: Comment (no one locally to notify)  Location of Assessment: [facility] ED  Does Patient Present under Involuntary Commitment? No   IVC Papers Initial File Date: No data recorded  [place] of Residence: [place]  Patient Currently Receiving the Following Services: Not Receiving Services   Determination of Need: Emergent (2 hours)   Options For Referral: Intensive Outpatient Therapy; Medication Management   Opal Sidles, LCSWA

## 2020-07-04 NOTE — ED Notes (Signed)
Lunch Tray Ordered @ 1022. 

## 2020-07-04 NOTE — Discharge Instructions (Addendum)
Follow up per behavioral health. 

## 2020-07-19 ENCOUNTER — Ambulatory Visit (HOSPITAL_COMMUNITY): Payer: No Payment, Other | Admitting: Physician Assistant

## 2020-07-31 ENCOUNTER — Other Ambulatory Visit: Payer: Self-pay

## 2020-07-31 ENCOUNTER — Telehealth (HOSPITAL_COMMUNITY): Payer: No Payment, Other | Admitting: Psychiatry

## 2021-01-26 ENCOUNTER — Other Ambulatory Visit: Payer: Self-pay

## 2021-01-26 ENCOUNTER — Encounter (HOSPITAL_COMMUNITY): Payer: Self-pay | Admitting: Emergency Medicine

## 2021-01-26 ENCOUNTER — Emergency Department (HOSPITAL_COMMUNITY)
Admission: EM | Admit: 2021-01-26 | Discharge: 2021-01-29 | Disposition: A | Discharge status: Home / Self Care | Payer: 59 | Attending: Emergency Medicine | Admitting: Emergency Medicine

## 2021-01-26 DIAGNOSIS — F332 Major depressive disorder, recurrent severe without psychotic features: Secondary | ICD-10-CM | POA: Insufficient documentation

## 2021-01-26 DIAGNOSIS — F1424 Cocaine dependence with cocaine-induced mood disorder: Secondary | ICD-10-CM | POA: Diagnosis present

## 2021-01-26 DIAGNOSIS — F1910 Other psychoactive substance abuse, uncomplicated: Secondary | ICD-10-CM | POA: Diagnosis not present

## 2021-01-26 DIAGNOSIS — F17210 Nicotine dependence, cigarettes, uncomplicated: Secondary | ICD-10-CM | POA: Insufficient documentation

## 2021-01-26 DIAGNOSIS — R45851 Suicidal ideations: Secondary | ICD-10-CM

## 2021-01-26 DIAGNOSIS — F1494 Cocaine use, unspecified with cocaine-induced mood disorder: Secondary | ICD-10-CM | POA: Diagnosis not present

## 2021-01-26 DIAGNOSIS — Z20822 Contact with and (suspected) exposure to covid-19: Secondary | ICD-10-CM | POA: Insufficient documentation

## 2021-01-26 LAB — CBC WITH DIFFERENTIAL/PLATELET
Abs Immature Granulocytes: 0.01 10*3/uL (ref 0.00–0.07)
Basophils Absolute: 0 10*3/uL (ref 0.0–0.1)
Basophils Relative: 0 %
Eosinophils Absolute: 0.2 10*3/uL (ref 0.0–0.5)
Eosinophils Relative: 5 %
HCT: 42.1 % (ref 39.0–52.0)
Hemoglobin: 14.2 g/dL (ref 13.0–17.0)
Immature Granulocytes: 0 %
Lymphocytes Relative: 39 %
Lymphs Abs: 1.8 10*3/uL (ref 0.7–4.0)
MCH: 34.1 pg — ABNORMAL HIGH (ref 26.0–34.0)
MCHC: 33.7 g/dL (ref 30.0–36.0)
MCV: 101 fL — ABNORMAL HIGH (ref 80.0–100.0)
Monocytes Absolute: 0.4 10*3/uL (ref 0.1–1.0)
Monocytes Relative: 8 %
Neutro Abs: 2.1 10*3/uL (ref 1.7–7.7)
Neutrophils Relative %: 48 %
Platelets: 180 10*3/uL (ref 150–400)
RBC: 4.17 MIL/uL — ABNORMAL LOW (ref 4.22–5.81)
RDW: 13.5 % (ref 11.5–15.5)
WBC: 4.5 10*3/uL (ref 4.0–10.5)
nRBC: 0 % (ref 0.0–0.2)

## 2021-01-26 NOTE — ED Provider Notes (Signed)
Emergency Medicine Provider Triage Evaluation Note  Richard Moon , a 60 y.o. male  was evaluated in triage.  Pt complains of SI. He has a plan to jump in front of a car. Admits to wanting to hurt his girlfriend, but no one else. No auditory/visual hallucinations. Patient notes he has been having a hard time this week and got back into drug and alcohol. Admits to using cocaine.   Review of Systems  Positive: SI Negative: fever  Physical Exam  Ht 5\' 11"  (1.803 m)   Wt 70 kg   BMI 21.52 kg/m  Gen:   Awake, no distress   Resp:  Normal effort  MSK:   Moves extremities without difficulty  Other:    Medical Decision Making  Medically screening exam initiated at 11:09 PM.  Appropriate orders placed.  was informed that the remainder of the evaluation will be completed by another provider, this initial triage assessment does not replace that evaluation, and the importance of remaining in the ED until their evaluation is complete.  Medical clearance labs   [name] 01/26/21 2311    2312, MD 01/31/21 [phone]

## 2021-01-26 NOTE — ED Triage Notes (Signed)
Patient reports suicidal ideation plans to walk in front of cars , denies hallucinations .

## 2021-01-27 LAB — COMPREHENSIVE METABOLIC PANEL
ALT: 22 U/L (ref 0–44)
AST: 38 U/L (ref 15–41)
Albumin: 3.2 g/dL — ABNORMAL LOW (ref 3.5–5.0)
Alkaline Phosphatase: 41 U/L (ref 38–126)
Anion gap: 7 (ref 5–15)
BUN: 15 mg/dL (ref 6–20)
CO2: 23 mmol/L (ref 22–32)
Calcium: 8.6 mg/dL — ABNORMAL LOW (ref 8.9–10.3)
Chloride: 109 mmol/L (ref 98–111)
Creatinine, Ser: 1.49 mg/dL — ABNORMAL HIGH (ref 0.61–1.24)
GFR, Estimated: 54 mL/min — ABNORMAL LOW (ref >60)
Glucose, Bld: 124 mg/dL — ABNORMAL HIGH (ref 70–99)
Potassium: 3.7 mmol/L (ref 3.5–5.1)
Sodium: 139 mmol/L (ref 135–145)
Total Bilirubin: 0.5 mg/dL (ref 0.3–1.2)
Total Protein: 5.1 g/dL — ABNORMAL LOW (ref 6.5–8.1)

## 2021-01-27 LAB — RESP PANEL BY RT-PCR (FLU A&B, COVID) ARPGX2
Influenza A by PCR: NEGATIVE
Influenza B by PCR: NEGATIVE
SARS Coronavirus 2 by RT PCR: NEGATIVE

## 2021-01-27 LAB — SALICYLATE LEVEL: Salicylate Lvl: <7 mg/dL — ABNORMAL LOW (ref 7.0–30.0)

## 2021-01-27 LAB — RAPID URINE DRUG SCREEN, HOSP PERFORMED
Amphetamines: NOT DETECTED
Barbiturates: NOT DETECTED
Benzodiazepines: NOT DETECTED
Cocaine: POSITIVE — AB
Opiates: NOT DETECTED
Tetrahydrocannabinol: NOT DETECTED

## 2021-01-27 LAB — ACETAMINOPHEN LEVEL: Acetaminophen (Tylenol), Serum: <10 ug/mL — ABNORMAL LOW (ref 10–30)

## 2021-01-27 LAB — ETHANOL: Alcohol, Ethyl (B): <10 mg/dL (ref <10)

## 2021-01-27 NOTE — ED Notes (Signed)
Pt given sandwich bag and drink via sitter. Pt laying in bed watching tv. NAD Calm and cooperative with staff.

## 2021-01-27 NOTE — ED Provider Notes (Signed)
[facility] EMERGENCY DEPARTMENT Provider Note   CSN: [id number] Arrival date & time: 01/26/21  2247     History Chief Complaint  Patient presents with   Suicidal    Richard Moon is a 60 y.o. male.  Patient to ED with SI with plan to walk into traffic. No self harm prior to arrival. No AVH. States he is addicted to cocaine and alcohol. No other complaints.   The history is provided by the patient. No language interpreter was used.      History reviewed. No pertinent past medical history.  Patient Active Problem List   Diagnosis Date Noted   Cocaine-induced mood disorder (HCC)    Cocaine dependence with cocaine-induced mood disorder [facility])    MDD (major depressive disorder), severe (HCC) 03/12/2018    Past Surgical History:  Procedure Laterality Date   HERNIA REPAIR         No family history on file.  Social History   Tobacco Use   Smoking status: Some Days    Packs/day: 0.25    Years: 20.00    Pack years: 5.00    Types: Cigarettes   Smokeless tobacco: Never  Vaping Use   Vaping Use: Every day  Substance Use Topics   Alcohol use: Yes    Comment: Occasionally    Drug use: Yes    Types: Marijuana, Cocaine, Heroin    Home Medications Prior to Admission medications   Not on File    Allergies    Patient has no known allergies.  Review of Systems   Review of Systems  Constitutional:  Negative for chills and fever.  HENT: Negative.    Respiratory: Negative.    Cardiovascular: Negative.   Gastrointestinal: Negative.   Musculoskeletal: Negative.   Skin: Negative.   Neurological: Negative.   Psychiatric/Behavioral:  Positive for suicidal ideas. Negative for hallucinations and self-injury.    Physical Exam Updated Vital Signs BP 117/73 (BP Location: Right Arm)   Pulse 79   Temp 98.4 F (36.9 C) (Oral)   Resp 18   Ht 5\' 11"  (1.803 m)   Wt 70 kg   SpO2 99%   BMI 21.52 kg/m   Physical Exam Vitals and nursing note  reviewed.  Constitutional:      Appearance: He is well-developed.  HENT:     Head: Normocephalic.  Cardiovascular:     Rate and Rhythm: Normal rate and regular rhythm.     Heart sounds: No murmur heard. Pulmonary:     Effort: Pulmonary effort is normal.     Breath sounds: Normal breath sounds. No wheezing, rhonchi or rales.  Abdominal:     General: Bowel sounds are normal.     Palpations: Abdomen is soft.     Tenderness: There is no abdominal tenderness. There is no guarding or rebound.  Musculoskeletal:        General: Normal range of motion.     Cervical back: Normal range of motion and neck supple.  Skin:    General: Skin is warm and dry.  Neurological:     General: No focal deficit present.     Mental Status: He is alert and oriented to person, place, and time.  Psychiatric:        Attention and Perception: He does not perceive auditory or visual hallucinations.        Speech: Speech normal.        Behavior: Behavior is cooperative.        Thought Content:  Thought content includes suicidal ideation.    ED Results / Procedures / Treatments   Labs (all labs ordered are listed, but only abnormal results are displayed) Labs Reviewed  COMPREHENSIVE METABOLIC PANEL - Abnormal; Notable for the following components:      Result Value   Glucose, Bld 124 (*)    Creatinine, Ser 1.49 (*)    Calcium 8.6 (*)    Total Protein 5.1 (*)    Albumin 3.2 (*)    GFR, Estimated 54 (*)    All other components within normal limits  CBC WITH DIFFERENTIAL/PLATELET - Abnormal; Notable for the following components:   RBC 4.17 (*)    MCV 101.0 (*)    MCH 34.1 (*)    All other components within normal limits  SALICYLATE LEVEL - Abnormal; Notable for the following components:   Salicylate Lvl <7.0 (*)    All other components within normal limits  ACETAMINOPHEN LEVEL - Abnormal; Notable for the following components:   Acetaminophen (Tylenol), Serum <10 (*)    All other components within  normal limits  RESP PANEL BY RT-PCR (FLU A&B, COVID) ARPGX2  ETHANOL  RAPID URINE DRUG SCREEN, HOSP PERFORMED   Results for orders placed or performed during the hospital encounter of 01/26/21  Comprehensive metabolic panel  Result Value Ref Range   Sodium 139 135 - 145 mmol/L   Potassium 3.7 3.5 - 5.1 mmol/L   Chloride 109 98 - 111 mmol/L   CO2 23 22 - 32 mmol/L   Glucose, Bld 124 (H) 70 - 99 mg/dL   BUN 15 6 - 20 mg/dL   Creatinine, Ser 1.44 (H) 0.61 - 1.24 mg/dL   Calcium 8.6 (L) 8.9 - 10.3 mg/dL   Total Protein 5.1 (L) 6.5 - 8.1 g/dL   Albumin 3.2 (L) 3.5 - 5.0 g/dL   AST 38 15 - 41 U/L   ALT 22 0 - 44 U/L   Alkaline Phosphatase 41 38 - 126 U/L   Total Bilirubin 0.5 0.3 - 1.2 mg/dL   GFR, Estimated 54 (L) >60 mL/min   Anion gap 7 5 - 15  Ethanol  Result Value Ref Range   Alcohol, Ethyl (B) <10 <10 mg/dL  CBC with Diff  Result Value Ref Range   WBC 4.5 4.0 - 10.5 K/uL   RBC 4.17 (L) 4.22 - 5.81 MIL/uL   Hemoglobin 14.2 13.0 - 17.0 g/dL   HCT 31.5 40.0 - 86.7 %   MCV 101.0 (H) 80.0 - 100.0 fL   MCH 34.1 (H) 26.0 - 34.0 pg   MCHC 33.7 30.0 - 36.0 g/dL   RDW 61.9 50.9 - 32.6 %   Platelets 180 150 - 400 K/uL   nRBC 0.0 0.0 - 0.2 %   Neutrophils Relative % 48 %   Neutro Abs 2.1 1.7 - 7.7 K/uL   Lymphocytes Relative 39 %   Lymphs Abs 1.8 0.7 - 4.0 K/uL   Monocytes Relative 8 %   Monocytes Absolute 0.4 0.1 - 1.0 K/uL   Eosinophils Relative 5 %   Eosinophils Absolute 0.2 0.0 - 0.5 K/uL   Basophils Relative 0 %   Basophils Absolute 0.0 0.0 - 0.1 K/uL   Immature Granulocytes 0 %   Abs Immature Granulocytes 0.01 0.00 - 0.07 K/uL  Salicylate level  Result Value Ref Range   Salicylate Lvl <7.0 (L) 7.0 - 30.0 mg/dL  Acetaminophen level  Result Value Ref Range   Acetaminophen (Tylenol), Serum <10 (L) 10 - 30 ug/mL  EKG None  Radiology No results found.  Procedures Procedures   Medications Ordered in ED Medications - No data to display  ED Course  I have  reviewed the triage vital signs and the nursing notes.  Pertinent labs & imaging results that were available during my care of the patient were reviewed by me and considered in my medical decision making (see chart for details).    MDM Rules/Calculators/A&P                           Patient to ED with suicidal ideations with plan. History of polysubstance abuse.   TTS requested to evaluate the patient for appropriate disposition.   Final Clinical Impression(s) / ED Diagnoses Final diagnoses:  None   SI Polysubstance abuse  Rx / DC Orders ED Discharge Orders     None        Danne Harbor 01/27/21 0513    Dione Booze, MD 01/27/21 [phone]

## 2021-01-27 NOTE — BH Assessment (Addendum)
Comprehensive Clinical Assessment (CCA) Note  01/27/2021 Richard Moon [id number]  Disposition: Per Assunta Found, NP, patient is recommended for overnight observation for safety and stabilization.  1:1 sitter recommended  Flowsheet Row ED from 01/26/2021 in [facility] EMERGENCY DEPARTMENT ED from 05/26/2020 in [facility] EMERGENCY DEPARTMENT ED from 04/24/2020 in [facility] EMERGENCY DEPARTMENT  C-SSRS RISK CATEGORY High Risk High Risk High Risk      The patient demonstrates the following risk factors for suicide: Chronic risk factors for suicide include: psychiatric disorder of MDD, substance use disorder, and completed suicide in a family member. Acute risk factors for suicide include: unemployment and loss (financial, interpersonal, professional). Protective factors for this patient include:  none . Considering these factors, the overall suicide risk at this point appears to be high. Patient is not appropriate for outpatient follow up.   Richard Moon is a 60 year old male presenting to [facility] with chief complaint of SI with a plan to jump in front of car. Patient states "I feel down; don't feel good and I can't pick myself up. I don't feel comfortable being by myself".  Patient reports stressors related to losing his job last week, leaving his girlfriend house last night after an argument and continued substance use. Patient reports he lost his job due to being late and missing work related to his substance use. Patient reports that his girlfriend also uses drugs and when he got with her, he started using again. Patient was living with his girlfriend and now he is homeless.   Patient reports worsening depression for about a week. Patient reports symptoms of low mood, difficulty concentrating, anhedonia, irritability, feeling hopeless, and worthless and difficulty sleeping. Patient does not have outpatient services currently and reports that when  he did have services he never followed up with his appointments after he stated feeling better. Patient reports history of psychiatric hospitalization and residential rehab treatment. Patient reports going to [facility] about a year ago. Patient is without legal concerns. Patient reports using about $100 worth of cocaine daily and drinking about 2-3 beers daily.   Patient is oriented to person, place and situation. Patient is alert, engaged and cooperative. Patient eye contact and speech is normal. Patient affect is depressed and mood congruent. Patient reports suicidal ideation with a plan and can not contract for safety. Patient cannot provide protective factors and reports he wants to die. Patient reports one suicide attempt by cutting his wrist when he was 60 years old. Patient reports his 93 year old son committed suicide. TTS did not obtain when patient son committed suicide however patient is tearful during this moment. Patient denies supports in surrounding area, however, has a 58 year old daughter who lives in [place].  Patient denies HI/AVH and SIB. Patient does endorse some paranoia stating that he feels like people are always against him. Patient is not responding to internal/external stimuli. Patient is without delusional thoughts and does not appear to be manic or psychotic.   Patient is interested in going into [facility] residential treatment.   Chief Complaint:  Chief Complaint  Patient presents with   Suicidal   Visit Diagnosis: Cocaine dependence with cocaine-induced mood disorder    CCA Screening, Triage and Referral (STR)  Patient Reported Information How did you hear about [place]? Self  What Is the Reason for Your Visit/Call Today? SI and substance use  How Long Has This Been Causing You Problems? 1 wk - 1 month  What Do You Feel Would  Help You the Most Today? Therapy; Medication   Have You Recently Had Any Thoughts About Hurting Yourself? Yes  Are You Planning to Commit Suicide/Harm  Yourself At This time? Yes   Have you Recently Had Thoughts About Hurting Someone Karolee Ohs? No  Are You Planning to Harm Someone at This Time? No  Explanation: No data recorded  Have You Used Any Alcohol or Drugs in the Past 24 Hours? Yes  How Long Ago Did You Use Drugs or Alcohol? 0000 (Last pm)  What Did You Use and How Much? cocain and alcohol   Do You Currently Have a Therapist/Psychiatrist? No  Name of Therapist/Psychiatrist: No data recorded  Have You Been Recently Discharged From Any Office Practice or Programs? No  Explanation of Discharge From Practice/Program: No data recorded    CCA Screening Triage Referral Assessment Type of Contact: Tele-Assessment  Telemedicine Service Delivery: Telemedicine service delivery: This service was provided via telemedicine using a 2-way, interactive audio and video technology  Is this Initial or Reassessment? Initial Assessment  Date Telepsych consult ordered in [facility]:  01/27/21  Time Telepsych consult ordered in [facility]:  2107  Location of Assessment: [facility] ED  Provider Location: [facility] Assessment Services   Collateral Involvement: none   Does Patient Have a Automotive engineer Guardian? No data recorded Name and Contact of Legal Guardian: No data recorded If Minor and Not Living with Parent(s), Who has Custody? No data recorded Is CPS involved or ever been involved? Never  Is APS involved or ever been involved? Never   Patient Determined To Be At Risk for Harm To Self or Others Based on Review of Patient Reported Information or Presenting Complaint? Yes, for Self-Harm  Method: No data recorded Availability of Means: No data recorded Intent: No data recorded Notification Required: No data recorded Additional Information for Danger to Others Potential: No data recorded Additional Comments for Danger to Others Potential: No data recorded Are There Guns or Other Weapons in Your Home? No data recorded Types of Guns/Weapons: No  data recorded Are These Weapons Safely Secured?                            No data recorded Who Could Verify You Are Able To Have These Secured: No data recorded Do You Have any Outstanding Charges, Pending Court Dates, Parole/Probation? No data recorded Contacted To Inform of Risk of Harm To Self or Others: Other: Comment (no one locally to notify)    Does Patient Present under Involuntary Commitment? No  IVC Papers Initial File Date: No data recorded  [place] of Residence: [place]   Patient Currently Receiving the Following Services: Not Receiving Services   Determination of Need: Emergent (2 hours)   Options For Referral: Outpatient Therapy; Medication Management; Inpatient Hospitalization; Facility-Based Crisis     CCA Biopsychosocial Patient Reported Schizophrenia/Schizoaffective Diagnosis in Past: No   Strengths: Pt reports to be unsure   Mental Health Symptoms Depression:   Hopelessness; Change in energy/activity; Difficulty Concentrating; Irritability; Sleep (too much or little); Worthlessness   Duration of Depressive symptoms:  Duration of Depressive Symptoms: Greater than two weeks   Mania:   None   Anxiety:    Worrying; Restlessness   Psychosis:   None   Duration of Psychotic symptoms:    Trauma:   None   Obsessions:   None   Compulsions:   None   Inattention:   None   Hyperactivity/Impulsivity:   N/A  Oppositional/Defiant Behaviors:   None   Emotional Irregularity:   Potentially harmful impulsivity; Mood lability; Recurrent suicidal behaviors/gestures/threats   Other Mood/Personality Symptoms:  No data recorded   Mental Status Exam Appearance and self-care  Stature:   Tall   Weight:   Thin   Clothing:   Casual   Grooming:   Normal   Cosmetic use:   None   Posture/gait:   Normal   Motor activity:   Not Remarkable   Sensorium  Attention:   Normal   Concentration:   Normal   Orientation:   X5; Object;  Person   Recall/memory:   Normal   Affect and Mood  Affect:   Depressed   Mood:   Depressed   Relating  Eye contact:   Normal   Facial expression:   Depressed   Attitude toward examiner:   Cooperative   Thought and Language  Speech flow:  Normal   Thought content:   Appropriate to Mood and Circumstances   Preoccupation:   Suicide   Hallucinations:   None   Organization:  No data recorded  [employer] of Knowledge:   Average   Intelligence:   Average   Abstraction:   Normal   Judgement:   Poor   Reality Testing:   Realistic   Insight:   Poor   Decision Making:   Normal Industrial/product designer)   Social Functioning  Social Maturity:   Isolates   Social Judgement:   "Street Smart"   Stress  Stressors:   Housing; Surveyor, quantity; Relationship   Coping Ability:   Contractor Deficits:   Decision making   Supports:   Support needed     Religion: Religion/Spirituality Are You A Religious Person?: No  Leisure/Recreation: Leisure / Recreation Do You Have Hobbies?: No  Exercise/Diet: Exercise/Diet Do You Exercise?: No Have You Gained or Lost A Significant Amount of Weight in the Past Six Months?: No Do You Follow a Special Diet?: No Do You Have Any Trouble Sleeping?: Yes   CCA Employment/Education Employment/Work Situation: Employment / Work Situation Employment Situation: Unemployed Work Stressors: lost his job a week ago due to missing work and being late related to substance use Patient's Job has Been Impacted by Current Illness: Yes Has Patient ever Been in Equities trader?: Yes (Describe in comment)  Education: Education Is Patient Currently Attending School?: No Last Grade Completed: 10 Did You Product manager?: No Did You Have An Individualized Education Program (IIEP): No Did You Have Any Difficulty At School?: No   CCA Family/Childhood History Family and Relationship History: Family history Does patient have  children?: Yes How many children?: 2 How is patient's relationship with their children?: Son commited suicide and daughter lives out of state. Reports poor relationship with his daughter.  Childhood History:  Childhood History By whom was/is the patient raised?: Both parents Did patient suffer any verbal/emotional/physical/sexual abuse as a child?: No Has patient ever been sexually abused/assaulted/raped as an adolescent or adult?: No Witnessed domestic violence?: No Has patient been affected by domestic violence as an adult?: No  Child/Adolescent Assessment:     CCA Substance Use Alcohol/Drug Use: Alcohol / Drug Use Pain Medications: See MAR Prescriptions: See MAR Over the Counter: See MAR History of alcohol / drug use?: Yes (Cocaine) Longest period of sobriety (when/how long): Unknown Negative Consequences of Use: Financial, Work / Programmer, multimedia, Personal relationships Withdrawal Symptoms: Irritability Substance #1 Name of Substance 1: Alcohol 1 - Age of First Use: 18 1 -  Amount (size/oz): 3 beers daily 1 - Frequency: daily 1 - Duration: ongoing 1 - Last Use / Amount: yesterday Substance #2 Name of Substance 2: cocaine 2 - Age of First Use: 42 2 - Amount (size/oz): $100 2 - Frequency: daily 2 - Duration: ongoing 2 - Last Use / Amount: yesterday 2 - Method of Aquiring: smoking                     ASAM's:  Six Dimensions of Multidimensional Assessment  Dimension 1:  Acute Intoxication and/or Withdrawal Potential:   Dimension 1:  Description of individual's past and current experiences of substance use and withdrawal: Patient states that he experiences irritability when he is withdrawing from cocaine  Dimension 2:  Biomedical Conditions and Complications:   Dimension 2:  Description of patient's biomedical conditions and  complications: Patient has no medical issues that are complicated by his use of cocaine, but he putting his heart and kidneys at risk by using   Dimension 3:  Emotional, Behavioral, or Cognitive Conditions and Complications:  Dimension 3:  Description of emotional, behavioral, or cognitive conditions and complications: Patient uses substances to self-medicate his depression  Dimension 4:  Readiness to Change:  Dimension 4:  Description of Readiness to Change criteria: Patient states that he is ready to take the steps necessary to change his life and to live drug free  Dimension 5:  Relapse, Continued use, or Continued Problem Potential:  Dimension 5:  Relapse, continued use, or continued problem potential critiera description: Patient has a minimal history of clean time and he is at high risk for relapse unless he is able to identify new coping strategies that he can use to deter his use  Dimension 6:  Recovery/Living Environment:  Dimension 6:  Recovery/Iiving environment criteria description: Patient states that he is homeless and he has no emotional support  ASAM Severity Score: ASAM's Severity Rating Score: 13  ASAM Recommended Level of Treatment: ASAM Recommended Level of Treatment: Level III Residential Treatment   Substance use Disorder (SUD) Substance Use Disorder (SUD)  Checklist Symptoms of Substance Use: Substance(s) often taken in larger amounts or over longer times than was intended, Continued use despite having a persistent/recurrent physical/psychological problem caused/exacerbated by use, Continued use despite persistent or recurrent social, interpersonal problems, caused or exacerbated by use, Large amounts of time spent to obtain, use or recover from the substance(s), Recurrent use that results in a failure to fulfill major role obligations (work, school, home), Social, occupational, recreational activities given up or reduced due to use  Recommendations for Services/Supports/Treatments: Recommendations for Services/Supports/Treatments Recommendations For Services/Supports/Treatments: Residential-Level 3  Discharge  Disposition:    DSM5 Diagnoses: Patient Active Problem List   Diagnosis Date Noted   Cocaine-induced mood disorder (HCC)    Cocaine dependence with cocaine-induced mood disorder (HCC)    MDD (major depressive disorder), severe (HCC) 03/12/2018     Referrals to Alternative Service(s): Referred to Alternative Service(s):   Place:   Date:   Time:    Referred to Alternative Service(s):   Place:   Date:   Time:    Referred to Alternative Service(s):   Place:   Date:   Time:    Referred to Alternative Service(s):   Place:   Date:   Time:     Audree CamelFalencio L Olive Zmuda, [facility]

## 2021-01-27 NOTE — ED Notes (Signed)
Talked to psych via TTS.

## 2021-01-28 ENCOUNTER — Encounter (HOSPITAL_COMMUNITY): Payer: Self-pay | Admitting: Registered Nurse

## 2021-01-28 DIAGNOSIS — R45851 Suicidal ideations: Secondary | ICD-10-CM

## 2021-01-28 DIAGNOSIS — F1424 Cocaine dependence with cocaine-induced mood disorder: Secondary | ICD-10-CM

## 2021-01-28 DIAGNOSIS — F1910 Other psychoactive substance abuse, uncomplicated: Secondary | ICD-10-CM

## 2021-01-28 DIAGNOSIS — F1494 Cocaine use, unspecified with cocaine-induced mood disorder: Secondary | ICD-10-CM

## 2021-01-28 NOTE — ED Notes (Signed)
TTS done 

## 2021-01-28 NOTE — Consult Note (Signed)
Telepsych Consultation   Reason for Consult:  Suicidal ideation Referring Physician:  Danne Harbor Location of Patient: [facility] ED Location of Provider: Other: [facility]  Patient Identification: Richard Moon MRN:  [id number] Principal Diagnosis: <principal problem not specified> Diagnosis:  Active Problems:   Cocaine dependence with cocaine-induced mood disorder (HCC)   Cocaine-induced mood disorder (HCC)   Polysubstance abuse (HCC)   Suicidal ideations   Total Time spent with patient: 30 minutes  Subjective:   Richard Moon is a 60 y.o. male patient admitted to [facility] ED after presenting with complaints of suicidal ideation and a plan to walk out in front of a car.  Patient also complains of auditory hallucinations.  HPI:  Richard Moon, 60 y.o., male patient seen via tele health by this provider, consulted with Dr. Earlene Plater; and chart reviewed on 01/28/21.  On evaluation Richard Moon reports he came to the hospital because he felt like hurting or killing himself.  Patient reporting that there have been a lot of changes recently. "The end of my relationship, lose my job, lost everything."  Patient reporting he is feeling a little better today stating that his suicidal thoughts are more passive than active, but he is unable to contract for safety at this time.  Patient denies homicidal ideation, auditory/visual hallucinations, and paranoia.  Patient denies current outpatient psychiatric services.  Patient also reporting that prior to hospitalization he was living with someone but would be unable to go back there.  States he may have a friend in their will let him stay with him for a while if not would have to stay in a shelter.  Discussed community services with patient.  Patient reporting he has been to the [facility] before and "They don't help you with nothing they just talk shit to you.  And that is the place you want to go if you want to get high and tripped all day.  I'm not  saying that will help but they keep an average to all the time."   During evaluation Richard Moon is elevated up in bed in no acute distress.  He is alert, oriented x 4, calm and cooperative.  His mood is anxious and depressed with congruent affect.  He does not appear to be responding to internal/external stimuli or delusional thoughts.  Patient denies homicidal ideation, psychosis, and paranoia; patient continues to endorse some passive suicidal ideation with no plan or intent but is unable to contract for safety. Patient answered question appropriately.  Patient interested in rehab services, community services for substance use, and outpatient psychiatric services.  Past Psychiatric History: Polysubstance abuse, substance-induced mood disorder  Risk to Self: Patient denies active suicidal ideation at this time but is unable to contract for safety Risk to Others: Denies Prior Inpatient Therapy: Denies Prior Outpatient Therapy: Denies  Past Medical History: History reviewed. No pertinent past medical history.  Past Surgical History:  Procedure Laterality Date   HERNIA REPAIR     Family History: History reviewed. No pertinent family history. Family Psychiatric  History: None reported Social History:  Social History   Substance and Sexual Activity  Alcohol Use Yes   Comment: Occasionally      Social History   Substance and Sexual Activity  Drug Use Yes   Types: Marijuana, Cocaine, Heroin    Social History   Socioeconomic History   Marital status: Single    Spouse name: Not on file   Number of children: Not on file   Years of  education: Not on file   Highest education level: Not on file  Occupational History   Not on file  Tobacco Use   Smoking status: Some Days    Packs/day: 0.25    Years: 20.00    Pack years: 5.00    Types: Cigarettes   Smokeless tobacco: Never  Vaping Use   Vaping Use: Every day  Substance and Sexual Activity   Alcohol use: Yes    Comment:  Occasionally    Drug use: Yes    Types: Marijuana, Cocaine, Heroin   Sexual activity: Yes    Birth control/protection: Condom  Other Topics Concern   Not on file  Social History Narrative   Not on file   Social Determinants of Health   Financial Resource Strain: Not on file  Food Insecurity: Not on file  Transportation Needs: Not on file  Physical Activity: Not on file  Stress: Not on file  Social Connections: Not on file   Additional Social History:    Allergies:  No Known Allergies  Labs:  Results for orders placed or performed during the hospital encounter of 01/26/21 (from the past 48 hour(s))  Comprehensive metabolic panel     Status: Abnormal   Collection Time: 01/26/21 11:15 PM  Result Value Ref Range   Sodium 139 135 - 145 mmol/L   Potassium 3.7 3.5 - 5.1 mmol/L   Chloride 109 98 - 111 mmol/L   CO2 23 22 - 32 mmol/L   Glucose, Bld 124 (H) 70 - 99 mg/dL    Comment: Glucose reference range applies only to samples taken after fasting for at least 8 hours.   BUN 15 6 - 20 mg/dL   Creatinine, Ser 9.50 (H) 0.61 - 1.24 mg/dL   Calcium 8.6 (L) 8.9 - 10.3 mg/dL   Total Protein 5.1 (L) 6.5 - 8.1 g/dL   Albumin 3.2 (L) 3.5 - 5.0 g/dL   AST 38 15 - 41 U/L   ALT 22 0 - 44 U/L   Alkaline Phosphatase 41 38 - 126 U/L   Total Bilirubin 0.5 0.3 - 1.2 mg/dL   GFR, Estimated 54 (L) >60 mL/min    Comment: (NOTE) Calculated using the CKD-EPI Creatinine Equation (2021)    Anion gap 7 5 - 15    Comment: Performed at [facility], [address] [place], [place] [address]  Ethanol     Status: None   Collection Time: 01/26/21 11:15 PM  Result Value Ref Range   Alcohol, Ethyl (B) <10 <10 mg/dL    Comment: (NOTE) Lowest detectable limit for serum alcohol is 10 mg/dL.  For medical purposes only. Performed at [facility], [address] [place], [place] [address]   CBC with Diff     Status: Abnormal   Collection Time: 01/26/21 11:15 PM  Result Value Ref Range    WBC 4.5 4.0 - 10.5 K/uL   RBC 4.17 (L) 4.22 - 5.81 MIL/uL   Hemoglobin 14.2 13.0 - 17.0 g/dL   HCT 09.9 83.3 - 82.5 %   MCV 101.0 (H) 80.0 - 100.0 fL   MCH 34.1 (H) 26.0 - 34.0 pg   MCHC 33.7 30.0 - 36.0 g/dL   RDW 05.3 97.6 - 73.4 %   Platelets 180 150 - 400 K/uL   nRBC 0.0 0.0 - 0.2 %   Neutrophils Relative % 48 %   Neutro Abs 2.1 1.7 - 7.7 K/uL   Lymphocytes Relative 39 %   Lymphs Abs  1.8 0.7 - 4.0 K/uL   Monocytes Relative 8 %   Monocytes Absolute 0.4 0.1 - 1.0 K/uL   Eosinophils Relative 5 %   Eosinophils Absolute 0.2 0.0 - 0.5 K/uL   Basophils Relative 0 %   Basophils Absolute 0.0 0.0 - 0.1 K/uL   Immature Granulocytes 0 %   Abs Immature Granulocytes 0.01 0.00 - 0.07 K/uL    Comment: Performed at [facility], [address], [place], [place] [address]  Salicylate level     Status: Abnormal   Collection Time: 01/26/21 11:15 PM  Result Value Ref Range   Salicylate Lvl <7.0 (L) 7.0 - 30.0 mg/dL    Comment: Performed at [facility], [address], [place], [place] [address]  Acetaminophen level     Status: Abnormal   Collection Time: 01/26/21 11:15 PM  Result Value Ref Range   Acetaminophen (Tylenol), Serum <10 (L) 10 - 30 ug/mL    Comment: (NOTE) Therapeutic concentrations vary significantly. A range of 10-30 ug/mL  may be an effective concentration for many patients. However, some  are best treated at concentrations outside of this range. Acetaminophen concentrations >150 ug/mL at 4 hours after ingestion  and >50 ug/mL at 12 hours after ingestion are often associated with  toxic reactions.  Performed at [facility], [address] [place], [place] [address]   Urine rapid drug screen (hosp performed)     Status: Abnormal   Collection Time: 01/27/21  4:29 AM  Result Value Ref Range   Opiates NONE DETECTED NONE DETECTED   Cocaine POSITIVE (A) NONE DETECTED   Benzodiazepines NONE DETECTED NONE DETECTED   Amphetamines NONE DETECTED NONE  DETECTED   Tetrahydrocannabinol NONE DETECTED NONE DETECTED   Barbiturates NONE DETECTED NONE DETECTED    Comment: (NOTE) DRUG SCREEN FOR MEDICAL PURPOSES ONLY.  IF CONFIRMATION IS NEEDED FOR ANY PURPOSE, NOTIFY LAB WITHIN 5 DAYS.  LOWEST DETECTABLE LIMITS FOR URINE DRUG SCREEN Drug Class                     Cutoff (ng/mL) Amphetamine and metabolites    1000 Barbiturate and metabolites    200 Benzodiazepine                 200 Tricyclics and metabolites     300 Opiates and metabolites        300 Cocaine and metabolites        300 THC                            50 Performed at  [facility], [address] [place], [place] [address]   Resp Panel by RT-PCR (Flu A&B, Covid) Nasopharyngeal Swab     Status: None   Collection Time: 01/27/21  4:30 AM   Specimen: Nasopharyngeal Swab; Nasopharyngeal(NP) swabs in vial transport medium  Result Value Ref Range   SARS Coronavirus 2 by RT PCR NEGATIVE NEGATIVE    Comment: (NOTE) SARS-CoV-2 target nucleic acids are NOT DETECTED.  The SARS-CoV-2 RNA is generally detectable in upper respiratory specimens during the acute phase of infection. The lowest concentration of SARS-CoV-2 viral copies this assay can detect is 138 copies/mL. A negative result does not preclude SARS-Cov-2 infection and should not be used as the sole basis for treatment or other patient management decisions. A negative result may occur with  improper specimen collection/handling, submission of specimen other than  nasopharyngeal swab, presence of viral mutation(s) within the areas targeted by this assay, and inadequate number of viral copies(<138 copies/mL). A negative result must be combined with clinical observations, patient history, and epidemiological information. The expected result is Negative.  Fact Sheet for Patients:  BloggerCourse.comhttps://www.fda.gov/media/152166/download  Fact Sheet for Healthcare Providers:  SeriousBroker.ithttps://www.fda.gov/media/152162/download  This test  is no t yet approved or cleared by the [place] FDA and  has been authorized for detection and/or diagnosis of SARS-CoV-2 by FDA under an Emergency Use Authorization (EUA). This EUA will remain  in effect (meaning this test can be used) for the duration of the COVID-19 declaration under Section 564(b)(1) of the Act, 21 U.S.C.section 360bbb-3(b)(1), unless the authorization is terminated  or revoked sooner.       Influenza A by PCR NEGATIVE NEGATIVE   Influenza B by PCR NEGATIVE NEGATIVE    Comment: (NOTE) The Xpert Xpress SARS-CoV-2/FLU/RSV plus assay is intended as an aid in the diagnosis of influenza from Nasopharyngeal swab specimens and should not be used as a sole basis for treatment. Nasal washings and aspirates are unacceptable for Xpert Xpress SARS-CoV-2/FLU/RSV testing.  Fact Sheet for Patients: BloggerCourse.comhttps://www.fda.gov/media/152166/download  Fact Sheet for Healthcare Providers: SeriousBroker.ithttps://www.fda.gov/media/152162/download  This test is not yet approved or cleared by the [place] FDA and has been authorized for detection and/or diagnosis of SARS-CoV-2 by FDA under an Emergency Use Authorization (EUA). This EUA will remain in effect (meaning this test can be used) for the duration of the COVID-19 declaration under Section 564(b)(1) of the Act, 21 U.S.C. section 360bbb-3(b)(1), unless the authorization is terminated or revoked.  Performed at [facility], [address] [place], [place] [id number]     Medications:  No current facility-administered medications for this encounter.   No current outpatient medications on file.    Musculoskeletal: Strength & Muscle Tone: within normal limits Gait & Station: normal Patient leans: N/A   Psychiatric Specialty Exam:  Presentation  General Appearance: Appropriate for Environment  Eye Contact:Good  Speech:Clear and Coherent; Normal Rate  Speech Volume:Normal  Handedness:Right   Mood and Affect   Mood:Euthymic  Affect:Appropriate; Congruent   Thought Process  Thought Processes:Coherent; Goal Directed  Descriptions of Associations:Intact  Orientation:Full (Time, Place and Person)  Thought Content:WDL  History of Schizophrenia/Schizoaffective disorder:No  Duration of Psychotic Symptoms:Less than six months  Hallucinations:Hallucinations: None  Ideas of Reference:None  Suicidal Thoughts:Suicidal Thoughts: No  Homicidal Thoughts:Homicidal Thoughts: No   Sensorium  Memory:Immediate Good; Recent Good; Remote Good  Judgment:Intact  Insight:Present   Executive Functions  Concentration:Good  Attention Span:Good  Recall:Good  Fund of Knowledge:Good  Language:Good   Psychomotor Activity  Psychomotor Activity:Psychomotor Activity: Normal   Assets  Assets:Communication Skills; Desire for Improvement; Social Support; Resilience   Sleep  Sleep:Sleep: Good    Physical Exam: Physical Exam Vitals and nursing note reviewed. Exam conducted with a chaperone present.  Constitutional:      General: He is not in acute distress.    Appearance: Normal appearance. He is not ill-appearing.  Cardiovascular:     Rate and Rhythm: Normal rate.  Pulmonary:     Effort: Pulmonary effort is normal.  Neurological:     Mental Status: He is alert and oriented to person, place, and time.  Psychiatric:        Attention and Perception: Attention and perception normal. He does not perceive auditory or visual hallucinations.        Mood and Affect: Affect normal. Mood is anxious.  Speech: Speech normal.        Behavior: Behavior normal. Behavior is cooperative.        Thought Content: Thought content normal. Thought content is not paranoid or delusional. Thought content does not include homicidal or suicidal ideation.        Cognition and Memory: Cognition and memory normal.   Review of Systems  Constitutional: Negative.   HENT: Negative.    Eyes: Negative.    Respiratory: Negative.    Cardiovascular: Negative.   Gastrointestinal: Negative.   Genitourinary: Negative.   Musculoskeletal:  Positive for myalgias.  Skin: Negative.   Neurological: Negative.   Endo/Heme/Allergies: Negative.   Psychiatric/Behavioral:  Positive for substance abuse (Polysubstance; mainly cocaine). Depression: Stable. Hallucinations: Denies. Suicidal ideas: Denies at this time; stating he was feeling better and that he has calmed down.The patient does not have insomnia. Nervous/anxious: Stable.  Blood pressure (!) 145/79, pulse 63, temperature 98.2 F (36.8 C), temperature source Oral, resp. rate 16, height 5\' 11"  (1.803 m), weight 70 kg, SpO2 96 %. Body mass index is 21.52 kg/m.  Treatment Plan Summary: Daily contact with patient to assess and evaluate symptoms and progress in treatment, Medication management, and Plan facility based crisis admission  Disposition: Supportive therapy provided about ongoing stressors. Discussed crisis plan, support from social network, calling 911, coming to the Emergency Department, and calling Suicide Hotline. Refer to facility base crisis at [facility]  This service was provided via telemedicine using a 2-way, interactive audio and video technology.  Names of all persons participating in this telemedicine service and their role in this encounter. Name: [facility] Role: NP  Name: Assunta Found Role: Psychiatrist  Name: Earlene Plater Role: Patient  Name: Richard Promise, RN Role: Patient's nurse sent a secure message informing: Psychiatric consult complete.  Patient has been referred to [facility] unit at [facility].  [facility] is in the process of reviewing patient's.  There are no available beds at this time.  We will inform nursing when bed becomes available.  Patient may also benefit from rehab/detox facility.  Please inform MD only default listed    Yonis Carreon, NP 01/28/2021 3:21 PM

## 2021-01-28 NOTE — Discharge Instructions (Signed)
To help you maintain a sober lifestyle, a substance use disorder treatment program may be beneficial to you.  Contact one of the following providers at your earliest opportunity to ask about enrolling their program:  RESIDENTIAL REHAB PROGRAMS - CHARITABLE:       [facility]      [address], [place] [address]      [phone]       [facility]      [id number] [place] [address]      [phone] II      [address]      [place], [place] [address]      [phone], [place] [address]      [phone]       [facility]      [address] [place], [place] [address]      [phone]  RESIDENTIAL REHAB - MEDICAID OR UNINSURED:       [facility]      [address] [place], [place] [address]      [phone]       [facility]      [address]      [place], [place] [address]      [phone]       [facility] residents only)      [address]      [place], [place] [address]      [phone]       [facility]Re[facility] for [facility] only)      [address]      [place], [place] [address]      [phone]  MEDICAL DETOX AND RESIDENTIAL REHAB - INSURED:       [facility]      [address]      [place], [place] [address]      [phone]       [facility]      [address], [place] [address]      [phone]       [facility]      [address]      [place], [place] [address]      [phone]  OUTPATIENT PROGRAMS - INSURED:       [facility] (private insurance)      [address] [place] [address]      [phone]       [facility] & private insurance)      [phone]      [place], [place] [address]      [phone]  OUTPATIENT  PROGRAMS - MEDICAID OR UNINSURED:       [facility]      [address]      [place], [place] [address]      [phone]      Ask about their Substance Abuse Intensive Outpatient Program.  They also offer psychiatry/medication management and therapy.  New patients are seen in their walk-in clinic.  Walk-in hours are Monday - Thursday from 8:00 am - 11:00 am for psychiatry, and Friday from 1:00 pm - 4:00 pm for therapy.  Walk-in patients are seen on a first come, first served basis, so try to arrive as early as possible for the best chance of being seen the same day.

## 2021-01-28 NOTE — ED Notes (Signed)
Breakfast Orders Placed °

## 2021-01-28 NOTE — ED Provider Notes (Signed)
Emergency Medicine Observation Re-evaluation Note  Richard Moon is a 60 y.o. male, seen on rounds today.  Pt initially presented to the ED for complaints of Suicidal Currently, the patient is resting comfortably, not acutely psychotic.  Physical Exam  BP (!) 145/79   Pulse 63   Temp 98.2 F (36.8 C) (Oral)   Resp 16   Ht 5\' 11"  (1.803 m)   Wt 70 kg   SpO2 96%   BMI 21.52 kg/m  Physical Exam General: NAD, non toxic appearing Cardiac: regular heart rate, no murmur Lungs: not hypoxic, equal chest rise b/l Psych: not acutely psychotic, no hallucinations, not currently suicidal.  ED Course / MDM   I have reviewed the labs performed to date as well as medications administered while in observation.  Recent changes in the last 24 hours include evaluation by TTS which is pending. Overnight observation for safety was previously recommended. No acute changes overnight. No home medications.   Plan  Current plan is for awaiting evaluation by psychiatry, stabilization, would benefit from substance abuse resources.    Richard Moon is not under involuntary commitment.     [name], DO 01/28/21 1729

## 2021-01-28 NOTE — BH Assessment (Addendum)
Disposition:   Per Shuvon Rankin, Patient has been referred to [facility] unit at [facility]  [facility] was in the process of reviewing patient's.  Upon chart review, there were no available beds  earlier today.  [facility] NP, will inform nursing when bed becomes available.  @2148 , Contacted Cody, PA-C to follow up and inquire about bed availability at the [facility]. @2303 , confirmed patient's coverage, which makes him ineligible for the [facility]   Patient may also benefit from rehab/detox facility. @2120 , Contacted Facility Based [facility]ise Centers: [facility] [place] and  [facility] in [place], and their are beds available for patient to participate in substance use treatment/mental health stabilization at both facilities. Confirmed bed availability with [facility] the [place]. Confirmed bed availability with [facility] at the [place] location. Also, faxed a referral to [facility] in [place] to review for their substance abuse program.   @2116 , Disposition Counselor followed up with [facility] Charge Nurse [employer], RN, as directed by the Director, G[place], RN, [facility] will not be moving any patients tonight. Any adult or child/adolescent patient needing a bed at [facility] will need to be reviewed in the morning.   Morning Disposition LCSW to follow up with patient's disposition.

## 2021-01-29 NOTE — Progress Notes (Signed)
CSW provided patient with [facility] and [employer] numbers. Patient stated [place] is too far but he will keep this information if needed

## 2021-01-29 NOTE — Consult Note (Signed)
Follow Up consult  Subjective:   Richard Moon is a 60 y.o. male patient admitted to [facility] ED after presenting with complaints of suicidal ideation and a plan to walk out in front of a car.  Patient also complains of auditory hallucinations.  Tele health machine not working.  Spoke to.name Richard Moon  60 y.o. male  via telephone.  Patient states he is felling better and is ready to go home.  Patient denies suicidal/self-harm/homicidal ideation, psycosis, and paranoia.  Patient reports he is eating/sleeping without any difficulty.  Reports he is tolerating his medication without any adverse reaction.  Patient states that he would use the resources that would be attached to his discharge instructions and he plans on going to AA/NA meetings once discharge.  Patient reports that he will be going to a friend's house to stay.   Disposition: Psychiatrically cleared No evidence of imminent risk to self or others at present.   Patient does not meet criteria for psychiatric inpatient admission. Supportive therapy provided about ongoing stressors. Refer to IOP. Discussed crisis plan, support from social network, calling 911, coming to the Emergency Department, and calling Suicide Hotline.  A secure message sent to patient's nurse Richard Duval, RN informing: Patient has been psychiatrically cleared.  Patient will follow up with resources attached to discharge instructions for community services, outpatient psychiatric services, and substance use services.  Patient also given information on shelters.    Discharge Instructions      To help you maintain a sober lifestyle, a substance use disorder treatment program may be beneficial to you.  Contact one of the following providers at your earliest opportunity to ask about enrolling their program:  RESIDENTIAL REHAB PROGRAMS - CHARITABLE:       [facility]      [address], [place] [address]      [phone]       [facility]  [facility]      [phone] [place] [address]      [phone]      [address]      [place], [place] [address]      [phone], [place] [address]      [phone]       [facility]      [address] [place], [place] [address]      [phone]  RESIDENTIAL REHAB - MEDICAID OR UNINSURED:       [facility]      [address] [place], [place] [address]      [phone]       [facility]      [address]      [place], [place] [address]      [phone]       [facility] residents only)      [address]      [place], [place] [address]      [phone]       [facility] (Re[facility] for [facility] only)      [address]      [place], [place] [address]      [phone]  MEDICAL DETOX AND RESIDENTIAL REHAB - INSURED:       [facility]      [address]      [place], [place] [address]      [phone]       [facility]      [address], [place] [address]      [phone]       [facility]      [address]      [place], [place] [address]      [phone]  OUTPATIENT PROGRAMS - INSURED:       [facility] (private insurance)      [address] [place] [address]      [phone]       [facility] & private insurance)      [phone]      [place], [place] [address]      [phone]  OUTPATIENT PROGRAMS - MEDICAID OR UNINSURED:       [facility]      [address]      [place], [place] [address]      [phone]      Ask about their Substance Abuse Intensive Outpatient Program.  They also offer psychiatry/medication management and therapy.  New patients are seen in their walk-in clinic.  Walk-in hours are Monday - Thursday from 8:00 am -  11:00 am for psychiatry, and Friday from 1:00 pm - 4:00 pm for therapy.  Walk-in patients are seen on a first come, first served basis, so try to arrive as early as possible for the best chance of being seen the same day.      Assunta Found, NP

## 2021-01-29 NOTE — Progress Notes (Signed)
CSW contacted multiple rehabilitation facilities to seek disposition for patient. [facility] has confirmed availability and a fax for review has been submitted. [facility] and [facility] also have availability but require a telephone assessment to be completed prior to review and acceptance. CSW contacted ED SW to coordinate the telephone assessments with the patient in the ED. CSW informed NP regarding updates on possible dispositions.   Damita Dunnings, MSW, LCSW-A  11:12 AM 01/29/2021

## 2021-01-29 NOTE — ED Notes (Addendum)
Pt here voluntarily and, tired of waiting for placement. Messaged Shuvon Rankin, she is talking to the patient now.  Plan is to discharge the patient and patient will follow up with resources that will be provided for him.

## 2021-03-30 ENCOUNTER — Other Ambulatory Visit: Payer: Self-pay

## 2021-03-30 ENCOUNTER — Emergency Department (HOSPITAL_COMMUNITY)
Admission: EM | Admit: 2021-03-30 | Discharge: 2021-04-01 | Disposition: A | Discharge status: Home / Self Care | Payer: 59 | Attending: Emergency Medicine | Admitting: Emergency Medicine

## 2021-03-30 DIAGNOSIS — F332 Major depressive disorder, recurrent severe without psychotic features: Secondary | ICD-10-CM | POA: Diagnosis not present

## 2021-03-30 DIAGNOSIS — Z20822 Contact with and (suspected) exposure to covid-19: Secondary | ICD-10-CM | POA: Insufficient documentation

## 2021-03-30 DIAGNOSIS — F17210 Nicotine dependence, cigarettes, uncomplicated: Secondary | ICD-10-CM | POA: Insufficient documentation

## 2021-03-30 DIAGNOSIS — R45851 Suicidal ideations: Secondary | ICD-10-CM | POA: Diagnosis not present

## 2021-03-30 DIAGNOSIS — F1910 Other psychoactive substance abuse, uncomplicated: Secondary | ICD-10-CM | POA: Diagnosis not present

## 2021-03-30 DIAGNOSIS — Z1330 Encounter for screening examination for mental health and behavioral disorders, unspecified: Secondary | ICD-10-CM | POA: Diagnosis present

## 2021-03-30 DIAGNOSIS — Y900 Blood alcohol level of less than 20 mg/100 ml: Secondary | ICD-10-CM | POA: Insufficient documentation

## 2021-03-30 NOTE — ED Provider Notes (Signed)
Emergency Medicine Provider Triage Evaluation Note  Richard Moon , a 60 y.o. male  was evaluated in triage.  Pt complains of suicidal thoughts. He states that over the weekend he bought "a lot" of crack to try and kill himself but it did not work.  He states that he then went to try and get his sister's gun to "blow brains out."  But he states that his sister stopped him and brought him to the hospital. He last used cocaine today.  I overheard him stating to tach earlier "I am as high as a kite right now."  Review of Systems  Positive: SI Negative: HI, AVH  Physical Exam  BP 140/89   Pulse 98   Temp 98.1 F (36.7 C) (Oral)   Resp 16   Ht 5\' 11"  (1.803 m)   Wt 65.8 kg   SpO2 99%   BMI 20.22 kg/m  Gen:   Awake, no distress   Resp:  Normal effort  MSK:   Moves extremities without difficulty  Other:  Patient is awake and alert.  Speech is not slurred.  Medical Decision Making  Medically screening exam initiated at 11:44 PM.  Appropriate orders placed.  was informed that the remainder of the evaluation will be completed by another provider, this initial triage assessment does not replace that evaluation, and the importance of remaining in the ED until their evaluation is complete.  Note: Portions of this report may have been transcribed using voice recognition software. Every effort was made to ensure accuracy; however, inadvertent computerized transcription errors may be present    [name], PA-C 03/30/21 2346    Mesner, 05/30/21, MD 03/31/21 [phone]

## 2021-03-30 NOTE — ED Triage Notes (Signed)
Pt reports having suicidal thoughts. Pt states he bought a lot of crack and was trying to kill himself but it didn't work. Pt states he then tried to get a gun and "blow his brains out" but he sister stopped him and brought him to the hospital so he could get some help.

## 2021-03-30 NOTE — ED Notes (Signed)
Pt changed into purple scrubs 

## 2021-03-31 LAB — CBC WITH DIFFERENTIAL/PLATELET
Abs Immature Granulocytes: 0.02 10*3/uL (ref 0.00–0.07)
Basophils Absolute: 0 10*3/uL (ref 0.0–0.1)
Basophils Relative: 0 %
Eosinophils Absolute: 0.4 10*3/uL (ref 0.0–0.5)
Eosinophils Relative: 6 %
HCT: 45.9 % (ref 39.0–52.0)
Hemoglobin: 15 g/dL (ref 13.0–17.0)
Immature Granulocytes: 0 %
Lymphocytes Relative: 44 %
Lymphs Abs: 2.4 10*3/uL (ref 0.7–4.0)
MCH: 33.2 pg (ref 26.0–34.0)
MCHC: 32.7 g/dL (ref 30.0–36.0)
MCV: 101.5 fL — ABNORMAL HIGH (ref 80.0–100.0)
Monocytes Absolute: 0.7 10*3/uL (ref 0.1–1.0)
Monocytes Relative: 13 %
Neutro Abs: 2 10*3/uL (ref 1.7–7.7)
Neutrophils Relative %: 37 %
Platelets: 218 10*3/uL (ref 150–400)
RBC: 4.52 MIL/uL (ref 4.22–5.81)
RDW: 13.4 % (ref 11.5–15.5)
WBC: 5.4 10*3/uL (ref 4.0–10.5)
nRBC: 0 % (ref 0.0–0.2)

## 2021-03-31 LAB — COMPREHENSIVE METABOLIC PANEL
ALT: 24 U/L (ref 0–44)
AST: 43 U/L — ABNORMAL HIGH (ref 15–41)
Albumin: 3.4 g/dL — ABNORMAL LOW (ref 3.5–5.0)
Alkaline Phosphatase: 53 U/L (ref 38–126)
Anion gap: 10 (ref 5–15)
BUN: 14 mg/dL (ref 6–20)
CO2: 25 mmol/L (ref 22–32)
Calcium: 8.5 mg/dL — ABNORMAL LOW (ref 8.9–10.3)
Chloride: 107 mmol/L (ref 98–111)
Creatinine, Ser: 1.45 mg/dL — ABNORMAL HIGH (ref 0.61–1.24)
GFR, Estimated: 56 mL/min — ABNORMAL LOW (ref >60)
Glucose, Bld: 96 mg/dL (ref 70–99)
Potassium: 3.8 mmol/L (ref 3.5–5.1)
Sodium: 142 mmol/L (ref 135–145)
Total Bilirubin: 0.7 mg/dL (ref 0.3–1.2)
Total Protein: 5.7 g/dL — ABNORMAL LOW (ref 6.5–8.1)

## 2021-03-31 LAB — RAPID URINE DRUG SCREEN, HOSP PERFORMED
Amphetamines: NOT DETECTED
Barbiturates: NOT DETECTED
Benzodiazepines: NOT DETECTED
Cocaine: POSITIVE — AB
Opiates: NOT DETECTED
Tetrahydrocannabinol: NOT DETECTED

## 2021-03-31 LAB — SALICYLATE LEVEL: Salicylate Lvl: <7 mg/dL — ABNORMAL LOW (ref 7.0–30.0)

## 2021-03-31 LAB — ACETAMINOPHEN LEVEL: Acetaminophen (Tylenol), Serum: <10 ug/mL — ABNORMAL LOW (ref 10–30)

## 2021-03-31 LAB — RESP PANEL BY RT-PCR (FLU A&B, COVID) ARPGX2
Influenza A by PCR: NEGATIVE
Influenza B by PCR: NEGATIVE
SARS Coronavirus 2 by RT PCR: NEGATIVE

## 2021-03-31 LAB — ETHANOL: Alcohol, Ethyl (B): <10 mg/dL (ref <10)
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
## 2021-03-31 NOTE — ED Notes (Signed)
Patient given a Cup of ice.

## 2021-03-31 NOTE — ED Notes (Signed)
This RN to discharge pt per MD order, pt informs this nurse he does not understand why he is being discharged, and does not feel he is ok to go home. This RN notified EDP Pickering, who is now at bedside to assess pt.

## 2021-03-31 NOTE — ED Notes (Signed)
Locker 2 pt belongings

## 2021-03-31 NOTE — ED Provider Notes (Addendum)
  Physical Exam  BP 113/81 (BP Location: Right Arm)   Pulse 77   Temp 98.6 F (37 C) (Oral)   Resp 16   Ht 5\' 11"  (1.803 m)   Wt 65.8 kg   SpO2 98%   BMI 20.22 kg/m   Physical Exam  ED Course/Procedures     Procedures  MDM  Patient's been seen by psychiatry and cleared for discharge       , MD 03/31/21 1925  Went in and discussed with patient.  States that he has been chronically suicidal but this time he actually went and got his sister is gone to do it.  States a couple months ago he had been here and then left.  States he left to get high but now he does not even want to get high.  States he tried to overdose on cocaine just recently and attempt to kill himself hoping his heart would stop.  States this is different than the basic suicidality he has had.  Will discuss with psychiatry again as he states he is high risk and is going to go home and try and kill himself    05/31/21, MD 03/31/21 1936  Discussed with Shuvon Rankin.  With patient stating he is still going to kill himself and actually went to get a gun we will have patient reevaluate in the morning.  Have added back to the TTS list    05/31/21, MD 03/31/21 1942

## 2021-03-31 NOTE — ED Notes (Signed)
Tele psych machine to bedside for assessment.  

## 2021-04-01 ENCOUNTER — Other Ambulatory Visit (HOSPITAL_COMMUNITY)
Admission: EM | Admit: 2021-04-01 | Discharge: 2021-04-07 | Disposition: A | Discharge status: Home / Self Care | Payer: No Payment, Other | Attending: Psychiatry | Admitting: Psychiatry

## 2021-04-01 ENCOUNTER — Other Ambulatory Visit: Payer: Self-pay

## 2021-04-01 ENCOUNTER — Encounter (HOSPITAL_COMMUNITY): Payer: Self-pay | Admitting: Psychiatry

## 2021-04-01 DIAGNOSIS — F332 Major depressive disorder, recurrent severe without psychotic features: Secondary | ICD-10-CM | POA: Insufficient documentation

## 2021-04-01 DIAGNOSIS — F1190 Opioid use, unspecified, uncomplicated: Secondary | ICD-10-CM | POA: Insufficient documentation

## 2021-04-01 DIAGNOSIS — F1994 Other psychoactive substance use, unspecified with psychoactive substance-induced mood disorder: Secondary | ICD-10-CM | POA: Insufficient documentation

## 2021-04-01 DIAGNOSIS — F17210 Nicotine dependence, cigarettes, uncomplicated: Secondary | ICD-10-CM | POA: Insufficient documentation

## 2021-04-01 DIAGNOSIS — Z79899 Other long term (current) drug therapy: Secondary | ICD-10-CM | POA: Insufficient documentation

## 2021-04-01 DIAGNOSIS — F1290 Cannabis use, unspecified, uncomplicated: Secondary | ICD-10-CM | POA: Insufficient documentation

## 2021-04-01 DIAGNOSIS — F1090 Alcohol use, unspecified, uncomplicated: Secondary | ICD-10-CM | POA: Insufficient documentation

## 2021-04-01 DIAGNOSIS — F322 Major depressive disorder, single episode, severe without psychotic features: Secondary | ICD-10-CM

## 2021-04-01 DIAGNOSIS — F1490 Cocaine use, unspecified, uncomplicated: Secondary | ICD-10-CM | POA: Diagnosis not present

## 2021-04-01 MED ORDER — MAGNESIUM HYDROXIDE 400 MG/5ML PO SUSP
30.0000 mL | Freq: Every day | ORAL | Status: DC | PRN
  Administered 2021-04-02: 30 mL via ORAL
  Filled 2021-04-01: qty 30

## 2021-04-01 MED ORDER — ACETAMINOPHEN 325 MG PO TABS
650.0000 mg | ORAL_TABLET | Freq: Four times a day (QID) | ORAL | Status: DC | PRN
  Administered 2021-04-02 – 2021-04-05 (×4): 650 mg via ORAL
  Filled 2021-04-01 (×4): qty 2

## 2021-04-01 MED ORDER — TRAZODONE HCL 50 MG PO TABS
50.0000 mg | ORAL_TABLET | Freq: Every evening | ORAL | Status: DC | PRN
  Administered 2021-04-01 – 2021-04-04 (×3): 50 mg via ORAL
  Filled 2021-04-01 (×4): qty 1

## 2021-04-01 MED ORDER — ALUM & MAG HYDROXIDE-SIMETH 200-200-20 MG/5ML PO SUSP
30.0000 mL | ORAL | Status: DC | PRN
  Administered 2021-04-01 – 2021-04-05 (×6): 30 mL via ORAL
  Filled 2021-04-01 (×6): qty 30

## 2021-04-01 MED ORDER — HYDROXYZINE HCL 25 MG PO TABS
25.0000 mg | ORAL_TABLET | Freq: Three times a day (TID) | ORAL | Status: DC | PRN
  Administered 2021-04-03 – 2021-04-05 (×3): 25 mg via ORAL
  Filled 2021-04-01: qty 1
  Filled 2021-04-01: qty 10
  Filled 2021-04-01 (×2): qty 1

## 2021-04-01 NOTE — ED Notes (Signed)
Pt DC to [facility]

## 2021-04-01 NOTE — ED Notes (Signed)
Report called to [facility].  

## 2021-04-01 NOTE — ED Notes (Addendum)
Pt was asked three or four times if he needed to make contact list due to item been locked up and will not be returned until discharged, pt stated he did not need to write any down.
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
## 2021-04-01 NOTE — ED Notes (Signed)
Pt reporting indigestion "from the mac and cheese" and requesting medication. PRN Maalox given. No signs of acute distress noted. Will continue to monitor for safety.
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
## 2021-04-01 NOTE — ED Notes (Signed)
Pt in room sleeping, respirations even/unlabored, environment check complete/secure will continue to monitor patient for safety.

## 2021-04-01 NOTE — ED Notes (Signed)
Patient is a 60 year old male who was brought to [facility] ED on 10/9 by his sister after he purchased a large amount of cocaine in order to overdose with its use.  When his sister thwarted this effort he had a plan to go get a gun to shoot himself with it.  His sister again interfered with this plan and took him to the ED.  He has been using cocaine daily and drinking ETOH several times a week.  Patient also reports poor sleep and decreased appetite.  He was pleasant but guarded and somewhat paranoid on admission.  He stated that he ,"isn't paranoid, I just don't trust people."  He was sent to [facility] from [facility].  He was shown his room and given a tour of the unit.  He is presently seeking with DR. Laubach.  Will monitor and provide safe supportive environment while he is on unit.

## 2021-04-01 NOTE — Progress Notes (Signed)
Patient has been recommended by this provider for transfer to [facility] for admission to [facility].

## 2021-04-01 NOTE — ED Notes (Signed)
Pt is currently up walking around, respirations even/unlabored, environment check complete/secure, will continue to monitor patient for safety

## 2021-04-02 DIAGNOSIS — F1090 Alcohol use, unspecified, uncomplicated: Secondary | ICD-10-CM | POA: Diagnosis not present

## 2021-04-02 DIAGNOSIS — F1994 Other psychoactive substance use, unspecified with psychoactive substance-induced mood disorder: Secondary | ICD-10-CM | POA: Diagnosis not present

## 2021-04-02 DIAGNOSIS — F332 Major depressive disorder, recurrent severe without psychotic features: Secondary | ICD-10-CM | POA: Diagnosis not present

## 2021-04-02 DIAGNOSIS — F1490 Cocaine use, unspecified, uncomplicated: Secondary | ICD-10-CM | POA: Diagnosis not present

## 2021-04-02 LAB — LIPID PANEL
Cholesterol: 190 mg/dL (ref 0–200)
HDL: 78 mg/dL (ref >40)
LDL Cholesterol: 99 mg/dL (ref 0–99)
Total CHOL/HDL Ratio: 2.4 RATIO
Triglycerides: 66 mg/dL (ref <150)
VLDL: 13 mg/dL (ref 0–40)

## 2021-04-02 LAB — HEMOGLOBIN A1C
Hgb A1c MFr Bld: 5.8 % — ABNORMAL HIGH (ref 4.8–5.6)
Mean Plasma Glucose: 119.76 mg/dL

## 2021-04-02 LAB — TSH: TSH: 0.534 u[IU]/mL (ref 0.350–4.500)

## 2021-04-02 MED ORDER — SERTRALINE HCL 25 MG PO TABS
25.0000 mg | ORAL_TABLET | Freq: Every day | ORAL | Status: DC
  Administered 2021-04-02 – 2021-04-03 (×2): 25 mg via ORAL
  Filled 2021-04-02 (×2): qty 1

## 2021-04-02 NOTE — ED Provider Notes (Signed)
Behavioral Health Progress Note  Date and Time: 04/02/2021 1:37 PM Name: Richard Moon MRN:  [id number]  Subjective:   60 year old man with a history of cocaine use, substance-induced mood disorder, MDD who presented to [facility] ED on 10/9 with suicidal ideations as well as reporting that he had attempted suicide via overdose on cocaine.  Patient also reported prior to presentation that he attempted to grab his sister's gun; however, his sister intervened and recommended that he go to the hospital for assessment.  Patient was transferred from [facility] ED to [facility] ton 10/11 for further treatment. UDS+cocaine  Patient seen and chart reviewed.  Patient has not been a management problem overnight.  Patient interviewed this morning in his room.  He is found laying in bed in no acute distress.  He is agreeable to interview this morning-he is calm, cooperative, and pleasant.  He states that his mood is "alright".  He states that he has been attending group and has found one group about coping skills to be particularly helpful.  He states that he did not sleep well last night and attributes this to acid reflux after consuming macaroni and cheese for dinner.  He denies SI/HI/AVH.  He states that he is interested in attending rehab to get to get help for his substance use treatment.  Patient states that he had attempted to get help for substance use in the past, and that sometimes he feels discouraged since he has relapsed and attended rehab multiple times.  Support and encouragement were provided-discussed that addiction is a chronic illness and that it is not uncommon that people who suffer from addiction will have several relapses.  Discussed starting medication for mood and anxiety.  Per chart review, he had previously been on Zoloft.  Discussed Zoloft with patient and he is amenable to restarting this medication.  Informed patient that he will be notified once the team has heard back from substance use  treatment facility.  Patient verbalized understanding.       Diagnosis:  Final diagnoses:  Substance induced mood disorder (HCC)  MDD (major depressive disorder), severe (HCC)    Total Time spent with patient: 20 minutes  Past Psychiatric History: see H&P Past Medical History: No past medical history on file.  Past Surgical History:  Procedure Laterality Date   HERNIA REPAIR     Family History: No family history on file. Family Psychiatric  History: see H&P Social History:  Social History   Substance and Sexual Activity  Alcohol Use Yes   Comment: Occasionally      Social History   Substance and Sexual Activity  Drug Use Yes   Types: Marijuana, Cocaine, Heroin    Social History   Socioeconomic History   Marital status: Single    Spouse name: Not on file   Number of children: Not on file   Years of education: Not on file   Highest education level: Not on file  Occupational History   Not on file  Tobacco Use   Smoking status: Some Days    Packs/day: 0.25    Years: 20.00    Pack years: 5.00    Types: Cigarettes   Smokeless tobacco: Never  Vaping Use   Vaping Use: Every day  Substance and Sexual Activity   Alcohol use: Yes    Comment: Occasionally    Drug use: Yes    Types: Marijuana, Cocaine, Heroin   Sexual activity: Yes    Birth control/protection: Condom  Other Topics Concern  Not on file  Social History Narrative   Not on file   Social Determinants of Health   Financial Resource Strain: Not on file  Food Insecurity: Not on file  Transportation Needs: Not on file  Physical Activity: Not on file  Stress: Not on file  Social Connections: Not on file   SDOH:  SDOH Screenings   Alcohol Screen: Not on file  Depression (PHQ2-9): Medium Risk   PHQ-2 Score: 14  Financial Resource Strain: Not on file  Food Insecurity: Not on file  Housing: Not on file  Physical Activity: Not on file  Social Connections: Not on file  Stress: Not on file   Tobacco Use: High Risk   Smoking Tobacco Use: Some Days   Smokeless Tobacco Use: Never  Transportation Needs: Not on file   Additional Social History:                         Sleep: Fair  Appetite:  Fair  Current Medications:  Current Facility-Administered Medications  Medication Dose Route Frequency Provider Last Rate Last Admin   acetaminophen (TYLENOL) tablet 650 mg  650 mg Oral Q6H PRN Estella Husk, MD       alum & mag hydroxide-simeth (MAALOX/MYLANTA) 200-200-20 MG/5ML suspension 30 mL  30 mL Oral Q4H PRN Estella Husk, MD   30 mL at 04/01/21 2158   hydrOXYzine (ATARAX/[name]) tablet 25 mg  25 mg Oral TID PRN Estella Husk, MD       magnesium hydroxide (MILK OF MAGNESIA) suspension 30 mL  30 mL Oral Daily PRN Estella Husk, MD       sertraline (ZOLOFT) tablet 25 mg  25 mg Oral Daily Estella Husk, MD   25 mg at 04/02/21 1326   traZODone (DESYREL) tablet 50 mg  50 mg Oral QHS PRN Estella Husk, MD   50 mg at 04/01/21 2133   Current Outpatient Medications  Medication Sig Dispense Refill   aspirin 325 MG tablet Take 325 mg by mouth daily as needed for headache.     ibuprofen (ADVIL) 100 MG tablet Take 400 mg by mouth every 6 (six) hours as needed (headache).      Labs  Lab Results:  Admission on 04/01/2021  Component Date Value Ref Range Status   TSH 04/02/2021 0.534  0.350 - 4.500 uIU/mL Final   Comment: Performed by a 3rd Generation assay with a functional sensitivity of <=0.01 uIU/mL. Performed at [facility], [address], [place], [place] [address]    Hgb A1c MFr Bld 04/02/2021 5.8 (A) 4.8 - 5.6 % Final   Comment: (NOTE) Pre diabetes:          5.7%-6.4%  Diabetes:              >6.4%  Glycemic control for   <7.0% adults with diabetes    Mean Plasma Glucose 04/02/2021 119.76  mg/dL Final   Performed at [facility] Lab, [address] [place], [place] [address]   Cholesterol 04/02/2021 190  0 -  200 mg/dL Final   Triglycerides 09/81/1914 66  <150 mg/dL Final   HDL 78/29/5621 78  >40 mg/dL Final   Total CHOL/HDL Ratio 04/02/2021 2.4  RATIO Final   VLDL 04/02/2021 13  0 - 40 mg/dL Final   LDL Cholesterol 04/02/2021 99  0 - 99 mg/dL Final   Comment:        Total Cholesterol/HDL:CHD Risk Coronary Heart  Disease Risk Table                     Men   Women  1/2 Average Risk   3.4   3.3  Average Risk       5.0   4.4  2 X Average Risk   9.6   7.1  3 X Average Risk  23.4   11.0        Use the calculated Patient Ratio above and the CHD Risk Table to determine the patient's CHD Risk.        ATP III CLASSIFICATION (LDL):  <100     mg/dL   Optimal  161-096  mg/dL   Near or Above                    Optimal  130-159  mg/dL   Borderline  045-409  mg/dL   High  >811     mg/dL   Very High Performed at [facility], [address], [place], [place] [address]   Admission on 03/30/2021, Discharged on 04/01/2021  Component Date Value Ref Range Status   SARS Coronavirus 2 by RT PCR 03/31/2021 NEGATIVE  NEGATIVE Final   Comment: (NOTE) SARS-CoV-2 target nucleic acids are NOT DETECTED.  The SARS-CoV-2 RNA is generally detectable in upper respiratory specimens during the acute phase of infection. The lowest concentration of SARS-CoV-2 viral copies this assay can detect is 138 copies/mL. A negative result does not preclude SARS-Cov-2 infection and should not be used as the sole basis for treatment or other patient management decisions. A negative result may occur with  improper specimen collection/handling, submission of specimen other than nasopharyngeal swab, presence of viral mutation(s) within the areas targeted by this assay, and inadequate number of viral copies(<138 copies/mL). A negative result must be combined with clinical observations, patient history, and epidemiological information. The expected result is Negative.  Fact Sheet for Patients:   BloggerCourse.com  Fact Sheet for Healthcare Providers:  SeriousBroker.it  This test is no                          t yet approved or cleared by the [place] FDA and  has been authorized for detection and/or diagnosis of SARS-CoV-2 by FDA under an Emergency Use Authorization (EUA). This EUA will remain  in effect (meaning this test can be used) for the duration of the COVID-19 declaration under Section 564(b)(1) of the Act, 21 U.S.C.section 360bbb-3(b)(1), unless the authorization is terminated  or revoked sooner.       Influenza A by PCR 03/31/2021 NEGATIVE  NEGATIVE Final   Influenza B by PCR 03/31/2021 NEGATIVE  NEGATIVE Final   Comment: (NOTE) The Xpert Xpress SARS-CoV-2/FLU/RSV plus assay is intended as an aid in the diagnosis of influenza from Nasopharyngeal swab specimens and should not be used as a sole basis for treatment. Nasal washings and aspirates are unacceptable for Xpert Xpress SARS-CoV-2/FLU/RSV testing.  Fact Sheet for Patients: BloggerCourse.com  Fact Sheet for Healthcare Providers: SeriousBroker.it  This test is not yet approved or cleared by the [place] FDA and has been authorized for detection and/or diagnosis of SARS-CoV-2 by FDA under an Emergency Use Authorization (EUA). This EUA will remain in effect (meaning this test can be used) for the duration of the COVID-19 declaration under Section 564(b)(1) of the Act, 21 U.S.C. section 360bbb-3(b)(1), unless the authorization is terminated or revoked.  Performed at  [facility], [address] [place], [place], [place] [address]    Sodium 03/30/2021 142  135 - 145 mmol/L Final   Potassium 03/30/2021 3.8  3.5 - 5.1 mmol/L Final   Chloride 03/30/2021 107  98 - 111 mmol/L Final   CO2 03/30/2021 25  22 - 32 mmol/L Final   Glucose, Bld 03/30/2021 96  70 - 99 mg/dL Final   Glucose reference range  applies only to samples taken after fasting for at least 8 hours.   BUN 03/30/2021 14  6 - 20 mg/dL Final   Creatinine, Ser 03/30/2021 1.45 (A) 0.61 - 1.24 mg/dL Final   Calcium 29/93/7169 8.5 (A) 8.9 - 10.3 mg/dL Final   Total Protein 67/89/3810 5.7 (A) 6.5 - 8.1 g/dL Final   Albumin 17/51/0258 3.4 (A) 3.5 - 5.0 g/dL Final   AST 52/77/8242 43 (A) 15 - 41 U/L Final   ALT 03/30/2021 24  0 - 44 U/L Final   Alkaline Phosphatase 03/30/2021 53  38 - 126 U/L Final   Total Bilirubin 03/30/2021 0.7  0.3 - 1.2 mg/dL Final   GFR, Estimated 03/30/2021 56 (A) >60 mL/min Final   Comment: (NOTE) Calculated using the CKD-EPI Creatinine Equation (2021)    Anion gap 03/30/2021 10  5 - 15 Final   Performed at [facility], [address], [place], [place] [address]   Alcohol, Ethyl (B) 03/30/2021 <10  <10 mg/dL Final   Comment: (NOTE) Lowest detectable limit for serum alcohol is 10 mg/dL.  For medical purposes only. Performed at [facility], [address], [place], [place] [address]    Opiates 03/31/2021 NONE DETECTED  NONE DETECTED Final   Cocaine 03/31/2021 POSITIVE (A) NONE DETECTED Final   Benzodiazepines 03/31/2021 NONE DETECTED  NONE DETECTED Final   Amphetamines 03/31/2021 NONE DETECTED  NONE DETECTED Final   Tetrahydrocannabinol 03/31/2021 NONE DETECTED  NONE DETECTED Final   Barbiturates 03/31/2021 NONE DETECTED  NONE DETECTED Final   Comment: (NOTE) DRUG SCREEN FOR MEDICAL PURPOSES ONLY.  IF CONFIRMATION IS NEEDED FOR ANY PURPOSE, NOTIFY LAB WITHIN 5 DAYS.  LOWEST DETECTABLE LIMITS FOR URINE DRUG SCREEN Drug Class                     Cutoff (ng/mL) Amphetamine and metabolites    1000 Barbiturate and metabolites    200 Benzodiazepine                 200 Tricyclics and metabolites     300 Opiates and metabolites        300 Cocaine and metabolites        300 THC                            50 Performed at [facility], [address], [place],  [place] [address]    WBC 03/30/2021 5.4  4.0 - 10.5 K/uL Final   RBC 03/30/2021 4.52  4.22 - 5.81 MIL/uL Final   Hemoglobin 03/30/2021 15.0  13.0 - 17.0 g/dL Final   HCT 76/19/5093 45.9  39.0 - 52.0 % Final   MCV 03/30/2021 101.5 (A) 80.0 - 100.0 fL Final   MCH 03/30/2021 33.2  26.0 - 34.0 pg Final   MCHC 03/30/2021 32.7  30.0 - 36.0 g/dL Final   RDW 26/71/2458 13.4  11.5 - 15.5 % Final   Platelets 03/30/2021 218  150 - 400 K/uL Final  nRBC 03/30/2021 0.0  0.0 - 0.2 % Final   Neutrophils Relative % 03/30/2021 37  % Final   Neutro Abs 03/30/2021 2.0  1.7 - 7.7 K/uL Final   Lymphocytes Relative 03/30/2021 44  % Final   Lymphs Abs 03/30/2021 2.4  0.7 - 4.0 K/uL Final   Monocytes Relative 03/30/2021 13  % Final   Monocytes Absolute 03/30/2021 0.7  0.1 - 1.0 K/uL Final   Eosinophils Relative 03/30/2021 6  % Final   Eosinophils Absolute 03/30/2021 0.4  0.0 - 0.5 K/uL Final   Basophils Relative 03/30/2021 0  % Final   Basophils Absolute 03/30/2021 0.0  0.0 - 0.1 K/uL Final   Immature Granulocytes 03/30/2021 0  % Final   Abs Immature Granulocytes 03/30/2021 0.02  0.00 - 0.07 K/uL Final   Performed at [facility], [address] [place], [place] [address]   Acetaminophen (Tylenol), Serum 03/30/2021 <10 (A) 10 - 30 ug/mL Final   Comment: (NOTE) Therapeutic concentrations vary significantly. A range of 10-30 ug/mL  may be an effective concentration for many patients. However, some  are best treated at concentrations outside of this range. Acetaminophen concentrations >150 ug/mL at 4 hours after ingestion  and >50 ug/mL at 12 hours after ingestion are often associated with  toxic reactions.  Performed at [facility], [address], [place], [place] [address]    Salicylate Lvl 03/30/2021 <7.0 (A) 7.0 - 30.0 mg/dL Final   Performed at [facility], [address], [place], [place] [address]  Admission on 01/26/2021, Discharged on 01/29/2021  Component Date Value Ref Range  Status   SARS Coronavirus 2 by RT PCR 01/27/2021 NEGATIVE  NEGATIVE Final   Comment: (NOTE) SARS-CoV-2 target nucleic acids are NOT DETECTED.  The SARS-CoV-2 RNA is generally detectable in upper respiratory specimens during the acute phase of infection. The lowest concentration of SARS-CoV-2 viral copies this assay can detect is 138 copies/mL. A negative result does not preclude SARS-Cov-2 infection and should not be used as the sole basis for treatment or other patient management decisions. A negative result may occur with  improper specimen collection/handling, submission of specimen other than nasopharyngeal swab, presence of viral mutation(s) within the areas targeted by this assay, and inadequate number of viral copies(<138 copies/mL). A negative result must be combined with clinical observations, patient history, and epidemiological information. The expected result is Negative.  Fact Sheet for Patients:  BloggerCourse.com  Fact Sheet for Healthcare Providers:  SeriousBroker.it  This test is no                          t yet approved or cleared by the [place] FDA and  has been authorized for detection and/or diagnosis of SARS-CoV-2 by FDA under an Emergency Use Authorization (EUA). This EUA will remain  in effect (meaning this test can be used) for the duration of the COVID-19 declaration under Section 564(b)(1) of the Act, 21 U.S.C.section 360bbb-3(b)(1), unless the authorization is terminated  or revoked sooner.       Influenza A by PCR 01/27/2021 NEGATIVE  NEGATIVE Final   Influenza B by PCR 01/27/2021 NEGATIVE  NEGATIVE Final   Comment: (NOTE) The Xpert Xpress SARS-CoV-2/FLU/RSV plus assay is intended as an aid in the diagnosis of influenza from Nasopharyngeal swab specimens and should not be used as a sole basis for treatment. Nasal washings and aspirates are unacceptable for Xpert Xpress  SARS-CoV-2/FLU/RSV testing.  Fact Sheet for Patients: BloggerCourse.com  Fact Sheet for Healthcare Providers: SeriousBroker.it  This test is not yet approved or cleared by the [place] FDA and has been authorized for detection and/or diagnosis of SARS-CoV-2 by FDA under an Emergency Use Authorization (EUA). This EUA will remain in effect (meaning this test can be used) for the duration of the COVID-19 declaration under Section 564(b)(1) of the Act, 21 U.S.C. section 360bbb-3(b)(1), unless the authorization is terminated or revoked.  Performed at [facility], [address], [place], [place] [address]    Sodium 01/26/2021 139  135 - 145 mmol/L Final   Potassium 01/26/2021 3.7  3.5 - 5.1 mmol/L Final   Chloride 01/26/2021 109  98 - 111 mmol/L Final   CO2 01/26/2021 23  22 - 32 mmol/L Final   Glucose, Bld 01/26/2021 124 (A) 70 - 99 mg/dL Final   Glucose reference range applies only to samples taken after fasting for at least 8 hours.   BUN 01/26/2021 15  6 - 20 mg/dL Final   Creatinine, Ser 01/26/2021 1.49 (A) 0.61 - 1.24 mg/dL Final   Calcium 16/06/930 8.6 (A) 8.9 - 10.3 mg/dL Final   Total Protein 35/57/3220 5.1 (A) 6.5 - 8.1 g/dL Final   Albumin 25/42/7062 3.2 (A) 3.5 - 5.0 g/dL Final   AST 37/62/8315 38  15 - 41 U/L Final   ALT 01/26/2021 22  0 - 44 U/L Final   Alkaline Phosphatase 01/26/2021 41  38 - 126 U/L Final   Total Bilirubin 01/26/2021 0.5  0.3 - 1.2 mg/dL Final   GFR, Estimated 01/26/2021 54 (A) >60 mL/min Final   Comment: (NOTE) Calculated using the CKD-EPI Creatinine Equation (2021)    Anion gap 01/26/2021 7  5 - 15 Final   Performed at [facility], [address], [place], [place] [address]   Alcohol, Ethyl (B) 01/26/2021 <10  <10 mg/dL Final   Comment: (NOTE) Lowest detectable limit for serum alcohol is 10 mg/dL.  For medical purposes only. Performed at [facility], [address] [place], [place] [address]    Opiates 01/27/2021 NONE DETECTED  NONE DETECTED Final   Cocaine 01/27/2021 POSITIVE (A) NONE DETECTED Final   Benzodiazepines 01/27/2021 NONE DETECTED  NONE DETECTED Final   Amphetamines 01/27/2021 NONE DETECTED  NONE DETECTED Final   Tetrahydrocannabinol 01/27/2021 NONE DETECTED  NONE DETECTED Final   Barbiturates 01/27/2021 NONE DETECTED  NONE DETECTED Final   Comment: (NOTE) DRUG SCREEN FOR MEDICAL PURPOSES ONLY.  IF CONFIRMATION IS NEEDED FOR ANY PURPOSE, NOTIFY LAB WITHIN 5 DAYS.  LOWEST DETECTABLE LIMITS FOR URINE DRUG SCREEN Drug Class                     Cutoff (ng/mL) Amphetamine and metabolites    1000 Barbiturate and metabolites    200 Benzodiazepine                 200 Tricyclics and metabolites     300 Opiates and metabolites        300 Cocaine and metabolites        300 THC                            50 Performed at [facility], [address], [place], [place] [address]    WBC 01/26/2021 4.5  4.0 - 10.5 K/uL Final   RBC 01/26/2021 4.17 (A) 4.22 -  5.81 MIL/uL Final   Hemoglobin 01/26/2021 14.2  13.0 - 17.0 g/dL Final   HCT 16/03/9603 42.1  39.0 - 52.0 % Final   MCV 01/26/2021 101.0 (A) 80.0 - 100.0 fL Final   MCH 01/26/2021 34.1 (A) 26.0 - 34.0 pg Final   MCHC 01/26/2021 33.7  30.0 - 36.0 g/dL Final   RDW 54/02/8118 13.5  11.5 - 15.5 % Final   Platelets 01/26/2021 180  150 - 400 K/uL Final   nRBC 01/26/2021 0.0  0.0 - 0.2 % Final   Neutrophils Relative % 01/26/2021 48  % Final   Neutro Abs 01/26/2021 2.1  1.7 - 7.7 K/uL Final   Lymphocytes Relative 01/26/2021 39  % Final   Lymphs Abs 01/26/2021 1.8  0.7 - 4.0 K/uL Final   Monocytes Relative 01/26/2021 8  % Final   Monocytes Absolute 01/26/2021 0.4  0.1 - 1.0 K/uL Final   Eosinophils Relative 01/26/2021 5  % Final   Eosinophils Absolute 01/26/2021 0.2  0.0 - 0.5 K/uL Final   Basophils Relative 01/26/2021 0  % Final   Basophils Absolute 01/26/2021 0.0  0.0 - 0.1 K/uL Final    Immature Granulocytes 01/26/2021 0  % Final   Abs Immature Granulocytes 01/26/2021 0.01  0.00 - 0.07 K/uL Final   Performed at [facility], [address], [place], [place] [address]   Salicylate Lvl 01/26/2021 <7.0 (A) 7.0 - 30.0 mg/dL Final   Performed at [facility], [address], [place], [place] [address]   Acetaminophen (Tylenol), Serum 01/26/2021 <10 (A) 10 - 30 ug/mL Final   Comment: (NOTE) Therapeutic concentrations vary significantly. A range of 10-30 ug/mL  may be an effective concentration for many patients. However, some  are best treated at concentrations outside of this range. Acetaminophen concentrations >150 ug/mL at 4 hours after ingestion  and >50 ug/mL at 12 hours after ingestion are often associated with  toxic reactions.  Performed at [facility], [address] [place], [place] [address]     Blood Alcohol level:  Lab Results  Component Value Date   [facility] <10 03/30/2021   ETH <10 01/26/2021    Metabolic Disorder Labs: Lab Results  Component Value Date   HGBA1C 5.8 (H) 04/02/2021   MPG 119.76 04/02/2021   No results found for: PROLACTIN Lab Results  Component Value Date   CHOL 190 04/02/2021   TRIG 66 04/02/2021   HDL 78 04/02/2021   CHOLHDL 2.4 04/02/2021   VLDL 13 04/02/2021   LDLCALC 99 04/02/2021    Therapeutic Lab Levels: No results found for: LITHIUM No results found for: VALPROATE No components found for:  CBMZ  Physical Findings   AIMS    Flowsheet Row [facility]  AIMS Total Score 0      AUDIT    Flowsheet Row [facility]  Alcohol Use Disorder Identification Test Final Score (AUDIT) 3      PHQ2-9    Flowsheet Row ED from 03/30/2021 in [facility] EMERGENCY DEPARTMENT ED from 05/25/2020 in [facility] EMERGENCY  DEPARTMENT  PHQ-2 Total Score 4 6  PHQ-9 Total Score 14 25      Flowsheet Row ED from 04/01/2021 in [facility] ED from 03/30/2021 in [facility] EMERGENCY DEPARTMENT ED from 01/26/2021 in [facility] EMERGENCY DEPARTMENT  C-SSRS RISK CATEGORY Low Risk High Risk High Risk        Musculoskeletal  Strength & Muscle Tone: within normal limits Gait & Station: normal Patient leans: N/A  Psychiatric Specialty Exam  Presentation  General Appearance: Appropriate for Environment; Casual  Eye Contact:Good  Speech:Clear and Coherent; Normal Rate  Speech Volume:Normal  Handedness:Right   Mood and Affect  Mood:Euthymic  Affect:Appropriate; Congruent   Thought Process  Thought Processes:Coherent; Linear; Goal Directed  Descriptions of Associations:Intact  Orientation:Full (Time, Place and Person)  Thought Content:WDL; Logical  Diagnosis of Schizophrenia or Schizoaffective disorder in past: No  Duration of Psychotic Symptoms: Less than six months   Hallucinations:Hallucinations: None  Ideas of Reference:None  Suicidal Thoughts:Suicidal Thoughts: No SI Active Intent and/or Plan: Without Intent; Without Plan  Homicidal Thoughts:Homicidal Thoughts: No   Sensorium  Memory:Immediate Good; Recent Good; Remote Good  Judgment:Fair  Insight:Fair   Executive Functions  Concentration:Good  Attention Span:Good  Recall:Good  Fund of Knowledge:Good  Language:Good   Psychomotor Activity  Psychomotor Activity:Psychomotor Activity: Normal   Assets  Assets:Communication Skills; Desire for Improvement; Resilience; Social Support   Sleep  Sleep:Sleep: Fair   Nutritional Assessment (For OBS and [facility] admissions only) Has the patient had a weight loss or gain of 10 pounds or more in the last 3 months?: -- (patient is unsure) Has the patient had a decrease in food intake/or appetite?: Yes Does the patient  have dental problems?: No Does the patient have eating habits or behaviors that may be indicators of an eating disorder including binging or inducing vomiting?: No Has the patient recently lost weight without trying?: 2.0 Has the patient been eating poorly because of a decreased appetite?: 1 (related to drug use) Malnutrition Screening Tool Score: 3 Nutritional Assessment Referrals: Refer to Primary Care Provider    Physical Exam  Physical Exam Constitutional:      Appearance: Normal appearance. He is normal weight.  HENT:     Head: Normocephalic and atraumatic.  Eyes:     Extraocular Movements: Extraocular movements intact.  Pulmonary:     Effort: Pulmonary effort is normal.  Neurological:     General: No focal deficit present.     Mental Status: He is alert and oriented to person, place, and time.  Psychiatric:        Attention and Perception: Attention and perception normal.        Speech: Speech normal.        Behavior: Behavior normal. Behavior is cooperative.        Thought Content: Thought content normal.   Review of Systems  Constitutional:  Negative for chills and fever.  HENT:  Negative for hearing loss.   Eyes:  Negative for discharge and redness.  Respiratory:  Negative for cough.   Cardiovascular:  Negative for chest pain.  Gastrointestinal:  Negative for abdominal pain.  Musculoskeletal:  Negative for myalgias.       +hand cramps   Neurological:  Negative for headaches.  Blood pressure 130/80, pulse 67, temperature 99 F (37.2 C), temperature source Oral, resp. rate 18, SpO2 97 %. There is no height or weight on file to calculate BMI.  Treatment Plan Summary: 60 year old man with a history of cocaine use, substance-induced mood disorder, MDD who presented to [facility] with suicidal ideations as well as reporting that he had attempted suicide via overdose on cocaine.  Patient also reported prior to presentation that he attempted to grab his sister's gun;  however, his sister intervened and  recommended that he go to the hospital for assessment.  Patient was transferred from [facility], ED to [facility] this afternoon for further treatment. Patient agreeable to initiating zoloft today and continues to express interest  in substance use treatment; improvement in SI today.   MDD SIMD -start zoloft 25 mg today; consider titration if clinically appropriate/indicated and patient agreeable -could conisder seroquel -previously prescribed and could assist with cocaine use/cravings   Cocaine use disorder -UDS+cocaine -pt expresses interest in substance use treatment-will consult with SW for assistance   Healthcare maintenance - TSH 0.534 (wnl) -a1c 5.8 (mildly elevated -lipid panel wnl   Dispo: ongoing. likely residential substance use treatment; will consult with SW for assistance  Estella Husk, MD 04/02/2021 1:37 PM

## 2021-04-02 NOTE — ED Notes (Signed)
Pt sleeping in no acute distress. RR even and unlabored. Safety maintained. 

## 2021-04-02 NOTE — Clinical Social Work Psych Note (Signed)
CSW Note   CSW met with patient for introduction and to begin discussions regarding discharge planning.   Richard Moon shared that he has struggled with cocaine use for the last 14 years and struggles "living my life like this". Richard Moon expressed to CSW that he was ready to seek professional help for his addictions issues.   Richard Moon endorsed smoking $50-$100 worth of cocaine on a daily basis. He also endorsed occassional ETOH use, stating he drinks 1-2x a week.   Richard Moon reports he is interested in being referred to a facility for residential substance abuse treatment. He also shared that he is currently homeless due to using all of his income to supply his drug habit.   Richard Moon denied having any SI, HI, or AVH at this time.   CSW will continue to follow for a possible treatment bed.     Radonna Ricker, MSW, LCSW Clinical Education officer, museum ([place]) [facility]

## 2021-04-02 NOTE — Clinical Social Work Psych Note (Signed)
Self-Sabotaging Behaviors  Self-Sabotaging Behaviors & Self Awareness   Date: 04/02/21  Type of Therapy and Topic:   Participation Level: Active   Objective: In this group, patients will learn how to identify obstacles, self-sabotaging and enabling behaviors, as well as: what are they, why do we do them and what needs these behaviors meet. Discuss unhealthy relationships and how to have positive healthy boundaries with those that sabotage and enable. Explore aspects of self-sabotage and enabling in yourself and how to limit these self-destructive behaviors in everyday life.  Therapeutic Goals:  Patient will identify one obstacle that relates to self-sabotage and enabling behaviors Patient will identify one personal self-sabotaging or enabling behavior they did prior to admission Patient will state a plan to change the above identified behavior Patient will demonstrate ability to communicate their needs through discussion and/or role play.   Summary of Patient's Progress:   Richard Moon  was engaged and participated throughout the group session. He was insightful and contributed to the group's discussion.  He also completed the attached worksheets that challenged patients to identify the therapeutic goals and objective listed above.

## 2021-04-02 NOTE — ED Notes (Signed)
Pt's blood has been drawn for the following tests (Hemoglobin A1C, Lipid panel, and TSH), specimen has been placed in the lab and [facility] [employer] has been called for pickeup.

## 2021-04-02 NOTE — ED Notes (Signed)
Patient came to the medication window while medication was being dispensed to there other patients and asked if he could have trazodone to help him sleep, tylenol for some mild pain he was having and milk of magnesia for the gas he had. This nurse advised the patient that he did have all of those medications available in his MAR. PRN medications were given to patient.

## 2021-04-02 NOTE — ED Notes (Signed)
Patient A&Ox4. Denies intent to harm self/others when asked. Denies A/VH. Patient denies any physical complaints when asked. Support and encouragement provided. Routine safety checks conducted according to facility protocol. Encouraged patient to notify staff if thoughts of harm toward self or others arise. Patient verbalize understanding and agreement. Will continue to monitor for safety.    

## 2021-04-02 NOTE — ED Notes (Signed)
Pt denies concerns at present. Received new medication, Zofoft, without difficulty. Reviewed SE to monitor for. Informed pt to notify staff with any needs or concerns. Safety maintained.

## 2021-04-02 NOTE — ED Notes (Signed)
Pt in bed sleep, respirations are even/unlabored, environment check complete/secure, will continue to monitor patient for safety.

## 2021-04-02 NOTE — ED Notes (Signed)
Pt sleeping, respirations even/unlabored, environment check complete/secure, will continue to monitor patient for safety 

## 2021-04-02 NOTE — Group Note (Signed)
Group Topic: Understanding Self  Group Date: 04/02/2021 Start Time: 1000 End Time: 1030 Facilitators: Alexandria Lodge D, NT  Department: [facility]  Number of Participants: 5  Group Focus: acceptance, affirmation, feeling awareness/expression, personal responsibility, self-awareness, and self-esteem Treatment Modality:  Behavior Modification Therapy, Cognitive Behavioral Therapy, Psychodynamic Psychotherapy, and Spiritual Interventions utilized were clarification, group exercise, and support Purpose: enhance coping skills, express feelings, improve communication skills, increase insight, regain self-worth, reinforce self-care, and trigger / craving management  Name: Orbin Mayeux Date of Birth: 05-31-1961  MR: [id number]    Level of Participation: minimal Quality of Participation: engaged, offered feedback, and quiet Interactions with others: gave feedback Mood/Affect: appropriate and positive Triggers (if applicable): N/A Cognition: concrete Progress: Moderate Response: I just have to keep on loving myself and being positive.  Plan: patient will be encouraged to jounal and learn positive affirmation.   Patients Problems:  Patient Active Problem List   Diagnosis Date Noted   Substance induced mood disorder (HCC) 04/01/2021   Polysubstance abuse (HCC) 01/28/2021   Suicidal ideations 01/28/2021   Suicidal ideation    Cocaine-induced mood disorder (HCC)    Cocaine dependence with cocaine-induced mood disorder (HCC)    MDD (major depressive disorder), severe (HCC) 03/12/2018

## 2021-04-03 DIAGNOSIS — F1490 Cocaine use, unspecified, uncomplicated: Secondary | ICD-10-CM | POA: Diagnosis not present

## 2021-04-03 DIAGNOSIS — F1994 Other psychoactive substance use, unspecified with psychoactive substance-induced mood disorder: Secondary | ICD-10-CM | POA: Diagnosis not present

## 2021-04-03 DIAGNOSIS — F1090 Alcohol use, unspecified, uncomplicated: Secondary | ICD-10-CM | POA: Diagnosis not present

## 2021-04-03 DIAGNOSIS — F332 Major depressive disorder, recurrent severe without psychotic features: Secondary | ICD-10-CM | POA: Diagnosis not present

## 2021-04-03 MED ORDER — SERTRALINE HCL 50 MG PO TABS
50.0000 mg | ORAL_TABLET | Freq: Every day | ORAL | Status: DC
  Administered 2021-04-04 – 2021-04-07 (×4): 50 mg via ORAL
  Filled 2021-04-03 (×3): qty 1
  Filled 2021-04-03: qty 7
  Filled 2021-04-03: qty 1

## 2021-04-03 NOTE — ED Notes (Signed)
Patient is presently asleep resting comfortably in bed.  No complaints or distress at present.  Will monitor and provide safe supportive environment.

## 2021-04-03 NOTE — Group Note (Signed)
Group Topic: Overcoming Obstacles  Group Date: 04/03/2021 Start Time: 1000 End Time: 1045 Facilitators: Doyne Keel E  Department: [facility]  Number of Participants: 4  Group Focus: anxiety, coping skills, depression, and feeling awareness/expression Treatment Modality:  Individual Therapy Interventions utilized were mental fitness and problem solving Purpose: enhance coping skills and express feelings  Name: Richard Moon Date of Birth: [birth year]  MR: [id number]    Level of Participation: active Quality of Participation: cooperative Interactions with others: gave feedback Mood/Affect: appropriate Triggers (if applicable): n/a Cognition: coherent/clear Progress: Minimal Response: n/a Plan: follow-up needed  Patients Problems:  Patient Active Problem List   Diagnosis Date Noted   Substance induced mood disorder (HCC) 04/01/2021   Polysubstance abuse (HCC) 01/28/2021   Suicidal ideations 01/28/2021   Suicidal ideation    Cocaine-induced mood disorder (HCC)    Cocaine dependence with cocaine-induced mood disorder (HCC)    MDD (major depressive disorder), severe (HCC) 03/12/2018

## 2021-04-03 NOTE — Clinical Social Work Psych Note (Signed)
CSW Update  CSW met with Nils to provide updates regarding the search for residential treatment beds.   CSW informed Anand of the following updates:   [name] - out of network [facility] -  no beds available currently; two week wait list  [facility] - no beds available currently; four week wait list  [facility]) - no answer. CSW has left two voicemails requesting a call back.  [facility]- out of network  Sneijder is currently homeless, and reports he does not have any income at this time. Earlier this week, Olsen reported that he lost his room at a local boarding home; this was due to not being able to afford room/board due to spending all of his finances on cocaine.   CSW shared with Jorden, that it is possible that he will not be transferred to a facility, directly from the [facility], so it was important for he and CSW to begin developing a "Plan B". Ricco expressed understanding and was agreeable.   CSW informed Lauren, that a referral would be sent to [place] for possible review. CSW will also begin to explore possible transitional living and boarding/rooming housing resources for possible alternatives.   Darek expressed understanding and thanked CSW.    CSW will continue to follow  Radonna Ricker, MSW, LCSW Clinical Social Worker ([place]) [facility]

## 2021-04-03 NOTE — Progress Notes (Signed)
Patient asked for and was given 650mg  tylenol for pain # 6 and 25mg  vistaril for anxiety. Awaiting result of medication.

## 2021-04-03 NOTE — ED Notes (Signed)
Environment secure, will continue to monitor patient. 

## 2021-04-03 NOTE — ED Provider Notes (Signed)
Behavioral Health Progress Note  Date and Time: 04/03/2021 3:17 PM Name: Richard Moon MRN:  [id number]  Subjective:   60 year old man with a history of cocaine use, substance-induced mood disorder, MDD who presented to [facility] ED on 10/9 with suicidal ideations as well as reporting that he had attempted suicide via overdose on cocaine.  Patient also reported prior to presentation that he attempted to grab his sister's gun; however, his sister intervened and recommended that he go to the hospital for assessment.  Patient was transferred from [facility], ED to [facility] ton 10/11 for further treatment. UDS+cocaine  Patient seen and chart reviewed.  He has been medication compliant, has been attending groups, and has been appropriate with peers and staff.  Patient interviewed in his room this morning, he is found lying in his bed in no acute distress.  He reports having a headache this morning but denies other physical complaints.  He states that he slept "alright.  His mood a 3 out of 10 (10 being the best).  He denies SI/HI/AVH.  He reports some anxiety.  His appetite is good.  He states that the groups are going well.  Discussed increasing his Zoloft which can help control anxiety and help his mood-patient is agreeable to increasing to 50 mg.  Discussed with patient that team is working on finding him substance use treatment facility-patient verbalizes understanding.       Diagnosis:  Final diagnoses:  Substance induced mood disorder (HCC)  MDD (major depressive disorder), severe (HCC)    Total Time spent with patient: 20 minutes  Past Psychiatric History: see H&P Past Medical History: No past medical history on file.  Past Surgical History:  Procedure Laterality Date   HERNIA REPAIR     Family History: No family history on file. Family Psychiatric  History: see H&P Social History:  Social History   Substance and Sexual Activity  Alcohol Use Yes   Comment: Occasionally       Social History   Substance and Sexual Activity  Drug Use Yes   Types: Marijuana, Cocaine, Heroin    Social History   Socioeconomic History   Marital status: Single    Spouse name: Not on file   Number of children: Not on file   Years of education: Not on file   Highest education level: Not on file  Occupational History   Not on file  Tobacco Use   Smoking status: Some Days    Packs/day: 0.25    Years: 20.00    Pack years: 5.00    Types: Cigarettes   Smokeless tobacco: Never  Vaping Use   Vaping Use: Every day  Substance and Sexual Activity   Alcohol use: Yes    Comment: Occasionally    Drug use: Yes    Types: Marijuana, Cocaine, Heroin   Sexual activity: Yes    Birth control/protection: Condom  Other Topics Concern   Not on file  Social History Narrative   Not on file   Social Determinants of Health   Financial Resource Strain: Not on file  Food Insecurity: Not on file  Transportation Needs: Not on file  Physical Activity: Not on file  Stress: Not on file  Social Connections: Not on file   SDOH:  SDOH Screenings   Alcohol Screen: Not on file  Depression (PHQ2-9): Medium Risk   PHQ-2 Score: 14  Financial Resource Strain: Not on file  Food Insecurity: Not on file  Housing: Not on file  Physical Activity:  Not on file  Social Connections: Not on file  Stress: Not on file  Tobacco Use: High Risk   Smoking Tobacco Use: Some Days   Smokeless Tobacco Use: Never  Transportation Needs: Not on file   Additional Social History:                         Sleep: Fair  Appetite:  Fair  Current Medications:  Current Facility-Administered Medications  Medication Dose Route Frequency Provider Last Rate Last Admin   acetaminophen (TYLENOL) tablet 650 mg  650 mg Oral Q6H PRN Estella Husk, MD   650 mg at 04/03/21 1409   alum & mag hydroxide-simeth (MAALOX/MYLANTA) 200-200-20 MG/5ML suspension 30 mL  30 mL Oral Q4H PRN Estella Husk, MD    30 mL at 04/02/21 1623   hydrOXYzine (ATARAX/[name]) tablet 25 mg  25 mg Oral TID PRN Estella Husk, MD   25 mg at 04/03/21 1409   magnesium hydroxide (MILK OF MAGNESIA) suspension 30 mL  30 mL Oral Daily PRN Estella Husk, MD   30 mL at 04/02/21 2129   [START ON 04/04/2021] sertraline (ZOLOFT) tablet 50 mg  50 mg Oral Daily Estella Husk, MD       traZODone (DESYREL) tablet 50 mg  50 mg Oral QHS PRN Estella Husk, MD   50 mg at 04/02/21 2127   Current Outpatient Medications  Medication Sig Dispense Refill   aspirin 325 MG tablet Take 325 mg by mouth daily as needed for headache.     ibuprofen (ADVIL) 100 MG tablet Take 400 mg by mouth every 6 (six) hours as needed (headache).      Labs  Lab Results:  Admission on 04/01/2021  Component Date Value Ref Range Status   TSH 04/02/2021 0.534  0.350 - 4.500 uIU/mL Final   Comment: Performed by a 3rd Generation assay with a functional sensitivity of <=0.01 uIU/mL. Performed at [facility] Lab, [address], [place], [place] [address]    Hgb A1c MFr Bld 04/02/2021 5.8 (A) 4.8 - 5.6 % Final   Comment: (NOTE) Pre diabetes:          5.7%-6.4%  Diabetes:              >6.4%  Glycemic control for   <7.0% adults with diabetes    Mean Plasma Glucose 04/02/2021 119.76  mg/dL Final   Performed at [facility] Lab, [address] [place], [place] [address]   Cholesterol 04/02/2021 190  0 - 200 mg/dL Final   Triglycerides 28/41/3244 66  <150 mg/dL Final   HDL 06/24/7251 78  >40 mg/dL Final   Total CHOL/HDL Ratio 04/02/2021 2.4  RATIO Final   VLDL 04/02/2021 13  0 - 40 mg/dL Final   LDL Cholesterol 04/02/2021 99  0 - 99 mg/dL Final   Comment:        Total Cholesterol/HDL:CHD Risk Coronary Heart Disease Risk Table                     Men   Women  1/2 Average Risk   3.4   3.3  Average Risk       5.0   4.4  2 X Average Risk   9.6   7.1  3 X Average Risk  23.4   11.0        Use the calculated Patient  Ratio above and the CHD Risk Table to  determine the patient's CHD Risk.        ATP III CLASSIFICATION (LDL):  <100     mg/dL   Optimal  263-785  mg/dL   Near or Above                    Optimal  130-159  mg/dL   Borderline  885-027  mg/dL   High  >741     mg/dL   Very High Performed at [facility] Lab, [address], [place], [place] [address]   Admission on 03/30/2021, Discharged on 04/01/2021  Component Date Value Ref Range Status   SARS Coronavirus 2 by RT PCR 03/31/2021 NEGATIVE  NEGATIVE Final   Comment: (NOTE) SARS-CoV-2 target nucleic acids are NOT DETECTED.  The SARS-CoV-2 RNA is generally detectable in upper respiratory specimens during the acute phase of infection. The lowest concentration of SARS-CoV-2 viral copies this assay can detect is 138 copies/mL. A negative result does not preclude SARS-Cov-2 infection and should not be used as the sole basis for treatment or other patient management decisions. A negative result may occur with  improper specimen collection/handling, submission of specimen other than nasopharyngeal swab, presence of viral mutation(s) within the areas targeted by this assay, and inadequate number of viral copies(<138 copies/mL). A negative result must be combined with clinical observations, patient history, and epidemiological information. The expected result is Negative.  Fact Sheet for Patients:  BloggerCourse.com  Fact Sheet for Healthcare Providers:  SeriousBroker.it  This test is no                          t yet approved or cleared by the [place] FDA and  has been authorized for detection and/or diagnosis of SARS-CoV-2 by FDA under an Emergency Use Authorization (EUA). This EUA will remain  in effect (meaning this test can be used) for the duration of the COVID-19 declaration under Section 564(b)(1) of the Act, 21 U.S.C.section 360bbb-3(b)(1), unless the authorization is  terminated  or revoked sooner.       Influenza A by PCR 03/31/2021 NEGATIVE  NEGATIVE Final   Influenza B by PCR 03/31/2021 NEGATIVE  NEGATIVE Final   Comment: (NOTE) The Xpert Xpress SARS-CoV-2/FLU/RSV plus assay is intended as an aid in the diagnosis of influenza from Nasopharyngeal swab specimens and should not be used as a sole basis for treatment. Nasal washings and aspirates are unacceptable for Xpert Xpress SARS-CoV-2/FLU/RSV testing.  Fact Sheet for Patients: BloggerCourse.com  Fact Sheet for Healthcare Providers: SeriousBroker.it  This test is not yet approved or cleared by the [place] FDA and has been authorized for detection and/or diagnosis of SARS-CoV-2 by FDA under an Emergency Use Authorization (EUA). This EUA will remain in effect (meaning this test can be used) for the duration of the COVID-19 declaration under Section 564(b)(1) of the Act, 21 U.S.C. section 360bbb-3(b)(1), unless the authorization is terminated or revoked.  Performed at [facility] Lab, [address] [place], [place] [address]    Sodium 03/30/2021 142  135 - 145 mmol/L Final   Potassium 03/30/2021 3.8  3.5 - 5.1 mmol/L Final   Chloride 03/30/2021 107  98 - 111 mmol/L Final   CO2 03/30/2021 25  22 - 32 mmol/L Final   Glucose, Bld 03/30/2021 96  70 - 99 mg/dL Final   Glucose reference range applies only to samples taken after fasting for at least 8 hours.   BUN 03/30/2021 14  6 - 20 mg/dL Final   Creatinine, Ser 03/30/2021 1.45 (A) 0.61 - 1.24 mg/dL Final   Calcium 28/36/6294 8.5 (A) 8.9 - 10.3 mg/dL Final   Total Protein 76/54/6503 5.7 (A) 6.5 - 8.1 g/dL Final   Albumin 54/65/6812 3.4 (A) 3.5 - 5.0 g/dL Final   AST 75/17/0017 43 (A) 15 - 41 U/L Final   ALT 03/30/2021 24  0 - 44 U/L Final   Alkaline Phosphatase 03/30/2021 53  38 - 126 U/L Final   Total Bilirubin 03/30/2021 0.7  0.3 - 1.2 mg/dL Final   GFR, Estimated 03/30/2021 56  (A) >60 mL/min Final   Comment: (NOTE) Calculated using the CKD-EPI Creatinine Equation (2021)    Anion gap 03/30/2021 10  5 - 15 Final   Performed at [facility] Lab, [address], [place], [place] [address]   Alcohol, Ethyl (B) 03/30/2021 <10  <10 mg/dL Final   Comment: (NOTE) Lowest detectable limit for serum alcohol is 10 mg/dL.  For medical purposes only. Performed at [facility], [address] [place], [place] [address]    Opiates 03/31/2021 NONE DETECTED  NONE DETECTED Final   Cocaine 03/31/2021 POSITIVE (A) NONE DETECTED Final   Benzodiazepines 03/31/2021 NONE DETECTED  NONE DETECTED Final   Amphetamines 03/31/2021 NONE DETECTED  NONE DETECTED Final   Tetrahydrocannabinol 03/31/2021 NONE DETECTED  NONE DETECTED Final   Barbiturates 03/31/2021 NONE DETECTED  NONE DETECTED Final   Comment: (NOTE) DRUG SCREEN FOR MEDICAL PURPOSES ONLY.  IF CONFIRMATION IS NEEDED FOR ANY PURPOSE, NOTIFY LAB WITHIN 5 DAYS.  LOWEST DETECTABLE LIMITS FOR URINE DRUG SCREEN Drug Class                     Cutoff (ng/mL) Amphetamine and metabolites    1000 Barbiturate and metabolites    200 Benzodiazepine                 200 Tricyclics and metabolites     300 Opiates and metabolites        300 Cocaine and metabolites        300 THC                            50 Performed at [facility] Lab, [address], [place], [place] [address]    WBC 03/30/2021 5.4  4.0 - 10.5 K/uL Final   RBC 03/30/2021 4.52  4.22 - 5.81 MIL/uL Final   Hemoglobin 03/30/2021 15.0  13.0 - 17.0 g/dL Final   HCT 65/99/3570 45.9  39.0 - 52.0 % Final   MCV 03/30/2021 101.5 (A) 80.0 - 100.0 fL Final   MCH 03/30/2021 33.2  26.0 - 34.0 pg Final   MCHC 03/30/2021 32.7  30.0 - 36.0 g/dL Final   RDW 17/79/3903 13.4  11.5 - 15.5 % Final   Platelets 03/30/2021 218  150 - 400 K/uL Final   nRBC 03/30/2021 0.0  0.0 - 0.2 % Final   Neutrophils Relative % 03/30/2021 37  % Final   Neutro Abs 03/30/2021 2.0  1.7 -  7.7 K/uL Final   Lymphocytes Relative 03/30/2021 44  % Final   Lymphs Abs 03/30/2021 2.4  0.7 - 4.0 K/uL Final   Monocytes Relative 03/30/2021 13  % Final   Monocytes Absolute 03/30/2021 0.7  0.1 - 1.0 K/uL Final   Eosinophils Relative 03/30/2021 6  % Final   Eosinophils Absolute 03/30/2021 0.4  0.0 -  0.5 K/uL Final   Basophils Relative 03/30/2021 0  % Final   Basophils Absolute 03/30/2021 0.0  0.0 - 0.1 K/uL Final   Immature Granulocytes 03/30/2021 0  % Final   Abs Immature Granulocytes 03/30/2021 0.02  0.00 - 0.07 K/uL Final   Performed at [facility] Lab, [address] [place], [place] [address]   Acetaminophen (Tylenol), Serum 03/30/2021 <10 (A) 10 - 30 ug/mL Final   Comment: (NOTE) Therapeutic concentrations vary significantly. A range of 10-30 ug/mL  may be an effective concentration for many patients. However, some  are best treated at concentrations outside of this range. Acetaminophen concentrations >150 ug/mL at 4 hours after ingestion  and >50 ug/mL at 12 hours after ingestion are often associated with  toxic reactions.  Performed at [facility], [address] [place], [place] [address]    Salicylate Lvl 03/30/2021 <7.0 (A) 7.0 - 30.0 mg/dL Final   Performed at [facility], [address] [place], [place] [address]  Admission on 01/26/2021, Discharged on 01/29/2021  Component Date Value Ref Range Status   SARS Coronavirus 2 by RT PCR 01/27/2021 NEGATIVE  NEGATIVE Final   Comment: (NOTE) SARS-CoV-2 target nucleic acids are NOT DETECTED.  The SARS-CoV-2 RNA is generally detectable in upper respiratory specimens during the acute phase of infection. The lowest concentration of SARS-CoV-2 viral copies this assay can detect is 138 copies/mL. A negative result does not preclude SARS-Cov-2 infection and should not be used as the sole basis for treatment or other patient management decisions. A negative result may occur with  improper specimen  collection/handling, submission of specimen other than nasopharyngeal swab, presence of viral mutation(s) within the areas targeted by this assay, and inadequate number of viral copies(<138 copies/mL). A negative result must be combined with clinical observations, patient history, and epidemiological information. The expected result is Negative.  Fact Sheet for Patients:  BloggerCourse.com  Fact Sheet for Healthcare Providers:  SeriousBroker.it  This test is no                          t yet approved or cleared by the [place] FDA and  has been authorized for detection and/or diagnosis of SARS-CoV-2 by FDA under an Emergency Use Authorization (EUA). This EUA will remain  in effect (meaning this test can be used) for the duration of the COVID-19 declaration under Section 564(b)(1) of the Act, 21 U.S.C.section 360bbb-3(b)(1), unless the authorization is terminated  or revoked sooner.       Influenza A by PCR 01/27/2021 NEGATIVE  NEGATIVE Final   Influenza B by PCR 01/27/2021 NEGATIVE  NEGATIVE Final   Comment: (NOTE) The Xpert Xpress SARS-CoV-2/FLU/RSV plus assay is intended as an aid in the diagnosis of influenza from Nasopharyngeal swab specimens and should not be used as a sole basis for treatment. Nasal washings and aspirates are unacceptable for Xpert Xpress SARS-CoV-2/FLU/RSV testing.  Fact Sheet for Patients: BloggerCourse.com  Fact Sheet for Healthcare Providers: SeriousBroker.it  This test is not yet approved or cleared by the [place] FDA and has been authorized for detection and/or diagnosis of SARS-CoV-2 by FDA under an Emergency Use Authorization (EUA). This EUA will remain in effect (meaning this test can be used) for the duration of the COVID-19 declaration under Section 564(b)(1) of the Act, 21 U.S.C. section 360bbb-3(b)(1), unless the authorization is  terminated or revoked.  Performed at [facility], [address]  [place], [place] [address]    Sodium 01/26/2021 139  135 - 145 mmol/L Final   Potassium 01/26/2021 3.7  3.5 - 5.1 mmol/L Final   Chloride 01/26/2021 109  98 - 111 mmol/L Final   CO2 01/26/2021 23  22 - 32 mmol/L Final   Glucose, Bld 01/26/2021 124 (A) 70 - 99 mg/dL Final   Glucose reference range applies only to samples taken after fasting for at least 8 hours.   BUN 01/26/2021 15  6 - 20 mg/dL Final   Creatinine, Ser 01/26/2021 1.49 (A) 0.61 - 1.24 mg/dL Final   Calcium 86/57/8469 8.6 (A) 8.9 - 10.3 mg/dL Final   Total Protein 62/95/2841 5.1 (A) 6.5 - 8.1 g/dL Final   Albumin 32/44/0102 3.2 (A) 3.5 - 5.0 g/dL Final   AST 72/53/6644 38  15 - 41 U/L Final   ALT 01/26/2021 22  0 - 44 U/L Final   Alkaline Phosphatase 01/26/2021 41  38 - 126 U/L Final   Total Bilirubin 01/26/2021 0.5  0.3 - 1.2 mg/dL Final   GFR, Estimated 01/26/2021 54 (A) >60 mL/min Final   Comment: (NOTE) Calculated using the CKD-EPI Creatinine Equation (2021)    Anion gap 01/26/2021 7  5 - 15 Final   Performed at [facility] Lab, [address] [place], [place] [address]   Alcohol, Ethyl (B) 01/26/2021 <10  <10 mg/dL Final   Comment: (NOTE) Lowest detectable limit for serum alcohol is 10 mg/dL.  For medical purposes only. Performed at [facility], [address], [place], [place] [address]    Opiates 01/27/2021 NONE DETECTED  NONE DETECTED Final   Cocaine 01/27/2021 POSITIVE (A) NONE DETECTED Final   Benzodiazepines 01/27/2021 NONE DETECTED  NONE DETECTED Final   Amphetamines 01/27/2021 NONE DETECTED  NONE DETECTED Final   Tetrahydrocannabinol 01/27/2021 NONE DETECTED  NONE DETECTED Final   Barbiturates 01/27/2021 NONE DETECTED  NONE DETECTED Final   Comment: (NOTE) DRUG SCREEN FOR MEDICAL PURPOSES ONLY.  IF CONFIRMATION IS NEEDED FOR ANY PURPOSE, NOTIFY LAB WITHIN 5 DAYS.  LOWEST DETECTABLE LIMITS FOR URINE DRUG  SCREEN Drug Class                     Cutoff (ng/mL) Amphetamine and metabolites    1000 Barbiturate and metabolites    200 Benzodiazepine                 200 Tricyclics and metabolites     300 Opiates and metabolites        300 Cocaine and metabolites        300 THC                            50 Performed at [facility] Lab, [address], [place], [place] [address]    WBC 01/26/2021 4.5  4.0 - 10.5 K/uL Final   RBC 01/26/2021 4.17 (A) 4.22 - 5.81 MIL/uL Final   Hemoglobin 01/26/2021 14.2  13.0 - 17.0 g/dL Final   HCT 43/32/9518 42.1  39.0 - 52.0 % Final   MCV 01/26/2021 101.0 (A) 80.0 - 100.0 fL Final   MCH 01/26/2021 34.1 (A) 26.0 - 34.0 pg Final   MCHC 01/26/2021 33.7  30.0 - 36.0 g/dL Final   RDW 84/16/6063 13.5  11.5 - 15.5 % Final   Platelets 01/26/2021 180  150 - 400 K/uL Final   nRBC 01/26/2021 0.0  0.0 - 0.2 %  Final   Neutrophils Relative % 01/26/2021 48  % Final   Neutro Abs 01/26/2021 2.1  1.7 - 7.7 K/uL Final   Lymphocytes Relative 01/26/2021 39  % Final   Lymphs Abs 01/26/2021 1.8  0.7 - 4.0 K/uL Final   Monocytes Relative 01/26/2021 8  % Final   Monocytes Absolute 01/26/2021 0.4  0.1 - 1.0 K/uL Final   Eosinophils Relative 01/26/2021 5  % Final   Eosinophils Absolute 01/26/2021 0.2  0.0 - 0.5 K/uL Final   Basophils Relative 01/26/2021 0  % Final   Basophils Absolute 01/26/2021 0.0  0.0 - 0.1 K/uL Final   Immature Granulocytes 01/26/2021 0  % Final   Abs Immature Granulocytes 01/26/2021 0.01  0.00 - 0.07 K/uL Final   Performed at [facility] Lab, [address], [place], [place] [address]   Salicylate Lvl 01/26/2021 <7.0 (A) 7.0 - 30.0 mg/dL Final   Performed at [facility], [address] [place], [place] [address]   Acetaminophen (Tylenol), Serum 01/26/2021 <10 (A) 10 - 30 ug/mL Final   Comment: (NOTE) Therapeutic concentrations vary significantly. A range of 10-30 ug/mL  may be an effective concentration for many patients. However, some  are  best treated at concentrations outside of this range. Acetaminophen concentrations >150 ug/mL at 4 hours after ingestion  and >50 ug/mL at 12 hours after ingestion are often associated with  toxic reactions.  Performed at [facility], [address] [place], [place] [address]     Blood Alcohol level:  Lab Results  Component Value Date   [facility] <10 03/30/2021   ETH <10 01/26/2021    Metabolic Disorder Labs: Lab Results  Component Value Date   HGBA1C 5.8 (H) 04/02/2021   MPG 119.76 04/02/2021   No results found for: PROLACTIN Lab Results  Component Value Date   CHOL 190 04/02/2021   TRIG 66 04/02/2021   HDL 78 04/02/2021   CHOLHDL 2.4 04/02/2021   VLDL 13 04/02/2021   LDLCALC 99 04/02/2021    Therapeutic Lab Levels: No results found for: LITHIUM No results found for: VALPROATE No components found for:  CBMZ  Physical Findings   AIMS    Flowsheet Row [facility]  AIMS Total Score 0      AUDIT    Flowsheet Row [facility]  Alcohol Use Disorder Identification Test Final Score (AUDIT) 3      PHQ2-9    Flowsheet Row ED from 03/30/2021 in [facility] EMERGENCY DEPART[facility] ED from 05/25/2020 in [facility]  PHQ-2 Total Score 4 6  PHQ-9 Total Score 14 25      Flowsheet Row ED from 04/01/2021 in [facility] ED from 03/30/2021 in [facility] EMERGENCY DEPARTMENT ED from 01/26/2021 in [facility] EMERGENCY DEPARTMENT  C-SSRS RISK CATEGORY Low Risk High Risk High Risk        Musculoskeletal  Strength & Muscle Tone: within normal limits Gait & Station: normal Patient leans: N/A  Psychiatric Specialty Exam  Presentation  General Appearance: Appropriate for Environment; Casual  Eye  Contact:Good  Speech:Clear and Coherent; Normal Rate  Speech Volume:Normal  Handedness:Right   Mood and Affect  Mood:Euthymic  Affect:Appropriate; Congruent   Thought Process  Thought Processes:Coherent; Goal Directed; Linear  Descriptions of Associations:Intact  Orientation:Full (Time, Place and Person)  Thought Content:Logical; WDL  Diagnosis of Schizophrenia or Schizoaffective disorder in past: No  Duration of Psychotic Symptoms: Less than six months   Hallucinations:Hallucinations: None  Ideas of Reference:None  Suicidal Thoughts:Suicidal Thoughts: No  Homicidal Thoughts:Homicidal Thoughts: No   Sensorium  Memory:Immediate Good; Recent Good; Remote Good  Judgment:Fair  Insight:Fair   Executive Functions  Concentration:Good  Attention Span:Good  Recall:Good  Fund of Knowledge:Good  Language:Good   Psychomotor Activity  Psychomotor Activity:Psychomotor Activity: Normal   Assets  Assets:Communication Skills; Desire for Improvement; Social Support   Sleep  Sleep:Sleep: Fair   No data recorded   Physical Exam  Physical Exam Constitutional:      Appearance: Normal appearance. He is normal weight.  HENT:     Head: Normocephalic and atraumatic.  Eyes:     Extraocular Movements: Extraocular movements intact.  Pulmonary:     Effort: Pulmonary effort is normal.  Neurological:     General: No focal deficit present.     Mental Status: He is alert and oriented to person, place, and time.  Psychiatric:        Attention and Perception: Attention and perception normal.        Speech: Speech normal.        Behavior: Behavior normal. Behavior is cooperative.        Thought Content: Thought content normal.   Review of Systems  Constitutional:  Negative for chills and fever.  HENT:  Negative for hearing loss.   Eyes:  Negative for discharge and redness.  Respiratory:  Negative for cough.   Cardiovascular:  Negative for chest pain.   Gastrointestinal:  Negative for abdominal pain.  Musculoskeletal:  Negative for myalgias.  Neurological:  Positive for headaches.  Blood pressure 117/80, pulse 67, temperature 97.7 F (36.5 C), temperature source Tympanic, resp. rate 16, SpO2 98 %. There is no height or weight on file to calculate BMI.  Treatment Plan Summary: 60 year old man with a history of cocaine use, substance-induced mood disorder, MDD who presented to [facility] with suicidal ideations as well as reporting that he had attempted suicide via overdose on cocaine on 10/9.  Patient also reported prior to presentation that he attempted to grab his sister's gun; however, his sister intervened and recommended that he go to the hospital for assessment.  Patient was transferred from [facility] ED to [facility] on 10/11  for further treatment.  Patient tolerated Zoloft well-will increase to 50 mg tomorrow.  Patient denies active SI, plan or intent today.  Patient made appropriate for [facility] at this time for further  mood stabilization. MDD SIMD -increase zoloft to 50 mg starting tomorrow 10/14 (started 25 mg 10/12) -could consider seroquel -previously prescribed and could assist with cocaine use/cravings   Cocaine use disorder -UDS+cocaine -pt expresses interest in substance use treatment-will consult with SW for assistance   Healthcare maintenance - TSH 0.534 (wnl) -a1c 5.8 (mildly elevated) -lipid panel wnl   Dispo: ongoing. likely residential substance use treatment; SW consulted for assistance  Estella Husk, MD 04/03/2021 3:17 PM

## 2021-04-03 NOTE — Progress Notes (Signed)
SPIRITUALITY GROUP NOTE  Spirituality group facilitated by Wilkie Aye, MDiv, BCC.  Group Description: Group focused on topic of hope. Patients participated in facilitated discussion around topic, connecting with one another around experiences and definitions for hope. Group members engaged with visual explorer photos, reflecting on what hope looks like for them today. Group engaged in discussion around how their definitions of hope are present today in hospital.  Modalities: Psycho-social ed, Adlerian, Narrative, MI  Patient Progress:  Richard Moon was present throughout group.  Increasingly engaged when prompted by facilitator.  Noted he has difficulty finding hope - as he has been through a lot and finds himself with a feeling of "it is what it is"   Facilitator engaged in conversation around his honesty and groundedness in reality.  Cristina noted frustration with others who "keep on a mask" and feeling like he has to do the same.  Noted values of care, kindness, acceptance, honesty as important to him.

## 2021-04-03 NOTE — Progress Notes (Signed)
Patient was awake and out of bed for a short while he is now resting quietly.  He has no complaints or distress.  He remains dysphoric however presently denies avh shi or plan.  Encouraged him to seek out staff if overwhelmed and to have needs met.  Will continue to monitor and provide safe environment.

## 2021-04-04 DIAGNOSIS — F1994 Other psychoactive substance use, unspecified with psychoactive substance-induced mood disorder: Secondary | ICD-10-CM | POA: Diagnosis not present

## 2021-04-04 DIAGNOSIS — F1090 Alcohol use, unspecified, uncomplicated: Secondary | ICD-10-CM | POA: Diagnosis not present

## 2021-04-04 DIAGNOSIS — F332 Major depressive disorder, recurrent severe without psychotic features: Secondary | ICD-10-CM | POA: Diagnosis not present

## 2021-04-04 DIAGNOSIS — F1490 Cocaine use, unspecified, uncomplicated: Secondary | ICD-10-CM | POA: Diagnosis not present

## 2021-04-04 NOTE — ED Notes (Signed)
Patient stated that his pain is better but not completely gone, Patient is resting quietly in bed with eyes open. Will continue to monitor.

## 2021-04-04 NOTE — ED Notes (Signed)
Pt participated in the wrap up group. Snacks given 

## 2021-04-04 NOTE — Clinical Social Work Psych Note (Signed)
Anxiety  Diagnosis: Anxiety (Psychoeducational)  Date: 04/04/21  Type of Therapy/Therapeutic Modalities: Group Discussion, Psycho-Education  Participation Level: None  Objective: The purpose of the group is to educate patients on the various components of anxiety and how it can influence and/or contribute to the inappropriate or disproportionate responses to perceived threats, leading to persistent and intrusive symptoms associated with different anxiety disorders.   Therapeutic Goals:  Patient will learn the foundations of anxiety, including its definition and the various types of anxiety disorders.  Patient will discuss with group members their personal experiences with anxiety and how it has impacted their lives  Patient will learn various distress tolerance and relaxation techniques that are effective in treating anxiety symptoms.  Patient will discuss with group members how they plan to address and/or maintain their anxiety symptoms moving forward.   Summary of Patient's Progress:  Ramari was provided handouts and worksheets regarding the listed objective and therapeutic goals above.

## 2021-04-04 NOTE — Group Note (Signed)
Group Topic: Relapse and Recovery  Group Date: 04/04/2021 Start Time: 1000 End Time: 1100 Facilitators: Levander Campion  Department: [facility]  Number of Participants: 3  Group Focus: relapse prevention Treatment Modality:  Behavior Modification Therapy, Individual Therapy, and Interpersonal Therapy Interventions utilized were patient education and problem solving Purpose: enhance coping skills, express feelings, express irrational fears, relapse prevention strategies, and trigger / craving management  Name: Richard Moon Date of Birth: 03-25-1961  MR: [id number]    Level of Participation: moderate Quality of Participation: attentive and cooperative Interactions with others: gave feedback Mood/Affect: appropriate Triggers (if applicable): n/a Cognition: coherent/clear Progress: Minimal Response: n/a Plan: follow-up needed  Patients Problems:  Patient Active Problem List   Diagnosis Date Noted   Substance induced mood disorder (HCC) 04/01/2021   Polysubstance abuse (HCC) 01/28/2021   Suicidal ideations 01/28/2021   Suicidal ideation    Cocaine-induced mood disorder (HCC)    Cocaine dependence with cocaine-induced mood disorder (HCC)    MDD (major depressive disorder), severe (HCC) 03/12/2018

## 2021-04-04 NOTE — ED Notes (Signed)
Snacks given 

## 2021-04-04 NOTE — ED Notes (Signed)
Pt remains asleep no signs of distress noted q-85min checks maintained

## 2021-04-04 NOTE — ED Provider Notes (Signed)
Behavioral Health Progress Note  Date and Time: 04/04/2021 12:49 PM Name: Richard Moon MRN:  [id number]  Subjective:   60 year old man with a history of cocaine use, substance-induced mood disorder, MDD who presented to [facility] ED on 10/9 with suicidal ideations as well as reporting that he had attempted suicide via overdose on cocaine.  Patient also reported prior to presentation that he attempted to grab his sister's gun; however, his sister intervened and recommended that he go to the hospital for assessment.  Patient was transferred from [facility], ED to [facility] ton 10/11 for further treatment. UDS+cocaine  Patient seen and chart reviewed.  He has been medication compliant, has been attending groups, and has been appropriate with peers and staff.  On interview patient is found lying in bed in no acute distress.  Prior to interview patient was observed attending groups and eating lunch with peers in the caf.  Patient describes his mood as "pretty good".  Patient states that he slept well and denies issues with appetite.  Patient states his anxiety is "in between".  On further clarification he describes feeling anxious about where he will go upon discharge from the [facility] as most of the substance treatment facilities have long wait lists.  Patient reports some intermittent cramping of his hands as well as some dizziness that he experienced after lunch.  Informed patient I will ask the nurse to retake blood pressure.  Patient verbalized understanding.  No SI/HI/AVH.         Diagnosis:  Final diagnoses:  Substance induced mood disorder (HCC)  MDD (major depressive disorder), severe (HCC)    Total Time spent with patient: 15 minutes  Past Psychiatric History: see H&P Past Medical History: No past medical history on file.  Past Surgical History:  Procedure Laterality Date   HERNIA REPAIR     Family History: No family history on file. Family Psychiatric  History: see H&P Social  History:  Social History   Substance and Sexual Activity  Alcohol Use Yes   Comment: Occasionally      Social History   Substance and Sexual Activity  Drug Use Yes   Types: Marijuana, Cocaine, Heroin    Social History   Socioeconomic History   Marital status: Single    Spouse name: Not on file   Number of children: Not on file   Years of education: Not on file   Highest education level: Not on file  Occupational History   Not on file  Tobacco Use   Smoking status: Some Days    Packs/day: 0.25    Years: 20.00    Pack years: 5.00    Types: Cigarettes   Smokeless tobacco: Never  Vaping Use   Vaping Use: Every day  Substance and Sexual Activity   Alcohol use: Yes    Comment: Occasionally    Drug use: Yes    Types: Marijuana, Cocaine, Heroin   Sexual activity: Yes    Birth control/protection: Condom  Other Topics Concern   Not on file  Social History Narrative   Not on file   Social Determinants of Health   Financial Resource Strain: Not on file  Food Insecurity: Not on file  Transportation Needs: Not on file  Physical Activity: Not on file  Stress: Not on file  Social Connections: Not on file   SDOH:  SDOH Screenings   Alcohol Screen: Not on file  Depression (PHQ2-9): Medium Risk   PHQ-2 Score: 14  Financial Resource Strain: Not on  file  Food Insecurity: Not on file  Housing: Not on file  Physical Activity: Not on file  Social Connections: Not on file  Stress: Not on file  Tobacco Use: High Risk   Smoking Tobacco Use: Some Days   Smokeless Tobacco Use: Never  Transportation Needs: Not on file   Additional Social History:                         Sleep: Fair  Appetite:  Fair  Current Medications:  Current Facility-Administered Medications  Medication Dose Route Frequency Provider Last Rate Last Admin   acetaminophen (TYLENOL) tablet 650 mg  650 mg Oral Q6H PRN Estella Husk, MD   650 mg at 04/03/21 1409   alum & mag  hydroxide-simeth (MAALOX/MYLANTA) 200-200-20 MG/5ML suspension 30 mL  30 mL Oral Q4H PRN Estella Husk, MD   30 mL at 04/03/21 2151   hydrOXYzine (ATARAX/[name]) tablet 25 mg  25 mg Oral TID PRN Estella Husk, MD   25 mg at 04/03/21 2151   magnesium hydroxide (MILK OF MAGNESIA) suspension 30 mL  30 mL Oral Daily PRN Estella Husk, MD   30 mL at 04/02/21 2129   sertraline (ZOLOFT) tablet 50 mg  50 mg Oral Daily Estella Husk, MD   50 mg at 04/04/21 1004   traZODone (DESYREL) tablet 50 mg  50 mg Oral QHS PRN Estella Husk, MD   50 mg at 04/02/21 2127   Current Outpatient Medications  Medication Sig Dispense Refill   aspirin 325 MG tablet Take 325 mg by mouth daily as needed for headache.     ibuprofen (ADVIL) 100 MG tablet Take 400 mg by mouth every 6 (six) hours as needed (headache).      Labs  Lab Results:  Admission on 04/01/2021  Component Date Value Ref Range Status   TSH 04/02/2021 0.534  0.350 - 4.500 uIU/mL Final   Comment: Performed by a 3rd Generation assay with a functional sensitivity of <=0.01 uIU/mL. Performed at [facility] Lab, [address] [place], [place] [address]    Hgb A1c MFr Bld 04/02/2021 5.8 (A) 4.8 - 5.6 % Final   Comment: (NOTE) Pre diabetes:          5.7%-6.4%  Diabetes:              >6.4%  Glycemic control for   <7.0% adults with diabetes    Mean Plasma Glucose 04/02/2021 119.76  mg/dL Final   Performed at [facility] Lab, [address], [place], [place] [address]   Cholesterol 04/02/2021 190  0 - 200 mg/dL Final   Triglycerides 59/93/5701 66  <150 mg/dL Final   HDL 77/93/9030 78  >40 mg/dL Final   Total CHOL/HDL Ratio 04/02/2021 2.4  RATIO Final   VLDL 04/02/2021 13  0 - 40 mg/dL Final   LDL Cholesterol 04/02/2021 99  0 - 99 mg/dL Final   Comment:        Total Cholesterol/HDL:CHD Risk Coronary Heart Disease Risk Table                     Men   Women  1/2 Average Risk   3.4   3.3  Average Risk        5.0   4.4  2 X Average Risk   9.6   7.1  3 X Average Risk  23.4   11.0  Use the calculated Patient Ratio above and the CHD Risk Table to determine the patient's CHD Risk.        ATP III CLASSIFICATION (LDL):  <100     mg/dL   Optimal  161-096  mg/dL   Near or Above                    Optimal  130-159  mg/dL   Borderline  045-409  mg/dL   High  >811     mg/dL   Very High Performed at [facility] Lab, [address] [place], [place] [address]   Admission on 03/30/2021, Discharged on 04/01/2021  Component Date Value Ref Range Status   SARS Coronavirus 2 by RT PCR 03/31/2021 NEGATIVE  NEGATIVE Final   Comment: (NOTE) SARS-CoV-2 target nucleic acids are NOT DETECTED.  The SARS-CoV-2 RNA is generally detectable in upper respiratory specimens during the acute phase of infection. The lowest concentration of SARS-CoV-2 viral copies this assay can detect is 138 copies/mL. A negative result does not preclude SARS-Cov-2 infection and should not be used as the sole basis for treatment or other patient management decisions. A negative result may occur with  improper specimen collection/handling, submission of specimen other than nasopharyngeal swab, presence of viral mutation(s) within the areas targeted by this assay, and inadequate number of viral copies(<138 copies/mL). A negative result must be combined with clinical observations, patient history, and epidemiological information. The expected result is Negative.  Fact Sheet for Patients:  BloggerCourse.com  Fact Sheet for Healthcare Providers:  SeriousBroker.it  This test is no                          t yet approved or cleared by the [place] FDA and  has been authorized for detection and/or diagnosis of SARS-CoV-2 by FDA under an Emergency Use Authorization (EUA). This EUA will remain  in effect (meaning this test can be used) for the duration of the COVID-19  declaration under Section 564(b)(1) of the Act, 21 U.S.C.section 360bbb-3(b)(1), unless the authorization is terminated  or revoked sooner.       Influenza A by PCR 03/31/2021 NEGATIVE  NEGATIVE Final   Influenza B by PCR 03/31/2021 NEGATIVE  NEGATIVE Final   Comment: (NOTE) The Xpert Xpress SARS-CoV-2/FLU/RSV plus assay is intended as an aid in the diagnosis of influenza from Nasopharyngeal swab specimens and should not be used as a sole basis for treatment. Nasal washings and aspirates are unacceptable for Xpert Xpress SARS-CoV-2/FLU/RSV testing.  Fact Sheet for Patients: BloggerCourse.com  Fact Sheet for Healthcare Providers: SeriousBroker.it  This test is not yet approved or cleared by the [place] FDA and has been authorized for detection and/or diagnosis of SARS-CoV-2 by FDA under an Emergency Use Authorization (EUA). This EUA will remain in effect (meaning this test can be used) for the duration of the COVID-19 declaration under Section 564(b)(1) of the Act, 21 U.S.C. section 360bbb-3(b)(1), unless the authorization is terminated or revoked.  Performed at [facility] Lab, [address] [place], [place] [address]    Sodium 03/30/2021 142  135 - 145 mmol/L Final   Potassium 03/30/2021 3.8  3.5 - 5.1 mmol/L Final   Chloride 03/30/2021 107  98 - 111 mmol/L Final   CO2 03/30/2021 25  22 - 32 mmol/L Final   Glucose, Bld 03/30/2021 96  70 - 99 mg/dL Final   Glucose reference range applies only to samples taken  after fasting for at least 8 hours.   BUN 03/30/2021 14  6 - 20 mg/dL Final   Creatinine, Ser 03/30/2021 1.45 (A) 0.61 - 1.24 mg/dL Final   Calcium 58/02/9832 8.5 (A) 8.9 - 10.3 mg/dL Final   Total Protein 82/50/5397 5.7 (A) 6.5 - 8.1 g/dL Final   Albumin 67/34/1937 3.4 (A) 3.5 - 5.0 g/dL Final   AST 90/24/0973 43 (A) 15 - 41 U/L Final   ALT 03/30/2021 24  0 - 44 U/L Final   Alkaline Phosphatase 03/30/2021  53  38 - 126 U/L Final   Total Bilirubin 03/30/2021 0.7  0.3 - 1.2 mg/dL Final   GFR, Estimated 03/30/2021 56 (A) >60 mL/min Final   Comment: (NOTE) Calculated using the CKD-EPI Creatinine Equation (2021)    Anion gap 03/30/2021 10  5 - 15 Final   Performed at [facility] Lab, [address], [place], [place] [address]   Alcohol, Ethyl (B) 03/30/2021 <10  <10 mg/dL Final   Comment: (NOTE) Lowest detectable limit for serum alcohol is 10 mg/dL.  For medical purposes only. Performed at [facility] Lab, [address], [place], [place] [address]    Opiates 03/31/2021 NONE DETECTED  NONE DETECTED Final   Cocaine 03/31/2021 POSITIVE (A) NONE DETECTED Final   Benzodiazepines 03/31/2021 NONE DETECTED  NONE DETECTED Final   Amphetamines 03/31/2021 NONE DETECTED  NONE DETECTED Final   Tetrahydrocannabinol 03/31/2021 NONE DETECTED  NONE DETECTED Final   Barbiturates 03/31/2021 NONE DETECTED  NONE DETECTED Final   Comment: (NOTE) DRUG SCREEN FOR MEDICAL PURPOSES ONLY.  IF CONFIRMATION IS NEEDED FOR ANY PURPOSE, NOTIFY LAB WITHIN 5 DAYS.  LOWEST DETECTABLE LIMITS FOR URINE DRUG SCREEN Drug Class                     Cutoff (ng/mL) Amphetamine and metabolites    1000 Barbiturate and metabolites    200 Benzodiazepine                 200 Tricyclics and metabolites     300 Opiates and metabolites        300 Cocaine and metabolites        300 THC                            50 Performed at [facility] Lab, [address], [place], [place] [address]    WBC 03/30/2021 5.4  4.0 - 10.5 K/uL Final   RBC 03/30/2021 4.52  4.22 - 5.81 MIL/uL Final   Hemoglobin 03/30/2021 15.0  13.0 - 17.0 g/dL Final   HCT 22/29/7989 45.9  39.0 - 52.0 % Final   MCV 03/30/2021 101.5 (A) 80.0 - 100.0 fL Final   MCH 03/30/2021 33.2  26.0 - 34.0 pg Final   MCHC 03/30/2021 32.7  30.0 - 36.0 g/dL Final   RDW 21/19/4174 13.4  11.5 - 15.5 % Final   Platelets 03/30/2021 218  150 - 400 K/uL Final   nRBC  03/30/2021 0.0  0.0 - 0.2 % Final   Neutrophils Relative % 03/30/2021 37  % Final   Neutro Abs 03/30/2021 2.0  1.7 - 7.7 K/uL Final   Lymphocytes Relative 03/30/2021 44  % Final   Lymphs Abs 03/30/2021 2.4  0.7 - 4.0 K/uL Final   Monocytes Relative 03/30/2021 13  % Final   Monocytes Absolute 03/30/2021 0.7  0.1 - 1.0 K/uL Final   Eosinophils Relative  03/30/2021 6  % Final   Eosinophils Absolute 03/30/2021 0.4  0.0 - 0.5 K/uL Final   Basophils Relative 03/30/2021 0  % Final   Basophils Absolute 03/30/2021 0.0  0.0 - 0.1 K/uL Final   Immature Granulocytes 03/30/2021 0  % Final   Abs Immature Granulocytes 03/30/2021 0.02  0.00 - 0.07 K/uL Final   Performed at [facility] Lab, [address], [place], [place] [address]   Acetaminophen (Tylenol), Serum 03/30/2021 <10 (A) 10 - 30 ug/mL Final   Comment: (NOTE) Therapeutic concentrations vary significantly. A range of 10-30 ug/mL  may be an effective concentration for many patients. However, some  are best treated at concentrations outside of this range. Acetaminophen concentrations >150 ug/mL at 4 hours after ingestion  and >50 ug/mL at 12 hours after ingestion are often associated with  toxic reactions.  Performed at [facility], [address], [place], [place] [address]    Salicylate Lvl 03/30/2021 <7.0 (A) 7.0 - 30.0 mg/dL Final   Performed at [facility] Lab, [address] [place], [place] [address]  Admission on 01/26/2021, Discharged on 01/29/2021  Component Date Value Ref Range Status   SARS Coronavirus 2 by RT PCR 01/27/2021 NEGATIVE  NEGATIVE Final   Comment: (NOTE) SARS-CoV-2 target nucleic acids are NOT DETECTED.  The SARS-CoV-2 RNA is generally detectable in upper respiratory specimens during the acute phase of infection. The lowest concentration of SARS-CoV-2 viral copies this assay can detect is 138 copies/mL. A negative result does not preclude SARS-Cov-2 infection and should not be used as the sole basis  for treatment or other patient management decisions. A negative result may occur with  improper specimen collection/handling, submission of specimen other than nasopharyngeal swab, presence of viral mutation(s) within the areas targeted by this assay, and inadequate number of viral copies(<138 copies/mL). A negative result must be combined with clinical observations, patient history, and epidemiological information. The expected result is Negative.  Fact Sheet for Patients:  BloggerCourse.com  Fact Sheet for Healthcare Providers:  SeriousBroker.it  This test is no                          t yet approved or cleared by the [place] FDA and  has been authorized for detection and/or diagnosis of SARS-CoV-2 by FDA under an Emergency Use Authorization (EUA). This EUA will remain  in effect (meaning this test can be used) for the duration of the COVID-19 declaration under Section 564(b)(1) of the Act, 21 U.S.C.section 360bbb-3(b)(1), unless the authorization is terminated  or revoked sooner.       Influenza A by PCR 01/27/2021 NEGATIVE  NEGATIVE Final   Influenza B by PCR 01/27/2021 NEGATIVE  NEGATIVE Final   Comment: (NOTE) The Xpert Xpress SARS-CoV-2/FLU/RSV plus assay is intended as an aid in the diagnosis of influenza from Nasopharyngeal swab specimens and should not be used as a sole basis for treatment. Nasal washings and aspirates are unacceptable for Xpert Xpress SARS-CoV-2/FLU/RSV testing.  Fact Sheet for Patients: BloggerCourse.com  Fact Sheet for Healthcare Providers: SeriousBroker.it  This test is not yet approved or cleared by the [place] FDA and has been authorized for detection and/or diagnosis of SARS-CoV-2 by FDA under an Emergency Use Authorization (EUA). This EUA will remain in effect (meaning this test can be used) for the duration of the COVID-19  declaration under Section 564(b)(1) of the Act, 21 U.S.C. section 360bbb-3(b)(1), unless the authorization is  terminated or revoked.  Performed at [facility] Lab, [address] [place], [place] [address]    Sodium 01/26/2021 139  135 - 145 mmol/L Final   Potassium 01/26/2021 3.7  3.5 - 5.1 mmol/L Final   Chloride 01/26/2021 109  98 - 111 mmol/L Final   CO2 01/26/2021 23  22 - 32 mmol/L Final   Glucose, Bld 01/26/2021 124 (A) 70 - 99 mg/dL Final   Glucose reference range applies only to samples taken after fasting for at least 8 hours.   BUN 01/26/2021 15  6 - 20 mg/dL Final   Creatinine, Ser 01/26/2021 1.49 (A) 0.61 - 1.24 mg/dL Final   Calcium 62/13/0865 8.6 (A) 8.9 - 10.3 mg/dL Final   Total Protein 78/46/9629 5.1 (A) 6.5 - 8.1 g/dL Final   Albumin 52/84/1324 3.2 (A) 3.5 - 5.0 g/dL Final   AST 40/03/2724 38  15 - 41 U/L Final   ALT 01/26/2021 22  0 - 44 U/L Final   Alkaline Phosphatase 01/26/2021 41  38 - 126 U/L Final   Total Bilirubin 01/26/2021 0.5  0.3 - 1.2 mg/dL Final   GFR, Estimated 01/26/2021 54 (A) >60 mL/min Final   Comment: (NOTE) Calculated using the CKD-EPI Creatinine Equation (2021)    Anion gap 01/26/2021 7  5 - 15 Final   Performed at [facility] Lab, [address] [place], [place] [address]   Alcohol, Ethyl (B) 01/26/2021 <10  <10 mg/dL Final   Comment: (NOTE) Lowest detectable limit for serum alcohol is 10 mg/dL.  For medical purposes only. Performed at [facility] Lab, [address] [place], [place] [address]    Opiates 01/27/2021 NONE DETECTED  NONE DETECTED Final   Cocaine 01/27/2021 POSITIVE (A) NONE DETECTED Final   Benzodiazepines 01/27/2021 NONE DETECTED  NONE DETECTED Final   Amphetamines 01/27/2021 NONE DETECTED  NONE DETECTED Final   Tetrahydrocannabinol 01/27/2021 NONE DETECTED  NONE DETECTED Final   Barbiturates 01/27/2021 NONE DETECTED  NONE DETECTED Final   Comment: (NOTE) DRUG SCREEN FOR MEDICAL PURPOSES ONLY.  IF  CONFIRMATION IS NEEDED FOR ANY PURPOSE, NOTIFY LAB WITHIN 5 DAYS.  LOWEST DETECTABLE LIMITS FOR URINE DRUG SCREEN Drug Class                     Cutoff (ng/mL) Amphetamine and metabolites    1000 Barbiturate and metabolites    200 Benzodiazepine                 200 Tricyclics and metabolites     300 Opiates and metabolites        300 Cocaine and metabolites        300 THC                            50 Performed at [facility] Lab, [address] [place], [place] [address]    WBC 01/26/2021 4.5  4.0 - 10.5 K/uL Final   RBC 01/26/2021 4.17 (A) 4.22 - 5.81 MIL/uL Final   Hemoglobin 01/26/2021 14.2  13.0 - 17.0 g/dL Final   HCT 38/75/6433 42.1  39.0 - 52.0 % Final   MCV 01/26/2021 101.0 (A) 80.0 - 100.0 fL Final   MCH 01/26/2021 34.1 (A) 26.0 - 34.0 pg Final   MCHC 01/26/2021 33.7  30.0 - 36.0 g/dL Final   RDW 29/51/8841 13.5  11.5 - 15.5 % Final   Platelets 01/26/2021 180  150 -  400 K/uL Final   nRBC 01/26/2021 0.0  0.0 - 0.2 % Final   Neutrophils Relative % 01/26/2021 48  % Final   Neutro Abs 01/26/2021 2.1  1.7 - 7.7 K/uL Final   Lymphocytes Relative 01/26/2021 39  % Final   Lymphs Abs 01/26/2021 1.8  0.7 - 4.0 K/uL Final   Monocytes Relative 01/26/2021 8  % Final   Monocytes Absolute 01/26/2021 0.4  0.1 - 1.0 K/uL Final   Eosinophils Relative 01/26/2021 5  % Final   Eosinophils Absolute 01/26/2021 0.2  0.0 - 0.5 K/uL Final   Basophils Relative 01/26/2021 0  % Final   Basophils Absolute 01/26/2021 0.0  0.0 - 0.1 K/uL Final   Immature Granulocytes 01/26/2021 0  % Final   Abs Immature Granulocytes 01/26/2021 0.01  0.00 - 0.07 K/uL Final   Performed at [facility] Lab, [address], [place], [place] [address]   Salicylate Lvl 01/26/2021 <7.0 (A) 7.0 - 30.0 mg/dL Final   Performed at [facility] Lab, [address], [place], [place] [address]   Acetaminophen (Tylenol), Serum 01/26/2021 <10 (A) 10 - 30 ug/mL Final   Comment: (NOTE) Therapeutic concentrations vary  significantly. A range of 10-30 ug/mL  may be an effective concentration for many patients. However, some  are best treated at concentrations outside of this range. Acetaminophen concentrations >150 ug/mL at 4 hours after ingestion  and >50 ug/mL at 12 hours after ingestion are often associated with  toxic reactions.  Performed at [facility] Lab, [address] [place], [place] [address]     Blood Alcohol level:  Lab Results  Component Value Date   [facility] <10 03/30/2021   ETH <10 01/26/2021    Metabolic Disorder Labs: Lab Results  Component Value Date   HGBA1C 5.8 (H) 04/02/2021   MPG 119.76 04/02/2021   No results found for: PROLACTIN Lab Results  Component Value Date   CHOL 190 04/02/2021   TRIG 66 04/02/2021   HDL 78 04/02/2021   CHOLHDL 2.4 04/02/2021   VLDL 13 04/02/2021   LDLCALC 99 04/02/2021    Therapeutic Lab Levels: No results found for: LITHIUM No results found for: VALPROATE No components found for:  CBMZ  Physical Findings   AIMS    Flowsheet Row [facility]  AIMS Total Score 0      AUDIT    Flowsheet Row [facility]  Alcohol Use Disorder Identification Test Final Score (AUDIT) 3      PHQ2-9    Flowsheet Row ED from 03/30/2021 in [facility]PART[facility] ED from 05/25/2020 in [facility] EMERGENCY DEPARTMENT  PHQ-2 Total Score 4 6  PHQ-9 Total Score 14 25      Flowsheet Row ED from 04/01/2021 in [facility] ED from 03/30/2021 in [facility] EMERGENCY DEPARTMENT ED from 01/26/2021 in [facility] EMERGENCY DEPARTMENT  C-SSRS RISK CATEGORY Low Risk High Risk High Risk        Musculoskeletal  Strength & Muscle Tone: within normal limits Gait & Station: normal Patient leans:  N/A  Psychiatric Specialty Exam  Presentation  General Appearance: Appropriate for Environment; Casual  Eye Contact:Good  Speech:Clear and Coherent; Normal Rate  Speech Volume:Normal  Handedness:Right   Mood and Affect  Mood:Euthymic  Affect:Appropriate; Congruent   Thought Process  Thought Processes:Coherent; Goal  Directed; Linear  Descriptions of Associations:Intact  Orientation:Full (Time, Place and Person)  Thought Content:Logical; WDL  Diagnosis of Schizophrenia or Schizoaffective disorder in past: No  Duration of Psychotic Symptoms: Less than six months   Hallucinations:Hallucinations: None  Ideas of Reference:None  Suicidal Thoughts:Suicidal Thoughts: No  Homicidal Thoughts:Homicidal Thoughts: No   Sensorium  Memory:Immediate Good; Recent Good; Remote Good  Judgment:Good  Insight:Good   Executive Functions  Concentration:Good  Attention Span:Good  Recall:Good  Fund of Knowledge:Good  Language:Good   Psychomotor Activity  Psychomotor Activity:Psychomotor Activity: Normal   Assets  Assets:Communication Skills; Desire for Improvement; Resilience   Sleep  Sleep:Sleep: Fair   No data recorded   Physical Exam  Physical Exam Constitutional:      Appearance: Normal appearance. He is normal weight.  HENT:     Head: Normocephalic and atraumatic.  Eyes:     Extraocular Movements: Extraocular movements intact.  Pulmonary:     Effort: Pulmonary effort is normal.  Neurological:     General: No focal deficit present.     Mental Status: He is alert and oriented to person, place, and time.  Psychiatric:        Attention and Perception: Attention and perception normal.        Speech: Speech normal.        Behavior: Behavior normal. Behavior is cooperative.        Thought Content: Thought content normal.   Review of Systems  Constitutional:  Negative for chills and fever.  HENT:  Negative for hearing loss.   Eyes:  Negative for  discharge and redness.  Respiratory:  Negative for cough.   Cardiovascular:  Negative for chest pain.  Gastrointestinal:  Negative for abdominal pain.  Musculoskeletal:  Negative for myalgias.       +cramping of hands  Neurological:  Positive for headaches.  Blood pressure 114/73, pulse 69, temperature 98.7 F (37.1 C), resp. rate 16, SpO2 100 %. There is no height or weight on file to calculate BMI.  Treatment Plan Summary: 60 year old man with a history of cocaine use, substance-induced mood disorder, MDD who presented to [facility] with suicidal ideations as well as reporting that he had attempted suicide via overdose on cocaine on 10/9.  Patient also reported prior to presentation that he attempted to grab his sister's gun; however, his sister intervened and recommended that he go to the hospital for assessment.  Patient was transferred from [facility], ED to [facility] on 10/11  for further treatment.  Patient tolerated increased dose of zoloft well.   Patient denies active SI, plan or intent today.  Patient made appropriate for [facility] at this time for further  mood stabilization.  MDD SIMD -increase zoloft to 50 mg starting tomorrow 10/14 (started 25 mg 10/12) -could consider seroquel in the future, will defer for now as a1c is elevated and patient is not reporting current cravings -previously prescribed and could assist with cocaine use/cravings   Cocaine use disorder -UDS+cocaine -pt expresses interest in substance use treatment-will consult with SW for assistance   Healthcare maintenance - TSH 0.534 (wnl) -a1c 5.8 (mildly elevated) -lipid panel wnl   Dispo: ongoing. likely residential substance use treatment; SW consulted for assistance. Likely discharge on or before Monday.  Estella Husk, MD 04/04/2021 12:49 PM

## 2021-04-04 NOTE — Clinical Social Work Psych Note (Signed)
CSW Update   Richard Moon continues to express interest in residential substance abuse treatment following discharge from the [facility]. CSW reiterated the following information:  [name] - out of network [facility] -  no beds available currently; two week wait list  [facility] - no beds available currently; four week wait list  [facility]) - no answer. CSW has left two voicemails requesting a call back.  [facility]- out of network  CSW also referred the patient to [employer] and [facility] [facility] in [place], [place] for possible review.   Richard Moon expressed understanding. He denied having any additional questions or concerns at this time.   CSW will continue to follow.     Baldo Daub, MSW, LCSW Clinical Child psychotherapist (Facility Based Crisis) [facility]

## 2021-04-04 NOTE — ED Notes (Signed)
Patient is resting quietly with eyes closed, respirations even unlabored. No signs/symptoms of distress, will continue to monitor.  Note from earlier conversation: Patient wants provider to know about the cramps he has in his fingers.

## 2021-04-04 NOTE — Progress Notes (Signed)
Received Richard Moon this Am asleep in his bed, he woke up on his own and received breakfast. He was compliant with his medication and attended the group therapy session. He is social with his peers at intervals.

## 2021-04-04 NOTE — Progress Notes (Signed)
Sylus remained in the milieu at intervals throughout the PM. He endorsed feeling depressed. He denied feeling suicidal this PM.

## 2021-04-05 DIAGNOSIS — F332 Major depressive disorder, recurrent severe without psychotic features: Secondary | ICD-10-CM | POA: Diagnosis not present

## 2021-04-05 DIAGNOSIS — F1490 Cocaine use, unspecified, uncomplicated: Secondary | ICD-10-CM | POA: Diagnosis not present

## 2021-04-05 DIAGNOSIS — F1994 Other psychoactive substance use, unspecified with psychoactive substance-induced mood disorder: Secondary | ICD-10-CM | POA: Diagnosis not present

## 2021-04-05 DIAGNOSIS — F1090 Alcohol use, unspecified, uncomplicated: Secondary | ICD-10-CM | POA: Diagnosis not present

## 2021-04-05 MED ORDER — TRAZODONE HCL 100 MG PO TABS
100.0000 mg | ORAL_TABLET | Freq: Every evening | ORAL | Status: DC | PRN
  Administered 2021-04-06: 100 mg via ORAL
  Filled 2021-04-05: qty 1
  Filled 2021-04-05: qty 7

## 2021-04-05 NOTE — ED Provider Notes (Signed)
Behavioral Health Progress Note  Date and Time: 04/05/2021 8:11 AM Name: Richard Moon MRN:  [id number]  Subjective:  60 year old man with a history of cocaine use, substance-induced mood disorder, MDD who presented to [facility] with suicidal ideations as well as reporting that he had attempted suicide via overdose on cocaine on 10/9.  Patient also reported prior to presentation that he attempted to grab his sister's gun; however, his sister intervened and recommended that he go to the hospital for assessment.  Patient was transferred from [facility], ED to [facility] on 10/11  for further treatment.  Patient tolerated increased dose of zoloft well.   Patient denies active SI, plan or intent today.  Patient made appropriate for [facility] at this time for further  mood stabilization  Patient seen and reevaluated face-to-face by this provider and chart reviewed. On evaluation, patient is alert and oriented x4. His thought process is logical and speech is clear and coherent. His mood is dysphoric and affect is congruent. He denies thoughts of wanting to hurt himself or others. He denies hearing voices or seeing things that other people cannot hear or see. He does not appear to be responding to internal or external stimuli. He rates his mood 7/10 with 10 being the worst. He reports feeling sad and down today. He reports a fair appetite. He reports poor sleep last night and states he kept waking up throughout the night. He reports that he has no support and his sister probably does not want to have anything else to do with him. He reports that he may be facing eviction because he has not paid his rent on his room in about 3 weeks. He states that he is looking forward to going to rehab when he discharges. He reports tolerating the Zoloft and Trazodone without any side effects. I discussed with the patient increasing the trazodone to 100 mg tonight to help with sleep. Patient verbalizes understanding and agrees to the stated  plan.  Diagnosis:  Final diagnoses:  Substance induced mood disorder (HCC)  MDD (major depressive disorder), severe (HCC)    Total Time spent with patient: 15 minutes  Past Psychiatric History: Past Psychiatric History: Previous Medication Trials: patient unable to recall medications; per chart review, has been on zoloft and seroquel in the past Previous Psychiatric Hospitalizations: yes, [facility] in 2019 Previous Suicide Attempts: yes, states he attempted just prior to presentation via cocaine OD History of Violence: patient did not provide answer Outpatient psychiatrist: no  Past Medical History: No past medical history on file.  Past Surgical History:  Procedure Laterality Date   HERNIA REPAIR     Family History: No family history on file. Family Psychiatric  History: Daughter completed suicide in 2012 Social History:  Social History   Substance and Sexual Activity  Alcohol Use Yes   Comment: Occasionally      Social History   Substance and Sexual Activity  Drug Use Yes   Types: Marijuana, Cocaine, Heroin    Social History   Socioeconomic History   Marital status: Single    Spouse name: Not on file   Number of children: Not on file   Years of education: Not on file   Highest education level: Not on file  Occupational History   Not on file  Tobacco Use   Smoking status: Some Days    Packs/day: 0.25    Years: 20.00    Pack years: 5.00    Types: Cigarettes   Smokeless tobacco: Never  Vaping Use   Vaping Use: Every day  Substance and Sexual Activity   Alcohol use: Yes    Comment: Occasionally    Drug use: Yes    Types: Marijuana, Cocaine, Heroin   Sexual activity: Yes    Birth control/protection: Condom  Other Topics Concern   Not on file  Social History Narrative   Not on file   Social Determinants of Health   Financial Resource Strain: Not on file  Food Insecurity: Not on file  Transportation Needs: Not on file  Physical Activity: Not on file   Stress: Not on file  Social Connections: Not on file   SDOH:  SDOH Screenings   Alcohol Screen: Not on file  Depression (PHQ2-9): Medium Risk   PHQ-2 Score: 14  Financial Resource Strain: Not on file  Food Insecurity: Not on file  Housing: Not on file  Physical Activity: Not on file  Social Connections: Not on file  Stress: Not on file  Tobacco Use: High Risk   Smoking Tobacco Use: Some Days   Smokeless Tobacco Use: Never  Transportation Needs: Not on file   Additional Social History:   Current Medications:  Current Facility-Administered Medications  Medication Dose Route Frequency Provider Last Rate Last Admin   acetaminophen (TYLENOL) tablet 650 mg  650 mg Oral Q6H PRN Estella Husk, MD   650 mg at 04/04/21 2046   alum & mag hydroxide-simeth (MAALOX/MYLANTA) 200-200-20 MG/5ML suspension 30 mL  30 mL Oral Q4H PRN Estella Husk, MD   30 mL at 04/04/21 2046   hydrOXYzine (ATARAX/[name]) tablet 25 mg  25 mg Oral TID PRN Estella Husk, MD   25 mg at 04/03/21 2151   magnesium hydroxide (MILK OF MAGNESIA) suspension 30 mL  30 mL Oral Daily PRN Estella Husk, MD   30 mL at 04/02/21 2129   sertraline (ZOLOFT) tablet 50 mg  50 mg Oral Daily Estella Husk, MD   50 mg at 04/04/21 1004   traZODone (DESYREL) tablet 50 mg  50 mg Oral QHS PRN Estella Husk, MD   50 mg at 04/04/21 2047   Current Outpatient Medications  Medication Sig Dispense Refill   aspirin 325 MG tablet Take 325 mg by mouth daily as needed for headache.     ibuprofen (ADVIL) 100 MG tablet Take 400 mg by mouth every 6 (six) hours as needed (headache).      Labs  Lab Results:  Admission on 04/01/2021  Component Date Value Ref Range Status   TSH 04/02/2021 0.534  0.350 - 4.500 uIU/mL Final   Comment: Performed by a 3rd Generation assay with a functional sensitivity of <=0.01 uIU/mL. Performed at [facility], [address], [place], [place] [address]    Hgb A1c MFr  Bld 04/02/2021 5.8 (A) 4.8 - 5.6 % Final   Comment: (NOTE) Pre diabetes:          5.7%-6.4%  Diabetes:              >6.4%  Glycemic control for   <7.0% adults with diabetes    Mean Plasma Glucose 04/02/2021 119.76  mg/dL Final   Performed at [facility], [address] [place], [place] [address]   Cholesterol 04/02/2021 190  0 - 200 mg/dL Final   Triglycerides 09/81/1914 66  <150 mg/dL Final   HDL 78/29/5621 78  >40 mg/dL Final   Total CHOL/HDL Ratio 04/02/2021 2.4  RATIO Final   VLDL 04/02/2021 13  0 - 40 mg/dL Final   LDL Cholesterol 04/02/2021 99  0 - 99 mg/dL Final   Comment:        Total Cholesterol/HDL:CHD Risk Coronary Heart Disease Risk Table                     Men   Women  1/2 Average Risk   3.4   3.3  Average Risk       5.0   4.4  2 X Average Risk   9.6   7.1  3 X Average Risk  23.4   11.0        Use the calculated Patient Ratio above and the CHD Risk Table to determine the patient's CHD Risk.        ATP III CLASSIFICATION (LDL):  <100     mg/dL   Optimal  161-096  mg/dL   Near or Above                    Optimal  130-159  mg/dL   Borderline  045-409  mg/dL   High  >811     mg/dL   Very High Performed at [facility], [address] [place], [place] [address]   Admission on 03/30/2021, Discharged on 04/01/2021  Component Date Value Ref Range Status   SARS Coronavirus 2 by RT PCR 03/31/2021 NEGATIVE  NEGATIVE Final   Comment: (NOTE) SARS-CoV-2 target nucleic acids are NOT DETECTED.  The SARS-CoV-2 RNA is generally detectable in upper respiratory specimens during the acute phase of infection. The lowest concentration of SARS-CoV-2 viral copies this assay can detect is 138 copies/mL. A negative result does not preclude SARS-Cov-2 infection and should not be used as the sole basis for treatment or other patient management decisions. A negative result may occur with  improper specimen collection/handling, submission of specimen other than  nasopharyngeal swab, presence of viral mutation(s) within the areas targeted by this assay, and inadequate number of viral copies(<138 copies/mL). A negative result must be combined with clinical observations, patient history, and epidemiological information. The expected result is Negative.  Fact Sheet for Patients:  BloggerCourse.com  Fact Sheet for Healthcare Providers:  SeriousBroker.it  This test is no                          t yet approved or cleared by the [place] FDA and  has been authorized for detection and/or diagnosis of SARS-CoV-2 by FDA under an Emergency Use Authorization (EUA). This EUA will remain  in effect (meaning this test can be used) for the duration of the COVID-19 declaration under Section 564(b)(1) of the Act, 21 U.S.C.section 360bbb-3(b)(1), unless the authorization is terminated  or revoked sooner.       Influenza A by PCR 03/31/2021 NEGATIVE  NEGATIVE Final   Influenza B by PCR 03/31/2021 NEGATIVE  NEGATIVE Final   Comment: (NOTE) The Xpert Xpress SARS-CoV-2/FLU/RSV plus assay is intended as an aid in the diagnosis of influenza from Nasopharyngeal swab specimens and should not be used as a sole basis for treatment. Nasal washings and aspirates are unacceptable for Xpert Xpress SARS-CoV-2/FLU/RSV testing.  Fact Sheet for Patients: BloggerCourse.com  Fact Sheet for Healthcare Providers: SeriousBroker.it  This test is not yet approved or cleared by the [place] FDA and has been authorized for detection and/or diagnosis of SARS-CoV-2 by FDA under an Emergency Use Authorization (EUA). This EUA will remain in effect (meaning  this test can be used) for the duration of the COVID-19 declaration under Section 564(b)(1) of the Act, 21 U.S.C. section 360bbb-3(b)(1), unless the authorization is terminated or revoked.  Performed at [facility], [address], [place], [place] [address]    Sodium 03/30/2021 142  135 - 145 mmol/L Final   Potassium 03/30/2021 3.8  3.5 - 5.1 mmol/L Final   Chloride 03/30/2021 107  98 - 111 mmol/L Final   CO2 03/30/2021 25  22 - 32 mmol/L Final   Glucose, Bld 03/30/2021 96  70 - 99 mg/dL Final   Glucose reference range applies only to samples taken after fasting for at least 8 hours.   BUN 03/30/2021 14  6 - 20 mg/dL Final   Creatinine, Ser 03/30/2021 1.45 (A) 0.61 - 1.24 mg/dL Final   Calcium 05/39/7673 8.5 (A) 8.9 - 10.3 mg/dL Final   Total Protein 41/93/7902 5.7 (A) 6.5 - 8.1 g/dL Final   Albumin 40/97/3532 3.4 (A) 3.5 - 5.0 g/dL Final   AST 99/24/2683 43 (A) 15 - 41 U/L Final   ALT 03/30/2021 24  0 - 44 U/L Final   Alkaline Phosphatase 03/30/2021 53  38 - 126 U/L Final   Total Bilirubin 03/30/2021 0.7  0.3 - 1.2 mg/dL Final   GFR, Estimated 03/30/2021 56 (A) >60 mL/min Final   Comment: (NOTE) Calculated using the CKD-EPI Creatinine Equation (2021)    Anion gap 03/30/2021 10  5 - 15 Final   Performed at [facility], [address] [place], [place] [address]   Alcohol, Ethyl (B) 03/30/2021 <10  <10 mg/dL Final   Comment: (NOTE) Lowest detectable limit for serum alcohol is 10 mg/dL.  For medical purposes only. Performed at [facility], [address], [place], [place] [address]    Opiates 03/31/2021 NONE DETECTED  NONE DETECTED Final   Cocaine 03/31/2021 POSITIVE (A) NONE DETECTED Final   Benzodiazepines 03/31/2021 NONE DETECTED  NONE DETECTED Final   Amphetamines 03/31/2021 NONE DETECTED  NONE DETECTED Final   Tetrahydrocannabinol 03/31/2021 NONE DETECTED  NONE DETECTED Final   Barbiturates 03/31/2021 NONE DETECTED  NONE DETECTED Final   Comment: (NOTE) DRUG SCREEN FOR MEDICAL PURPOSES ONLY.  IF CONFIRMATION IS NEEDED FOR ANY PURPOSE, NOTIFY LAB WITHIN 5 DAYS.  LOWEST DETECTABLE LIMITS FOR URINE DRUG SCREEN Drug Class                     Cutoff  (ng/mL) Amphetamine and metabolites    1000 Barbiturate and metabolites    200 Benzodiazepine                 200 Tricyclics and metabolites     300 Opiates and metabolites        300 Cocaine and metabolites        300 THC                            50 Performed at [facility], [address] [place], [place] [address]    WBC 03/30/2021 5.4  4.0 - 10.5 K/uL Final   RBC 03/30/2021 4.52  4.22 - 5.81 MIL/uL Final   Hemoglobin 03/30/2021 15.0  13.0 - 17.0 g/dL Final   HCT 94/17/4081 45.9  39.0 - 52.0 % Final   MCV 03/30/2021 101.5 (A) 80.0 - 100.0 fL Final   MCH 03/30/2021 33.2  26.0 - 34.0 pg Final  MCHC 03/30/2021 32.7  30.0 - 36.0 g/dL Final   RDW 18/84/1660 13.4  11.5 - 15.5 % Final   Platelets 03/30/2021 218  150 - 400 K/uL Final   nRBC 03/30/2021 0.0  0.0 - 0.2 % Final   Neutrophils Relative % 03/30/2021 37  % Final   Neutro Abs 03/30/2021 2.0  1.7 - 7.7 K/uL Final   Lymphocytes Relative 03/30/2021 44  % Final   Lymphs Abs 03/30/2021 2.4  0.7 - 4.0 K/uL Final   Monocytes Relative 03/30/2021 13  % Final   Monocytes Absolute 03/30/2021 0.7  0.1 - 1.0 K/uL Final   Eosinophils Relative 03/30/2021 6  % Final   Eosinophils Absolute 03/30/2021 0.4  0.0 - 0.5 K/uL Final   Basophils Relative 03/30/2021 0  % Final   Basophils Absolute 03/30/2021 0.0  0.0 - 0.1 K/uL Final   Immature Granulocytes 03/30/2021 0  % Final   Abs Immature Granulocytes 03/30/2021 0.02  0.00 - 0.07 K/uL Final   Performed at [facility], [address] [place], [place] [address]   Acetaminophen (Tylenol), Serum 03/30/2021 <10 (A) 10 - 30 ug/mL Final   Comment: (NOTE) Therapeutic concentrations vary significantly. A range of 10-30 ug/mL  may be an effective concentration for many patients. However, some  are best treated at concentrations outside of this range. Acetaminophen concentrations >150 ug/mL at 4 hours after ingestion  and >50 ug/mL at 12 hours after ingestion are often associated with   toxic reactions.  Performed at [facility], [address] [place], [place] [address]    Salicylate Lvl 03/30/2021 <7.0 (A) 7.0 - 30.0 mg/dL Final   Performed at [facility], [address] [place], [place] [address]  Admission on 01/26/2021, Discharged on 01/29/2021  Component Date Value Ref Range Status   SARS Coronavirus 2 by RT PCR 01/27/2021 NEGATIVE  NEGATIVE Final   Comment: (NOTE) SARS-CoV-2 target nucleic acids are NOT DETECTED.  The SARS-CoV-2 RNA is generally detectable in upper respiratory specimens during the acute phase of infection. The lowest concentration of SARS-CoV-2 viral copies this assay can detect is 138 copies/mL. A negative result does not preclude SARS-Cov-2 infection and should not be used as the sole basis for treatment or other patient management decisions. A negative result may occur with  improper specimen collection/handling, submission of specimen other than nasopharyngeal swab, presence of viral mutation(s) within the areas targeted by this assay, and inadequate number of viral copies(<138 copies/mL). A negative result must be combined with clinical observations, patient history, and epidemiological information. The expected result is Negative.  Fact Sheet for Patients:  BloggerCourse.com  Fact Sheet for Healthcare Providers:  SeriousBroker.it  This test is no                          t yet approved or cleared by the [place] FDA and  has been authorized for detection and/or diagnosis of SARS-CoV-2 by FDA under an Emergency Use Authorization (EUA). This EUA will remain  in effect (meaning this test can be used) for the duration of the COVID-19 declaration under Section 564(b)(1) of the Act, 21 U.S.C.section 360bbb-3(b)(1), unless the authorization is terminated  or revoked sooner.       Influenza A by PCR 01/27/2021 NEGATIVE  NEGATIVE Final   Influenza B by PCR  01/27/2021 NEGATIVE  NEGATIVE Final   Comment: (NOTE) The Xpert Xpress SARS-CoV-2/FLU/RSV plus assay is intended as an  aid in the diagnosis of influenza from Nasopharyngeal swab specimens and should not be used as a sole basis for treatment. Nasal washings and aspirates are unacceptable for Xpert Xpress SARS-CoV-2/FLU/RSV testing.  Fact Sheet for Patients: BloggerCourse.com  Fact Sheet for Healthcare Providers: SeriousBroker.it  This test is not yet approved or cleared by the [place] FDA and has been authorized for detection and/or diagnosis of SARS-CoV-2 by FDA under an Emergency Use Authorization (EUA). This EUA will remain in effect (meaning this test can be used) for the duration of the COVID-19 declaration under Section 564(b)(1) of the Act, 21 U.S.C. section 360bbb-3(b)(1), unless the authorization is terminated or revoked.  Performed at [facility], [address] [place], [place] [address]    Sodium 01/26/2021 139  135 - 145 mmol/L Final   Potassium 01/26/2021 3.7  3.5 - 5.1 mmol/L Final   Chloride 01/26/2021 109  98 - 111 mmol/L Final   CO2 01/26/2021 23  22 - 32 mmol/L Final   Glucose, Bld 01/26/2021 124 (A) 70 - 99 mg/dL Final   Glucose reference range applies only to samples taken after fasting for at least 8 hours.   BUN 01/26/2021 15  6 - 20 mg/dL Final   Creatinine, Ser 01/26/2021 1.49 (A) 0.61 - 1.24 mg/dL Final   Calcium 31/49/7026 8.6 (A) 8.9 - 10.3 mg/dL Final   Total Protein 37/85/8850 5.1 (A) 6.5 - 8.1 g/dL Final   Albumin 27/74/1287 3.2 (A) 3.5 - 5.0 g/dL Final   AST 86/76/7209 38  15 - 41 U/L Final   ALT 01/26/2021 22  0 - 44 U/L Final   Alkaline Phosphatase 01/26/2021 41  38 - 126 U/L Final   Total Bilirubin 01/26/2021 0.5  0.3 - 1.2 mg/dL Final   GFR, Estimated 01/26/2021 54 (A) >60 mL/min Final   Comment: (NOTE) Calculated using the CKD-EPI Creatinine Equation (2021)    Anion gap  01/26/2021 7  5 - 15 Final   Performed at [facility], [address], [place], [place] [address]   Alcohol, Ethyl (B) 01/26/2021 <10  <10 mg/dL Final   Comment: (NOTE) Lowest detectable limit for serum alcohol is 10 mg/dL.  For medical purposes only. Performed at [facility], [address] [place], [place] [address]    Opiates 01/27/2021 NONE DETECTED  NONE DETECTED Final   Cocaine 01/27/2021 POSITIVE (A) NONE DETECTED Final   Benzodiazepines 01/27/2021 NONE DETECTED  NONE DETECTED Final   Amphetamines 01/27/2021 NONE DETECTED  NONE DETECTED Final   Tetrahydrocannabinol 01/27/2021 NONE DETECTED  NONE DETECTED Final   Barbiturates 01/27/2021 NONE DETECTED  NONE DETECTED Final   Comment: (NOTE) DRUG SCREEN FOR MEDICAL PURPOSES ONLY.  IF CONFIRMATION IS NEEDED FOR ANY PURPOSE, NOTIFY LAB WITHIN 5 DAYS.  LOWEST DETECTABLE LIMITS FOR URINE DRUG SCREEN Drug Class                     Cutoff (ng/mL) Amphetamine and metabolites    1000 Barbiturate and metabolites    200 Benzodiazepine                 200 Tricyclics and metabolites     300 Opiates and metabolites        300 Cocaine and metabolites        300 THC  50 Performed at [facility], [address] [place], [place] [address]    WBC 01/26/2021 4.5  4.0 - 10.5 K/uL Final   RBC 01/26/2021 4.17 (A) 4.22 - 5.81 MIL/uL Final   Hemoglobin 01/26/2021 14.2  13.0 - 17.0 g/dL Final   HCT 60/45/4098 42.1  39.0 - 52.0 % Final   MCV 01/26/2021 101.0 (A) 80.0 - 100.0 fL Final   MCH 01/26/2021 34.1 (A) 26.0 - 34.0 pg Final   MCHC 01/26/2021 33.7  30.0 - 36.0 g/dL Final   RDW 11/91/4782 13.5  11.5 - 15.5 % Final   Platelets 01/26/2021 180  150 - 400 K/uL Final   nRBC 01/26/2021 0.0  0.0 - 0.2 % Final   Neutrophils Relative % 01/26/2021 48  % Final   Neutro Abs 01/26/2021 2.1  1.7 - 7.7 K/uL Final   Lymphocytes Relative 01/26/2021 39  % Final   Lymphs Abs 01/26/2021 1.8  0.7 - 4.0 K/uL  Final   Monocytes Relative 01/26/2021 8  % Final   Monocytes Absolute 01/26/2021 0.4  0.1 - 1.0 K/uL Final   Eosinophils Relative 01/26/2021 5  % Final   Eosinophils Absolute 01/26/2021 0.2  0.0 - 0.5 K/uL Final   Basophils Relative 01/26/2021 0  % Final   Basophils Absolute 01/26/2021 0.0  0.0 - 0.1 K/uL Final   Immature Granulocytes 01/26/2021 0  % Final   Abs Immature Granulocytes 01/26/2021 0.01  0.00 - 0.07 K/uL Final   Performed at [facility], [address], [place], [place] [address]   Salicylate Lvl 01/26/2021 <7.0 (A) 7.0 - 30.0 mg/dL Final   Performed at [facility], [address] [place], [place] [address]   Acetaminophen (Tylenol), Serum 01/26/2021 <10 (A) 10 - 30 ug/mL Final   Comment: (NOTE) Therapeutic concentrations vary significantly. A range of 10-30 ug/mL  may be an effective concentration for many patients. However, some  are best treated at concentrations outside of this range. Acetaminophen concentrations >150 ug/mL at 4 hours after ingestion  and >50 ug/mL at 12 hours after ingestion are often associated with  toxic reactions.  Performed at [facility], [address], [place], [place] [address]     Blood Alcohol level:  Lab Results  Component Value Date   [facility] <10 03/30/2021   ETH <10 01/26/2021    Metabolic Disorder Labs: Lab Results  Component Value Date   HGBA1C 5.8 (H) 04/02/2021   MPG 119.76 04/02/2021   No results found for: PROLACTIN Lab Results  Component Value Date   CHOL 190 04/02/2021   TRIG 66 04/02/2021   HDL 78 04/02/2021   CHOLHDL 2.4 04/02/2021   VLDL 13 04/02/2021   LDLCALC 99 04/02/2021    Therapeutic Lab Levels: No results found for: LITHIUM No results found for: VALPROATE No components found for:  CBMZ  Physical Findings   AIMS    Flowsheet Row [facility]  AIMS Total Score 0      AUDIT    Flowsheet Row  [facility]  Alcohol Use Disorder Identification Test Final Score (AUDIT) 3      PHQ2-9    Flowsheet Row ED from 03/30/2021 in [facility] EMERGENCY DEPART[facility] ED from 05/25/2020 in [facility] EMERGENCY DEPARTMENT  PHQ-2 Total Score 4 6  PHQ-9 Total Score 14 25  Flowsheet Row ED from 04/01/2021 in [facility] ED from 03/30/2021 in [facility] EMERGENCY DEPARTMENT ED from 01/26/2021 in [facility] EMERGENCY DEPARTMENT  C-SSRS RISK CATEGORY Low Risk High Risk High Risk        Musculoskeletal  Strength & Muscle Tone: within normal limits Gait & Station: normal Patient leans: N/A  Psychiatric Specialty Exam  Presentation  General Appearance: Appropriate for Environment  Eye Contact:Good  Speech:Clear and Coherent  Speech Volume:Normal  Handedness:Right   Mood and Affect  Mood:Dysphoric  Affect:Congruent   Thought Process  Thought Processes:Coherent; Goal Directed  Descriptions of Associations:Intact  Orientation:Full (Time, Place and Person)  Thought Content:Logical; WDL  Diagnosis of Schizophrenia or Schizoaffective disorder in past: No  Duration of Psychotic Symptoms: Less than six months   Hallucinations:Hallucinations: None  Ideas of Reference:None  Suicidal Thoughts:Suicidal Thoughts: No  Homicidal Thoughts:Homicidal Thoughts: No   Sensorium  Memory:Immediate Fair; Remote Fair; Recent Fair  Judgment:Fair  Insight:Fair   Executive Functions  Concentration:Fair  Attention Span:Fair  Recall:Fair  Fund of Knowledge:Fair  Language:Fair   Psychomotor Activity  Psychomotor Activity:Psychomotor Activity: Normal   Assets  Assets:Communication Skills; Desire for Improvement   Sleep  Sleep:Sleep: Poor   Physical Exam  Physical Exam Constitutional:       Appearance: Normal appearance.  HENT:     Head: Atraumatic.     Nose: Nose normal.  Eyes:     Conjunctiva/sclera: Conjunctivae normal.  Cardiovascular:     Rate and Rhythm: Normal rate.  Musculoskeletal:        General: Normal range of motion.     Cervical back: Normal range of motion.  Neurological:     Mental Status: He is alert and oriented to person, place, and time.   Review of Systems  Constitutional: Negative.   HENT: Negative.    Eyes: Negative.   Respiratory: Negative.    Cardiovascular: Negative.   Gastrointestinal: Negative.   Genitourinary: Negative.   Musculoskeletal: Negative.   Skin: Negative.   Neurological: Negative.   Endo/Heme/Allergies: Negative.   Psychiatric/Behavioral:  Positive for depression. The patient has insomnia.   Blood pressure 99/67, pulse 66, temperature 98.6 F (37 C), temperature source Oral, resp. rate 16, SpO2 99 %. There is no height or weight on file to calculate BMI.   Treatment Plan Summary: 60 year old man with a history of cocaine use, substance-induced mood disorder, MDD who presented to [facility] with suicidal ideations as well as reporting that he had attempted suicide via overdose on cocaine on 10/9.  Patient also reported prior to presentation that he attempted to grab his sister's gun; however, his sister intervened and recommended that he go to the hospital for assessment.  Patient was transferred from [facility], ED to [facility] on 10/11  for further treatment. Patient tolerated increased dose of zoloft well. Patient denies active SI, plan or intent today. Patient made appropriate for [facility] at this time for further  mood stabilization.   MDD SIMD -continue zoloft to 50 mg -could consider seroquel in the future, will defer for now as a1c is elevated and patient is not reporting current cravings -previously prescribed and could assist with cocaine use/cravings  Insomnia -increase trazodone to 100 mg pg QHS   Cocaine use  disorder -UDS+cocaine -pt expresses interest in substance use treatment-will consult with SW for assistance   Healthcare maintenance - TSH 0.534 (wnl) -a1c 5.8 (mildly elevated) -lipid panel wnl   Dispo: ongoing. likely residential substance  use treatment; SW consulted for assistance. Likely discharge on or before Monday.  Barnett Elzey L, NP 04/05/2021 8:11 AM

## 2021-04-05 NOTE — ED Notes (Signed)
Pt eating lunch and interacting with peers. No acute distress noted. Safety maintained. ?

## 2021-04-05 NOTE — ED Notes (Signed)
Snacks given 

## 2021-04-05 NOTE — Group Note (Signed)
Group Topic: Social Support  Group Date: 04/05/2021 Start Time: 1000 End Time: 1040 Facilitators: Levander Campion  Department: [facility]  Number of Participants: 3  Group Focus: anger management Treatment Modality:  Behavior Modification Therapy Interventions utilized were patient education Purpose: enhance coping skills  Name: Richard Moon Date of Birth: 04-18-1961  MR: [id number]    Level of Participation: active Quality of Participation: cooperative Interactions with others: gave feedback Mood/Affect: appropriate Triggers (if applicable): n/a Cognition: coherent/clear Progress: Minimal Response: n/a Plan: follow-up needed  Patients Problems:  Patient Active Problem List   Diagnosis Date Noted   Substance induced mood disorder (HCC) 04/01/2021   Polysubstance abuse (HCC) 01/28/2021   Suicidal ideations 01/28/2021   Suicidal ideation    Cocaine-induced mood disorder (HCC)    Cocaine dependence with cocaine-induced mood disorder (HCC)    MDD (major depressive disorder), severe (HCC) 03/12/2018

## 2021-04-05 NOTE — ED Notes (Signed)
Patient A&Ox4. Denies intent to harm self/others when asked. Denies A/VH. Patient denies any physical complaints when asked. No acute distress noted. Routine safety checks conducted according to facility protocol. Encouraged patient to notify staff if thoughts of harm toward self or others arise. Patient verbalize understanding and agreement. Patient verbally contracts for safety. Will continue to monitor.

## 2021-04-05 NOTE — ED Notes (Signed)
Pt in dayroom talking with peer in no acute distress. Safety maintained.

## 2021-04-05 NOTE — ED Notes (Signed)
Pt sleeping in no acute distress. RR even and unlabored. Safety maintained. 

## 2021-04-05 NOTE — ED Notes (Signed)
Patient is resting quietly in bed with eyes closed, no S/S of distress. Respirations are even and unlabored, will continue to monitor for safety.

## 2021-04-05 NOTE — ED Notes (Signed)
Patient in group interacting well with others. No c/o at this time. No sxs of distress . Denies SI, HI or AVH at this time. Will continue to monitor for safety

## 2021-04-06 DIAGNOSIS — F1994 Other psychoactive substance use, unspecified with psychoactive substance-induced mood disorder: Secondary | ICD-10-CM | POA: Diagnosis not present

## 2021-04-06 DIAGNOSIS — F332 Major depressive disorder, recurrent severe without psychotic features: Secondary | ICD-10-CM | POA: Diagnosis not present

## 2021-04-06 DIAGNOSIS — F1090 Alcohol use, unspecified, uncomplicated: Secondary | ICD-10-CM | POA: Diagnosis not present

## 2021-04-06 DIAGNOSIS — F1490 Cocaine use, unspecified, uncomplicated: Secondary | ICD-10-CM | POA: Diagnosis not present

## 2021-04-06 NOTE — ED Notes (Signed)
GIVEN BREAKFAST  

## 2021-04-06 NOTE — ED Notes (Signed)
Patient resting with no sxs of distress noted - will continue to monitor for safety 

## 2021-04-06 NOTE — ED Notes (Signed)
Patient resting in room. Monitoring continues.

## 2021-04-06 NOTE — ED Notes (Signed)
Patient alert and verbal. Patient denies SI/HI and A/V/H. Patient voices no concerns. Patient is out in milieu. Patient interacts with peers and staff. Monitoring continues.

## 2021-04-06 NOTE — ED Notes (Signed)
Pt asleep in bed. Respirations even and unlabored. Will continue to monitor for safety. ?

## 2021-04-06 NOTE — ED Notes (Signed)
GIVEN LUNCH 

## 2021-04-06 NOTE — ED Notes (Signed)
GIVEN DINNER  °

## 2021-04-06 NOTE — ED Notes (Signed)
Pt sitting in dining room watching TV. Pt A&O x4, calm and cooperative. Appropriately interacting with peer. Pt denies current SI/HI/AVH. Pt denies any current needs. No signs of acute distress noted. Will continue to monitor for safety.

## 2021-04-06 NOTE — Group Note (Signed)
Group Topic: Healthy Self Image and Positive Change  Group Date: 04/06/2021 Start Time: 1500 End Time: 1614 Facilitators: Juluis Pitch P  Department: [facility]  Number of Participants: 2  Group Focus: acceptance, affirmation, check in, communication, feeling awareness/expression, personal responsibility, self-awareness, and self-esteem Treatment Modality:  Skills Training Interventions utilized were assignment, clarification, confrontation, exploration, group exercise, patient education, and problem solving Purpose: enhance coping skills, regain self-worth, and reinforce self-care  Name: Kaled Allende Date of Birth: 1960/07/07  MR: [id number]    Level of Participation: active Quality of Participation: attentive, cooperative, motivated, and supportive Interactions with others: gave feedback Mood/Affect: appropriate and positive Triggers (if applicable): n/a Cognition: coherent/clear and insightful Progress: Moderate Response: working on taking things slow. Being more patient. Engaging with others more for support. Plan: follow-up needed  Patients Problems:  Patient Active Problem List   Diagnosis Date Noted   Substance induced mood disorder (HCC) 04/01/2021   Polysubstance abuse (HCC) 01/28/2021   Suicidal ideations 01/28/2021   Suicidal ideation    Cocaine-induced mood disorder (HCC)    Cocaine dependence with cocaine-induced mood disorder (HCC)    MDD (major depressive disorder), severe (HCC) 03/12/2018

## 2021-04-06 NOTE — ED Notes (Signed)
Patient out in milieu interacting with peers and staff. Patient enjoying television with peer. Monitoring continues.

## 2021-04-06 NOTE — ED Provider Notes (Signed)
Behavioral Health Progress Note  Date and Time: 04/06/2021 8:04 AM Name: Richard Moon MRN:  [id number]  Subjective:  60 year old man with a history of cocaine use, substance-induced mood disorder, MDD who presented to [facility] with suicidal ideations as well as reporting that he had attempted suicide via overdose on cocaine on 10/9.  Patient also reported prior to presentation that he attempted to grab his sister's gun; however, his sister intervened and recommended that he go to the hospital for assessment. Patient was transferred from [facility], ED to [facility] on 10/11  for further treatment.  Patient tolerated increased dose of zoloft well. Patient denies active SI, plan or intent today.  Patient made appropriate for [facility] at this time for further  mood stabilization    Patient seen and reevaluated face-to-face by this provider and chart reviewed. On evaluation, patient is alert and oriented x4. His thought process is logical and speech is clear and coherent. His mood is dysphoric and affect is congruent. He reports feeling "okay" today. He reports feeling less depressed and states that his mood is improving. He denies SI/HI. He denies AVH but states that he was seeing things last night and thought someone was in his room. He states that the previous night he thought someone came in his room and tapped him. He thoughts feeling paranoid. He reports good sleep last night. He reports good appetite. He is compliant with taking the prescribed medications and denies medication side effects. He reports that he still interested in going to residential substance abuse treatment.    Diagnosis:  Final diagnoses:  Substance induced mood disorder (HCC)  MDD (major depressive disorder), severe (HCC)    Total Time spent with patient: 15 minutes  Past Psychiatric History: Previous Medication Trials: patient unable to recall medications; per chart review, has been on zoloft and seroquel in the past Previous  Psychiatric Hospitalizations: yes, [facility] in 2019 Previous Suicide Attempts: yes, states he attempted just prior to presentation via cocaine OD History of Violence: patient did not provide answer Outpatient psychiatrist: no  Past Medical History: No past medical history on file.  Past Surgical History:  Procedure Laterality Date   HERNIA REPAIR      Family Psychiatric  History: Daughter completed suicide in 2012  Social History:  Social History   Substance and Sexual Activity  Alcohol Use Yes   Comment: Occasionally      Social History   Substance and Sexual Activity  Drug Use Yes   Types: Marijuana, Cocaine, Heroin    Social History   Socioeconomic History   Marital status: Single    Spouse name: Not on file   Number of children: Not on file   Years of education: Not on file   Highest education level: Not on file  Occupational History   Not on file  Tobacco Use   Smoking status: Some Days    Packs/day: 0.25    Years: 20.00    Pack years: 5.00    Types: Cigarettes   Smokeless tobacco: Never  Vaping Use   Vaping Use: Every day  Substance and Sexual Activity   Alcohol use: Yes    Comment: Occasionally    Drug use: Yes    Types: Marijuana, Cocaine, Heroin   Sexual activity: Yes    Birth control/protection: Condom  Other Topics Concern   Not on file  Social History Narrative   Not on file   Social Determinants of Health   Financial Resource Strain: Not on file  Food Insecurity: Not on file  Transportation Needs: Not on file  Physical Activity: Not on file  Stress: Not on file  Social Connections: Not on file   SDOH:  SDOH Screenings   Alcohol Screen: Not on file  Depression (PHQ2-9): Medium Risk   PHQ-2 Score: 14  Financial Resource Strain: Not on file  Food Insecurity: Not on file  Housing: Not on file  Physical Activity: Not on file  Social Connections: Not on file  Stress: Not on file  Tobacco Use: High Risk   Smoking Tobacco Use: Some Days    Smokeless Tobacco Use: Never  Transportation Needs: Not on file   Current Medications:  Current Facility-Administered Medications  Medication Dose Route Frequency Provider Last Rate Last Admin   acetaminophen (TYLENOL) tablet 650 mg  650 mg Oral Q6H PRN Estella Husk, MD   650 mg at 04/05/21 2228   alum & mag hydroxide-simeth (MAALOX/MYLANTA) 200-200-20 MG/5ML suspension 30 mL  30 mL Oral Q4H PRN Estella Husk, MD   30 mL at 04/05/21 2141   hydrOXYzine (ATARAX/[name]) tablet 25 mg  25 mg Oral TID PRN Estella Husk, MD   25 mg at 04/05/21 2141   magnesium hydroxide (MILK OF MAGNESIA) suspension 30 mL  30 mL Oral Daily PRN Estella Husk, MD   30 mL at 04/02/21 2129   sertraline (ZOLOFT) tablet 50 mg  50 mg Oral Daily Estella Husk, MD   50 mg at 04/05/21 1001   traZODone (DESYREL) tablet 100 mg  100 mg Oral QHS PRN Michaelle Bottomley L, NP       Current Outpatient Medications  Medication Sig Dispense Refill   aspirin 325 MG tablet Take 325 mg by mouth daily as needed for headache.     ibuprofen (ADVIL) 100 MG tablet Take 400 mg by mouth every 6 (six) hours as needed (headache).      Labs  Lab Results:  Admission on 04/01/2021  Component Date Value Ref Range Status   TSH 04/02/2021 0.534  0.350 - 4.500 uIU/mL Final   Comment: Performed by a 3rd Generation assay with a functional sensitivity of <=0.01 uIU/mL. Performed at [facility], [address] [place], [place] [address]    Hgb A1c MFr Bld 04/02/2021 5.8 (A) 4.8 - 5.6 % Final   Comment: (NOTE) Pre diabetes:          5.7%-6.4%  Diabetes:              >6.4%  Glycemic control for   <7.0% adults with diabetes    Mean Plasma Glucose 04/02/2021 119.76  mg/dL Final   Performed at [facility], [address], [place], [place] [address]   Cholesterol 04/02/2021 190  0 - 200 mg/dL Final   Triglycerides 76/81/1572 66  <150 mg/dL Final   HDL 62/08/5595 78  >40 mg/dL Final   Total  CHOL/HDL Ratio 04/02/2021 2.4  RATIO Final   VLDL 04/02/2021 13  0 - 40 mg/dL Final   LDL Cholesterol 04/02/2021 99  0 - 99 mg/dL Final   Comment:        Total Cholesterol/HDL:CHD Risk Coronary Heart Disease Risk Table                     Men   Women  1/2 Average Risk   3.4   3.3  Average Risk       5.0   4.4  2 X Average Risk  9.6   7.1  3 X Average Risk  23.4   11.0        Use the calculated Patient Ratio above and the CHD Risk Table to determine the patient's CHD Risk.        ATP III CLASSIFICATION (LDL):  <100     mg/dL   Optimal  161-096  mg/dL   Near or Above                    Optimal  130-159  mg/dL   Borderline  045-409  mg/dL   High  >811     mg/dL   Very High Performed at [facility], [address], [place], [place] [address]   Admission on 03/30/2021, Discharged on 04/01/2021  Component Date Value Ref Range Status   SARS Coronavirus 2 by RT PCR 03/31/2021 NEGATIVE  NEGATIVE Final   Comment: (NOTE) SARS-CoV-2 target nucleic acids are NOT DETECTED.  The SARS-CoV-2 RNA is generally detectable in upper respiratory specimens during the acute phase of infection. The lowest concentration of SARS-CoV-2 viral copies this assay can detect is 138 copies/mL. A negative result does not preclude SARS-Cov-2 infection and should not be used as the sole basis for treatment or other patient management decisions. A negative result may occur with  improper specimen collection/handling, submission of specimen other than nasopharyngeal swab, presence of viral mutation(s) within the areas targeted by this assay, and inadequate number of viral copies(<138 copies/mL). A negative result must be combined with clinical observations, patient history, and epidemiological information. The expected result is Negative.  Fact Sheet for Patients:  BloggerCourse.com  Fact Sheet for Healthcare Providers:  SeriousBroker.it  This test  is no                          t yet approved or cleared by the [place] FDA and  has been authorized for detection and/or diagnosis of SARS-CoV-2 by FDA under an Emergency Use Authorization (EUA). This EUA will remain  in effect (meaning this test can be used) for the duration of the COVID-19 declaration under Section 564(b)(1) of the Act, 21 U.S.C.section 360bbb-3(b)(1), unless the authorization is terminated  or revoked sooner.       Influenza A by PCR 03/31/2021 NEGATIVE  NEGATIVE Final   Influenza B by PCR 03/31/2021 NEGATIVE  NEGATIVE Final   Comment: (NOTE) The Xpert Xpress SARS-CoV-2/FLU/RSV plus assay is intended as an aid in the diagnosis of influenza from Nasopharyngeal swab specimens and should not be used as a sole basis for treatment. Nasal washings and aspirates are unacceptable for Xpert Xpress SARS-CoV-2/FLU/RSV testing.  Fact Sheet for Patients: BloggerCourse.com  Fact Sheet for Healthcare Providers: SeriousBroker.it  This test is not yet approved or cleared by the [place] FDA and has been authorized for detection and/or diagnosis of SARS-CoV-2 by FDA under an Emergency Use Authorization (EUA). This EUA will remain in effect (meaning this test can be used) for the duration of the COVID-19 declaration under Section 564(b)(1) of the Act, 21 U.S.C. section 360bbb-3(b)(1), unless the authorization is terminated or revoked.  Performed at [facility], [address], [place], [place] [address]    Sodium 03/30/2021 142  135 - 145 mmol/L Final   Potassium 03/30/2021 3.8  3.5 - 5.1 mmol/L Final   Chloride 03/30/2021 107  98 - 111 mmol/L Final   CO2 03/30/2021 25  22 - 32 mmol/L Final  Glucose, Bld 03/30/2021 96  70 - 99 mg/dL Final   Glucose reference range applies only to samples taken after fasting for at least 8 hours.   BUN 03/30/2021 14  6 - 20 mg/dL Final   Creatinine, Ser 03/30/2021 1.45  (A) 0.61 - 1.24 mg/dL Final   Calcium 50/93/2671 8.5 (A) 8.9 - 10.3 mg/dL Final   Total Protein 24/58/0998 5.7 (A) 6.5 - 8.1 g/dL Final   Albumin 33/82/5053 3.4 (A) 3.5 - 5.0 g/dL Final   AST 97/67/3419 43 (A) 15 - 41 U/L Final   ALT 03/30/2021 24  0 - 44 U/L Final   Alkaline Phosphatase 03/30/2021 53  38 - 126 U/L Final   Total Bilirubin 03/30/2021 0.7  0.3 - 1.2 mg/dL Final   GFR, Estimated 03/30/2021 56 (A) >60 mL/min Final   Comment: (NOTE) Calculated using the CKD-EPI Creatinine Equation (2021)    Anion gap 03/30/2021 10  5 - 15 Final   Performed at [facility], [address], [place], [place] [address]   Alcohol, Ethyl (B) 03/30/2021 <10  <10 mg/dL Final   Comment: (NOTE) Lowest detectable limit for serum alcohol is 10 mg/dL.  For medical purposes only. Performed at [facility], [address], [place], [place] [address]    Opiates 03/31/2021 NONE DETECTED  NONE DETECTED Final   Cocaine 03/31/2021 POSITIVE (A) NONE DETECTED Final   Benzodiazepines 03/31/2021 NONE DETECTED  NONE DETECTED Final   Amphetamines 03/31/2021 NONE DETECTED  NONE DETECTED Final   Tetrahydrocannabinol 03/31/2021 NONE DETECTED  NONE DETECTED Final   Barbiturates 03/31/2021 NONE DETECTED  NONE DETECTED Final   Comment: (NOTE) DRUG SCREEN FOR MEDICAL PURPOSES ONLY.  IF CONFIRMATION IS NEEDED FOR ANY PURPOSE, NOTIFY LAB WITHIN 5 DAYS.  LOWEST DETECTABLE LIMITS FOR URINE DRUG SCREEN Drug Class                     Cutoff (ng/mL) Amphetamine and metabolites    1000 Barbiturate and metabolites    200 Benzodiazepine                 200 Tricyclics and metabolites     300 Opiates and metabolites        300 Cocaine and metabolites        300 THC                            50 Performed at [facility], [address], [place], [place] [address]    WBC 03/30/2021 5.4  4.0 - 10.5 K/uL Final   RBC 03/30/2021 4.52  4.22 - 5.81 MIL/uL Final   Hemoglobin 03/30/2021 15.0  13.0 - 17.0  g/dL Final   HCT 24/26/8341 45.9  39.0 - 52.0 % Final   MCV 03/30/2021 101.5 (A) 80.0 - 100.0 fL Final   MCH 03/30/2021 33.2  26.0 - 34.0 pg Final   MCHC 03/30/2021 32.7  30.0 - 36.0 g/dL Final   RDW 96/22/2979 13.4  11.5 - 15.5 % Final   Platelets 03/30/2021 218  150 - 400 K/uL Final   nRBC 03/30/2021 0.0  0.0 - 0.2 % Final   Neutrophils Relative % 03/30/2021 37  % Final   Neutro Abs 03/30/2021 2.0  1.7 - 7.7 K/uL Final   Lymphocytes Relative 03/30/2021 44  % Final   Lymphs Abs 03/30/2021 2.4  0.7 - 4.0 K/uL Final   Monocytes Relative 03/30/2021  13  % Final   Monocytes Absolute 03/30/2021 0.7  0.1 - 1.0 K/uL Final   Eosinophils Relative 03/30/2021 6  % Final   Eosinophils Absolute 03/30/2021 0.4  0.0 - 0.5 K/uL Final   Basophils Relative 03/30/2021 0  % Final   Basophils Absolute 03/30/2021 0.0  0.0 - 0.1 K/uL Final   Immature Granulocytes 03/30/2021 0  % Final   Abs Immature Granulocytes 03/30/2021 0.02  0.00 - 0.07 K/uL Final   Performed at [facility], [address], [place], [place] [address]   Acetaminophen (Tylenol), Serum 03/30/2021 <10 (A) 10 - 30 ug/mL Final   Comment: (NOTE) Therapeutic concentrations vary significantly. A range of 10-30 ug/mL  may be an effective concentration for many patients. However, some  are best treated at concentrations outside of this range. Acetaminophen concentrations >150 ug/mL at 4 hours after ingestion  and >50 ug/mL at 12 hours after ingestion are often associated with  toxic reactions.  Performed at [facility], [address] [place], [place] [address]    Salicylate Lvl 03/30/2021 <7.0 (A) 7.0 - 30.0 mg/dL Final   Performed at [facility], [address], [place], [place] [address]  Admission on 01/26/2021, Discharged on 01/29/2021  Component Date Value Ref Range Status   SARS Coronavirus 2 by RT PCR 01/27/2021 NEGATIVE  NEGATIVE Final   Comment: (NOTE) SARS-CoV-2 target nucleic acids are NOT DETECTED.  The  SARS-CoV-2 RNA is generally detectable in upper respiratory specimens during the acute phase of infection. The lowest concentration of SARS-CoV-2 viral copies this assay can detect is 138 copies/mL. A negative result does not preclude SARS-Cov-2 infection and should not be used as the sole basis for treatment or other patient management decisions. A negative result may occur with  improper specimen collection/handling, submission of specimen other than nasopharyngeal swab, presence of viral mutation(s) within the areas targeted by this assay, and inadequate number of viral copies(<138 copies/mL). A negative result must be combined with clinical observations, patient history, and epidemiological information. The expected result is Negative.  Fact Sheet for Patients:  BloggerCourse.com  Fact Sheet for Healthcare Providers:  SeriousBroker.it  This test is no                          t yet approved or cleared by the [place] FDA and  has been authorized for detection and/or diagnosis of SARS-CoV-2 by FDA under an Emergency Use Authorization (EUA). This EUA will remain  in effect (meaning this test can be used) for the duration of the COVID-19 declaration under Section 564(b)(1) of the Act, 21 U.S.C.section 360bbb-3(b)(1), unless the authorization is terminated  or revoked sooner.       Influenza A by PCR 01/27/2021 NEGATIVE  NEGATIVE Final   Influenza B by PCR 01/27/2021 NEGATIVE  NEGATIVE Final   Comment: (NOTE) The Xpert Xpress SARS-CoV-2/FLU/RSV plus assay is intended as an aid in the diagnosis of influenza from Nasopharyngeal swab specimens and should not be used as a sole basis for treatment. Nasal washings and aspirates are unacceptable for Xpert Xpress SARS-CoV-2/FLU/RSV testing.  Fact Sheet for Patients: BloggerCourse.com  Fact Sheet for Healthcare  Providers: SeriousBroker.it  This test is not yet approved or cleared by the [place] FDA and has been authorized for detection and/or diagnosis of SARS-CoV-2 by FDA under an Emergency Use Authorization (EUA). This EUA will remain in effect (meaning this test can be used) for  the duration of the COVID-19 declaration under Section 564(b)(1) of the Act, 21 U.S.C. section 360bbb-3(b)(1), unless the authorization is terminated or revoked.  Performed at [facility], [address] [place], [place] [address]    Sodium 01/26/2021 139  135 - 145 mmol/L Final   Potassium 01/26/2021 3.7  3.5 - 5.1 mmol/L Final   Chloride 01/26/2021 109  98 - 111 mmol/L Final   CO2 01/26/2021 23  22 - 32 mmol/L Final   Glucose, Bld 01/26/2021 124 (A) 70 - 99 mg/dL Final   Glucose reference range applies only to samples taken after fasting for at least 8 hours.   BUN 01/26/2021 15  6 - 20 mg/dL Final   Creatinine, Ser 01/26/2021 1.49 (A) 0.61 - 1.24 mg/dL Final   Calcium 37/03/6268 8.6 (A) 8.9 - 10.3 mg/dL Final   Total Protein 48/54/6270 5.1 (A) 6.5 - 8.1 g/dL Final   Albumin 35/00/9381 3.2 (A) 3.5 - 5.0 g/dL Final   AST 82/99/3716 38  15 - 41 U/L Final   ALT 01/26/2021 22  0 - 44 U/L Final   Alkaline Phosphatase 01/26/2021 41  38 - 126 U/L Final   Total Bilirubin 01/26/2021 0.5  0.3 - 1.2 mg/dL Final   GFR, Estimated 01/26/2021 54 (A) >60 mL/min Final   Comment: (NOTE) Calculated using the CKD-EPI Creatinine Equation (2021)    Anion gap 01/26/2021 7  5 - 15 Final   Performed at [facility], [address] [place], [place] [address]   Alcohol, Ethyl (B) 01/26/2021 <10  <10 mg/dL Final   Comment: (NOTE) Lowest detectable limit for serum alcohol is 10 mg/dL.  For medical purposes only. Performed at [facility], [address] [place], [place] [address]    Opiates 01/27/2021 NONE DETECTED  NONE DETECTED Final   Cocaine 01/27/2021 POSITIVE (A) NONE  DETECTED Final   Benzodiazepines 01/27/2021 NONE DETECTED  NONE DETECTED Final   Amphetamines 01/27/2021 NONE DETECTED  NONE DETECTED Final   Tetrahydrocannabinol 01/27/2021 NONE DETECTED  NONE DETECTED Final   Barbiturates 01/27/2021 NONE DETECTED  NONE DETECTED Final   Comment: (NOTE) DRUG SCREEN FOR MEDICAL PURPOSES ONLY.  IF CONFIRMATION IS NEEDED FOR ANY PURPOSE, NOTIFY LAB WITHIN 5 DAYS.  LOWEST DETECTABLE LIMITS FOR URINE DRUG SCREEN Drug Class                     Cutoff (ng/mL) Amphetamine and metabolites    1000 Barbiturate and metabolites    200 Benzodiazepine                 200 Tricyclics and metabolites     300 Opiates and metabolites        300 Cocaine and metabolites        300 THC                            50 Performed at [facility], [address] [place], [place] [address]    WBC 01/26/2021 4.5  4.0 - 10.5 K/uL Final   RBC 01/26/2021 4.17 (A) 4.22 - 5.81 MIL/uL Final   Hemoglobin 01/26/2021 14.2  13.0 - 17.0 g/dL Final   HCT 58/52/7782 42.1  39.0 - 52.0 % Final   MCV 01/26/2021 101.0 (A) 80.0 - 100.0 fL Final   MCH 01/26/2021 34.1 (A) 26.0 - 34.0 pg Final   MCHC 01/26/2021 33.7  30.0 - 36.0  g/dL Final   RDW 56/43/3295 13.5  11.5 - 15.5 % Final   Platelets 01/26/2021 180  150 - 400 K/uL Final   nRBC 01/26/2021 0.0  0.0 - 0.2 % Final   Neutrophils Relative % 01/26/2021 48  % Final   Neutro Abs 01/26/2021 2.1  1.7 - 7.7 K/uL Final   Lymphocytes Relative 01/26/2021 39  % Final   Lymphs Abs 01/26/2021 1.8  0.7 - 4.0 K/uL Final   Monocytes Relative 01/26/2021 8  % Final   Monocytes Absolute 01/26/2021 0.4  0.1 - 1.0 K/uL Final   Eosinophils Relative 01/26/2021 5  % Final   Eosinophils Absolute 01/26/2021 0.2  0.0 - 0.5 K/uL Final   Basophils Relative 01/26/2021 0  % Final   Basophils Absolute 01/26/2021 0.0  0.0 - 0.1 K/uL Final   Immature Granulocytes 01/26/2021 0  % Final   Abs Immature Granulocytes 01/26/2021 0.01  0.00 - 0.07 K/uL Final    Performed at [facility], [address], [place], [place] [address]   Salicylate Lvl 01/26/2021 <7.0 (A) 7.0 - 30.0 mg/dL Final   Performed at [facility], [address] [place], [place] [address]   Acetaminophen (Tylenol), Serum 01/26/2021 <10 (A) 10 - 30 ug/mL Final   Comment: (NOTE) Therapeutic concentrations vary significantly. A range of 10-30 ug/mL  may be an effective concentration for many patients. However, some  are best treated at concentrations outside of this range. Acetaminophen concentrations >150 ug/mL at 4 hours after ingestion  and >50 ug/mL at 12 hours after ingestion are often associated with  toxic reactions.  Performed at [facility], [address], [place], [place] [address]     Blood Alcohol level:  Lab Results  Component Value Date   [facility] <10 03/30/2021   ETH <10 01/26/2021    Metabolic Disorder Labs: Lab Results  Component Value Date   HGBA1C 5.8 (H) 04/02/2021   MPG 119.76 04/02/2021   No results found for: PROLACTIN Lab Results  Component Value Date   CHOL 190 04/02/2021   TRIG 66 04/02/2021   HDL 78 04/02/2021   CHOLHDL 2.4 04/02/2021   VLDL 13 04/02/2021   LDLCALC 99 04/02/2021    Therapeutic Lab Levels: No results found for: LITHIUM No results found for: VALPROATE No components found for:  CBMZ  Physical Findings   AIMS    Flowsheet Row [facility]  AIMS Total Score 0      AUDIT    Flowsheet Row [facility]  Alcohol Use Disorder Identification Test Final Score (AUDIT) 3      PHQ2-9    Flowsheet Row ED from 03/30/2021 in [facility] ED from 05/25/2020 in [facility] EMERGENCY DEPARTMENT  PHQ-2 Total Score 4 6  PHQ-9 Total Score 14 25      Flowsheet Row ED from 04/01/2021 in [facility] ED from 03/30/2021 in [facility] EMERGENCY DEPARTMENT ED from 01/26/2021 in [facility] EMERGENCY DEPARTMENT  C-SSRS RISK CATEGORY Low Risk High Risk High Risk        Musculoskeletal  Strength & Muscle Tone: within normal limits Gait & Station: normal Patient leans: N/A  Psychiatric Specialty Exam  Presentation  General Appearance: Appropriate for Environment  Eye Contact:Fair  Speech:Clear and Coherent  Speech Volume:Normal  Handedness:Right  Mood and Affect  Mood:Dysphoric  Affect:Congruent   Thought Process  Thought Processes:Coherent; Goal Directed  Descriptions of Associations:Intact  Orientation:Full (Time, Place and Person)  Thought Content:Logical  Diagnosis of Schizophrenia or Schizoaffective disorder in past: No  Duration of Psychotic Symptoms: Less than six months   Hallucinations:Hallucinations: None  Ideas of Reference:None  Suicidal Thoughts:Suicidal Thoughts: No  Homicidal Thoughts:Homicidal Thoughts: No   Sensorium  Memory:Immediate Fair; Remote Fair; Recent Fair  Judgment:Fair  Insight:Fair   Executive Functions  Concentration:Fair  Attention Span:Fair  Recall:Fair  Fund of Knowledge:Fair  Language:Fair   Psychomotor Activity  Psychomotor Activity:Psychomotor Activity: Normal   Assets  Assets:Communication Skills; Desire for Improvement   Sleep  Sleep:Sleep: Fair   No data recorded  Physical Exam  Physical Exam Constitutional:      Appearance: Normal appearance.  HENT:     Head: Atraumatic.     Nose: Nose normal.  Eyes:     Conjunctiva/sclera: Conjunctivae normal.  Cardiovascular:     Rate and Rhythm: Normal rate.  Pulmonary:     Effort: Pulmonary effort is normal.  Musculoskeletal:        General: Normal range of motion.     Cervical back: Normal range of motion.  Neurological:     Mental Status: He is alert and oriented to person, place, and  time.   Review of Systems  Constitutional: Negative.   HENT: Negative.    Eyes: Negative.   Respiratory: Negative.    Cardiovascular: Negative.   Gastrointestinal: Negative.   Genitourinary: Negative.   Musculoskeletal: Negative.   Skin: Negative.   Neurological: Negative.   Endo/Heme/Allergies: Negative.   Blood pressure 106/76, pulse 72, temperature 98 F (36.7 C), resp. rate 16, SpO2 99 %. There is no height or weight on file to calculate BMI.  Treatment Plan Summary: 61 year old man with a history of cocaine use, substance-induced mood disorder, MDD who presented to [facility] with suicidal ideations as well as reporting that he had attempted suicide via overdose on cocaine on 10/9.  Patient also reported prior to presentation that he attempted to grab his sister's gun; however, his sister intervened and recommended that he go to the hospital for assessment.  Patient was transferred from [facility], ED to [facility] on 10/11  for further treatment. Patient tolerated increased dose of zoloft well. Patient denies active SI, plan or intent today. Patient made appropriate for [facility] at this time for further  mood stabilization.   MDD SIMD -continue zoloft to 50 mg -could consider seroquel in the future, will defer for now as a1c is elevated and patient is not reporting current cravings -previously prescribed and could assist with cocaine use/cravings   Insomnia - trazodone to 100 mg pg QHS   Cocaine use disorder -UDS+cocaine -pt expresses interest in substance use treatment-will consult with SW for assistance   Healthcare maintenance - TSH 0.534 (wnl) -a1c 5.8 (mildly elevated) -lipid panel wnl   Dispo: ongoing. likely residential substance use treatment; SW consulted for assistance. Likely discharge on or before Monday.    Elizjah Noblet L, NP 04/06/2021 8:04 AM

## 2021-04-07 ENCOUNTER — Telehealth (HOSPITAL_COMMUNITY): Payer: Self-pay | Admitting: Emergency Medicine

## 2021-04-07 DIAGNOSIS — F1090 Alcohol use, unspecified, uncomplicated: Secondary | ICD-10-CM | POA: Diagnosis not present

## 2021-04-07 DIAGNOSIS — F1994 Other psychoactive substance use, unspecified with psychoactive substance-induced mood disorder: Secondary | ICD-10-CM | POA: Diagnosis not present

## 2021-04-07 DIAGNOSIS — F332 Major depressive disorder, recurrent severe without psychotic features: Secondary | ICD-10-CM | POA: Diagnosis not present

## 2021-04-07 DIAGNOSIS — F1490 Cocaine use, unspecified, uncomplicated: Secondary | ICD-10-CM | POA: Diagnosis not present

## 2021-04-07 MED ORDER — TRAZODONE HCL 100 MG PO TABS: 100.0000 mg | ORAL_TABLET | Freq: Every evening | ORAL | 1 refills | Status: DC | PRN

## 2021-04-07 MED ORDER — HYDROXYZINE HCL 25 MG PO TABS: 25.0000 mg | ORAL_TABLET | Freq: Three times a day (TID) | ORAL | 0 refills | Status: DC | PRN

## 2021-04-07 MED ORDER — TRAZODONE HCL 100 MG PO TABS: 100.0000 mg | ORAL_TABLET | Freq: Every evening | ORAL | 0 refills | Status: DC | PRN

## 2021-04-07 MED ORDER — SERTRALINE HCL 50 MG PO TABS: 50.0000 mg | ORAL_TABLET | Freq: Every day | ORAL | 0 refills | Status: DC

## 2021-04-07 MED ORDER — HYDROXYZINE HCL 25 MG PO TABS: 25.0000 mg | ORAL_TABLET | Freq: Three times a day (TID) | ORAL | 1 refills | Status: AC | PRN

## 2021-04-07 MED ORDER — SERTRALINE HCL 50 MG PO TABS: 50.0000 mg | ORAL_TABLET | Freq: Every day | ORAL | 1 refills | Status: DC

## 2021-04-07 NOTE — ED Provider Notes (Signed)
FBC/OBS ASAP Discharge Summary  Date and Time: 04/07/2021 1:39 PM  Name: Richard Moon  MRN:  [id number]   Discharge Diagnoses:  Final diagnoses:  Substance induced mood disorder (HCC)  MDD (major depressive disorder), severe (HCC)    Subjective:  Patient seen and chart reviewed.  Patient has been medication compliant, attending groups, has not been a management issue on the unit has been interacting with peers and staff appropriately.  Patient interviewed this morning the [place] area.  Patient reports doing okay.  He denies SI/HI/AVH.  Patient states that he would like to discharge with a friend for a couple days and then obtain transportation to [place], [place].  Patient reports some anxiety related to this season unsure how he will get to [place] after leaving this facility.  Discussed with him that there are transportation services that may be able to assist with this. Discussed with SW who provided him some resources regarding transportation.   Discussed with patient that he will be provided with 7-day samples as well as 30-day prescription with 1 refill for all medications.  Patient verbalized understanding.  Stay Summary:  60 year old man with a history of cocaine use, substance-induced mood disorder, MDD presented to [facility], initially with suicidal ideations as well as reported attempt to overdose on cocaine.  On October 10.  Patient was transferred to [facility] ED for [facility] for further treatment on October 11.  He reported desire and getting assistance with substance use.  UDS positive for cocaine.  Social work was consulted for assistance for substance use programs.  Patient was started on Zoloft 25 mg on October 12.; he tolerated medication well and  This was titrated to 50 mg on October 14.  Patient tolerated medication well throughout his stay and was maintained on this dose throughout admission.  Social work was consulted for assistance  of substance use programs; however, there was limited availability-there is no availablity at [facility] (2-week wait list), [facility] (4-week wait list).  [facility] and [facility] out of network.  [facility] did not provide response.  Patient completed screening process for [facility] on 10/17 and was accepted for Wednesday.  Patient [facility]; however, he changed his mind as he did not want to attend a program that was heavily focused on faith and decided to stay with a friend for a couple days until a bed is free at the [facility].  Patient attended groups throughout his stay, he was appropriate with peers and staff.  Patient discharged with 7-day samples and 30 days worth of medication with 1 refill.  Patient had reported intermittent hand cramping throughout stay.  Labs were within normal limits.  Advised patient to follow up with a primary care provider upon leaving the hospital to further assess and treat this issue.  Patient verbalized understanding.  Total Time spent with patient: 15 minutes  Past Psychiatric History: as per H&P Past Medical History: No past medical history on file.  Past Surgical History:  Procedure Laterality Date   HERNIA REPAIR     Family History: No family history on file. 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: as Recruitment consultant H&P Social History:  Social History   Substance and Sexual Activity  Alcohol Use Yes   Comment: Occasionally      Social History   Substance and Sexual Activity  Drug Use Yes   Types: Marijuana, Cocaine, Heroin  Social History   Socioeconomic History   Marital status: Single    Spouse name: Not on file   Number of children: Not on file   Years of education: Not on file   Highest education level: Not on file  Occupational History   Not on file  Tobacco Use   Smoking status: Some Days    Packs/day: 0.25    Years: 20.00    Pack years: 5.00    Types:  Cigarettes   Smokeless tobacco: Never  Vaping Use   Vaping Use: Every day  Substance and Sexual Activity   Alcohol use: Yes    Comment: Occasionally    Drug use: Yes    Types: Marijuana, Cocaine, Heroin   Sexual activity: Yes    Birth control/protection: Condom  Other Topics Concern   Not on file  Social History Narrative   Not on file   Social Determinants of Health   Financial Resource Strain: Not on file  Food Insecurity: Not on file  Transportation Needs: Not on file  Physical Activity: Not on file  Stress: Not on file  Social Connections: Not on file   SDOH:  SDOH Screenings   Alcohol Screen: Not on file  Depression (PHQ2-9): Medium Risk   PHQ-2 Score: 14  Financial Resource Strain: Not on file  Food Insecurity: Not on file  Housing: Not on file  Physical Activity: Not on file  Social Connections: Not on file  Stress: Not on file  Tobacco Use: High Risk   Smoking Tobacco Use: Some Days   Smokeless Tobacco Use: Never  Transportation Needs: Not on file    Tobacco Cessation:  N/A, patient does not currently use tobacco products  Current Medications:  Current Facility-Administered Medications  Medication Dose Route Frequency Provider Last Rate Last Admin   acetaminophen (TYLENOL) tablet 650 mg  650 mg Oral Q6H PRN Estella Husk, MD   650 mg at 04/05/21 2228   alum & mag hydroxide-simeth (MAALOX/MYLANTA) 200-200-20 MG/5ML suspension 30 mL  30 mL Oral Q4H PRN Estella Husk, MD   30 mL at 04/05/21 2141   hydrOXYzine (ATARAX/[name]) tablet 25 mg  25 mg Oral TID PRN Estella Husk, MD   25 mg at 04/05/21 2141   magnesium hydroxide (MILK OF MAGNESIA) suspension 30 mL  30 mL Oral Daily PRN Estella Husk, MD   30 mL at 04/02/21 2129   sertraline (ZOLOFT) tablet 50 mg  50 mg Oral Daily Estella Husk, MD   50 mg at 04/07/21 0932   traZODone (DESYREL) tablet 100 mg  100 mg Oral QHS PRN White, Patrice L, NP   100 mg at 04/06/21 2157    Current Outpatient Medications  Medication Sig Dispense Refill   hydrOXYzine (ATARAX/[name]) 25 MG tablet Take 1 tablet (25 mg total) by mouth 3 (three) times daily as needed for anxiety. 30 tablet 1   ibuprofen (ADVIL) 100 MG tablet Take 400 mg by mouth every 6 (six) hours as needed (headache).     [START ON 04/08/2021] sertraline (ZOLOFT) 50 MG tablet Take 1 tablet (50 mg total) by mouth daily. 30 tablet 1   traZODone (DESYREL) 100 MG tablet Take 1 tablet (100 mg total) by mouth at bedtime as needed for sleep. 30 tablet 1    PTA Medications: (Not in a hospital admission)   Musculoskeletal  Strength & Muscle Tone: within normal limits Gait & Station: normal Patient leans: N/A  Psychiatric Specialty Exam  Presentation  General  Appearance: Appropriate for Environment; Casual  Eye Contact:Fair  Speech:Clear and Coherent; Normal Rate  Speech Volume:Normal  Handedness:Right   Mood and Affect  Mood:Anxious  Affect:Appropriate; Congruent   Thought Process  Thought Processes:Coherent; Goal Directed; Linear  Descriptions of Associations:Intact  Orientation:Full (Time, Place and Person)  Thought Content:WDL; Logical  Diagnosis of Schizophrenia or Schizoaffective disorder in past: No  Duration of Psychotic Symptoms: Less than six months   Hallucinations:Hallucinations: None  Ideas of Reference:None  Suicidal Thoughts:Suicidal Thoughts: No  Homicidal Thoughts:Homicidal Thoughts: No   Sensorium  Memory:Immediate Good; Recent Good; Remote Good  Judgment:Good  Insight:Good   Executive Functions  Concentration:Good  Attention Span:Good  Recall:Good  Fund of Knowledge:Good  Language:Good   Psychomotor Activity  Psychomotor Activity:Psychomotor Activity: Normal   Assets  Assets:Communication Skills; Desire for Improvement   Sleep  Sleep:Sleep: Fair   No data recorded  Physical Exam   See SRA for physical Exam and ROS  See SRA for  suicide risk assessment   Blood pressure 101/64, pulse 72, temperature (!) 97.3 F (36.3 C), temperature source Oral, resp. rate 18, SpO2 98 %. There is no height or weight on file to calculate BMI.   Plan Of Care/Follow-up recommendations:  Activity:  as tolerated Diet:  regular Other:      Patient is instructed prior to discharge to: Take all medications as prescribed by his/her mental healthcare provider. Report any adverse effects and or reactions from the medicines to his/her outpatient provider promptly. Patient has been instructed & cautioned: To not engage in alcohol and or illegal drug use while on prescription medicines. In the event of worsening symptoms, patient is instructed to call the crisis hotline, 911 and or go to the nearest ED for appropriate evaluation and treatment of symptoms. To follow-up with his/her primary care provider for your other medical issues, concerns and or health care needs.        TAKE these medications    hydrOXYzine 25 MG tablet Commonly known as: ATARAX/[name] Take 1 tablet (25 mg total) by mouth 3 (three) times daily as needed for anxiety.   ibuprofen 100 MG tablet Commonly known as: ADVIL Take 400 mg by mouth every 6 (six) hours as needed (headache).   sertraline 50 MG tablet Commonly known as: ZOLOFT Take 1 tablet (50 mg total) by mouth daily. Start taking on: April 08, 2021   traZODone 100 MG tablet Commonly known as: DESYREL Take 1 tablet (100 mg total) by mouth at bedtime as needed for sleep.       7 day  samples provided discharge as well as 30-day prescription with 1 refill.  Disposition:  Self care. [facility] when a bed is free. Completed screening on day of discharge and was accepted.  Estella Husk, MD 04/07/2021, 1:39 PM

## 2021-04-07 NOTE — Progress Notes (Addendum)
Received Richard Moon this AM asleep in his bed,he woke up on his own, received breakfast and was compliant with his medications. He made several calls after talking with the social worker related to housing. He denied SI/HI. H attended the morning AA meeting.
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
## 2021-04-07 NOTE — BH Assessment (Signed)
Care Management - Follow Up [facility] Discharges   Writer attempted to make contact with patient today and was unsuccessful.  Writer left a HIPPA compliant voice message.   Per chart review, patient has been placed at the [facility] of [address] in [place], [place]

## 2021-04-07 NOTE — ED Notes (Signed)
Pt asleep in bed. Respirations even and unlabored. Will continue to monitor for safety. ?

## 2021-04-07 NOTE — Clinical Social Work Psych Note (Addendum)
CSW Discharge Note   Leilan was pleasant and cooperative this morning. He presented with a depressed affect, congruent mood, however when asked how he was doing, he responded "I'm good".  Solmon expressed that he was still concerned about his discharge planning due to lack of bed availability and resources at this time.   Windy Fast contacted [facility] in [place], [place] to determine if there was any bed availability. [facility] shared that they did have bed availability, and requested Vonnie to contact them to complete a phone screening. After completing the phone screening, [facility] was accepted.   Rodric also contacted the [facility] to determine bed availability. CRM admissions reports that if found appropriate, there will be possible beds available starting Wednesday or Thursday this week.  Tetsuo contacted CRM to complete phone screening. CRM requested a referral packet and instructed Lance to contact them following his discharge.   Rahsaan plans to stay at [facility], temporarily, until a bed at the [facility] is available.   Deronte was agreeable with his discharge plans. Dantae requested assistance with transportation. CSW submitted 75 mile form to [employer].   RN staff aware.   CSW will continue to follow until discharge.    Baldo Daub, MSW, LCSW Clinical Child psychotherapist (Facility Based Crisis) [facility]

## 2021-04-07 NOTE — Discharge Instructions (Signed)

## 2021-08-13 ENCOUNTER — Emergency Department (HOSPITAL_COMMUNITY)
Admission: EM | Admit: 2021-08-13 | Discharge: 2021-08-13 | Disposition: A | Discharge status: Home / Self Care | Payer: Self-pay | Attending: Emergency Medicine | Admitting: Emergency Medicine

## 2021-08-13 DIAGNOSIS — Z5321 Procedure and treatment not carried out due to patient leaving prior to being seen by health care provider: Secondary | ICD-10-CM | POA: Insufficient documentation

## 2021-08-13 DIAGNOSIS — R531 Weakness: Secondary | ICD-10-CM | POA: Insufficient documentation

## 2021-08-13 NOTE — ED Notes (Signed)
Patient came up to this tech and said "i am going to go I don't want to wait." This tech encouraged patient to wait to be seen, patient refused and walked out.

## 2021-08-13 NOTE — ED Notes (Signed)
Attempted to do EKG, patient told this tech "no that is too expensive" this tech informed pt that an EKG was needed due to the patient's complaint of weakness. Patient refused EKG.

## 2021-12-03 ENCOUNTER — Encounter (HOSPITAL_COMMUNITY): Payer: Self-pay

## 2021-12-03 ENCOUNTER — Other Ambulatory Visit: Payer: Self-pay

## 2021-12-03 ENCOUNTER — Emergency Department (HOSPITAL_COMMUNITY)
Admission: EM | Admit: 2021-12-03 | Discharge: 2021-12-04 | Disposition: A | Discharge status: Other Acute Inpatient Hospital | Payer: 59 | Attending: Emergency Medicine | Admitting: Emergency Medicine

## 2021-12-03 DIAGNOSIS — R079 Chest pain, unspecified: Secondary | ICD-10-CM | POA: Diagnosis not present

## 2021-12-03 DIAGNOSIS — F29 Unspecified psychosis not due to a substance or known physiological condition: Secondary | ICD-10-CM | POA: Insufficient documentation

## 2021-12-03 DIAGNOSIS — Z20822 Contact with and (suspected) exposure to covid-19: Secondary | ICD-10-CM | POA: Insufficient documentation

## 2021-12-03 DIAGNOSIS — M791 Myalgia, unspecified site: Secondary | ICD-10-CM | POA: Insufficient documentation

## 2021-12-03 DIAGNOSIS — R45851 Suicidal ideations: Secondary | ICD-10-CM | POA: Diagnosis not present

## 2021-12-03 DIAGNOSIS — R0981 Nasal congestion: Secondary | ICD-10-CM | POA: Diagnosis not present

## 2021-12-03 DIAGNOSIS — F1420 Cocaine dependence, uncomplicated: Secondary | ICD-10-CM | POA: Insufficient documentation

## 2021-12-03 DIAGNOSIS — F329 Major depressive disorder, single episode, unspecified: Secondary | ICD-10-CM | POA: Insufficient documentation

## 2021-12-03 DIAGNOSIS — F1490 Cocaine use, unspecified, uncomplicated: Secondary | ICD-10-CM | POA: Diagnosis present

## 2021-12-03 LAB — CBC
HCT: 46.6 % (ref 39.0–52.0)
Hemoglobin: 15.1 g/dL (ref 13.0–17.0)
MCH: 32.6 pg (ref 26.0–34.0)
MCHC: 32.4 g/dL (ref 30.0–36.0)
MCV: 100.6 fL — ABNORMAL HIGH (ref 80.0–100.0)
Platelets: 178 10*3/uL (ref 150–400)
RBC: 4.63 MIL/uL (ref 4.22–5.81)
RDW: 13.3 % (ref 11.5–15.5)
WBC: 3.8 10*3/uL — ABNORMAL LOW (ref 4.0–10.5)
nRBC: 0 % (ref 0.0–0.2)

## 2021-12-03 LAB — BASIC METABOLIC PANEL
Anion gap: 9 (ref 5–15)
BUN: 17 mg/dL (ref 6–20)
CO2: 23 mmol/L (ref 22–32)
Calcium: 8.4 mg/dL — ABNORMAL LOW (ref 8.9–10.3)
Chloride: 108 mmol/L (ref 98–111)
Creatinine, Ser: 1.47 mg/dL — ABNORMAL HIGH (ref 0.61–1.24)
GFR, Estimated: 55 mL/min — ABNORMAL LOW (ref >60)
Glucose, Bld: 89 mg/dL (ref 70–99)
Potassium: 4.1 mmol/L (ref 3.5–5.1)
Sodium: 140 mmol/L (ref 135–145)

## 2021-12-03 NOTE — ED Triage Notes (Addendum)
Pt reports feeling weak and feeling tired x 2 days. He reports his whole body feels sore and feels congested. He also reports numbness to his pinky and ring ringers on his left hand as well as numbness to his left leg, onset today around 12pm. Pt ambulatory to triage room with independent steady gait, speaking clear complete sentences.   Pt also reporting intermittent headache that started yesterday.

## 2021-12-04 ENCOUNTER — Inpatient Hospital Stay (HOSPITAL_COMMUNITY): Admission: AD | Admit: 2021-12-04 | Payer: 59 | Source: Intra-hospital

## 2021-12-04 ENCOUNTER — Encounter (HOSPITAL_COMMUNITY): Payer: Self-pay | Admitting: Behavioral Health

## 2021-12-04 ENCOUNTER — Inpatient Hospital Stay (HOSPITAL_COMMUNITY)
Admission: AD | Admit: 2021-12-04 | Discharge: 2021-12-08 | DRG: 885 | Disposition: A | Discharge status: Home / Self Care | Payer: 59 | Source: Intra-hospital | Attending: Psychiatry | Admitting: Psychiatry

## 2021-12-04 ENCOUNTER — Emergency Department (HOSPITAL_COMMUNITY): Payer: 59

## 2021-12-04 DIAGNOSIS — G4700 Insomnia, unspecified: Secondary | ICD-10-CM | POA: Diagnosis present

## 2021-12-04 DIAGNOSIS — F339 Major depressive disorder, recurrent, unspecified: Secondary | ICD-10-CM | POA: Diagnosis present

## 2021-12-04 DIAGNOSIS — F17210 Nicotine dependence, cigarettes, uncomplicated: Secondary | ICD-10-CM | POA: Diagnosis present

## 2021-12-04 DIAGNOSIS — F331 Major depressive disorder, recurrent, moderate: Secondary | ICD-10-CM | POA: Diagnosis not present

## 2021-12-04 DIAGNOSIS — R059 Cough, unspecified: Secondary | ICD-10-CM | POA: Diagnosis present

## 2021-12-04 DIAGNOSIS — R45851 Suicidal ideations: Secondary | ICD-10-CM | POA: Diagnosis present

## 2021-12-04 DIAGNOSIS — Z79899 Other long term (current) drug therapy: Secondary | ICD-10-CM | POA: Diagnosis not present

## 2021-12-04 DIAGNOSIS — F1420 Cocaine dependence, uncomplicated: Secondary | ICD-10-CM | POA: Diagnosis not present

## 2021-12-04 DIAGNOSIS — F329 Major depressive disorder, single episode, unspecified: Principal | ICD-10-CM | POA: Diagnosis present

## 2021-12-04 DIAGNOSIS — F1410 Cocaine abuse, uncomplicated: Secondary | ICD-10-CM | POA: Diagnosis present

## 2021-12-04 DIAGNOSIS — F1510 Other stimulant abuse, uncomplicated: Secondary | ICD-10-CM | POA: Diagnosis present

## 2021-12-04 DIAGNOSIS — F322 Major depressive disorder, single episode, severe without psychotic features: Principal | ICD-10-CM

## 2021-12-04 DIAGNOSIS — F419 Anxiety disorder, unspecified: Secondary | ICD-10-CM | POA: Diagnosis present

## 2021-12-04 LAB — URINALYSIS, ROUTINE W REFLEX MICROSCOPIC
Bilirubin Urine: NEGATIVE
Glucose, UA: NEGATIVE mg/dL
Hgb urine dipstick: NEGATIVE
Ketones, ur: NEGATIVE mg/dL
Leukocytes,Ua: NEGATIVE
Nitrite: NEGATIVE
Protein, ur: NEGATIVE mg/dL
Specific Gravity, Urine: >1.03 — ABNORMAL HIGH (ref 1.005–1.030)
pH: 5.5 (ref 5.0–8.0)

## 2021-12-04 LAB — RESP PANEL BY RT-PCR (FLU A&B, COVID) ARPGX2
Influenza A by PCR: NEGATIVE
Influenza B by PCR: NEGATIVE
SARS Coronavirus 2 by RT PCR: NEGATIVE

## 2021-12-04 LAB — RAPID URINE DRUG SCREEN, HOSP PERFORMED
Amphetamines: NOT DETECTED
Barbiturates: NOT DETECTED
Benzodiazepines: NOT DETECTED
Cocaine: POSITIVE — AB
Opiates: NOT DETECTED
Tetrahydrocannabinol: NOT DETECTED

## 2021-12-04 LAB — URINALYSIS, MICROSCOPIC (REFLEX)
Bacteria, UA: NONE SEEN
Squamous Epithelial / HPF: NONE SEEN (ref 0–5)

## 2021-12-04 LAB — ETHANOL: Alcohol, Ethyl (B): <10 mg/dL (ref <10)

## 2021-12-04 LAB — TROPONIN I (HIGH SENSITIVITY)
Troponin I (High Sensitivity): 4 ng/L (ref <18)
Troponin I (High Sensitivity): 4 ng/L (ref <18)

## 2021-12-04 LAB — ACETAMINOPHEN LEVEL: Acetaminophen (Tylenol), Serum: <10 ug/mL — ABNORMAL LOW (ref 10–30)

## 2021-12-04 LAB — CBG MONITORING, ED: Glucose-Capillary: 95 mg/dL (ref 70–99)

## 2021-12-04 LAB — SALICYLATE LEVEL: Salicylate Lvl: <7 mg/dL — ABNORMAL LOW (ref 7.0–30.0)

## 2021-12-04 MED ORDER — TRAZODONE HCL 50 MG PO TABS
50.0000 mg | ORAL_TABLET | Freq: Every evening | ORAL | Status: DC | PRN
  Administered 2021-12-06 – 2021-12-07 (×2): 50 mg via ORAL
  Filled 2021-12-04 (×2): qty 1

## 2021-12-04 MED ORDER — ACETAMINOPHEN 325 MG PO TABS
650.0000 mg | ORAL_TABLET | Freq: Four times a day (QID) | ORAL | Status: DC | PRN
  Administered 2021-12-04 – 2021-12-05 (×2): 650 mg via ORAL
  Filled 2021-12-04 (×2): qty 2

## 2021-12-04 MED ORDER — ALUM & MAG HYDROXIDE-SIMETH 200-200-20 MG/5ML PO SUSP: 30.0000 mL | ORAL | Status: DC | PRN

## 2021-12-04 MED ORDER — MAGNESIUM HYDROXIDE 400 MG/5ML PO SUSP: 30.0000 mL | Freq: Every day | ORAL | Status: DC | PRN

## 2021-12-04 NOTE — ED Provider Triage Note (Signed)
Emergency Medicine Provider Triage Evaluation Note  Richard Moon , a 61 y.o. male  was evaluated in triage.  Pt complains of si. Initially seen for malaise, weakness, fatigue and aches. Now suicidal after sitting in the lobby  Review of Systems  Positive: si Negative: Hi   Physical Exam  BP 105/78 (BP Location: Right Arm)   Pulse 85   Temp 99.2 F (37.3 C) (Oral)   Resp 18   Ht 5\' 11"  (1.803 m)   Wt 67.6 kg   SpO2 96%   BMI 20.78 kg/m  Gen:   Awake, no distress   Resp:  Normal effort  MSK:   Moves extremities without difficulty  Other:    Medical Decision Making  Medically screening exam initiated at 4:29 AM.  Appropriate orders placed.  was informed that the remainder of the evaluation will be completed by another provider, this initial triage assessment does not replace that evaluation, and the importance of remaining in the ED until their evaluation is complete.  Work up [name], [place], PA-C 12/04/21 0430

## 2021-12-04 NOTE — ED Notes (Signed)
Pt given a cup of coffee with cream and sugar.

## 2021-12-04 NOTE — ED Notes (Signed)
Pt reported to nurse tech that he was feeling "very suicidal" and that he would do something drastic if he was not seen soon. Pt is now sitting in triage.

## 2021-12-04 NOTE — ED Notes (Signed)
Pt speaking with TTS at this time.  

## 2021-12-04 NOTE — BH Assessment (Signed)
Clinician messaged Crystal L. Black, RN: "Hey. It's Trey with TTS. Is the pt able to engage in the assessment, if so the pt will need to be placed in a private room. Also is the pt under IVC? Is the pt medically cleared?"    Clinician awaiting response.    Redmond Pulling, MS, [facility], [facility] Triage Specialist [phone]

## 2021-12-04 NOTE — Progress Notes (Addendum)
Pt was accepted to [facility] 12/04/2021; Bed Assignment Please fax the voluntary consent to [phone]  Pt meets inpatient criteria per Liborio Nixon, NP  Attending Physician will be Dr. Loleta Chance  Report can be called to: Adult unit: [phone]  Please fax the voluntary consent to [phone].  Care Team: Falcon, Counselor, Nonie Hoyer, RN, Rona Ravens, RN, Liborio Nixon, NP, Roddie Mc, RN.   Kelton Pillar, LCSWA 12/04/2021 @ 1:56 PM

## 2021-12-04 NOTE — ED Notes (Signed)
Patient transported to X-ray 

## 2021-12-04 NOTE — ED Notes (Signed)
Pt informed NT in lobby that he was feeling suicidal. Pt brought to triage for re-evaluation.  Pt states he is feeling suicidal, pt states he is going to run in front of traffic on [address].  Pt states, "I'm tired, I haven't had any sleep".  Pt denies HI, AVH.  PA notified.

## 2021-12-04 NOTE — ED Provider Notes (Signed)
[facility] EMERGENCY DEPARTMENT Provider Note   CSN: [id number] Arrival date & time: 12/03/21  2156     History Chief Complaint  Patient presents with   Weakness    Richard Moon is a 61 y.o. male with history of MDD, cocaine use, suicidal ideations presents to the emergency department for suicidal ideations from today.  Patient also reports that he has been having diffuse body aches for the past 2 days.  Reports some nasal congestion as well.  Additionally he mentions he had some numbness into his left and right hands that started yesterday around 1200.  He reports some but denies any shortness of breath.  Patient does endorse cocaine use with occasional alcohol use.  He reports he drinks not daily.  Denies any nausea, vomiting, abdominal pain, unilateral weakness, trouble walking, lightheadedness, fevers, or chills.  Denies any IV drug use.   Weakness Associated symptoms: chest pain and myalgias   Associated symptoms: no abdominal pain, no dysuria, no fever, no nausea, no shortness of breath and no vomiting        Home Medications Prior to Admission medications   Medication Sig Start Date End Date Taking? Authorizing Provider  ibuprofen (ADVIL) 100 MG tablet Take 400 mg by mouth every 6 (six) hours as needed (headache).    [provider]  sertraline (ZOLOFT) 50 MG tablet Take 1 tablet (50 mg total) by mouth daily. 04/08/21 06/07/21  Estella Husk, MD  traZODone (DESYREL) 100 MG tablet Take 1 tablet (100 mg total) by mouth at bedtime as needed for sleep. 04/07/21 06/06/21  Estella Husk, MD      Allergies    Patient has no known allergies.    Review of Systems   Review of Systems  Constitutional:  Positive for fatigue. Negative for chills and fever.  HENT:  Positive for congestion and rhinorrhea. Negative for sore throat.   Respiratory:  Negative for shortness of breath.   Cardiovascular:  Positive for chest pain.   Gastrointestinal:  Negative for abdominal pain, nausea and vomiting.  Genitourinary:  Negative for dysuria and hematuria.  Musculoskeletal:  Positive for myalgias.  Neurological:  Positive for weakness. Negative for light-headedness.    Physical Exam Updated Vital Signs BP 105/78 (BP Location: Right Arm)   Pulse 85   Temp 99.2 F (37.3 C) (Oral)   Resp 18   Ht 5\' 11"  (1.803 m)   Wt 67.6 kg   SpO2 96%   BMI 20.78 kg/m  Physical Exam Vitals and nursing note reviewed.  Constitutional:      General: He is not in acute distress.    Appearance: Normal appearance. He is not toxic-appearing.  HENT:     Head: Normocephalic and atraumatic.     Right Ear: Tympanic membrane, ear canal and external ear normal.     Left Ear: Tympanic membrane, ear canal and external ear normal.     Nose:     Comments: Bilateral nasal turbinate edema and erythema with scant clear nasal discharge.    Mouth/Throat:     Mouth: Mucous membranes are moist.     Comments: No pharyngeal erythema, exudate, or edema noted.  Uvula midline.  Airway patent.  Moist mucous membranes. Eyes:     General: No scleral icterus.    Conjunctiva/sclera: Conjunctivae normal.  Cardiovascular:     Rate and Rhythm: Normal rate and regular rhythm.  Pulmonary:     Effort: Pulmonary effort is normal. No respiratory distress.  Breath sounds: Normal breath sounds.  Abdominal:     General: Bowel sounds are normal.     Palpations: Abdomen is soft.     Tenderness: There is no abdominal tenderness. There is no guarding or rebound.  Musculoskeletal:        General: No deformity.     Cervical back: Normal range of motion.  Skin:    General: Skin is warm and dry.     Findings: No rash.  Neurological:     General: No focal deficit present.     Mental Status: He is alert. Mental status is at baseline.     GCS: GCS eye subscore is 4. GCS verbal subscore is 5. GCS motor subscore is 6.     Cranial Nerves: No cranial nerve deficit,  dysarthria or facial asymmetry.     Motor: No pronator drift.     Coordination: Finger-Nose-Finger Test normal.     Comments: Alert and oriented x3.  GCS 15.  Cranial nerves II through XII intact.  No facial droop noted.  Patient is answering questions appropriate with appropriate speech.  No pronator drift.  Strength is equal in patient's upper and lower bilateral extremities.  Sensation intact throughout. Normal finger to nose.      ED Results / Procedures / Treatments   Labs (all labs ordered are listed, but only abnormal results are displayed) Labs Reviewed  BASIC METABOLIC PANEL - Abnormal; Notable for the following components:      Result Value   Creatinine, Ser 1.47 (*)    Calcium 8.4 (*)    GFR, Estimated 55 (*)    All other components within normal limits  CBC - Abnormal; Notable for the following components:   WBC 3.8 (*)    MCV 100.6 (*)    All other components within normal limits  RAPID URINE DRUG SCREEN, HOSP PERFORMED - Abnormal; Notable for the following components:   Cocaine POSITIVE (*)    All other components within normal limits  RESP PANEL BY RT-PCR (FLU A&B, COVID) [id number]  URINALYSIS, ROUTINE W REFLEX MICROSCOPIC  ACETAMINOPHEN LEVEL  ETHANOL  SALICYLATE LEVEL  CBG MONITORING, ED  TROPONIN I (HIGH SENSITIVITY)    EKG EKG Interpretation  Date/Time:  Wednesday December 03 2021 22:25:00 EDT Ventricular Rate:  90 PR Interval:  136 QRS Duration: 88 QT Interval:  356 QTC Calculation: 435 R Axis:   83 Text Interpretation: Normal sinus rhythm Normal ECG Confirmed by Tilden Fossa [phone]) on 12/04/2021 5:38:11 AM  Radiology No results found.  Procedures Procedures    Medications Ordered in ED Medications - No data to display  ED Course/ Medical Decision Making/ A&P                           Medical Decision Making Amount and/or Complexity of Data Reviewed Labs: ordered. Radiology: ordered.  \ 61 year old male with history of cocaine use and  suicidal ideations presents to the emergency department for evaluation of Seidel ideation starting today.  Vital signs are unremarkable.  Patient normotensive, afebrile, normal pulse rate, satting well room air without increased work of breathing.  Physical exam is pertinent for well-appearing patient with no pharyngeal erythema, edema, or exudate noted.  He does have some bilateral nasal turbinate erythema and swelling with scant clear nasal discharge.  No cervical lymphadenopathy.  He has a benign neurological exam.  His sensation is equal and intact in his bilateral lower and upper extremities and of  his face.  No cranial nerve deficit.  No pronator drift.  Normal finger-nose.  Given chest pain in the setting of cocaine use, will order troponins as well as medical clearance labs for consult for TTS.  I independently reviewed and interpreted the patient's labs.  BMP shows slight elevation in creatinine at 1.47 although this is consistent with patient's baseline.  Mild decrease in calcium at 8.4.  CBC shows mild leukopenia at 3.8, without anemia.  UDS positive for cocaine.  COVID and flu show negative.  Salicylate, acetaminophen, and ethanol level shows negative.  Troponin is 4 with repeat of 4, delta 0.  EKG shows normal sinus rhythm.  Chest x-ray shows no acute cardiopulmonary process.  This patient has no objective findings of any substernal deficits in his arms or his face or legs.  He has subjective decrease sensation to his arms.  I do not think any CT is needed at this time after speaking with my attending who agrees to the plan.  Given the patient's reassuring troponins, chest x-ray, EKG, and overall physical exam, patient is safe for TTS.  Patient is medically clear.  He denies taking daily medications.  Advised the patient to stop using cocaine. Patient is medically clear.  He presents with suicidal ideations often.  He is here voluntarily, do not think IVC is needed at this time. Awaiting  TTS.  I discussed this case with my attending physician who cosigned this note including patient's presenting symptoms, physical exam, and planned diagnostics and interventions. Attending physician stated agreement with plan or made changes to plan which were implemented.   Final Clinical Impression(s) / ED Diagnoses Final diagnoses:  Suicidal ideations    Rx / DC Orders ED Discharge Orders     None         Achille Rich, PA-C 12/04/21 1156    Margarita Grizzle, MD 12/08/21 1217

## 2021-12-04 NOTE — ED Notes (Signed)
Belongings inventoried and placed in purple zone locker #2. Security called via radio to want pt.

## 2021-12-04 NOTE — BH Assessment (Addendum)
TTS spoke to [employer], to put pt in a private room to complete TTS assessment.  Pt's nurse will message TTS when she is ready for consult.

## 2021-12-04 NOTE — ED Notes (Signed)
Pt wanded by security. 

## 2021-12-04 NOTE — Tx Team (Signed)
Initial Treatment Plan 12/04/2021 5:47 PM Richard Moon [id number]    PATIENT STRESSORS: Other: "several things over the years"     PATIENT STRENGTHS: Capable of independent living  Financial means  Supportive family/friends  Work skills    PATIENT IDENTIFIED PROBLEMS: Depression  SI thoughts & plan                    DISCHARGE CRITERIA:  Ability to meet basic life and health needs Improved stabilization in mood, thinking, and/or behavior Motivation to continue treatment in a less acute level of care Verbal commitment to aftercare and medication compliance  PRELIMINARY DISCHARGE PLAN: Outpatient therapy Return to previous living arrangement Return to previous work or school arrangements  PATIENT/FAMILY INVOLVEMENT: This treatment plan has been presented to and reviewed with the patient, Richard Moon.  The patient has been given the opportunity to ask questions and make suggestions.  Sofie Hartigan, RN 12/04/2021, 5:47 PM

## 2021-12-04 NOTE — ED Notes (Signed)
Pt taken by this RN to [employer] vehicle to be transported to [facility].

## 2021-12-04 NOTE — BH Assessment (Addendum)
Comprehensive Clinical Assessment (CCA) Note  12/04/2021 [name] [id number]  Chief Complaint:  Chief Complaint  Patient presents with   Weakness   Visit Diagnosis:  F32.9 Major depressive disorder, Single episode, Unspecified  F14.20 Cocaine use disorder, Severe  Flowsheet Row ED from 12/03/2021 in [place] ED from 04/01/2021 in [facility] ED from 03/30/2021 in [facility] Low Risk Low Risk High Risk      The patient demonstrates the following risk factors for suicide: Chronic risk factors for suicide include: psychiatric disorder of major depression disorder, substance use disorder, and previous suicide attempts by overdose . Acute risk factors for suicide include: social withdrawal/isolation and loss (financial, interpersonal, professional). Protective factors for this patient include: positive social support, positive therapeutic relationship, coping skills, hope for the future, and life satisfaction. Considering these factors, the overall suicide risk at this point appears to be low. Patient is not appropriate for outpatient follow up.   Disposition: [name] NP, patient meets inpatient criteria.  SW contacted and bed availability under review. Disposition discussed with Risk analyst.  RN to discuss disposition with EDP.  Richard Moon is 61 years old male who presents voluntarily to [place] and unaccompanied.  Pt reports suicide with a plan to walk into traffic on [address].  Pt reports prior self harm by cutting several years ago.    Pt reports a history of depression for a week.  Pt denies HI or AVH.  When asked if he wants to hurt himself now, he reports yes'.  Pt acknowledged the following symptoms: sadness, fatigue, hopelessness, loss of interest, and decreased sleep.  Pt reports "I have not slept in two days".  Pt reports eating one  meal during the day.  Pt reports drinking one or two beers and using crack cocaine in the amount of 40.00 dollars on 12/03/21.  Pt identifies his primary stressor as with being alone,' isolation'.  Pt reports that he lives alone, no support person. Pt reports "after I spend my money on cocaine, I am sad".  Pt reports family history of mental illness; also reports his father was an alcoholic.  Pt denies any history of abuse or trauma.  Pt denies any current legal problems.  Pt reports no guns or weapons in the home.   Pt says he is not currently receiving weekly outpatient therapy; also, reports not receiving outpatient medication management.  Pt reports one previous inpatient psychiatric hospitalization in 2019.  Pt is dressed in scrubs, alert, orient x 3 with normal speech and calm motor behavior.  Eye contact is normal.  Pt's mood is depressed and affect is depressed.  Thought process coherent.  Pt's insight is poor and judgment is impaired.  There is no indication Pt is currently responding to internal stimuli or experiencing delusional thought content.  Pt was cooperative throughout assessment.   CCA Screening, Triage and Referral (STR)  Patient Reported Information How did you hear about [place]? Self  What Is the Reason for Your Visit/Call Today? SI, Depression  How Long Has This Been Causing You Problems? 1 wk - 1 month  What Do You Feel Would Help You the Most Today? Treatment for Depression or other mood problem; Alcohol or Drug Use Treatment   Have You Recently Had Any Thoughts About Hurting Yourself? Yes  Are You Planning to Commit Suicide/Harm Yourself At This time? No   Have you Recently Had  Thoughts About Hurting Someone Gu[place]upe Dawn? No  Are You Planning to Harm Someone at This Time? No  Explanation: No data recorded  Have You Used Any Alcohol or Drugs in the Past 24 Hours? Yes  How Long Ago Did You Use Drugs or Alcohol? No data recorded What Did You Use and How Much? Cocaine, 24  hours   Do You Currently Have a Therapist/Psychiatrist? No  Name of Therapist/Psychiatrist: No data recorded  Have You Been Recently Discharged From Any Office Practice or Programs? No  Explanation of Discharge From Practice/Program: No data recorded    CCA Screening Triage Referral Assessment Type of Contact: Tele-Assessment  Telemedicine Service Delivery: Telemedicine service delivery: This service was provided via telemedicine using a 2-way, interactive audio and video technology  Is this Initial or Reassessment? Initial Assessment  Date Telepsych consult ordered in [facility]:  12/04/21  Time Telepsych consult ordered in [facility]:  1630  Location of Assessment: [facility] ED  Provider Location: [facility] Assessment Services   Collateral Involvement: No collateral involved.   Does Patient Have a Stage manager Guardian? No data recorded Name and Contact of Legal Guardian: No data recorded If Minor and Not Living with Parent(s), Who has Custody? n/a  Is CPS involved or ever been involved? Never  Is APS involved or ever been involved? Never   Patient Determined To Be At Risk for Harm To Self or Others Based on Review of Patient Reported Information or Presenting Complaint? Yes, for Self-Harm  Method: No data recorded Availability of Means: No data recorded Intent: No data recorded Notification Required: No data recorded Additional Information for Danger to Others Potential: No data recorded Additional Comments for Danger to Others Potential: No data recorded Are There Guns or Other Weapons in Your Home? No data recorded Types of Guns/Weapons: No data recorded Are These Weapons Safely Secured?                            No data recorded Who Could Verify You Are Able To Have These Secured: No data recorded Do You Have any Outstanding Charges, Pending Court Dates, Parole/Probation? No data recorded Contacted To Inform of Risk of Harm To Self or Others: Other: Comment (No need  to  notify)    Does Patient Present under Involuntary Commitment? No  IVC Papers Initial File Date: No data recorded  [place] of Residence: [place]   Patient Currently Receiving the Following Services: Not Receiving Services   Determination of Need: Urgent (48 hours)   Options For Referral: [facility]     CCA Biopsychosocial Patient Reported Schizophrenia/Schizoaffective Diagnosis in Past: No   Strengths: uta   Mental Health Symptoms Depression:   Hopelessness; Change in energy/activity; Difficulty Concentrating; Irritability; Sleep (too much or little); Worthlessness   Duration of Depressive symptoms:  Duration of Depressive Symptoms: Less than two weeks   Mania:   None   Anxiety:    Worrying; Restlessness; Fatigue; Tension   Psychosis:   None   Duration of Psychotic symptoms:    Trauma:   None   Obsessions:   None   Compulsions:   None   Inattention:   None   Hyperactivity/Impulsivity:   None   Oppositional/Defiant Behaviors:   N/A   Emotional Irregularity:   Potentially harmful impulsivity; Mood lability; Recurrent suicidal behaviors/gestures/threats   Other Mood/Personality Symptoms:   Depressed    Mental Status Exam Appearance and self-care  Stature:   Tall  Weight:   Thin   Clothing:   -- (Pt dressed in scrubs.)   Grooming:   Normal   Cosmetic use:   None   Posture/gait:   Normal   Motor activity:   Not Remarkable; Restless   Sensorium  Attention:   Normal   Concentration:   Normal   Orientation:   Person; Place; Situation; Time   Recall/memory:   Normal   Affect and Mood  Affect:   Depressed   Mood:   Depressed; Worthless; Hopeless   Relating  Eye contact:   Normal   Facial expression:   Depressed   Attitude toward examiner:   Cooperative   Thought and Language  Speech flow:  Clear and Coherent; Paucity   Thought content:   Appropriate to Mood and Circumstances   Preoccupation:    Suicide   Hallucinations:   None   Organization:  No data recorded  [employer] of Knowledge:   Average   Intelligence:   Average   Abstraction:   Normal   Judgement:   Poor   Reality Testing:   Adequate   Insight:   Poor   Decision Making:   Normal (UTA)   Social Functioning  Social Maturity:   Isolates   Social Judgement:   "Chief of Staff"   Stress  Stressors:   Housing; Surveyor, quantity; Relationship   Coping Ability:   Contractor Deficits:   Decision making   Supports:   Support needed     Religion: Religion/Spirituality Are You A Religious Person?: No How Might This Affect Treatment?: Pt reports spirituality helps when working his program ([employer])  Leisure/Recreation: Leisure / Recreation Do You Have Hobbies?: No  Exercise/Diet: Exercise/Diet Do You Exercise?: No Have You Gained or Lost A Significant Amount of Weight in the Past Six Months?: No Do You Follow a Special Diet?: No Do You Have Any Trouble Sleeping?: Yes Explanation of Sleeping Difficulties: Pt reports unable to sleep, "I have not slept in two days"   CCA Employment/Education Employment/Work Situation: Employment / Work Situation Employment Situation: Unemployed Patient's Job has Been Impacted by Current Illness: No Describe how Patient's Job has Been Impacted: n/a Has Patient ever Been in the 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?: No  Education: Education Is Patient Currently Attending School?: No Last Grade Completed: 10 Did You Product manager?: No Did You Have An Individualized Education Program (IIEP): No Did You Have Any Difficulty At School?: No Patient's Education Has Been Impacted by Current Illness: No   CCA Family/Childhood History Family and Relationship History: Family history Marital status: Single Does patient have children?: Yes (1  adult daughter) How many children?: 1 How is patient's relationship with their children?: close  Childhood History:  Childhood History By whom was/is the patient raised?: Both parents Did patient suffer any verbal/emotional/physical/sexual abuse as a child?: No Did patient suffer from severe childhood neglect?: No Has patient ever been sexually abused/assaulted/raped as an adolescent or adult?: No Was the patient ever a victim of a crime or a disaster?: No Witnessed domestic violence?: No Has patient been affected by domestic violence as an adult?: No  Child/Adolescent Assessment:     CCA Substance Use Alcohol/Drug Use: Alcohol / Drug Use Pain Medications: See MAR Prescriptions: See MAR Over the Counter: See MAR History of alcohol / drug use?: Yes (Cocaine) Longest period of sobriety (when/how long): Pt reports 3 years of sobriety  Negative Consequences of Use: Financial, Work / Programmer, multimedia, Personal relationships Withdrawal Symptoms: Irritability                         ASAM's:  Six Dimensions of Multidimensional Assessment  Dimension 1:  Acute Intoxication and/or Withdrawal Potential:   Dimension 1:  Description of individual's past and current experiences of substance use and withdrawal: Patient states that he experiences irritability when he is withdrawing from cocaine  Dimension 2:  Biomedical Conditions and Complications:   Dimension 2:  Description of patient's biomedical conditions and  complications: Patient has no medical issues that are complicated by his use of cocaine, but he putting his heart and kidneys at risk by using  Dimension 3:  Emotional, Behavioral, or Cognitive Conditions and Complications:  Dimension 3:  Description of emotional, behavioral, or cognitive conditions and complications: Patient uses substances to self-medicate his depression  Dimension 4:  Readiness to Change:  Dimension 4:  Description of Readiness to Change criteria: Patient states  that he is ready to take the steps necessary to change his life and to live drug free  Dimension 5:  Relapse, Continued use, or Continued Problem Potential:  Dimension 5:  Relapse, continued use, or continued problem potential critiera description: Patient has a minimal history of clean time and he is at high risk for relapse unless he is able to identify new coping strategies that he can use to deter his use  Dimension 6:  Recovery/Living Environment:  Dimension 6:  Recovery/Iiving environment criteria description: Patient states that he is homeless and he has no emotional support  ASAM Severity Score: ASAM's Severity Rating Score: 13  ASAM Recommended Level of Treatment: ASAM Recommended Level of Treatment: Level III Residential Treatment   Substance use Disorder (SUD) Substance Use Disorder (SUD)  Checklist Symptoms of Substance Use: Substance(s) often taken in larger amounts or over longer times than was intended, Continued use despite having a persistent/recurrent physical/psychological problem caused/exacerbated by use, Continued use despite persistent or recurrent social, interpersonal problems, caused or exacerbated by use, Large amounts of time spent to obtain, use or recover from the substance(s), Recurrent use that results in a failure to fulfill major role obligations (work, school, home), Social, occupational, recreational activities given up or reduced due to use  Recommendations for Services/Supports/Treatments: Recommendations for Services/Supports/Treatments Recommendations For Services/Supports/Treatments: Residential-Level 3  Discharge Disposition:    DSM5 Diagnoses: Patient Active Problem List   Diagnosis Date Noted   Substance induced mood disorder (HCC) 04/01/2021   Polysubstance abuse (HCC) 01/28/2021   Suicidal ideations 01/28/2021   Suicidal ideation    Cocaine-induced mood disorder (HCC)    Cocaine dependence with cocaine-induced mood disorder (HCC)    MDD (major  depressive disorder), severe (HCC) 03/12/2018     Referrals to Alternative Service(s): Referred to Alternative Service(s):   Place:   Date:   Time:    Referred to Alternative Service(s):   Place:   Date:   Time:    Referred to Alternative Service(s):   Place:   Date:   Time:    Referred to Alternative Service(s):   Place:   Date:   Time:     Meryle Ready, Counselor

## 2021-12-04 NOTE — Progress Notes (Signed)
Admission note: Pt presented to the [facility] due to SI with a plan to walk into traffic on [address]. Pt has hx of cutting. Pt stated that he has used THC and cocaine everyday. Pt drinks occasionally and 3-4 beers when he does. Pt stressors are "several things over the years and mood swings and such.The pain in my legs too and not getting answers from the ED about what is causing it." Pt was very forwards little and needed prompting to go into the little detail that the pt did give. Pt works in [employer] and rents a room from a lady. Pt states his family supports are cousins that live in the area but does not care for them to know about his care. Pt endorses SI with a plan to walk into traffic but is able to contract for safety. Pt denies AVH. Pt went down for dinner. Consents signed, handbook detailing the patient's rights, responsibilities, and visitor guidelines provided. Skin/belongings search completed and patient oriented to unit. Patient stable at this time. Patient given the opportunity to express concerns and ask questions. Patient given toiletries. Will continue to monitor.    12/04/21 1700  Psych Admission Type (Psych Patients Only)  Admission Status Voluntary  Psychosocial Assessment  Patient Complaints Confusion;Depression;Hopelessness;Loneliness;Sadness;Worrying  Eye Contact Fair  Facial Expression Sad  Affect Appropriate to circumstance  Speech Logical/coherent  Interaction Forwards little;Guarded  Motor Activity Slow  Appearance/Hygiene Body odor;In scrubs  Behavior Characteristics Cooperative;Appropriate to situation;Calm  Mood Depressed;Sad;Pleasant  Thought Process  Coherency WDL  Content Blaming self  Delusions None reported or observed  Perception WDL  Hallucination None reported or observed  Judgment Impaired  Confusion None  Danger to Self  Current suicidal ideation? Active  Self-Injurious Behavior Some self-injurious ideation observed or expressed.  No lethal  plan expressed   Agreement Not to Harm Self Yes  Description of Agreement verbal  Danger to Others  Danger to Others None reported or observed

## 2021-12-04 NOTE — BH Assessment (Signed)
Clinician reviewed pt's chart in preparation to complete pt's MH Assessment. However, as of 0525, there is currently no EDP note in pt's chart and, thus, pt is not yet medically cleared. TTS to attempt assessment at a later time.

## 2021-12-05 ENCOUNTER — Encounter (HOSPITAL_COMMUNITY): Payer: Self-pay

## 2021-12-05 ENCOUNTER — Encounter (HOSPITAL_COMMUNITY): Payer: Self-pay | Admitting: Behavioral Health

## 2021-12-05 LAB — RESP PANEL BY RT-PCR (FLU A&B, COVID) ARPGX2
Influenza A by PCR: NEGATIVE
Influenza B by PCR: NEGATIVE
SARS Coronavirus 2 by RT PCR: NEGATIVE

## 2021-12-05 MED ORDER — ALBUTEROL SULFATE HFA 108 (90 BASE) MCG/ACT IN AERS: 1.0000 | INHALATION_SPRAY | Freq: Four times a day (QID) | RESPIRATORY_TRACT | Status: DC | PRN

## 2021-12-05 MED ORDER — HYDROXYZINE HCL 25 MG PO TABS
25.0000 mg | ORAL_TABLET | Freq: Three times a day (TID) | ORAL | Status: DC | PRN
  Administered 2021-12-06: 25 mg via ORAL
  Filled 2021-12-05: qty 1

## 2021-12-05 MED ORDER — GUAIFENESIN ER 600 MG PO TB12
600.0000 mg | ORAL_TABLET | Freq: Two times a day (BID) | ORAL | Status: DC
  Administered 2021-12-05 – 2021-12-08 (×6): 600 mg via ORAL
  Filled 2021-12-05 (×11): qty 1

## 2021-12-05 MED ORDER — SERTRALINE HCL 50 MG PO TABS
50.0000 mg | ORAL_TABLET | Freq: Every day | ORAL | Status: DC
  Administered 2021-12-05 – 2021-12-08 (×4): 50 mg via ORAL
  Filled 2021-12-05 (×7): qty 1

## 2021-12-05 NOTE — H&P (Signed)
Psychiatric Admission Assessment Adult  Patient Identification: Sargent Mankey  MRN:  [id number]  Date of Evaluation:  12/05/2021  Chief Complaint: Suicidal ideations triggered by guilt from drug use.  Principal Diagnosis: MDD (major depressive disorder)  Diagnosis:  Principal Problem:   MDD (major depressive disorder)  History of Present Illness: This is an admission evaluation for this 61 year old AA male with prior hx of major depressive disorder & stimulant (cocaine) use disorder. He is known in this [facility] & the [facility] for complain of worsening symptoms of depression, drug use & or suicidal ideations. Chart review reports indicated that patient has been seen at the other psychiatric hospitals within the surrounding areas with similar complaints. He is admitted to the [facility] this time around with complaint of suicidal ideations with plan to walk into traffic. His UDS was positive for cocaine. After medical evaluation/stabilization & clearance, he was transferred to the [facility] for further psychiatric evaluation & treatments. During this evaluation, Kadrian reports,   "I was not feeling well two days ago. I went to the ED to get checked-out. While I was at the ED, I started feeling suicidal. I have been depressed 3-4 days prior to going to the ED. There are two much stuff going on in my life such as doing drugs & drinking too much alcohol at times. I have been abusing drugs for over 30 years. I can't seem to stop. I smoke crack & weed. I can't stop myself, it does not matter what I have tried very hard to do to help me stop. I joined the [employer]. I got a sponsor. I attend meetings like my life depends on it, but I always relapse. Alcohol is also a problem for me, but not as much as cocaine & weed are to me. I drink about 6-packs of beer at a time occasionally. I have been prescribed medicines for my depression, I have not really taken it. I just do not know what to do. I need help. I would like to try  medicines again to see if it help my depression. I don't feel too well today. I'm running some fever. I have been coughing all night & I feel congested. I'm still feeling suicidal, but feels safe being here".  During this evaluation, Izear was lying down in bed. He is making a fair eye contact, verbally responsive. He presents with cough & congestion. A review of his vitals shows: elevated pulse rate of 118, a temperature of 100.5 & O2 sat of 95% in room air. His respiratory panel results were all negative. We will obtain chest Xray. Patient however, does not appear to be in any apparent/acute distress. Will obtain a repeat CBC with diff.  Associated Signs/Symptoms:  Depression Symptoms:  depressed mood, insomnia, feelings of worthlessness/guilt, hopelessness, suicidal thoughts without plan, anxiety,  Duration of Depression Symptoms: Less than two weeks  (Hypo) Manic Symptoms:  Impulsivity,  Anxiety Symptoms:  Excessive Worry,  Psychotic Symptoms:   Denies any hallucinations, delusions or paranoia. He does not appear to be responding to any internal stimuli.  PTSD Symptoms: NA  Total Time spent with patient: 1 hour  Past Psychiatric History: Major depressive disorder, Cannabis use disorder, Cocaine use disorder,   Is the patient at risk to self? Yes.    Has the patient been a risk to self in the past 6 months? No.  Has the patient been a risk to self within the distant past? Yes.    Is the patient a  risk to others? No.  Has the patient been a risk to others in the past 6 months? No.  Has the patient been a risk to others within the distant past? No.   Prior Inpatient Therapy: Yes, [facility] multiple time, [facility], [facility]. Prior Outpatient Therapy: Denies  Alcohol Screening: 1. How often do you have a drink containing alcohol?: 2 to 4 times a month 2. How many drinks containing alcohol do you have on a typical day when you are drinking?: 3 or 4 3. How often do you have  six or more drinks on one occasion?: Less than monthly AUDIT-C Score: 4 4. How often during the last year have you found that you were not able to stop drinking once you had started?: Never 5. How often during the last year have you failed to do what was normally expected from you because of drinking?: Never 6. How often during the last year have you needed a first drink in the morning to get yourself going after a heavy drinking session?: Never 7. How often during the last year have you had a feeling of guilt of remorse after drinking?: Never 8. How often during the last year have you been unable to remember what happened the night before because you had been drinking?: Never 9. Have you or someone else been injured as a result of your drinking?: No 10. Has a relative or friend or a doctor or another health worker been concerned about your drinking or suggested you cut down?: No Alcohol Use Disorder Identification Test Final Score (AUDIT): 4 Alcohol Brief Interventions/Follow-up: Patient Refused  Substance Abuse History in the last 12 months:  Yes.    Consequences of Substance Abuse: Discussed with patient during this admission evaluation. Medical Consequences:  Liver damage, Possible death by overdose Legal Consequences:  Arrests, jail time, Loss of driving privilege. Family Consequences:  Family discord, divorce and or separation.   Previous Psychotropic Medications:  Yes, Sertraline, Seroquel, [name], Trazodone  Psychological Evaluations: No   Past Medical History: History reviewed. No pertinent past medical history.  Past Surgical History:  Procedure Laterality Date   HERNIA REPAIR     Family History: History reviewed. No pertinent family history.  Family Psychiatric  History: Denies any familial hx of mental illness.  Tobacco Screening: Smokes about 4 cigarettes daily.  Social History:  Social History   Substance and Sexual Activity  Alcohol Use Yes   Alcohol/week: 4.0  standard drinks of alcohol   Types: 4 Cans of beer per week   Comment: Occasionally      Social History   Substance and Sexual Activity  Drug Use Yes   Types: Marijuana, Cocaine, Heroin    Additional Social History:  Allergies:  No Known Allergies  Lab Results:  Results for orders placed or performed during the hospital encounter of 12/04/21 (from the past 48 hour(s))  Resp Panel by RT-PCR (Flu A&B, Covid) Anterior Nasal Swab     Status: None   Collection Time: 12/05/21 11:00 AM   Specimen: Anterior Nasal Swab  Result Value Ref Range   SARS Coronavirus 2 by RT PCR NEGATIVE NEGATIVE    Comment: (NOTE) SARS-CoV-2 target nucleic acids are NOT DETECTED.  The SARS-CoV-2 RNA is generally detectable in upper respiratory specimens during the acute phase of infection. The lowest concentration of SARS-CoV-2 viral copies this assay can detect is 138 copies/mL. A negative result does not preclude SARS-Cov-2 infection and should not be used as the sole basis  for treatment or other patient management decisions. A negative result may occur with  improper specimen collection/handling, submission of specimen other than nasopharyngeal swab, presence of viral mutation(s) within the areas targeted by this assay, and inadequate number of viral copies(<138 copies/mL). A negative result must be combined with clinical observations, patient history, and epidemiological information. The expected result is Negative.  Fact Sheet for Patients:  BloggerCourse.com  Fact Sheet for Healthcare Providers:  SeriousBroker.it  This test is no t yet approved or cleared by the [place] FDA and  has been authorized for detection and/or diagnosis of SARS-CoV-2 by FDA under an Emergency Use Authorization (EUA). This EUA will remain  in effect (meaning this test can be used) for the duration of the COVID-19 declaration under Section 564(b)(1) of the Act,  21 U.S.C.section 360bbb-3(b)(1), unless the authorization is terminated  or revoked sooner.       Influenza A by PCR NEGATIVE NEGATIVE   Influenza B by PCR NEGATIVE NEGATIVE    Comment: (NOTE) The Xpert Xpress SARS-CoV-2/FLU/RSV plus assay is intended as an aid in the diagnosis of influenza from Nasopharyngeal swab specimens and should not be used as a sole basis for treatment. Nasal washings and aspirates are unacceptable for Xpert Xpress SARS-CoV-2/FLU/RSV testing.  Fact Sheet for Patients: BloggerCourse.com  Fact Sheet for Healthcare Providers: SeriousBroker.it  This test is not yet approved or cleared by the [place] FDA and has been authorized for detection and/or diagnosis of SARS-CoV-2 by FDA under an Emergency Use Authorization (EUA). This EUA will remain in effect (meaning this test can be used) for the duration of the COVID-19 declaration under Section 564(b)(1) of the Act, 21 U.S.C. section 360bbb-3(b)(1), unless the authorization is terminated or revoked.  Performed at [facility], [address] [place], [place] [address]    Blood Alcohol level:  Lab Results  Component Value Date   [facility] <10 12/04/2021   ETH <10 03/30/2021   Metabolic Disorder Labs:  Lab Results  Component Value Date   HGBA1C 5.8 (H) 04/02/2021   MPG 119.76 04/02/2021   No results found for: "PROLACTIN" Lab Results  Component Value Date   CHOL 190 04/02/2021   TRIG 66 04/02/2021   HDL 78 04/02/2021   CHOLHDL 2.4 04/02/2021   VLDL 13 04/02/2021   LDLCALC 99 04/02/2021   Current Medications: Current Facility-Administered Medications  Medication Dose Route Frequency Provider Last Rate Last Admin   acetaminophen (TYLENOL) tablet 650 mg  650 mg Oral Q6H PRN White, Patrice L, NP   650 mg at 12/04/21 2050   albuterol (VENTOLIN HFA) 108 (90 Base) MCG/ACT inhaler 1 puff  1 puff Inhalation Q6H PRN Jaelah Hauth I, NP        alum & mag hydroxide-simeth (MAALOX/MYLANTA) 200-200-20 MG/5ML suspension 30 mL  30 mL Oral Q4H PRN White, Patrice L, NP       guaiFENesin (MUCINEX) 12 hr tablet 600 mg  600 mg Oral BID Olevia Westervelt I, NP       magnesium hydroxide (MILK OF MAGNESIA) suspension 30 mL  30 mL Oral Daily PRN White, Patrice L, NP       sertraline (ZOLOFT) tablet 50 mg  50 mg Oral Daily Mandell Pangborn I, NP       traZODone (DESYREL) tablet 50 mg  50 mg Oral QHS PRN White, Patrice L, NP       PTA Medications: Medications Prior to Admission  Medication Sig Dispense Refill Last Dose   sertraline (ZOLOFT)  50 MG tablet Take 1 tablet (50 mg total) by mouth daily. (Patient not taking: Reported on 12/04/2021) 30 tablet 1    traZODone (DESYREL) 100 MG tablet Take 1 tablet (100 mg total) by mouth at bedtime as needed for sleep. (Patient not taking: Reported on 12/04/2021) 30 tablet 1    Musculoskeletal: Strength & Muscle Tone: within normal limits Gait & Station: normal Patient leans: N/A  Psychiatric Specialty Exam:  Presentation  General Appearance: Casual; Fairly Groomed  Eye Contact:Fair  Speech:Clear and Coherent; Normal Rate  Speech Volume:Normal  Handedness:Right   Mood and Affect  Mood:Anxious; Depressed; Hopeless  Affect:Congruent; Depressed; Flat   Thought Process  Thought Processes:Coherent; Goal Directed  Duration of Psychotic Symptoms: No data recorded  Past Diagnosis of Schizophrenia or Psychoactive disorder: No  Descriptions of Associations:Intact  Orientation:Full (Time, Place and Person)  Thought Content:Logical  Hallucinations:Hallucinations: None   Ideas of Reference:None  Suicidal Thoughts:Suicidal Thoughts: Yes, Passive SI Passive Intent and/or Plan: Without Intent; Without Plan; Without Means to Carry Out; Without Access to Means  Homicidal Thoughts:Homicidal Thoughts: No  Sensorium  Memory:Immediate Good; Recent Good; Remote  Good  Judgment:Fair  Insight:Good  Executive Functions  Concentration:Good  Attention Span:Good  Recall:Good  Fund of Knowledge:Good  Language:Good  Psychomotor Activity  Psychomotor Activity:Psychomotor Activity: Normal  Assets  Assets:Communication Skills; Desire for Improvement; Financial Resources/Insurance; Housing; Social Support; Physical Health  Sleep  Sleep:Sleep: Good Number of Hours of Sleep: 7.75  Physical Exam: Physical Exam Vitals and nursing note reviewed.  HENT:     Nose: Nose normal.     Mouth/Throat:     Pharynx: Oropharynx is clear.  Cardiovascular:     Rate and Rhythm: Normal rate.     Comments: Elevated pulse rate: 118.  Patient is currently in no apparent distress. Pulmonary:     Effort: Pulmonary effort is normal.  Genitourinary:    Comments: Deferred Musculoskeletal:        General: Normal range of motion.     Cervical back: Normal range of motion.  Skin:    General: Skin is warm and dry.  Neurological:     General: No focal deficit present.     Mental Status: He is alert and oriented to person, place, and time.    Review of Systems  Constitutional:  Negative for chills, diaphoresis and fever.  HENT:  Negative for congestion and sore throat.   Respiratory:  Negative for cough, shortness of breath and wheezing.   Cardiovascular:  Negative for chest pain and palpitations.       Elevated pulse rate: 118.  Patient is currently in no apparent distress.   Gastrointestinal:  Negative for abdominal pain, constipation, diarrhea, heartburn, nausea and vomiting.  Musculoskeletal:  Positive for myalgias.  Neurological:  Negative for dizziness, tingling, tremors, sensory change, speech change, focal weakness, seizures, loss of consciousness, weakness and headaches.  Endo/Heme/Allergies:        Allergies: NKDA  Psychiatric/Behavioral:  Positive for depression, substance abuse (UDS (+) for cocaine.) and suicidal ideas (Pt reports passive,  denies any plans or intent.). Negative for hallucinations and memory loss. The patient is nervous/anxious and has insomnia.    Blood pressure 102/84, pulse (!) 118, temperature (!) 100.5 F (38.1 C), temperature source Oral, resp. rate 16, height 5\' 11"  (1.803 m), weight 65 kg, SpO2 95 %. Body mass index is 19.97 kg/m.  Treatment Plan Summary: Daily contact with patient to assess and evaluate symptoms and progress in treatment and Medication management.  Continue inpatient hospitalization.   Diagnoses:  Major depressive disorder, recurrent episodes. Stimulant (cocaine) use disorder, chronic.  Plan. - Initiated Sertraline 50 mg po daily for depression. - Hydroxyzine 25 mg po tid prn for anxiety.  - Continue Trazodone 50 mg po Q hs prn for insomnia.  Other medical complaints. - Initiated Mucines 600 mg po bid x 5 days for cough/congestion. - Initiated Albuterol inhaler 1 puff Q 6 hrs prn for SOB.  Other prn medications.  - acetaminophen 650 mg po Q 6 hrs prn for pain/fever. - Mylanta 30 ml po Q 6 hours prn for indigestion.  - MOM 30 ml po q daily prn for constipation.   Discharge Planning: Social work and case management to assist with discharge planning and identification of hospital follow-up needs prior to discharge Estimated LOS: 5-7 days Discharge Concerns: Need to establish a safety plan; Medication compliance and effectiveness Discharge Goals: Return home with outpatient referrals for mental health follow-up including medication management/psychotherapy   Observation Level/Precautions:  15 minute checks  Laboratory:   Per ED, current lab results reviewed, UDS (+) for cocaine.  Psychotherapy: Enrolled in the group sessions.  [facility]    Consultations: As needed.  Discharge Concerns: Safety, mood stabilization.   Estimated LOS: 2-4 days  Other: NA    Physician Treatment Plan for Primary Diagnosis: MDD (major depressive disorder)  Long Term Goal(s):  Improvement in symptoms so as ready for discharge  Short Term Goals: Ability to identify changes in lifestyle to reduce recurrence of condition will improve, Ability to verbalize feelings will improve, Ability to disclose and discuss suicidal ideas, and Ability to demonstrate self-control will improve  Physician Treatment Plan for Secondary Diagnosis: Principal Problem:   MDD (major depressive disorder)  Long Term Goal(s): Improvement in symptoms so as ready for discharge  Short Term Goals: Ability to identify and develop effective coping behaviors will improve, Ability to maintain clinical measurements within normal limits will improve, Compliance with prescribed medications will improve, and Ability to identify triggers associated with substance abuse/mental health issues will improve  I certify that inpatient services furnished can reasonably be expected to improve the patient's condition.    Armandina StammerAgnes Bennet Kujawa, NP, pmhnp, fnp-bc 6/16/20231:27 PM

## 2021-12-05 NOTE — Group Note (Signed)
Recreation Therapy Group Note   Group Topic:Stress Management  Group Date: 12/05/2021 Start Time: 0930 End Time: 0950 Facilitators: Caroll Rancher, LRT,CTRS Location: [address] Dayroom   Goal Area(s) Addresses:  Patient will actively participate in stress management techniques presented during session.  Patient will successfully identify benefit of practicing stress management post d/c.   Group Description:  Guided Imagery. LRT provided education, instruction, and demonstration on practice of visualization via guided imagery. Patient was asked to participate in the technique introduced during session. LRT debriefed including topics of mindfulness, stress management and specific scenarios each patient could use these techniques. Patients were given suggestions of ways to access scripts post d/c and encouraged to explore Youtube and other apps available on smartphones, tablets, and computers.   Affect/Mood: N/A   Participation Level: Did not attend    Clinical Observations/Individualized Feedback:     Plan: Continue to engage patient in RT group sessions 2-3x/week.   Caroll Rancher, LRT,CTRS 12/05/2021 12:00 PM

## 2021-12-05 NOTE — [facility] Group Notes (Signed)
[facility] Group Notes:  (Nursing/MHT/Case Management/Adjunct)  Date:  12/05/2021  Time:  1:32 PM  Type of Therapy:  Group Therapy  Participation Level:  Did Not Attend  Summary of Progress/Problems:  Patient did not attend orientation and goals group today due to being restricted to his bedroom.   Richard Moon [name] 12/05/2021, 1:32 PM

## 2021-12-05 NOTE — Progress Notes (Signed)
Pt attended AA meeting this evening.  

## 2021-12-05 NOTE — [facility] Suicide Risk Assessment (Signed)
[facility] INPATIENT:  Family/Significant Other Suicide Prevention Education  Suicide Prevention Education:  Patient Refusal for Family/Significant Other Suicide Prevention Education: The patient Richard Moon has refused to provide written consent for family/significant other to be provided Family/Significant Other Suicide Prevention Education during admission and/or prior to discharge.  Physician notified.  CSW completed SPE with patient. Discussed potential triggers leading to suicidal ideation in addition to coping skills one might use in order to delay and distract self from self harming behaviors. CSW encouraged patient to utilize emergency services if they felt unable to maintain their safety. SPE flyer provided to patient at this time.   Corky Crafts 12/05/2021, 2:34 PM

## 2021-12-05 NOTE — [facility] Group Notes (Signed)
[facility] Group Notes:  (Nursing/MHT/Case Management/Adjunct)  Date:  12/05/2021  Time:  2:00 PM  Type of Therapy:  Psychoeducational Skills   Participation Level:  Did Not Attend   Summary of Progress/Problems:   Patient did not attend a psycho-educational group involving selfcare today due to being restricted to his bedroom.   Richard Moon [name] 12/05/2021, 2:00 PM

## 2021-12-05 NOTE — Progress Notes (Signed)
   12/04/21 2055  Psych Admission Type (Psych Patients Only)  Admission Status Voluntary  Psychosocial Assessment  Patient Complaints Depression  Eye Contact Brief  Facial Expression Anxious  Affect Appropriate to circumstance  Speech Logical/coherent  Interaction Forwards little;Guarded  Motor Activity Slow  Appearance/Hygiene Body odor  Behavior Characteristics Cooperative;Appropriate to situation  Mood Anxious  Thought Process  Coherency Circumstantial  Content Blaming others (said they didn't do anything to help him at the hospital he came from)  Delusions None reported or observed  Perception WDL  Hallucination None reported or observed  Judgment Impaired  Confusion None  Danger to Self  Current suicidal ideation? Passive  Self-Injurious Behavior No self-injurious ideation or behavior indicators observed or expressed   Agreement Not to Harm Self Yes  Description of Agreement verbal  Danger to Others  Danger to Others None reported or observed   Progress note   D: Pt seen at nurse's station. Pt denies HI, AVH Endorses passive SI without a plan. Contracts for safety at [facility]. Pt rates pain  20/10 as pain in his legs d/t tortuous veins in the lower legs and being cold in his body all the time. Pt rates anxiety  0/10 and depression  9/10. Pt states that he came into the hospital because he was experiencing cold all over and pain in his legs. "They didn't even give me a Tylenol over there." Explained how tortuous veins can cause pain. Pt states that leg cramps will wake him out of his sleep. Pt is pleasant but guarded. No other complaints noted.   A: Pt provided support and encouragement. Pt given scheduled medication as prescribed. PRNs as appropriate. Q15 min checks for safety.   R: Pt safe on the unit. Will continue to monitor.
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
## 2021-12-05 NOTE — BH IP Treatment Plan (Signed)
Interdisciplinary Treatment and Diagnostic Plan Update  12/05/2021 Time of Session: 10:05am Sydney Azure MRN: [id number]  Principal Diagnosis: MDD (major depressive disorder)  Secondary Diagnoses: Principal Problem:   MDD (major depressive disorder)   Current Medications:  Current Facility-Administered Medications  Medication Dose Route Frequency Provider Last Rate Last Admin   acetaminophen (TYLENOL) tablet 650 mg  650 mg Oral Q6H PRN White, Patrice L, NP   650 mg at 12/04/21 2050   alum & mag hydroxide-simeth (MAALOX/MYLANTA) 200-200-20 MG/5ML suspension 30 mL  30 mL Oral Q4H PRN White, Patrice L, NP       magnesium hydroxide (MILK OF MAGNESIA) suspension 30 mL  30 mL Oral Daily PRN White, Patrice L, NP       traZODone (DESYREL) tablet 50 mg  50 mg Oral QHS PRN White, Patrice L, NP       PTA Medications: Medications Prior to Admission  Medication Sig Dispense Refill Last Dose   sertraline (ZOLOFT) 50 MG tablet Take 1 tablet (50 mg total) by mouth daily. (Patient not taking: Reported on 12/04/2021) 30 tablet 1    traZODone (DESYREL) 100 MG tablet Take 1 tablet (100 mg total) by mouth at bedtime as needed for sleep. (Patient not taking: Reported on 12/04/2021) 30 tablet 1     Patient Stressors: Other: "several things over the years"    Patient Strengths: Capable of independent living  Financial means  Supportive family/friends  Work skills   Treatment Modalities: Medication Management, Group therapy, Case management,  1 to 1 session with clinician, Psychoeducation, Recreational therapy.   Physician Treatment Plan for Primary Diagnosis: MDD (major depressive disorder) Long Term Goal(s): Improvement in symptoms so as ready for discharge   Short Term Goals: Ability to identify and develop effective coping behaviors will improve Ability to maintain clinical measurements within normal limits will improve Compliance with prescribed medications will improve Ability to identify  triggers associated with substance abuse/mental health issues will improve Ability to identify changes in lifestyle to reduce recurrence of condition will improve Ability to verbalize feelings will improve Ability to disclose and discuss suicidal ideas Ability to demonstrate self-control will improve  Medication Management: Evaluate patient's response, side effects, and tolerance of medication regimen.  Therapeutic Interventions: 1 to 1 sessions, Unit Group sessions and Medication administration.  Evaluation of Outcomes: Not Met  Physician Treatment Plan for Secondary Diagnosis: Principal Problem:   MDD (major depressive disorder)  Long Term Goal(s): Improvement in symptoms so as ready for discharge   Short Term Goals: Ability to identify and develop effective coping behaviors will improve Ability to maintain clinical measurements within normal limits will improve Compliance with prescribed medications will improve Ability to identify triggers associated with substance abuse/mental health issues will improve Ability to identify changes in lifestyle to reduce recurrence of condition will improve Ability to verbalize feelings will improve Ability to disclose and discuss suicidal ideas Ability to demonstrate self-control will improve     Medication Management: Evaluate patient's response, side effects, and tolerance of medication regimen.  Therapeutic Interventions: 1 to 1 sessions, Unit Group sessions and Medication administration.  Evaluation of Outcomes: Not Met   RN Treatment Plan for Primary Diagnosis: MDD (major depressive disorder) Long Term Goal(s): Knowledge of disease and therapeutic regimen to maintain health will improve  Short Term Goals: Ability to remain free from injury will improve, Ability to verbalize frustration and anger appropriately will improve, Ability to demonstrate self-control, Ability to participate in decision making will improve, Ability to verbalize  feelings  will improve, Ability to disclose and discuss suicidal ideas, Ability to identify and develop effective coping behaviors will improve, and Compliance with prescribed medications will improve  Medication Management: RN will administer medications as ordered by provider, will assess and evaluate patient's response and provide education to patient for prescribed medication. RN will report any adverse and/or side effects to prescribing provider.  Therapeutic Interventions: 1 on 1 counseling sessions, Psychoeducation, Medication administration, Evaluate responses to treatment, Monitor vital signs and CBGs as ordered, Perform/monitor CIWA, COWS, AIMS and Fall Risk screenings as ordered, Perform wound care treatments as ordered.  Evaluation of Outcomes: Not Met   LCSW Treatment Plan for Primary Diagnosis: MDD (major depressive disorder) Long Term Goal(s): Safe transition to appropriate next level of care at discharge, Engage patient in therapeutic group addressing interpersonal concerns.  Short Term Goals: Engage patient in aftercare planning with referrals and resources, Increase social support, Increase ability to appropriately verbalize feelings, Increase emotional regulation, Facilitate acceptance of mental health diagnosis and concerns, Facilitate patient progression through stages of change regarding substance use diagnoses and concerns, Identify triggers associated with mental health/substance abuse issues, and Increase skills for wellness and recovery  Therapeutic Interventions: Assess for all discharge needs, 1 to 1 time with Social worker, Explore available resources and support systems, Assess for adequacy in community support network, Educate family and significant other(s) on suicide prevention, Complete Psychosocial Assessment, Interpersonal group therapy.  Evaluation of Outcomes: Not Met   Progress in Treatment: Attending groups: No. Participating in groups: No. Taking  medication as prescribed: Yes. Toleration medication: Yes. Family/Significant other contact made: No, will contact:  CSW will identify support and contact Patient understands diagnosis: Yes. Discussing patient identified problems/goals with staff: Yes. Medical problems stabilized or resolved: Yes. Denies suicidal/homicidal ideation: Yes. Issues/concerns per patient self-inventory: No. Other: patient has a fever of 100.5, patient is being isolated to room until symptoms resolve to prevent illness from spreading.   New problem(s) identified: Yes, Describe:  fever of 100.5  New Short Term/Long Term Goal(s): detox, medication management for mood stabilization; elimination of SI thoughts; development of comprehensive mental wellness/sobriety plan    Patient Goals:  Patient states, "I would like to get my life back together and control my anxiety, depression and drug use"  Discharge Plan or Barriers: Patient recently admitted. CSW will continue to follow and assess for appropriate referrals and possible discharge planning.    Reason for Continuation of Hospitalization: Anxiety Depression Medication stabilization Suicidal ideation Withdrawal symptoms  Estimated Length of Stay: 3-7 days  Last [place] Suicide Severity Risk Score: [place] Admission (Current) from 12/04/2021 in [facility] ED from 12/03/2021 in [facility] [place] ED from 04/01/2021 in [place] [facility] High Risk High Risk Low Risk       Last [facility] 2/9 Scores:    03/31/2021    4:41 PM 05/25/2020    2:08 PM  Depression screen PHQ 2/9  Decreased Interest 2 3  Down, Depressed, Hopeless 2 3  PHQ - 2 Score 4 6  Altered sleeping 2 3  Tired, decreased energy 1 3  Change in appetite 1 3  Feeling bad or failure about yourself  2 3  Trouble concentrating 1 1  Moving slowly or fidgety/restless 1 3  Suicidal  thoughts 2 3  PHQ-9 Score 14 25  Difficult doing work/chores Somewhat difficult Very difficult    Scribe for Treatment Team: Brenya Taulbee E Minta Fair, LCSW  12/05/2021 10:48 AM

## 2021-12-05 NOTE — Progress Notes (Signed)
Patient appears depressed. Patient denies HI/AVH. Pt endorses SI with no plan or intent. PT states he just "doesn't feel good". Pt reports poor sleep due to his fever of 100.5. Pt says his sleep was disturbed by having to use the bathroom repeatedly. Due to pt fever he has been placed on room restrictions. Patient complied with morning medication with no reported side effects. Patient remains safe on Q70min checks and contracts for safety.      12/05/21 1000  Psych Admission Type (Psych Patients Only)  Admission Status Voluntary  Psychosocial Assessment  Patient Complaints Depression  Eye Contact Brief  Facial Expression Anxious  Affect Appropriate to circumstance  Speech Logical/coherent  Interaction Forwards little;Guarded  Motor Activity Slow  Appearance/Hygiene Body odor  Behavior Characteristics Cooperative  Mood Anxious;Depressed  Danger to Self  Current suicidal ideation? Passive  Self-Injurious Behavior No self-injurious ideation or behavior indicators observed or expressed   Agreement Not to Harm Self Yes  Description of Agreement verbal  Danger to Others  Danger to Others None reported or observed

## 2021-12-05 NOTE — Plan of Care (Signed)
  Problem: Education: Goal: Knowledge of [facility] Education information/materials will improve Outcome: Progressing Goal: Emotional status will improve Outcome: Progressing Goal: Mental status will improve Outcome: Progressing Goal: Verbalization of understanding the information provided will improve Outcome: Progressing   Problem: Activity: Goal: Interest or engagement in activities will improve Outcome: Progressing Goal: Sleeping patterns will improve Outcome: Progressing   Problem: Coping: Goal: Ability to verbalize frustrations and anger appropriately will improve Outcome: Progressing Goal: Ability to demonstrate self-control will improve Outcome: Progressing   Problem: Health Behavior/Discharge Planning: Goal: Identification of resources available to assist in meeting health care needs will improve Outcome: Progressing Goal: Compliance with treatment plan for underlying cause of condition will improve Outcome: Progressing   Problem: Safety: Goal: Periods of time without injury will increase Outcome: Progressing   Problem: Education: Goal: Ability to make informed decisions regarding treatment will improve Outcome: Progressing   Problem: Coping: Goal: Coping ability will improve Outcome: Progressing   Problem: Health Behavior/Discharge Planning: Goal: Identification of resources available to assist in meeting health care needs will improve Outcome: Progressing   Problem: Medication: Goal: Compliance with prescribed medication regimen will improve Outcome: Progressing   Problem: Self-Concept: Goal: Ability to disclose and discuss suicidal ideas will improve Outcome: Progressing Goal: Will verbalize positive feelings about self Outcome: Progressing   Problem: Education: Goal: Utilization of techniques to improve thought processes will improve Outcome: Progressing Goal: Knowledge of the prescribed therapeutic regimen will improve Outcome:  Progressing   Problem: Activity: Goal: Interest or engagement in leisure activities will improve Outcome: Progressing Goal: Imbalance in normal sleep/wake cycle will improve Outcome: Progressing   Problem: Coping: Goal: Coping ability will improve Outcome: Progressing Goal: Will verbalize feelings Outcome: Progressing   Problem: Health Behavior/Discharge Planning: Goal: Ability to make decisions will improve Outcome: Progressing Goal: Compliance with therapeutic regimen will improve Outcome: Progressing   Problem: Role Relationship: Goal: Will demonstrate positive changes in social behaviors and relationships Outcome: Progressing   Problem: Safety: Goal: Ability to disclose and discuss suicidal ideas will improve Outcome: Progressing Goal: Ability to identify and utilize support systems that promote safety will improve Outcome: Progressing   Problem: Self-Concept: Goal: Will verbalize positive feelings about self Outcome: Progressing Goal: Level of anxiety will decrease Outcome: Progressing   Problem: Education: Goal: Knowledge of disease or condition will improve Outcome: Progressing Goal: Understanding of discharge needs will improve Outcome: Progressing   Problem: Health Behavior/Discharge Planning: Goal: Ability to identify changes in lifestyle to reduce recurrence of condition will improve Outcome: Progressing Goal: Identification of resources available to assist in meeting health care needs will improve Outcome: Progressing   Problem: Physical Regulation: Goal: Complications related to the disease process, condition or treatment will be avoided or minimized Outcome: Progressing   Problem: Safety: Goal: Ability to remain free from injury will improve Outcome: Progressing   Problem: Physical Regulation: Goal: Ability to maintain clinical measurements within normal limits will improve Outcome: Not Progressing

## 2021-12-05 NOTE — Group Note (Unsigned)
LCSW Group Therapy Note  Group Date: 12/05/2021 Start Time: 1300 End Time: 1400   Type of Therapy and Topic:  Group Therapy - Healthy vs Unhealthy Coping Skills  Participation Level:  {[facility] PARTICIPATION LEVEL:22264}   Description of Group The focus of this group was to determine what unhealthy coping techniques typically are used by group members and what healthy coping techniques would be helpful in coping with various problems. Patients were guided in becoming aware of the differences between healthy and unhealthy coping techniques. Patients were asked to identify 2-3 healthy coping skills they would like to learn to use more effectively.  Therapeutic Goals 1. Patients learned that coping is what human beings do all day long to deal with various situations in their lives 2. Patients defined and discussed healthy vs unhealthy coping techniques 3. Patients identified their preferred coping techniques and identified whether these were healthy or unhealthy 4. Patients determined 2-3 healthy coping skills they would like to become more familiar with and use more often. 5. Patients provided support and ideas to each other   Summary of Patient Progress:  During group, *** expressed ***. Patient proved open to input from peers and feedback from CSW. Patient demonstrated *** insight into the subject matter, was respectful of peers, and participated throughout the entire session.   Therapeutic Modalities Cognitive Behavioral Therapy Motivational Interviewing  Aprile Dickenson W Lucious Zou, LCSWA 12/05/2021  1:07 PM   

## 2021-12-05 NOTE — [facility] Suicide Risk Assessment (Cosign Needed)
Suicide Risk Assessment  Admission Assessment    [facility] Admission Suicide Risk Assessment   Nursing information obtained from:     Demographic factors:  Male  Current Mental Status:  Suicidal ideation indicated by patient  Loss Factors:  NA  Historical Factors:  Prior suicide attempts  Risk Reduction Factors:  Employed, Living with another person, especially a relative  Total Time spent with patient: 1 hour  Principal Problem: MDD (major depressive disorder)  Diagnosis:  Principal Problem:   MDD (major depressive disorder)  Subjective Data: See H&P  Continued Clinical Symptoms:  Alcohol Use Disorder Identification Test Final Score (AUDIT): 4 The "Alcohol Use Disorders Identification Test", Guidelines for Use in Primary Care, Second Edition.  World Science writer [facility]). Score between 0-7:  no or low risk or alcohol related problems. Score between 8-15:  moderate risk of alcohol related problems. Score between 16-19:  high risk of alcohol related problems. Score 20 or above:  warrants further diagnostic evaluation for alcohol dependence and treatment.  CLINICAL FACTORS:   Depression:   Hopelessness Impulsivity Insomnia Alcohol/Substance Abuse/Dependencies More than one psychiatric diagnosis Previous Psychiatric Diagnoses and Treatments  Musculoskeletal: Strength & Muscle Tone: within normal limits Gait & Station: normal Patient leans: N/A  Psychiatric Specialty Exam:  Presentation  General Appearance: Casual; Fairly Groomed  Eye Contact:Fair  Speech:Clear and Coherent; Normal Rate  Speech Volume:Normal  Handedness:Right  Mood and Affect  Mood:Anxious; Depressed; Hopeless  Affect:Congruent; Depressed; Flat  Thought Process  Thought Processes:Coherent; Goal Directed  Descriptions of Associations:Intact  Orientation:Full (Time, Place and Person)  Thought Content:Logical  History of Schizophrenia/Schizoaffective disorder:No  Duration of  Psychotic Symptoms:No data recorded Hallucinations:Hallucinations: None  Ideas of Reference:None  Suicidal Thoughts:Suicidal Thoughts: Yes, Passive SI Passive Intent and/or Plan: Without Intent; Without Plan; Without Means to Carry Out; Without Access to Means  Homicidal Thoughts:Homicidal Thoughts: No  Sensorium  Memory:Immediate Good; Recent Good; Remote Good  Judgment:Fair  Insight:Good  Executive Functions  Concentration:Good  Attention Span:Good  Recall:Good  Fund of Knowledge:Good  Language:Good   Psychomotor Activity  Psychomotor Activity:Psychomotor Activity: Normal  Assets  Assets:Communication Skills; Desire for Improvement; Financial Resources/Insurance; Housing; Social Support; Physical Health  Sleep  Sleep:Sleep: Good Number of Hours of Sleep: 7.75  Physical Exam: See H&P  Blood pressure 102/84, pulse (!) 118, temperature (!) 100.5 F (38.1 C), temperature source Oral, resp. rate 16, height 5\' 11"  (1.803 m), weight 65 kg, SpO2 95 %. Body mass index is 19.97 kg/m.  COGNITIVE FEATURES THAT CONTRIBUTE TO RISK:  Closed-mindedness, Polarized thinking, and Thought constriction (tunnel vision)    SUICIDE RISK:   Severe:  Frequent, intense, and enduring suicidal ideation, specific plan, no subjective intent, but some objective markers of intent (i.e., choice of lethal method), the method is accessible, some limited preparatory behavior, evidence of impaired self-control, severe dysphoria/symptomatology, multiple risk factors present, and few if any protective factors, particularly a lack of social support.  PLAN OF CARE: See H&P.  I certify that inpatient services furnished can reasonably be expected to improve the patient's condition.   , NP, pmhnp, [id number]. 12/05/2021, 1:00 PM

## 2021-12-06 LAB — CBC WITH DIFFERENTIAL/PLATELET
Abs Immature Granulocytes: 0.02 10*3/uL (ref 0.00–0.07)
Basophils Absolute: 0 10*3/uL (ref 0.0–0.1)
Basophils Relative: 0 %
Eosinophils Absolute: 0.3 10*3/uL (ref 0.0–0.5)
Eosinophils Relative: 7 %
HCT: 52.6 % — ABNORMAL HIGH (ref 39.0–52.0)
Hemoglobin: 17 g/dL (ref 13.0–17.0)
Immature Granulocytes: 0 %
Lymphocytes Relative: 51 %
Lymphs Abs: 2.3 10*3/uL (ref 0.7–4.0)
MCH: 32.8 pg (ref 26.0–34.0)
MCHC: 32.3 g/dL (ref 30.0–36.0)
MCV: 101.3 fL — ABNORMAL HIGH (ref 80.0–100.0)
Monocytes Absolute: 0.5 10*3/uL (ref 0.1–1.0)
Monocytes Relative: 11 %
Neutro Abs: 1.4 10*3/uL — ABNORMAL LOW (ref 1.7–7.7)
Neutrophils Relative %: 31 %
Platelets: 196 10*3/uL (ref 150–400)
RBC: 5.19 MIL/uL (ref 4.22–5.81)
RDW: 13.1 % (ref 11.5–15.5)
WBC: 4.6 10*3/uL (ref 4.0–10.5)
nRBC: 0 % (ref 0.0–0.2)

## 2021-12-06 NOTE — Progress Notes (Signed)
[facility] Group Notes:  (Nursing/MHT/Case Management/Adjunct)  Date:  12/06/2021  Time:  2015  Type of Therapy:   wrap up group  Participation Level:  Active  Participation Quality:  Appropriate  Affect:  Flat  Cognitive:  Alert  Insight:  Improving  Engagement in Group:  Engaged  Modes of Intervention:  Clarification, Education, and Support  Summary of Progress/Problems: Positive thinking and positive change were discussed.   Richard Moon 12/06/2021, 9:23 PM

## 2021-12-06 NOTE — Progress Notes (Signed)
Patient appears depressed. Patient denies SI/HI/AVH. Patient complied with morning medication with no reported side effects. Pt's temp at 0750 was 98.9. Pt appears brighter and has begun attending group. Patient remains safe on Q18min checks and contracts for safety.      12/06/21 1020  Psych Admission Type (Psych Patients Only)  Admission Status Voluntary  Psychosocial Assessment  Patient Complaints Depression;Sadness  Eye Contact Brief  Facial Expression Sad  Affect Appropriate to circumstance  Speech Logical/coherent  Interaction Guarded  Motor Activity Slow  Appearance/Hygiene Poor hygiene  Behavior Characteristics Appropriate to situation  Mood Depressed;Sad  Thought Process  Coherency Circumstantial  Content Blaming others  Delusions None reported or observed  Perception WDL  Hallucination None reported or observed  Judgment Poor  Confusion WDL  Danger to Self  Current suicidal ideation? Denies  Self-Injurious Behavior No self-injurious ideation or behavior indicators observed or expressed   Agreement Not to Harm Self Yes  Description of Agreement verbal  Danger to Others  Danger to Others None reported or observed

## 2021-12-06 NOTE — Group Note (Deleted)
LCSW Group Therapy Note   Group Date: 12/06/2021 Start Time: 1000 End Time: 1100   Type of Therapy and Topic:  Group Therapy:   Participation Level:  {[facility] PARTICIPATION LEVEL:22264}  Description of Group:   Therapeutic Goals:  1.     Summary of Patient Progress:    ***  Therapeutic Modalities:   Ahmed Inniss J Grossman-Orr, LCSWA 12/06/2021  9:41 AM    

## 2021-12-06 NOTE — [facility] Group Notes (Signed)
    Purpose of Group: . The group focus' on teaching patients on how to identify their needs and their Life Skills:  A group where two lists are made. What people need and what are things that we do that are unhealthy. The lists are developed by the patients and it is explained that we often do the actions that are not healthy to get our list of needs met.  Goal:: to develop the coping skills needed to get their needs met  Participation Level:  Active  Participation Quality:  Appropriate  Affect:  Appropriate  Cognitive:  Oriented  Insight:  Improving  Engagement in Group:  Engaged  Additional Comments: Pt rates his energy at a 5/10. Participated fin the group.

## 2021-12-06 NOTE — Group Note (Unsigned)
Date:  12/06/2021 Time:  9:31 AM  Group Topic/Focus:  Orientation:   The focus of this group is to educate the patient on the purpose and policies of crisis stabilization and provide a format to answer questions about their admission.  The group details unit policies and expectations of patients while admitted.     Participation Level:  {[facility] PARTICIPATION LEVEL:22264}  Participation Quality:  {[facility] PARTICIPATION QUALITY:22265}  Affect:  {[facility] AFFECT:22266}  Cognitive:  {[facility] COGNITIVE:22267}  Insight: {[facility] Insight2:20797}  Engagement in Group:  {[facility] ENGAGEMENT IN GROUP:22268}  Modes of Intervention:  {[facility] MODES OF INTERVENTION:22269}  Additional Comments:  ***  Mellina Benison Lashawn Allison Deshotels 12/06/2021, 9:31 AM  

## 2021-12-06 NOTE — Plan of Care (Signed)
  Problem: Education: Goal: Knowledge of [facility] Education information/materials will improve Outcome: Progressing Goal: Emotional status will improve Outcome: Progressing Goal: Mental status will improve Outcome: Progressing Goal: Verbalization of understanding the information provided will improve Outcome: Progressing   Problem: Activity: Goal: Interest or engagement in activities will improve Outcome: Progressing Goal: Sleeping patterns will improve Outcome: Progressing   Problem: Coping: Goal: Ability to verbalize frustrations and anger appropriately will improve Outcome: Progressing Goal: Ability to demonstrate self-control will improve Outcome: Progressing   Problem: Health Behavior/Discharge Planning: Goal: Identification of resources available to assist in meeting health care needs will improve Outcome: Progressing Goal: Compliance with treatment plan for underlying cause of condition will improve Outcome: Progressing   Problem: Physical Regulation: Goal: Ability to maintain clinical measurements within normal limits will improve Outcome: Progressing   Problem: Safety: Goal: Periods of time without injury will increase Outcome: Progressing   Problem: Education: Goal: Ability to make informed decisions regarding treatment will improve Outcome: Progressing   Problem: Coping: Goal: Coping ability will improve Outcome: Progressing   Problem: Health Behavior/Discharge Planning: Goal: Identification of resources available to assist in meeting health care needs will improve Outcome: Progressing   Problem: Medication: Goal: Compliance with prescribed medication regimen will improve Outcome: Progressing   Problem: Self-Concept: Goal: Ability to disclose and discuss suicidal ideas will improve Outcome: Progressing Goal: Will verbalize positive feelings about self Outcome: Progressing   Problem: Education: Goal: Utilization of techniques to improve  thought processes will improve Outcome: Progressing Goal: Knowledge of the prescribed therapeutic regimen will improve Outcome: Progressing   Problem: Activity: Goal: Interest or engagement in leisure activities will improve Outcome: Progressing Goal: Imbalance in normal sleep/wake cycle will improve Outcome: Progressing   Problem: Coping: Goal: Coping ability will improve Outcome: Progressing Goal: Will verbalize feelings Outcome: Progressing   Problem: Health Behavior/Discharge Planning: Goal: Ability to make decisions will improve Outcome: Progressing Goal: Compliance with therapeutic regimen will improve Outcome: Progressing   Problem: Role Relationship: Goal: Will demonstrate positive changes in social behaviors and relationships Outcome: Progressing   Problem: Safety: Goal: Ability to disclose and discuss suicidal ideas will improve Outcome: Progressing Goal: Ability to identify and utilize support systems that promote safety will improve Outcome: Progressing   Problem: Self-Concept: Goal: Will verbalize positive feelings about self Outcome: Progressing Goal: Level of anxiety will decrease Outcome: Progressing   Problem: Education: Goal: Knowledge of disease or condition will improve Outcome: Progressing Goal: Understanding of discharge needs will improve Outcome: Progressing   Problem: Health Behavior/Discharge Planning: Goal: Ability to identify changes in lifestyle to reduce recurrence of condition will improve Outcome: Progressing Goal: Identification of resources available to assist in meeting health care needs will improve Outcome: Progressing   Problem: Physical Regulation: Goal: Complications related to the disease process, condition or treatment will be avoided or minimized Outcome: Progressing   Problem: Safety: Goal: Ability to remain free from injury will improve Outcome: Progressing   

## 2021-12-06 NOTE — [facility] Group Notes (Signed)
.  Psychoeducational Group Note  Date: 12/06/2021 Time: 0900-1000    Goal Setting   Purpose of Group: This group helps to provide patients with the steps of setting a goal that is specific, measurable, attainable, realistic and time specific. A discussion on how we keep ourselves stuck with negative self talk. Homework given for Patients to write 30 positive attributes about themselves.    Participation Level:  did not attend  Richard Moon A 

## 2021-12-06 NOTE — Group Note (Signed)
LCSW Group Therapy Note  12/06/2021   10:00-11:00am   Type of Therapy and Topic:  Group Therapy: Anger Cues and Responses  Participation Level:  Minimal   Description of Group:   In this group, patients learned how to recognize the physical, cognitive, emotional, and behavioral responses they have to anger-provoking situations.  They identified a recent time they became angry and how they reacted.  They analyzed how their reaction was possibly beneficial and how it was possibly unhelpful.  The group discussed a variety of healthier coping skills that could help with such a situation in the future.  They also learned that anger is a second emotion fueled by other feelings and explored their own emotions that may frequently fuel their anger.  Focus was placed on how helpful it is to recognize the underlying emotions to our anger, because working on those can lead to a more permanent solution as well as our ability to focus on the important rather than the urgent.  Therapeutic Goals: Patients will remember their last incident of anger and how they felt emotionally and physically, what their thoughts were at the time, and how they behaved. Patients will identify how their behavior at that time worked for them, as well as how it worked against them. Patients will explore possible new behaviors to use in future anger situations. Patients will learn that anger itself is normal and cannot be eliminated, and that healthier reactions can assist with resolving conflict rather than worsening situations. Patients will learn that anger is a secondary emotion and worked to identify some of the underlying feelings that may lead to anger.  Summary of Patient Progress:  The patient shared that his most recent time of anger was yesterday and said he felt misunderstood, but he thought about it and reasoned the situation through.  He did not talk during group at any other time.  It was unclear if he was listening to  others.  Therapeutic Modalities:   Cognitive Behavioral Therapy  Lynnell Chad

## 2021-12-06 NOTE — Progress Notes (Signed)
[facility] MD Progress Note  12/06/2021 8:46 AM Richard Moon  MRN:  [id number]  Subjective: Richard Moon reports, "I'm doing a little better. My cough is getting better & my temperature is going down. But my mood is still muggy & sad".  Reason for admission:  61 year old AA male with prior hx of major depressive disorder & stimulant (cocaine) use disorder. He is known in this [facility] & the [facility] for complain of worsening symptoms of depression, drug use & or suicidal ideations. Chart review reports indicated that patient has been seen at the other psychiatric hospitals within the surrounding areas with similar complaints. He is admitted to the [facility] this time around with complaint of suicidal ideations with plan to walk into traffic.   Daily notes: Richard Moon is seen in his room. He is lying down in bed. He presents alert, oriented & aware of situation. He is visible on the unit during meal & medication administration times. He is verbally responsive, making a fair eye contact. He says his cough is better as he is no longer coughing as much as he was yesterday. Reported it as a dry cough, no sputum production. He denies any shortness of breath. He denies any chest pain. His vital signs are stable. Patient is currently afebrile. The chest xray ordered has been cancelled as there is a chest Xray done on 12-04-21 which shows no issues or problems. Patient however reports his mood as muggy/sad. He denies any SIHI, AVH, delusional thoughts or paranoia. He rates his depression today #6 & anxiety #4.  He is taking & tolerating his treatment regimen, denies any side effects. There are no changes made on his current plan of care. Will continue as already in progress. Staff continues to provide support & encouragement.  Principal Problem: MDD (major depressive disorder)  Diagnosis: Principal Problem:   MDD (major depressive disorder)  Total Time spent with patient:  35 minutes  Past Psychiatric History: Major depressive disorder,  Stimulant use disorder.  Past Medical History: History reviewed. No pertinent past medical history.  Past Surgical History:  Procedure Laterality Date   HERNIA REPAIR     Family History: History reviewed. No pertinent family history.  Family Psychiatric  History: See H&P.  Social History:  Social History   Substance and Sexual Activity  Alcohol Use Yes   Comment: Occasional alcohol use     Social History   Substance and Sexual Activity  Drug Use Yes   Types: Marijuana, Cocaine, Heroin   Comment: daily cocaine use (reported $200-300/ daily); occasional cannabis use    Social History   Socioeconomic History   Marital status: Single    Spouse name: Not on file   Number of children: Not on file   Years of education: Not on file   Highest education level: Not on file  Occupational History   Not on file  Tobacco Use   Smoking status: Some Days    Packs/day: 0.25    Years: 20.00    Total pack years: 5.00    Types: Cigarettes   Smokeless tobacco: Never  Vaping Use   Vaping Use: Every day  Substance and Sexual Activity   Alcohol use: Yes    Comment: Occasional alcohol use   Drug use: Yes    Types: Marijuana, Cocaine, Heroin    Comment: daily cocaine use (reported $200-300/ daily); occasional cannabis use   Sexual activity: Yes    Birth control/protection: Condom  Other Topics Concern   Not on file  Social History Narrative   Not on file   Social Determinants of Health   Financial Resource Strain: Not on file  Food Insecurity: Not on file  Transportation Needs: Not on file  Physical Activity: Not on file  Stress: Not on file  Social Connections: Not on file   Additional Social History:    Sleep: Good  Appetite:  Good  Current Medications: Current Facility-Administered Medications  Medication Dose Route Frequency Provider Last Rate Last Admin   acetaminophen (TYLENOL) tablet 650 mg  650 mg Oral Q6H PRN White, Patrice L, NP   650 mg at 12/05/21 1743    albuterol (VENTOLIN HFA) 108 (90 Base) MCG/ACT inhaler 1 puff  1 puff Inhalation Q6H PRN Feliz Lincoln, Nicole Kindred I, NP       alum & mag hydroxide-simeth (MAALOX/MYLANTA) 200-200-20 MG/5ML suspension 30 mL  30 mL Oral Q4H PRN White, Patrice L, NP       guaiFENesin (MUCINEX) 12 hr tablet 600 mg  600 mg Oral BID Armandina Stammer I, NP   600 mg at 12/05/21 1700   hydrOXYzine (ATARAX) tablet 25 mg  25 mg Oral TID PRN Armandina Stammer I, NP       magnesium hydroxide (MILK OF MAGNESIA) suspension 30 mL  30 mL Oral Daily PRN White, Patrice L, NP       sertraline (ZOLOFT) tablet 50 mg  50 mg Oral Daily Zoriana Oats I, NP   50 mg at 12/05/21 1330   traZODone (DESYREL) tablet 50 mg  50 mg Oral QHS PRN White, Patrice L, NP       Lab Results:  Results for orders placed or performed during the hospital encounter of 12/04/21 (from the past 48 hour(s))  Resp Panel by RT-PCR (Flu A&B, Covid) Anterior Nasal Swab     Status: None   Collection Time: 12/05/21 11:00 AM   Specimen: Anterior Nasal Swab  Result Value Ref Range   SARS Coronavirus 2 by RT PCR NEGATIVE NEGATIVE    Comment: (NOTE) SARS-CoV-2 target nucleic acids are NOT DETECTED.  The SARS-CoV-2 RNA is generally detectable in upper respiratory specimens during the acute phase of infection. The lowest concentration of SARS-CoV-2 viral copies this assay can detect is 138 copies/mL. A negative result does not preclude SARS-Cov-2 infection and should not be used as the sole basis for treatment or other patient management decisions. A negative result may occur with  improper specimen collection/handling, submission of specimen other than nasopharyngeal swab, presence of viral mutation(s) within the areas targeted by this assay, and inadequate number of viral copies(<138 copies/mL). A negative result must be combined with clinical observations, patient history, and epidemiological information. The expected result is Negative.  Fact Sheet for Patients:   BloggerCourse.com  Fact Sheet for Healthcare Providers:  SeriousBroker.it  This test is no t yet approved or cleared by the [place] FDA and  has been authorized for detection and/or diagnosis of SARS-CoV-2 by FDA under an Emergency Use Authorization (EUA). This EUA will remain  in effect (meaning this test can be used) for the duration of the COVID-19 declaration under Section 564(b)(1) of the Act, 21 U.S.C.section 360bbb-3(b)(1), unless the authorization is terminated  or revoked sooner.       Influenza A by PCR NEGATIVE NEGATIVE   Influenza B by PCR NEGATIVE NEGATIVE    Comment: (NOTE) The Xpert Xpress SARS-CoV-2/FLU/RSV plus assay is intended as an aid in the diagnosis of influenza from Nasopharyngeal swab specimens and should not be used as a  sole basis for treatment. Nasal washings and aspirates are unacceptable for Xpert Xpress SARS-CoV-2/FLU/RSV testing.  Fact Sheet for Patients: BloggerCourse.com  Fact Sheet for Healthcare Providers: SeriousBroker.it  This test is not yet approved or cleared by the [place] FDA and has been authorized for detection and/or diagnosis of SARS-CoV-2 by FDA under an Emergency Use Authorization (EUA). This EUA will remain in effect (meaning this test can be used) for the duration of the COVID-19 declaration under Section 564(b)(1) of the Act, 21 U.S.C. section 360bbb-3(b)(1), unless the authorization is terminated or revoked.  Performed at [facility], [address], [place], [place] [address]    Blood Alcohol level:  Lab Results  Component Value Date   ETH <10 12/04/2021   ETH <10 03/30/2021   Metabolic Disorder Labs: Lab Results  Component Value Date   HGBA1C 5.8 (H) 04/02/2021   MPG 119.76 04/02/2021   No results found for: "PROLACTIN" Lab Results  Component Value Date   CHOL 190 04/02/2021    TRIG 66 04/02/2021   HDL 78 04/02/2021   CHOLHDL 2.4 04/02/2021   VLDL 13 04/02/2021   LDLCALC 99 04/02/2021   Physical Findings: AIMS: Facial and Oral Movements Muscles of Facial Expression: None, normal Lips and Perioral Area: None, normal Jaw: None, normal Tongue: None, normal,Extremity Movements Upper (arms, wrists, hands, fingers): None, normal Lower (legs, knees, ankles, toes): None, normal, Trunk Movements Neck, shoulders, hips: None, normal, Overall Severity Severity of abnormal movements (highest score from questions above): None, normal Incapacitation due to abnormal movements: None, normal Patient's awareness of abnormal movements (rate only patient's report): No Awareness, Dental Status Current problems with teeth and/or dentures?: No Does patient usually wear dentures?: No  CIWA:    COWS:     Musculoskeletal: Strength & Muscle Tone: within normal limits Gait & Station: normal Patient leans: N/A  Psychiatric Specialty Exam:  Presentation  General Appearance: Casual; Fairly Groomed  Eye Contact:Fair  Speech:Clear and Coherent; Normal Rate  Speech Volume:Normal  Handedness:Right  Mood and Affect  Mood:Anxious; Depressed; Hopeless  Affect:Congruent; Depressed; Flat  Thought Process  Thought Processes:Coherent; Goal Directed  Descriptions of Associations:Intact  Orientation:Full (Time, Place and Person)  Thought Content:Logical  History of Schizophrenia/Schizoaffective disorder:No  Duration of Psychotic Symptoms:No data recorded Hallucinations:Hallucinations: None  Ideas of Reference:None  Suicidal Thoughts:Suicidal Thoughts: Yes, Passive SI Passive Intent and/or Plan: Without Intent; Without Plan; Without Means to Carry Out; Without Access to Means  Homicidal Thoughts:Homicidal Thoughts: No  Sensorium  Memory:Immediate Good; Recent Good; Remote Good  Judgment:Fair  Insight:Good  Executive Functions  Concentration:Good  Attention  Span:Good  Recall:Good  Fund of Knowledge:Good  Language:Good  Psychomotor Activity  Psychomotor Activity:Psychomotor Activity: Normal  Assets  Assets:Communication Skills; Desire for Improvement; Financial Resources/Insurance; Housing; Social Support; Physical Health  Sleep  Sleep:Sleep: Good Number of Hours of Sleep: 7.75  Physical Exam: Physical Exam Vitals and nursing note reviewed.  HENT:     Nose: Nose normal.     Mouth/Throat:     Pharynx: Oropharynx is clear.  Cardiovascular:     Rate and Rhythm: Normal rate.     Pulses: Normal pulses.  Pulmonary:     Effort: Pulmonary effort is normal.  Genitourinary:    Comments: Deferred Musculoskeletal:        General: Normal range of motion.     Cervical back: Normal range of motion.  Skin:    General: Skin is warm.  Neurological:     General: No focal deficit present.  Mental Status: He is alert and oriented to person, place, and time.    Review of Systems  Constitutional:  Negative for chills, diaphoresis and fever.  HENT:  Negative for congestion and sore throat.   Respiratory:  Negative for cough, shortness of breath and wheezing.   Cardiovascular:  Negative for chest pain and palpitations.  Gastrointestinal:  Negative for abdominal pain, constipation, diarrhea, heartburn, nausea and vomiting.  Neurological:  Negative for dizziness, tingling, tremors, sensory change, speech change, focal weakness, seizures, loss of consciousness, weakness and headaches.  Endo/Heme/Allergies:        Allergies: NKDA  Psychiatric/Behavioral:  Positive for depression and substance abuse (Hx cocaine use disorder.). Negative for hallucinations, memory loss and suicidal ideas. The patient is not nervous/anxious and does not have insomnia.    Blood pressure 105/75, pulse 75, temperature 98.9 F (37.2 C), temperature source Oral, resp. rate 16, height 5\' 11"  (1.803 m), weight 65 kg, SpO2 97 %. Body mass index is 19.97  kg/m.  Treatment Plan Summary: Daily contact with patient to assess and evaluate symptoms and progress in treatment and Medication management.   Continue inpatient hospitalization.  Will continue today 12/06/2021 plan as below except where it is noted.    Diagnoses:  Major depressive disorder, recurrent episodes. Stimulant (cocaine) use disorder, chronic.   Plan. - Continue Sertraline 50 mg po daily for depression. - Continue Hydroxyzine 25 mg po tid prn for anxiety.  - Continue Trazodone 50 mg po Q hs prn for insomnia.   Other medical complaints. - Continue Mucines 600 mg po bid x 5 days for cough/congestion. - Continue Albuterol inhaler 1 puff Q 6 hrs prn for SOB.   Other prn medications.  - acetaminophen 650 mg po Q 6 hrs prn for pain/fever. - Mylanta 30 ml po Q 6 hours prn for indigestion.  - MOM 30 ml po q daily prn for constipation.    Discharge Planning: Social work and case management to assist with discharge planning and identification of hospital follow-up needs prior to discharge Estimated LOS: 5-7 days Discharge Concerns: Need to establish a safety plan; Medication compliance and effectiveness Discharge Goals: Return home with outpatient referrals for mental health follow-up including medication management/psychotherapy   12/08/2021, NP, pmhnp, [id number] 12/06/2021, 8:46 AM

## 2021-12-07 DIAGNOSIS — F331 Major depressive disorder, recurrent, moderate: Secondary | ICD-10-CM

## 2021-12-07 NOTE — Progress Notes (Signed)
   12/07/21 2336  Psych Admission Type (Psych Patients Only)  Admission Status Voluntary  Psychosocial Assessment  Patient Complaints None  Eye Contact Fair  Facial Expression Flat  Affect Appropriate to circumstance  Speech Logical/coherent  Interaction Assertive  Motor Activity Other (Comment) (WDL)  Appearance/Hygiene Improved  Behavior Characteristics Appropriate to situation  Mood Depressed;Pleasant  Thought Process  Coherency WDL  Content WDL  Delusions None reported or observed  Perception WDL  Hallucination None reported or observed  Judgment Poor  Confusion None  Danger to Self  Current suicidal ideation? Denies  Self-Injurious Behavior No self-injurious ideation or behavior indicators observed or expressed   Agreement Not to Harm Self Yes  Description of Agreement verbal  Danger to Others  Danger to Others None reported or observed

## 2021-12-07 NOTE — Progress Notes (Signed)
[facility] MD Progress Note  12/07/2021 11:52 AM Richard Moon  MRN:  [id number]  Subjective: [name] reports, My mood is good today. I'm feeling a lot better".  Reason for admission:  61 year old AA male with prior hx of major depressive disorder & stimulant (cocaine) use disorder. He is known in this [facility] & the [facility] for complain of worsening symptoms of depression, drug use & or suicidal ideations. Chart review reports indicated that patient has been seen at the other psychiatric hospitals within the surrounding areas with similar complaints. He is admitted to the [facility] this time around with complaint of suicidal ideations with plan to walk into traffic.   Daily notes: [name] is seen in his room. He is lying down in bed. He presents alert, oriented & aware of situation. He is visible on the unit during meal & medication administration times. He is verbally responsive, making a good eye contact. He says his cough has improved. Reported it as a dry cough, no sputum production. He denies any shortness of breath. He denies any chest pain. His vital signs are stable. Patient is currently afebrile. Patient reports that he is in a god mood today. He denies any SIHI, AVH, delusional thoughts or paranoia. He rates his depression today #2 & anxiety #1 in the scale of 1-10 (10 being the worst & 1 being the least.  He is taking & tolerating his treatment regimen, denies any side effects. There are no changes made on his current plan of care. Will continue as already in progress. Staff continues to provide support & encouragement. May be discharged in tomorrow morning.  Principal Problem: MDD (major depressive disorder)  Diagnosis: Principal Problem:   MDD (major depressive disorder)  Total Time spent with patient:  35 minutes  Past Psychiatric History: Major depressive disorder, Stimulant use disorder.  Past Medical History: History reviewed. No pertinent past medical history.  Past Surgical History:  Procedure  Laterality Date   HERNIA REPAIR     Family History: History reviewed. No pertinent family history.  Family Psychiatric  History: See H&P.  Social History:  Social History   Substance and Sexual Activity  Alcohol Use Yes   Comment: Occasional alcohol use     Social History   Substance and Sexual Activity  Drug Use Yes   Types: Marijuana, Cocaine, Heroin   Comment: daily cocaine use (reported $200-300/ daily); occasional cannabis use    Social History   Socioeconomic History   Marital status: Single    Spouse name: Not on file   Number of children: Not on file   Years of education: Not on file   Highest education level: Not on file  Occupational History   Not on file  Tobacco Use   Smoking status: Some Days    Packs/day: 0.25    Years: 20.00    Total pack years: 5.00    Types: Cigarettes   Smokeless tobacco: Never  Vaping Use   Vaping Use: Every day  Substance and Sexual Activity   Alcohol use: Yes    Comment: Occasional alcohol use   Drug use: Yes    Types: Marijuana, Cocaine, Heroin    Comment: daily cocaine use (reported $200-300/ daily); occasional cannabis use   Sexual activity: Yes    Birth control/protection: Condom  Other Topics Concern   Not on file  Social History Narrative   Not on file   Social Determinants of Health   Financial Resource Strain: Not on file  Food Insecurity:  Not on file  Transportation Needs: Not on file  Physical Activity: Not on file  Stress: Not on file  Social Connections: Not on file   Additional Social History:    Sleep: Good  Appetite:  Good  Current Medications: Current Facility-Administered Medications  Medication Dose Route Frequency Provider Last Rate Last Admin   acetaminophen (TYLENOL) tablet 650 mg  650 mg Oral Q6H PRN White, Patrice L, NP   650 mg at 12/05/21 1743   albuterol (VENTOLIN HFA) 108 (90 Base) MCG/ACT inhaler 1 puff  1 puff Inhalation Q6H PRN Cam Harnden, Nicole Kindred I, NP       alum & mag  hydroxide-simeth (MAALOX/MYLANTA) 200-200-20 MG/5ML suspension 30 mL  30 mL Oral Q4H PRN White, Patrice L, NP       guaiFENesin (MUCINEX) 12 hr tablet 600 mg  600 mg Oral BID Armandina Stammer I, NP   600 mg at 12/07/21 7989   hydrOXYzine (ATARAX) tablet 25 mg  25 mg Oral TID PRN Armandina Stammer I, NP   25 mg at 12/06/21 2341   magnesium hydroxide (MILK OF MAGNESIA) suspension 30 mL  30 mL Oral Daily PRN White, Patrice L, NP       sertraline (ZOLOFT) tablet 50 mg  50 mg Oral Daily Armandina Stammer I, NP   50 mg at 12/07/21 0808   traZODone (DESYREL) tablet 50 mg  50 mg Oral QHS PRN White, Patrice L, NP   50 mg at 12/06/21 2341   Lab Results:  Results for orders placed or performed during the hospital encounter of 12/04/21 (from the past 48 hour(s))  CBC with Differential/Platelet     Status: Abnormal   Collection Time: 12/06/21  6:16 PM  Result Value Ref Range   WBC 4.6 4.0 - 10.5 K/uL   RBC 5.19 4.22 - 5.81 MIL/uL   Hemoglobin 17.0 13.0 - 17.0 g/dL   HCT 21.1 (H) 94.1 - 74.0 %   MCV 101.3 (H) 80.0 - 100.0 fL   MCH 32.8 26.0 - 34.0 pg   MCHC 32.3 30.0 - 36.0 g/dL   RDW 81.4 48.1 - 85.6 %   Platelets 196 150 - 400 K/uL   nRBC 0.0 0.0 - 0.2 %   Neutrophils Relative % 31 %   Neutro Abs 1.4 (L) 1.7 - 7.7 K/uL   Lymphocytes Relative 51 %   Lymphs Abs 2.3 0.7 - 4.0 K/uL   Monocytes Relative 11 %   Monocytes Absolute 0.5 0.1 - 1.0 K/uL   Eosinophils Relative 7 %   Eosinophils Absolute 0.3 0.0 - 0.5 K/uL   Basophils Relative 0 %   Basophils Absolute 0.0 0.0 - 0.1 K/uL   Immature Granulocytes 0 %   Abs Immature Granulocytes 0.02 0.00 - 0.07 K/uL    Comment: Performed at [facility], [address], [place], [place] [address]   Blood Alcohol level:  Lab Results  Component Value Date   [facility] <10 12/04/2021   ETH <10 03/30/2021   Metabolic Disorder Labs: Lab Results  Component Value Date   HGBA1C 5.8 (H) 04/02/2021   MPG 119.76 04/02/2021   No results found for:  "PROLACTIN" Lab Results  Component Value Date   CHOL 190 04/02/2021   TRIG 66 04/02/2021   HDL 78 04/02/2021   CHOLHDL 2.4 04/02/2021   VLDL 13 04/02/2021   LDLCALC 99 04/02/2021   Physical Findings: AIMS: Facial and Oral Movements Muscles of Facial Expression: None, normal Lips and Perioral Area: None, normal Jaw:  None, normal Tongue: None, normal,Extremity Movements Upper (arms, wrists, hands, fingers): None, normal Lower (legs, knees, ankles, toes): None, normal, Trunk Movements Neck, shoulders, hips: None, normal, Overall Severity Severity of abnormal movements (highest score from questions above): None, normal Incapacitation due to abnormal movements: None, normal Patient's awareness of abnormal movements (rate only patient's report): No Awareness, Dental Status Current problems with teeth and/or dentures?: No Does patient usually wear dentures?: No  CIWA:    COWS:     Musculoskeletal: Strength & Muscle Tone: within normal limits Gait & Station: normal Patient leans: N/A  Psychiatric Specialty Exam:  Presentation  General Appearance: Appropriate for Environment; Casual; Fairly Groomed  Eye Contact:Good  Speech:Clear and Coherent; Normal Rate  Speech Volume:Normal  Handedness:Right  Mood and Affect  Mood:-- ("Improving")  Affect:Appropriate; Congruent  Thought Process  Thought Processes:Coherent; Goal Directed  Descriptions of Associations:Intact  Orientation:Full (Time, Place and Person)  Thought Content:Logical  History of Schizophrenia/Schizoaffective disorder:No  Duration of Psychotic Symptoms:No data recorded Hallucinations:Hallucinations: None   Ideas of Reference:None  Suicidal Thoughts:Suicidal Thoughts: No SI Passive Intent and/or Plan: Without Intent; Without Plan; Without Means to Carry Out; Without Access to Means   Homicidal Thoughts:Homicidal Thoughts: No   Sensorium  Memory:Immediate Good; Recent Good; Remote  Good  Judgment:Good  Insight:Good  Executive Functions  Concentration:Good  Attention Span:Good  Recall:Good  Fund of Knowledge:Good  Language:Good  Psychomotor Activity  Psychomotor Activity:Psychomotor Activity: Normal   Assets  Assets:Communication Skills; Desire for Improvement; Housing; Health and safety inspector; Physical Health; Resilience; Social Support  Sleep  Sleep:Sleep: Good Number of Hours of Sleep: 6   Physical Exam: Physical Exam Vitals and nursing note reviewed.  HENT:     Nose: Nose normal.     Mouth/Throat:     Pharynx: Oropharynx is clear.  Cardiovascular:     Rate and Rhythm: Normal rate.     Pulses: Normal pulses.  Pulmonary:     Effort: Pulmonary effort is normal.  Genitourinary:    Comments: Deferred Musculoskeletal:        General: Normal range of motion.     Cervical back: Normal range of motion.  Skin:    General: Skin is warm.  Neurological:     General: No focal deficit present.     Mental Status: He is alert and oriented to person, place, and time.    Review of Systems  Constitutional:  Negative for chills, diaphoresis and fever.  HENT:  Negative for congestion and sore throat.   Respiratory:  Negative for cough, shortness of breath and wheezing.   Cardiovascular:  Negative for chest pain and palpitations.  Gastrointestinal:  Negative for abdominal pain, constipation, diarrhea, heartburn, nausea and vomiting.  Neurological:  Negative for dizziness, tingling, tremors, sensory change, speech change, focal weakness, seizures, loss of consciousness, weakness and headaches.  Endo/Heme/Allergies:        Allergies: NKDA  Psychiatric/Behavioral:  Positive for depression and substance abuse (Hx cocaine use disorder.). Negative for hallucinations, memory loss and suicidal ideas. The patient is not nervous/anxious and does not have insomnia.    Blood pressure (!) 87/64, pulse (!) 112, temperature 98.5 F (36.9 C), temperature  source Oral, resp. rate 20, height 5\' 11"  (1.803 m), weight 65 kg, SpO2 96 %. Body mass index is 19.97 kg/m.  Treatment Plan Summary: Daily contact with patient to assess and evaluate symptoms and progress in treatment and Medication management.   Continue inpatient hospitalization.  Will continue today 12/07/2021 plan as below except where it is noted.  Diagnoses:  Major depressive disorder, recurrent episodes. Stimulant (cocaine) use disorder, chronic.   Plan. - Continue Sertraline 50 mg po daily for depression. - Continue Hydroxyzine 25 mg po tid prn for anxiety.  - Continue Trazodone 50 mg po Q hs prn for insomnia.   Other medical complaints. - Continue Mucines 600 mg po bid x 5 days for cough/congestion. - Continue Albuterol inhaler 1 puff Q 6 hrs prn for SOB.   Other prn medications.  - acetaminophen 650 mg po Q 6 hrs prn for pain/fever. - Mylanta 30 ml po Q 6 hours prn for indigestion.  - MOM 30 ml po q daily prn for constipation.    Discharge Planning: Social work and case management to assist with discharge planning and identification of hospital follow-up needs prior to discharge Estimated LOS: 5-7 days Discharge Concerns: Need to establish a safety plan; Medication compliance and effectiveness Discharge Goals: Return home with outpatient referrals for mental health follow-up including medication management/psychotherapy   Armandina Stammer, NP, pmhnp, fnp-bc 12/07/2021, 11:52 AMPatient ID: Richard Moon, male   DOB: 1960/06/29, 61 y.o.   MRN: [id number]

## 2021-12-07 NOTE — [facility] Group Notes (Signed)
[facility] Group Notes:  (Nursing/MHT/Case Management/Adjunct)  Date:  12/07/2021  Time:  9:59 AM  Type of Therapy:  Group Therapy   Participation Level:  Did Not Attend   Summary of Progress/Problems:   Patient did not attend orientation/ goals group today.   Richard Moon 12/07/2021, 9:59 AM 

## 2021-12-07 NOTE — [facility] Group Notes (Signed)
Adult Psychoeducational Group Not Date:  12/07/2021 Time:  0900-1045 Group Topic/Focus: PROGRESSIVE RELAXATION. A group where deep breathing is taught and tensing and relaxation muscle groups is used. Imagery is used as well.  Pts are asked to imagine 3 pillars that hold them up when they are not able to hold themselves up and to share that with the group.  Participation Level: did not attend  Chelbie Jarnagin A   

## 2021-12-07 NOTE — Group Note (Signed)
[facility] Group Notes: (Clinical Social Work)   12/07/2021      Type of Therapy:  Group Therapy   Participation Level:  Did Not Attend - was invited both individually by MHT and by overhead announcement, chose not to attend.   Loie Jahr Grossman-Orr, LCSW 12/07/2021, 11:10 AM    

## 2021-12-07 NOTE — [facility] Group Notes (Signed)
.  Psychoeducational Group Note    Date:  6/17//23 Time: 1300-1400    Purpose of Group: . The group focus' on teaching patients on how to identify their needs and their Life Skills:  A group where two lists are made. What people need and what are things that we do that are unhealthy. The lists are developed by the patients and it is explained that we often do the actions that are not healthy to get our list of needs met.  Goal:: to develop the coping skills needed to get their needs met  Participation Level:  Active  Participation Quality:  Appropriate  Affect:  Appropriate  Cognitive:  Oriented  Insight:  Improving  Engagement in Group:  Engaged  Additional Comments: Rates his energy as a 5/10. Participated in the group.  Richard Moon

## 2021-12-07 NOTE — Progress Notes (Signed)
[facility] Group Notes:  (Nursing/MHT/Case Management/Adjunct)  Date:  12/07/2021  Time:  2015  Type of Therapy:   wrap up group  Participation Level:  Active  Participation Quality:  Appropriate, Attentive, Sharing, and Supportive  Affect:  Depressed  Cognitive:  Alert  Insight:  Improving  Engagement in Group:  Engaged  Modes of Intervention:  Clarification, Education, and Socialization  Summary of Progress/Problems: Positive thinking and self-care were discussed.   Marcille Buffy 12/07/2021, 9:26 PM

## 2021-12-07 NOTE — Progress Notes (Signed)
D. Pt continues to present as depressed, and somewhat guarded. Pt reported that he felt better physically this morning but remained tired. Pt remained isolative to his room for the majority of the shift- did not attend groups despite encouragement from staff. Pt has been eating, and has been encouraged to push fluids.  Pt currently denies SI/HI and AVH A. Labs and vitals monitored. Pt given and educated on medications. Pt supported emotionally and encouraged to express concerns and ask questions.   R. Pt remains safe with 15 minute checks. Will continue POC.

## 2021-12-08 MED ORDER — HYDROXYZINE HCL 25 MG PO TABS
25.0000 mg | ORAL_TABLET | Freq: Three times a day (TID) | ORAL | 0 refills | Status: DC | PRN
Start: 2021-12-08 — End: 2022-05-28

## 2021-12-08 MED ORDER — SERTRALINE HCL 50 MG PO TABS
50.0000 mg | ORAL_TABLET | Freq: Every day | ORAL | 0 refills | Status: DC
Start: 2021-12-08 — End: 2022-05-28

## 2021-12-08 MED ORDER — TRAZODONE HCL 50 MG PO TABS
50.0000 mg | ORAL_TABLET | Freq: Every evening | ORAL | 0 refills | Status: DC | PRN
Start: 2021-12-08 — End: 2022-05-28

## 2021-12-08 NOTE — Progress Notes (Addendum)
D:  Patient denied SI and HI, contracts for safety.  Denied A/V hallucinations. A:  Medications administered per MD orders.  Emotional support and encouragement given patient. R:  Safety maintained with 15 minute checks.  Patient slept 6 hours last night  Patient self inventory form, patient has fair sleep, sleep medication helpful.  Fair appetite, normal energy level, good concentration.  Rated depression and anxiety 5, hopeless 3.  Denied withdrawals.  Denied SI.  Denied physical problems.  Denied physical pain.  Goal is take meds.  Plans to take pills appropriately.  Does have discharge plans. Thank you for your help.

## 2021-12-08 NOTE — [facility] Suicide Risk Assessment (Signed)
Suicide Risk Assessment  Discharge Assessment    [facility] Discharge Suicide Risk Assessment   Principal Problem: MDD (major depressive disorder) Discharge Diagnoses: Principal Problem:   MDD (major depressive disorder)  Reason For Admission: This is an admission evaluation for this 61 year old AA male with prior hx of major depressive disorder & stimulant (cocaine) use disorder. He is known in this [facility] & the [facility] for complain of worsening symptoms of depression, drug use & or suicidal ideations. Chart review reports indicated that patient has been seen at the other psychiatric hospitals within the surrounding areas with similar complaints. He is admitted to the [facility] this time around with complaint of suicidal ideations with plan to walk into traffic. His UDS was positive for cocaine. After medical evaluation/stabilization & clearance, he was transferred to the [facility] for further psychiatric evaluation & treatments.                                        HOSPITAL COURSE During the patient's hospitalization, patient had extensive initial psychiatric evaluation, and follow-up psychiatric evaluations every day.  Psychiatric diagnoses provided upon initial assessment were as follows: Major depressive disorder, recurrent episodes. Stimulant (cocaine) use disorder, chronic.  Patient's psychiatric medications were adjusted on admission as follows:  -Started Sertraline 50 mg po daily for depression. -  Started Hydroxyzine 25 mg po tid prn for anxiety.  - Continued Trazodone 50 mg po Q hs prn for insomnia.   During the hospitalization, other adjustments were made to the patient's psychiatric medication regimen, with medications at discharge being as follows:  Continue Sertraline 50 mg po daily for depression. - Continue Hydroxyzine 25 mg po tid prn for anxiety.  - Continue Trazodone 50 mg po Q hs prn for insomnia.  Patient's care was discussed during the interdisciplinary team meeting every day during the  hospitalization. The patient denies having side effects to prescribed psychiatric medication.  The patient was evaluated each day by a clinical provider to ascertain response to treatment. Improvement was noted by the patient's report of decreasing symptoms, improved sleep and appetite, affect, medication tolerance, behavior, and participation in unit programming.  Patient was asked each day to complete a self inventory noting mood, mental status, pain, new symptoms, anxiety and concerns.    Symptoms were reported as significantly decreased or resolved completely by discharge. On day of discharge, the patient reports that their mood is stable. The patient denied having suicidal thoughts for more than 48 hours prior to discharge.  Patient denies having homicidal thoughts.  Patient denies having auditory hallucinations.  Patient denies any visual hallucinations or other symptoms of psychosis. The patient was motivated to continue taking medication with a goal of continued improvement in mental health.   The patient reports their target psychiatric symptoms of depression, anxiety and insomnia responded well to the psychiatric medications, and the patient reports overall benefit other psychiatric hospitalization. Supportive psychotherapy was provided to the patient. The patient also participated in regular group therapy while hospitalized. Coping skills, problem solving as well as relaxation therapies were also part of the unit programming.  Labs were reviewed with the patient, and abnormal results were discussed with the patient. CR is slightly elevated during this admission at 1.47 & pt has been educated on the need to follow this up with his Primary care provider and has verbalized understanding.  Total Time spent with patient: 30 minutes  Musculoskeletal: Strength & Muscle Tone: within normal limits Gait & Station: normal Patient leans: N/A  Psychiatric Specialty Exam  Presentation  General  Appearance: Appropriate for Environment; Fairly Groomed  Eye Contact:Good  Speech:Clear and Coherent  Speech Volume:Normal  Handedness:Right  Mood and Affect  Mood:Euthymic  Duration of Depression Symptoms: Less than two weeks  Affect:Appropriate; Congruent  Thought Process  Thought Processes:Coherent  Descriptions of Associations:Intact  Orientation:Full (Time, Place and Person)  Thought Content:Logical  History of Schizophrenia/Schizoaffective disorder:No  Duration of Psychotic Symptoms:No data recorded Hallucinations:Hallucinations: None  Ideas of Reference:None  Suicidal Thoughts:Suicidal Thoughts: No SI Passive Intent and/or Plan: Without Intent; Without Plan; Without Means to Carry Out; Without Access to Means  Homicidal Thoughts:Homicidal Thoughts: No  Sensorium  Memory:Immediate Good  Judgment:Good  Insight:Good  Executive Functions  Concentration:Good  Attention Span:Good  Recall:Good  Fund of Knowledge:Good  Language:Good  Psychomotor Activity  Psychomotor Activity:Psychomotor Activity: Normal  Assets  Assets:Communication Skills  Sleep  Sleep:Sleep: Good Number of Hours of Sleep: 6  Physical Exam: Physical Exam Constitutional:      Appearance: Normal appearance.  HENT:     Head: Normocephalic.     Nose: Nose normal. No congestion or rhinorrhea.  Eyes:     Pupils: Pupils are equal, round, and reactive to light.  Pulmonary:     Effort: Pulmonary effort is normal.  Musculoskeletal:        General: Normal range of motion.     Cervical back: Normal range of motion.  Neurological:     Mental Status: He is alert and oriented to person, place, and time.     Sensory: No sensory deficit.     Coordination: Coordination normal.  Psychiatric:        Behavior: Behavior normal.        Thought Content: Thought content normal.    Review of Systems  Constitutional: Negative.   HENT: Negative.    Eyes: Negative.   Respiratory:  Negative.    Cardiovascular: Negative.   Gastrointestinal: Negative.   Genitourinary: Negative.   Musculoskeletal: Negative.   Skin: Negative.   Neurological: Negative.   Endo/Heme/Allergies: Negative.   Psychiatric/Behavioral:  Positive for depression (Pt reports that depressive symptoms are resolving and he currently denies SI/HI/AVH and verbally contracts for safety outside of this [facility]) and substance abuse (Educated on the need to abstain from substances of abuse and verbalizes understanding). Negative for hallucinations, memory loss and suicidal ideas. The patient is not nervous/anxious and does not have insomnia.    Blood pressure 129/66, pulse 73, temperature 97.8 F (36.6 C), temperature source Oral, resp. rate 18, height 5\' 11"  (1.803 m), weight 65 kg, SpO2 100 %. Body mass index is 19.97 kg/m.  Mental Status Per Nursing Assessment::   On Admission:  Suicidal ideation indicated by patient  Demographic Factors:  Male  Loss Factors: NA  Historical Factors: NA  Risk Reduction Factors:   Employed  Continued Clinical Symptoms:  Patient reports that his depressive symptoms are resolving and he currently denies SI/HI/AVH and verbally contracts for safety outside of this [facility].  Cognitive Features That Contribute To Risk:  None    Suicide Risk:  Minimal: No identifiable suicidal ideation.  Patients presenting with no risk factors but with morbid ruminations; may be classified as minimal risk based on the severity of the depressive symptoms   Follow-up Information     [facility] Go on 12/10/2021.   Specialty: Behavioral Health Why: You have an appointment for the  substance abuse intensive outpatient program with this provider on 12/10/21 at 12:00 pm.   The appointment will be held in person.  Medication management services are also available.  Please bring your insurance card with you. Contact information: [address] [place]  [address] [phone]                Plan Of Care/Follow-up recommendations:  The patient is able to verbalize their individual safety plan to this provider.  # It is recommended to the patient to continue psychiatric medications as prescribed, after discharge from the hospital.    # It is recommended to the patient to follow up with your outpatient psychiatric provider and PCP.  # It was discussed with the patient, the impact of alcohol, drugs, tobacco have been there overall psychiatric and medical wellbeing, and total abstinence from substance use was recommended the patient.ed.  # Prescriptions provided or sent directly to preferred pharmacy at discharge. Patient agreeable to plan. Given opportunity to ask questions. Appears to feel comfortable with discharge.    # In the event of worsening symptoms, the patient is instructed to call the crisis hotline (988), 911 and or go to the nearest ED for appropriate evaluation and treatment of symptoms. To follow-up with primary care provider for other medical issues, concerns and or health care needs  # Patient was discharged home with a plan to follow up as noted above.   Starleen Blue, NP 12/08/2021, 12:56 PM

## 2021-12-08 NOTE — Discharge Summary (Signed)
Physician Discharge Summary Note  Patient:  Richard Moon is an 61 y.o., male MRN:  [id number] DOB:  25-Jun-1960 Patient phone:  [phone] (home)  Patient address:   [address] [place] [address],  Total Time spent with patient: 30 minutes  Date of Admission:  12/04/2021 Date of Discharge: 12/08/2021  Reason for Admission:  This is an admission evaluation for this 62 year old AA male with prior hx of major depressive disorder & stimulant (cocaine) use disorder. He is known in this [facility] & the [facility] for complain of worsening symptoms of depression, drug use & or suicidal ideations. Chart review reports indicated that patient has been seen at the other psychiatric hospitals within the surrounding areas with similar complaints. He is admitted to the [facility] this time around with complaint of suicidal ideations with plan to walk into traffic. His UDS was positive for cocaine. After medical evaluation/stabilization & clearance, he was transferred to the [facility] for further psychiatric evaluation & treatments.  Principal Problem: MDD (major depressive disorder) Discharge Diagnoses: Principal Problem:   MDD (major depressive disorder)  Past Psychiatric History: As above  Past Medical History: History reviewed. No pertinent past medical history.  Past Surgical History:  Procedure Laterality Date   HERNIA REPAIR     Family History: History reviewed. No pertinent family history. Family Psychiatric  History: none reported Social History:  Social History   Substance and Sexual Activity  Alcohol Use Yes   Comment: Occasional alcohol use     Social History   Substance and Sexual Activity  Drug Use Yes   Types: Marijuana, Cocaine, Heroin   Comment: daily cocaine use (reported $200-300/ daily); occasional cannabis use    Social History   Socioeconomic History   Marital status: Single    Spouse name: Not on file   Number of children: Not on file   Years of education: Not on file    Highest education level: Not on file  Occupational History   Not on file  Tobacco Use   Smoking status: Some Days    Packs/day: 0.25    Years: 20.00    Total pack years: 5.00    Types: Cigarettes   Smokeless tobacco: Never  Vaping Use   Vaping Use: Every day  Substance and Sexual Activity   Alcohol use: Yes    Comment: Occasional alcohol use   Drug use: Yes    Types: Marijuana, Cocaine, Heroin    Comment: daily cocaine use (reported $200-300/ daily); occasional cannabis use   Sexual activity: Yes    Birth control/protection: Condom  Other Topics Concern   Not on file  Social History Narrative   Not on file   Social Determinants of Health   Financial Resource Strain: Not on file  Food Insecurity: Not on file  Transportation Needs: Not on file  Physical Activity: Not on file  Stress: Not on file  Social Connections: Not on file                                          HOSPITAL COURSE  During the patient's hospitalization, patient had extensive initial psychiatric evaluation, and follow-up psychiatric evaluations every day.   Psychiatric diagnoses provided upon initial assessment were as follows: Major depressive disorder, recurrent episodes. Stimulant (cocaine) use disorder, chronic.   Patient's psychiatric medications were adjusted on admission as follows:  -Started Sertraline 50 mg  po daily for depression. -  Started Hydroxyzine 25 mg po tid prn for anxiety.  - Continued Trazodone 50 mg po Q hs prn for insomnia.   During the hospitalization, other adjustments were made to the patient's psychiatric medication regimen, with medications at discharge being as follows:  Continue Sertraline 50 mg po daily for depression. - Continue Hydroxyzine 25 mg po tid prn for anxiety.  - Continue Trazodone 50 mg po Q hs prn for insomnia.   Patient's care was discussed during the interdisciplinary team meeting every day during the hospitalization. The patient denies having side  effects to prescribed psychiatric medication.  The patient was evaluated each day by a clinical provider to ascertain response to treatment. Improvement was noted by the patient's report of decreasing symptoms, improved sleep and appetite, affect, medication tolerance, behavior, and participation in unit programming.  Patient was asked each day to complete a self inventory noting mood, mental status, pain, new symptoms, anxiety and concerns.     Symptoms were reported as significantly decreased or resolved completely by discharge. On day of discharge, the patient reports that their mood is stable. The patient denied having suicidal thoughts for more than 48 hours prior to discharge.  Patient denies having homicidal thoughts.  Patient denies having auditory hallucinations.  Patient denies any visual hallucinations or other symptoms of psychosis. The patient was motivated to continue taking medication with a goal of continued improvement in mental health.    The patient reports their target psychiatric symptoms of depression, anxiety and insomnia responded well to the psychiatric medications, and the patient reports overall benefit other psychiatric hospitalization. Supportive psychotherapy was provided to the patient. The patient also participated in regular group therapy while hospitalized. Coping skills, problem solving as well as relaxation therapies were also part of the unit programming.   Labs were reviewed with the patient, and abnormal results were discussed with the patient. CR is slightly elevated during this admission at 1.47 & pt has been educated on the need to follow this up with his Primary care provider and has verbalized understanding.    Physical Findings: AIMS: Facial and Oral Movements Muscles of Facial Expression: None, normal Lips and Perioral Area: None, normal Jaw: None, normal Tongue: None, normal,Extremity Movements Upper (arms, wrists, hands, fingers): None, normal Lower  (legs, knees, ankles, toes): None, normal, Trunk Movements Neck, shoulders, hips: None, normal, Overall Severity Severity of abnormal movements (highest score from questions above): None, normal Incapacitation due to abnormal movements: None, normal Patient's awareness of abnormal movements (rate only patient's report): No Awareness, Dental Status Current problems with teeth and/or dentures?: No Does patient usually wear dentures?: No  CIWA:  n/a  COWS:   n/a  Musculoskeletal: Strength & Muscle Tone: within normal limits Gait & Station: normal Patient leans: N/A   Psychiatric Specialty Exam:  Presentation  General Appearance: Appropriate for Environment; Fairly Groomed  Eye Contact:Good  Speech:Clear and Coherent  Speech Volume:Normal  Handedness:Right   Mood and Affect  Mood:Euthymic  Affect:Appropriate; Congruent   Thought Process  Thought Processes:Coherent  Descriptions of Associations:Intact  Orientation:Full (Time, Place and Person)  Thought Content:Logical  History of Schizophrenia/Schizoaffective disorder:No  Duration of Psychotic Symptoms:No data recorded Hallucinations:Hallucinations: None  Ideas of Reference:None  Suicidal Thoughts:Suicidal Thoughts: No SI Passive Intent and/or Plan: Without Intent; Without Plan; Without Means to Carry Out; Without Access to Means  Homicidal Thoughts:Homicidal Thoughts: No  Sensorium  Memory:Immediate Good  Judgment:Good  Insight:Good  Executive Functions  Concentration:Good  Attention Span:Good  Recall:Good  Fund of Knowledge:Good  Language:Good  Psychomotor Activity  Psychomotor Activity:Psychomotor Activity: Normal  Assets  Assets:Communication Skills  Sleep  Sleep:Sleep: Good Number of Hours of Sleep: 6  Physical Exam: Physical Exam Constitutional:      Appearance: Normal appearance.  HENT:     Head: Normocephalic.     Nose: Nose normal. No congestion or rhinorrhea.  Eyes:      Pupils: Pupils are equal, round, and reactive to light.  Musculoskeletal:        General: Normal range of motion.     Cervical back: Normal range of motion.  Neurological:     Mental Status: He is alert and oriented to person, place, and time.     Sensory: No sensory deficit.  Psychiatric:        Behavior: Behavior normal.        Thought Content: Thought content normal.    Review of Systems  Constitutional: Negative.  Negative for fever.  HENT: Negative.    Respiratory: Negative.    Cardiovascular: Negative.   Gastrointestinal: Negative.   Genitourinary: Negative.   Musculoskeletal: Negative.   Skin: Negative.   Neurological: Negative.   Psychiatric/Behavioral:  Positive for depression (Pt reports that his depressive symptoms are resolving on current medications. He denies SI/HI/AVH and verbally contracts for safety outside of this [facility]) and substance abuse. Negative for hallucinations, memory loss and suicidal ideas. The patient is not nervous/anxious and does not have insomnia.    Blood pressure 129/66, pulse 73, temperature 97.8 F (36.6 C), temperature source Oral, resp. rate 18, height 5\' 11"  (1.803 m), weight 65 kg, SpO2 100 %. Body mass index is 19.97 kg/m.   Social History   Tobacco Use  Smoking Status Some Days   Packs/day: 0.25   Years: 20.00   Total pack years: 5.00   Types: Cigarettes  Smokeless Tobacco Never   Tobacco Cessation:  N/A, patient does not currently use tobacco products   Blood Alcohol level:  Lab Results  Component Value Date   ETH <10 12/04/2021   ETH <10 03/30/2021    Metabolic Disorder Labs:  Lab Results  Component Value Date   HGBA1C 5.8 (H) 04/02/2021   MPG 119.76 04/02/2021   No results found for: "PROLACTIN" Lab Results  Component Value Date   CHOL 190 04/02/2021   TRIG 66 04/02/2021   HDL 78 04/02/2021   CHOLHDL 2.4 04/02/2021   VLDL 13 04/02/2021   LDLCALC 99 04/02/2021    See Psychiatric Specialty Exam and  Suicide Risk Assessment completed by Attending Physician prior to discharge.  Discharge destination:  Home  Is patient on multiple antipsychotic therapies at discharge:  No   Has Patient had three or more failed trials of antipsychotic monotherapy by history:  No  Recommended Plan for Multiple Antipsychotic Therapies: NA  Discharge Instructions     [facility] pharmacy consult for medication samples   Complete by: As directed    Pharmacist, please provide pt with 7 days worth of his medication samples. He states he has [employer], which just went bankrupt.      Allergies as of 12/08/2021   No Known Allergies      Medication List     TAKE these medications      Indication  hydrOXYzine 25 MG tablet Commonly known as: ATARAX Take 1 tablet (25 mg total) by mouth 3 (three) times daily as needed for anxiety (Sleep).  Indication: Feeling Anxious   sertraline 50 MG tablet  Commonly known as: ZOLOFT Take 1 tablet (50 mg total) by mouth daily.  Indication: Major Depressive Disorder   traZODone 50 MG tablet Commonly known as: DESYREL Take 1 tablet (50 mg total) by mouth at bedtime as needed for sleep. What changed:  medication strength how much to take  Indication: Anxiety Disorder        Follow-up Information     [facility]. Go on 12/10/2021.   Specialty: Behavioral Health Why: You have an appointment for the substance abuse intensive outpatient program with this provider on 12/10/21 at 12:00 pm.   The appointment will be held in person.  Medication management services are also available.  Please bring your insurance card with you. Contact information: [address] [place] [address] [phone]                Follow-up recommendations:   The patient is able to verbalize their individual safety plan to this provider.   # It is recommended to the patient to continue psychiatric medications as prescribed, after discharge from the  hospital.     # It is recommended to the patient to follow up with your outpatient psychiatric provider and PCP.   # It was discussed with the patient, the impact of alcohol, drugs, tobacco have been there overall psychiatric and medical wellbeing, and total abstinence from substance use was recommended the patient.ed.   # Prescriptions provided or sent directly to preferred pharmacy at discharge. Patient agreeable to plan. Given opportunity to ask questions. Appears to feel comfortable with discharge.    # In the event of worsening symptoms, the patient is instructed to call the crisis hotline (988), 911 and or go to the nearest ED for appropriate evaluation and treatment of symptoms. To follow-up with primary care provider for other medical issues, concerns and or health care needs   # Patient was discharged home with a plan to follow up as noted above.    Signed: Starleen Blue, NP 12/08/2021, 2:35 PM

## 2021-12-08 NOTE — Group Note (Signed)
Recreation Therapy Group Note   Group Topic:Problem Solving  Group Date: 12/08/2021 Start Time: 0930 End Time: 1000 Facilitators: Caroll Rancher, LRT,CTRS Location: [address] Dayroom   Goal Area(s) Addresses:  Patient will effectively work with peer towards shared goal.  Patient will identify skills used to make activity successful.  Patient will share challenges and verbalize solution-driven approaches used. Patient will identify how skills used during activity can be used to reach post d/c goals.   Group Description:  [employer]. Patients were provided the following materials: 2 drinking straws, 5 rubber bands, 5 paper clips, 2 index cards and 2 drinking cups. Using the provided materials patients were asked to build a launching mechanism to launch a ping pong ball across the room, approximately 10 feet. Patients were divided into teams of 3-5. Instructions required all materials be incorporated into the device, functionality of items left to the peer group's discretion.   Affect/Mood: N/A   Participation Level: Did not attend    Clinical Observations/Individualized Feedback:     Plan: Continue to engage patient in RT group sessions 2-3x/week.   Caroll Rancher, LRT,CTRS 12/08/2021 1:18 PM

## 2021-12-08 NOTE — BHH Group Notes (Signed)
Spiritual care group on grief and loss facilitated by chaplain Dyanne Carrel, [facility]   Group Goal:   Support / Education around grief and loss   Members engage in facilitated group support and psycho-social education.   Group Description:   Following introductions and group rules, group members engaged in facilitated group dialog and support around topic of loss, with particular support around experiences of loss in their lives. Group Identified types of loss (relationships / self / things) and identified patterns, circumstances, and changes that precipitate losses. Reflected on thoughts / feelings around loss, normalized grief responses, and recognized variety in grief experience. Group noted Worden's four tasks of grief in discussion.   Group drew on Adlerian / Rogerian, narrative, MI,   Patient Progress: Richard Moon attended group.  He did not verbally participate, but showed some engagement in the group conversation.  [address] Bcc Pager, [phone]

## 2021-12-08 NOTE — Progress Notes (Signed)
  [facility] Adult Case Management Discharge Plan :  Will you be returning to the same living situation after discharge:  Yes,  Patient to return to place of residence.  At discharge, do you have transportation home?: Yes,  Patient to return  home via bus.  Patient has expressed they are confident in navigating bus system.   Do you have the ability to pay for your medications: Yes,  [employer]  Release of information consent forms completed and in the chart;  Patient's signature needed at discharge.  Patient to Follow up at:  Follow-up Information     [facility]. Go on 12/10/2021.   Specialty: Behavioral Health Why: You have an appointment for the substance abuse intensive outpatient program with this provider on 12/10/21 at 12:00 pm.   The appointment will be held in person.  Medication management services are also available.  Please bring your insurance card with you. Contact information: [address] [place] [address] [phone]                 Next level of care provider has access to [facility] Link:no  Safety Planning and Suicide Prevention discussed: Yes,  SPE completed with patient, declined consent multiple times for CSW to reach collateral.  CSW completed SPE with patient. Discussed potential triggers leading to suicidal ideation in addition to coping skills one might use in order to delay and distract self from self harming behaviors. CSW encouraged patient to utilize emergency services if they felt unable to maintain their safety. SPE flyer provided to patient at this time.    Has patient been referred to the Quitline?: Patient refused referral Tobacco Use: High Risk (12/05/2021)   Patient History    Smoking Tobacco Use: Some Days    Smokeless Tobacco Use: Never    Passive Exposure: Not on file    Patient has been referred for addiction treatment: Yes Patient referred for outpatient chemical dependency intensive outpatient services. Appointment  information listed above.  Alcohol Level    Component Value Date/Time   [facility] <10 12/04/2021 0802   Social History   Substance and Sexual Activity  Alcohol Use Yes   Comment: Occasional alcohol use   Social History   Substance and Sexual Activity  Drug Use Yes   Types: Marijuana, Cocaine, Heroin   Comment: daily cocaine use (reported $200-300/ daily); occasional cannabis use    Corky Crafts, LCSWA 12/08/2021, 10:59 AM

## 2021-12-08 NOTE — Progress Notes (Signed)
Discharge Note:  Patient discharged home.  Suicide prevention information given and discussed with patient who stated he understood and had no questions.  Patient denied SI and HI.  Denied A/V hallucinations.  Patient stated he received all his belongings.  Patient stated he appreciated all assistance received from [facility] staff.

## 2022-05-26 ENCOUNTER — Emergency Department (HOSPITAL_COMMUNITY): Payer: Commercial Managed Care - HMO

## 2022-05-26 ENCOUNTER — Inpatient Hospital Stay (HOSPITAL_COMMUNITY)
Admission: EM | Admit: 2022-05-26 | Discharge: 2022-05-28 | DRG: 193 | Disposition: A | Discharge status: Home / Self Care | Payer: Commercial Managed Care - HMO | Attending: Internal Medicine | Admitting: Internal Medicine

## 2022-05-26 ENCOUNTER — Encounter (HOSPITAL_COMMUNITY): Payer: Self-pay

## 2022-05-26 DIAGNOSIS — J101 Influenza due to other identified influenza virus with other respiratory manifestations: Secondary | ICD-10-CM | POA: Diagnosis not present

## 2022-05-26 DIAGNOSIS — F322 Major depressive disorder, single episode, severe without psychotic features: Secondary | ICD-10-CM | POA: Diagnosis present

## 2022-05-26 DIAGNOSIS — Z720 Tobacco use: Secondary | ICD-10-CM | POA: Diagnosis not present

## 2022-05-26 DIAGNOSIS — R0902 Hypoxemia: Secondary | ICD-10-CM

## 2022-05-26 DIAGNOSIS — Z20822 Contact with and (suspected) exposure to covid-19: Secondary | ICD-10-CM | POA: Diagnosis present

## 2022-05-26 DIAGNOSIS — N179 Acute kidney failure, unspecified: Secondary | ICD-10-CM | POA: Diagnosis present

## 2022-05-26 DIAGNOSIS — F17210 Nicotine dependence, cigarettes, uncomplicated: Secondary | ICD-10-CM | POA: Diagnosis present

## 2022-05-26 DIAGNOSIS — J9601 Acute respiratory failure with hypoxia: Secondary | ICD-10-CM | POA: Diagnosis present

## 2022-05-26 DIAGNOSIS — F1424 Cocaine dependence with cocaine-induced mood disorder: Secondary | ICD-10-CM | POA: Diagnosis present

## 2022-05-26 DIAGNOSIS — J9621 Acute and chronic respiratory failure with hypoxia: Secondary | ICD-10-CM | POA: Diagnosis not present

## 2022-05-26 DIAGNOSIS — F1910 Other psychoactive substance abuse, uncomplicated: Secondary | ICD-10-CM | POA: Diagnosis present

## 2022-05-26 DIAGNOSIS — F1494 Cocaine use, unspecified with cocaine-induced mood disorder: Secondary | ICD-10-CM | POA: Diagnosis present

## 2022-05-26 LAB — TROPONIN I (HIGH SENSITIVITY)
Troponin I (High Sensitivity): 7 ng/L (ref <18)
Troponin I (High Sensitivity): 6 ng/L (ref <18)

## 2022-05-26 LAB — CBC WITH DIFFERENTIAL/PLATELET
Abs Immature Granulocytes: 0.02 10*3/uL (ref 0.00–0.07)
Basophils Absolute: 0 10*3/uL (ref 0.0–0.1)
Basophils Relative: 0 %
Eosinophils Absolute: 0 10*3/uL (ref 0.0–0.5)
Eosinophils Relative: 0 %
HCT: 49.1 % (ref 39.0–52.0)
Hemoglobin: 16.5 g/dL (ref 13.0–17.0)
Immature Granulocytes: 0 %
Lymphocytes Relative: 14 %
Lymphs Abs: 1.2 10*3/uL (ref 0.7–4.0)
MCH: 32.7 pg (ref 26.0–34.0)
MCHC: 33.6 g/dL (ref 30.0–36.0)
MCV: 97.2 fL (ref 80.0–100.0)
Monocytes Absolute: 0.9 10*3/uL (ref 0.1–1.0)
Monocytes Relative: 12 %
Neutro Abs: 6 10*3/uL (ref 1.7–7.7)
Neutrophils Relative %: 74 %
Platelets: 188 10*3/uL (ref 150–400)
RBC: 5.05 MIL/uL (ref 4.22–5.81)
RDW: 13.5 % (ref 11.5–15.5)
WBC: 8.1 10*3/uL (ref 4.0–10.5)
nRBC: 0 % (ref 0.0–0.2)

## 2022-05-26 LAB — RESP PANEL BY RT-PCR (FLU A&B, COVID) ARPGX2
Influenza A by PCR: POSITIVE — AB
Influenza B by PCR: NEGATIVE
SARS Coronavirus 2 by RT PCR: NEGATIVE

## 2022-05-26 LAB — CREATININE, SERUM
Creatinine, Ser: 1.6 mg/dL — ABNORMAL HIGH (ref 0.61–1.24)
GFR, Estimated: 49 mL/min — ABNORMAL LOW (ref >60)

## 2022-05-26 LAB — BASIC METABOLIC PANEL
Anion gap: 10 (ref 5–15)
BUN: 30 mg/dL — ABNORMAL HIGH (ref 6–20)
CO2: 24 mmol/L (ref 22–32)
Calcium: 8.9 mg/dL (ref 8.9–10.3)
Chloride: 102 mmol/L (ref 98–111)
Creatinine, Ser: 1.74 mg/dL — ABNORMAL HIGH (ref 0.61–1.24)
GFR, Estimated: 45 mL/min — ABNORMAL LOW (ref >60)
Glucose, Bld: 90 mg/dL (ref 70–99)
Potassium: 4.2 mmol/L (ref 3.5–5.1)
Sodium: 136 mmol/L (ref 135–145)

## 2022-05-26 LAB — CBC
HCT: 48.3 % (ref 39.0–52.0)
Hemoglobin: 15.9 g/dL (ref 13.0–17.0)
MCH: 31.9 pg (ref 26.0–34.0)
MCHC: 32.9 g/dL (ref 30.0–36.0)
MCV: 97 fL (ref 80.0–100.0)
Platelets: 192 10*3/uL (ref 150–400)
RBC: 4.98 MIL/uL (ref 4.22–5.81)
RDW: 13.4 % (ref 11.5–15.5)
WBC: 6.1 10*3/uL (ref 4.0–10.5)
nRBC: 0 % (ref 0.0–0.2)

## 2022-05-26 LAB — BRAIN NATRIURETIC PEPTIDE: B Natriuretic Peptide: 27.5 pg/mL (ref 0.0–100.0)

## 2022-05-26 MED ORDER — SENNOSIDES-DOCUSATE SODIUM 8.6-50 MG PO TABS: 1.0000 | ORAL_TABLET | Freq: Every evening | ORAL | Status: DC | PRN

## 2022-05-26 MED ORDER — ENOXAPARIN SODIUM 40 MG/0.4ML IJ SOSY
40.0000 mg | PREFILLED_SYRINGE | INTRAMUSCULAR | Status: DC
  Filled 2022-05-26: qty 0.4

## 2022-05-26 MED ORDER — IPRATROPIUM-ALBUTEROL 0.5-2.5 (3) MG/3ML IN SOLN
3.0000 mL | Freq: Four times a day (QID) | RESPIRATORY_TRACT | Status: DC
  Administered 2022-05-27 – 2022-05-28 (×3): 3 mL via RESPIRATORY_TRACT
  Filled 2022-05-26 (×5): qty 3

## 2022-05-26 MED ORDER — ACETAMINOPHEN 325 MG PO TABS
650.0000 mg | ORAL_TABLET | Freq: Four times a day (QID) | ORAL | Status: DC | PRN
  Administered 2022-05-27: 650 mg via ORAL
  Filled 2022-05-26: qty 2

## 2022-05-26 MED ORDER — ALBUTEROL SULFATE (2.5 MG/3ML) 0.083% IN NEBU: 2.5000 mg | INHALATION_SOLUTION | RESPIRATORY_TRACT | Status: DC | PRN

## 2022-05-26 MED ORDER — OSELTAMIVIR PHOSPHATE 75 MG PO CAPS
75.0000 mg | ORAL_CAPSULE | Freq: Once | ORAL | Status: AC
  Administered 2022-05-26: 75 mg via ORAL
  Filled 2022-05-26: qty 1

## 2022-05-26 MED ORDER — OSELTAMIVIR PHOSPHATE 75 MG PO CAPS: 75.0000 mg | ORAL_CAPSULE | Freq: Two times a day (BID) | ORAL | Status: DC

## 2022-05-26 MED ORDER — SODIUM CHLORIDE 0.9 % IV BOLUS
1000.0000 mL | Freq: Once | INTRAVENOUS | Status: AC
  Administered 2022-05-26: 1000 mL via INTRAVENOUS

## 2022-05-26 MED ORDER — GUAIFENESIN 100 MG/5ML PO LIQD: 5.0000 mL | ORAL | Status: DC | PRN

## 2022-05-26 MED ORDER — ACETAMINOPHEN 650 MG RE SUPP: 650.0000 mg | Freq: Four times a day (QID) | RECTAL | Status: DC | PRN

## 2022-05-26 MED ORDER — NICOTINE 14 MG/24HR TD PT24: 14.0000 mg | MEDICATED_PATCH | Freq: Every day | TRANSDERMAL | Status: DC

## 2022-05-26 MED ORDER — SODIUM CHLORIDE 0.9 % IV SOLN: INTRAVENOUS | Status: AC

## 2022-05-26 MED ORDER — OSELTAMIVIR PHOSPHATE 30 MG PO CAPS
30.0000 mg | ORAL_CAPSULE | Freq: Two times a day (BID) | ORAL | Status: DC
  Administered 2022-05-27 – 2022-05-28 (×3): 30 mg via ORAL
  Filled 2022-05-26 (×3): qty 1

## 2022-05-26 NOTE — H&P (Signed)
PCP:   Patient, No Pcp Per   Chief Complaint: Shortness of breath   HPI: This is a 61 year old male with significant past medical history.  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  He has been feeling bad for the past 2 days, he states he cannot breathe.  He states to go from the bathroom to the kitchen which is not far and back, he has to sit and rest.  He has to put a chair in between the rooms otherwise, he could not make it.  Per patient he had to put 1 sock on, then lay down, then placed on the socks on and lay down.  He was extremely exhausted and fatigued.  He complains of a dry cough which is mild.  He states he is wheezes a bit and he has some mild chills.  He has not been able to smoke her cigarettes.  He endorses fever.  He came to the ER.  In the ER the patient was influenza A positive.  He is also hypoxic into the 80s.  He is on 2 L oxygen.  In the ER, patient's Tmax is 101.6.  The hospitalist have been asked to admit.  Review of Systems:  The patient denies anorexia, fever, weight loss,, vision loss, decreased hearing, hoarseness, chest pain, syncope, dyspnea on exertion, peripheral edema, balance deficits, hemoptysis, abdominal pain, melena, hematochezia, severe indigestion/heartburn, hematuria, incontinence, genital sores, muscle weakness, suspicious skin lesions, transient blindness, difficulty walking, depression, unusual weight change, abnormal bleeding, enlarged lymph nodes, angioedema, and breast masses. Positives: Shortness of breath, fatigue, fever, wheeze, cough  Past Medical History: History reviewed. No pertinent past medical history. Past Surgical History:  Procedure Laterality Date   HERNIA REPAIR      Medications: Prior to Admission medications   Medication Sig Start Date End Date Taking? Authorizing Provider  hydrOXYzine (ATARAX) 25 MG tablet Take 1 tablet (25 mg total) by mouth 3 (three) times daily as needed for anxiety (Sleep). 12/08/21   Starleen Blue,  NP  sertraline (ZOLOFT) 50 MG tablet Take 1 tablet (50 mg total) by mouth daily. 12/08/21 02/06/22  Starleen Blue, NP  traZODone (DESYREL) 50 MG tablet Take 1 tablet (50 mg total) by mouth at bedtime as needed for sleep. 12/08/21   Starleen Blue, NP    Allergies:  No Known Allergies  Social History:  reports that he has been smoking cigarettes. He has a 5.00 pack-year smoking history. He has never used smokeless tobacco. He reports current alcohol use. He reports current drug use. Drugs: Marijuana, Cocaine, and Heroin.  Family History: History reviewed. No pertinent family history.  Physical Exam: Vitals:   05/26/22 2045 05/26/22 2100 05/26/22 2115 05/26/22 2117  BP: 108/71 115/75 (!) 97/49   Pulse: 93 93 79   Resp: 18 18 20    Temp:   99.4 F (37.4 C)   TempSrc:      SpO2: 99% 99% 97% (S) (!) 85%    General:  Alert and oriented times three, well developed and nourished, no acute distress. Mildly ill appearing Eyes: PERRLA, pink conjunctiva, no scleral icterus ENT: Moist oral mucosa, neck supple, no thyromegaly Lungs: clear to ascultation, no wheeze, no crackles, no use of accessory muscles Cardiovascular: regular rate and rhythm, no regurgitation, no gallops, no murmurs. No carotid bruits, no JVD Abdomen: soft, positive BS, non-tender, non-distended, no organomegaly, not an acute abdomen GU: not examined Neuro: CN II - XII grossly intact, sensation intact Musculoskeletal: strength 5/5 all extremities, no clubbing,  cyanosis or edema Skin: no rash, no subcutaneous crepitation, no decubitus Psych: appropriate patient   Labs on Admission:  Recent Labs    05/26/22 1422  NA 136  K 4.2  CL 102  CO2 24  GLUCOSE 90  BUN 30*  CREATININE 1.74*  CALCIUM 8.9    Recent Labs    05/26/22 1422  WBC 8.1  NEUTROABS 6.0  HGB 16.5  HCT 49.1  MCV 97.2  PLT 188    Micro Results: Recent Results (from the past 240 hour(s))  Resp Panel by RT-PCR (Flu A&B, Covid) Anterior Nasal  Swab     Status: Abnormal   Collection Time: 05/26/22  8:12 PM   Specimen: Anterior Nasal Swab  Result Value Ref Range Status   SARS Coronavirus 2 by RT PCR NEGATIVE NEGATIVE Final    Comment: (NOTE) SARS-CoV-2 target nucleic acids are NOT DETECTED.  The SARS-CoV-2 RNA is generally detectable in upper respiratory specimens during the acute phase of infection. The lowest concentration of SARS-CoV-2 viral copies this assay can detect is 138 copies/mL. A negative result does not preclude SARS-Cov-2 infection and should not be used as the sole basis for treatment or other patient management decisions. A negative result may occur with  improper specimen collection/handling, submission of specimen other than nasopharyngeal swab, presence of viral mutation(s) within the areas targeted by this assay, and inadequate number of viral copies(<138 copies/mL). A negative result must be combined with clinical observations, patient history, and epidemiological information. The expected result is Negative.  Fact Sheet for Patients:  BloggerCourse.com  Fact Sheet for Healthcare Providers:  SeriousBroker.it  This test is no t yet approved or cleared by the [place] FDA and  has been authorized for detection and/or diagnosis of SARS-CoV-2 by FDA under an Emergency Use Authorization (EUA). This EUA will remain  in effect (meaning this test can be used) for the duration of the COVID-19 declaration under Section 564(b)(1) of the Act, 21 U.S.C.section 360bbb-3(b)(1), unless the authorization is terminated  or revoked sooner.       Influenza A by PCR POSITIVE (A) NEGATIVE Final   Influenza B by PCR NEGATIVE NEGATIVE Final    Comment: (NOTE) The Xpert Xpress SARS-CoV-2/FLU/RSV plus assay is intended as an aid in the diagnosis of influenza from Nasopharyngeal swab specimens and should not be used as a sole basis for treatment. Nasal washings  and aspirates are unacceptable for Xpert Xpress SARS-CoV-2/FLU/RSV testing.  Fact Sheet for Patients: BloggerCourse.com  Fact Sheet for Healthcare Providers: SeriousBroker.it  This test is not yet approved or cleared by the [place] FDA and has been authorized for detection and/or diagnosis of SARS-CoV-2 by FDA under an Emergency Use Authorization (EUA). This EUA will remain in effect (meaning this test can be used) for the duration of the COVID-19 declaration under Section 564(b)(1) of the Act, 21 U.S.C. section 360bbb-3(b)(1), unless the authorization is terminated or revoked.  Performed at [facility], [address], [place], [place] [address]      Radiological Exams on Admission: DG Chest Port 1 View  Result Date: 05/26/2022 CLINICAL DATA:  A 61 year old male presents with shortness of breath. EXAM: PORTABLE CHEST 1 VIEW COMPARISON:  December 04, 2021 FINDINGS: The heart size and mediastinal contours are within normal limits. Both lungs are clear. The visualized skeletal structures are unremarkable. IMPRESSION: No active disease. Electronically Signed   By: Donzetta Kohut M.D.   On: 05/26/2022 13:05    Assessment/Plan Present on Admission:  Acute on chronic respiratory failure with hypoxia (HCC)  Influenza A -Admit to MedSurg -Tamiflu ordered -Oxygen to keep sats greater than 88% -Scheduled and as needed nebulizer treatments -Antitussive as needed   AKI (acute kidney injury) (HCC) -IV fluid hydration -BMP in a.m.  Tobacco use -Nicotine patch ordered   MDD (major depressive disorder), severe (HCC)  Cocaine dependence with cocaine-induced mood disorder (HCC)   Sharryn Belding 05/26/2022, 9:43 PM

## 2022-05-26 NOTE — ED Triage Notes (Signed)
Pt arrived via POV, c/o SOB x2 days. Denies any chest pain.

## 2022-05-26 NOTE — ED Provider Notes (Signed)
[facility]-EMERGENCY DEPT Provider Note   CSN: [id number] Arrival date & time: 05/26/22  1233     History  Chief Complaint  Patient presents with   Shortness of Breath    Arrian Manson is a 61 y.o. male.  With past medical history of polysubstance abuse, depression who presents to the emergency department with shortness of breath.  States that over the past 2 days he has had shortness of breath.  States that he has had shortness of breath with ambulating and has begun to have a nonproductive cough.  Denies feeling lightheaded or dizzy or having syncope.  He denies having any chest pain, palpitations, lower extremity swelling, nausea, vomiting or diarrhea, recent long flights or drives, history of cancer, blood clots, history of COPD or asthma.   Shortness of Breath Associated symptoms: cough   Associated symptoms: no chest pain and no fever        Home Medications Prior to Admission medications   Medication Sig Start Date End Date Taking? Authorizing Provider  hydrOXYzine (ATARAX) 25 MG tablet Take 1 tablet (25 mg total) by mouth 3 (three) times daily as needed for anxiety (Sleep). 12/08/21   Starleen Blue, NP  sertraline (ZOLOFT) 50 MG tablet Take 1 tablet (50 mg total) by mouth daily. 12/08/21 02/06/22  Starleen Blue, NP  traZODone (DESYREL) 50 MG tablet Take 1 tablet (50 mg total) by mouth at bedtime as needed for sleep. 12/08/21   Starleen Blue, NP      Allergies    Patient has no known allergies.    Review of Systems   Review of Systems  Constitutional:  Negative for fever.  Respiratory:  Positive for cough and shortness of breath.   Cardiovascular:  Negative for chest pain, palpitations and leg swelling.  All other systems reviewed and are negative.   Physical Exam Updated Vital Signs BP (!) 97/49   Pulse 88   Temp 99.4 F (37.4 C)   Resp 20   SpO2 100%  Physical Exam Vitals and nursing note reviewed.  Constitutional:      General:  He is not in acute distress.    Appearance: Normal appearance. He is well-developed and normal weight. He is ill-appearing. He is not toxic-appearing.  HENT:     Head: Normocephalic.     Mouth/Throat:     Mouth: Mucous membranes are moist.  Eyes:     General: No scleral icterus.    Extraocular Movements: Extraocular movements intact.     Pupils: Pupils are equal, round, and reactive to light.  Neck:     Vascular: No JVD.  Cardiovascular:     Rate and Rhythm: Normal rate and regular rhythm.     Pulses: Normal pulses.     Heart sounds: No murmur heard. Pulmonary:     Effort: Pulmonary effort is normal. No tachypnea or respiratory distress.     Breath sounds: Decreased breath sounds present. No wheezing, rhonchi or rales.  Chest:     Chest wall: No tenderness.  Abdominal:     General: Bowel sounds are normal.     Palpations: Abdomen is soft.  Musculoskeletal:        General: Normal range of motion.     Cervical back: Normal range of motion.     Right lower leg: No edema.     Left lower leg: No edema.  Skin:    General: Skin is warm and dry.     Capillary Refill: Capillary refill takes less  than 2 seconds.  Neurological:     General: No focal deficit present.     Mental Status: He is alert and oriented to person, place, and time.  Psychiatric:        Mood and Affect: Mood normal.        Behavior: Behavior normal.     ED Results / Procedures / Treatments   Labs (all labs ordered are listed, but only abnormal results are displayed) Labs Reviewed  RESP PANEL BY RT-PCR (FLU A&B, COVID) ARPGX2 - Abnormal; Notable for the following components:      Result Value   Influenza A by PCR POSITIVE (*)    All other components within normal limits  BASIC METABOLIC PANEL - Abnormal; Notable for the following components:   BUN 30 (*)    Creatinine, Ser 1.74 (*)    GFR, Estimated 45 (*)    All other components within normal limits  CBC WITH DIFFERENTIAL/PLATELET  BRAIN NATRIURETIC  PEPTIDE  TROPONIN I (HIGH SENSITIVITY)  TROPONIN I (HIGH SENSITIVITY)    EKG EKG Interpretation  Date/Time:  Tuesday May 26 2022 12:38:30 EST Ventricular Rate:  89 PR Interval:  131 QRS Duration: 91 QT Interval:  426 QTC Calculation: 519 R Axis:   83 Text Interpretation: Sinus rhythm Biatrial enlargement RSR' in V1 or V2, right VCD or RVH Prolonged QT interval Confirmed by Ernie Avena ([id number]) on 05/26/2022 9:12:25 PM  Radiology DG Chest Port 1 View  Result Date: 05/26/2022 CLINICAL DATA:  A 61 year old male presents with shortness of breath. EXAM: PORTABLE CHEST 1 VIEW COMPARISON:  December 04, 2021 FINDINGS: The heart size and mediastinal contours are within normal limits. Both lungs are clear. The visualized skeletal structures are unremarkable. IMPRESSION: No active disease. Electronically Signed   By: Donzetta Kohut M.D.   On: 05/26/2022 13:05    Procedures Procedures   Medications Ordered in ED Medications  sodium chloride 0.9 % bolus 1,000 mL (has no administration in time range)  oseltamivir (TAMIFLU) capsule 75 mg (has no administration in time range)    ED Course/ Medical Decision Making/ A&P                           Medical Decision Making Amount and/or Complexity of Data Reviewed Labs: ordered. Radiology: ordered.  Risk Prescription drug management. Decision regarding hospitalization.  Initial Impression and Ddx 60 year old male who presents to the emergency department with shortness of breath for 2 days.  On my assessment when he is roomed he is in no acute distress, nonseptic or toxic in appearance.  Hemodynamically stable.  Has mildly decreased lung sounds but there are no adventitious lung sounds.  No evidence of fluid volume overload.  Obtaining ACS workup as well as a BNP, COVID and flu. Patient PMH that increases complexity of ED encounter: Polysubstance abuse Initial differential: ACS, PE, pneumothorax, pleural effusion, cardiac tamponade, aortic  dissection, COPD exacerbation, pneumonia, asthma exacerbation, congestive heart failure, viral upper respiratory infection, medication side effect, anaphylaxis, etc.    Interpretation of Diagnostics I independent reviewed and interpreted the labs as followed: influenza positive, AKI Cr 1.74, cbc wnl, bnp wnl, troponin negative   - I independently visualized the following imaging with scope of interpretation limited to determining acute life threatening conditions related to emergency care: CXR, which revealed no acute findings   Patient Reassessment and Ultimate Disposition/Management On reassessment patient respiratory status improved from when I saw him in triage.  His lungs  continue to be clear but diminished.  He does have AKI with creatinine 1.47 I had added on IV fluids.  We ambulated the patient to determine his oxygenation.  He dropped to 87% walking in the hallway. Workup was consistent with acute influenza with new oxygen requirement.  He does not have an overt pneumonia.  Ordered Tamiflu. Troponins are negative, EKG without ischemia or fraction doubt ACS. Chest x-ray without pneumothorax, pleural effusion Considered but doubt PE.  Wells low risk.  Will not obtain D-dimer or CTA PE study. Does not appear fluid volume overloaded, BNP is negative so doubt CHF exacerbation. He does not have a history of COPD or asthma, however his lungs appear mildly hyperinflated on his imaging by my interpretation.  Doubt COPD exacerbation at this time though.  Consulted and spoke with Dr. Joneen Roach who agrees to admit patient for influenza, hypoxia  Patient management required discussion with the following services or consulting groups:  Hospitalist Service  Complexity of Problems Addressed Acute complicated illness or Injury  Additional Data Reviewed and Analyzed Further history obtained from: Prior ED visit notes, Care Everywhere, and Prior labs/imaging results  Patient Encounter Risk  Assessment SDOH impact on management and Consideration of hospitalization  Final Clinical Impression(s) / ED Diagnoses Final diagnoses:  Influenza A  Hypoxia    Rx / DC Orders ED Discharge Orders     None         [name], PA-C 05/26/22 2219    Ernie Avena, MD 05/26/22 2328

## 2022-05-26 NOTE — ED Notes (Signed)
Pt ambulated in hallways. Labored breathing from bed to standing.   Began 02 @ 95% room air and hr - 123  Ambulated ~30 feet and pt began staggering and had near syncopal episode. Assisted to wheelchair.   02 @85 % room air and hr 133.   Assisted back to bed and placed on 2L nasal cannula.

## 2022-05-26 NOTE — ED Provider Triage Note (Signed)
Emergency Medicine Provider Triage Evaluation Note  Richard Moon , a 61 y.o. male  was evaluated in triage.  Pt complains of SOB. States he has been SOB for two days. States he cannot walk an moderate distance without becoming significantly short of breath. Has non productive cough without fever. Denies chest pain, palpitations, lower extremity swelling, recent long flights or drives, history of cancer, history of clots, history of COPD or asthma, does not use inhalers.  Review of Systems  Positive: See above Negative:   Physical Exam  BP (!) 148/122   Pulse 93   Resp 12   SpO2 100%  Gen:   Awake, moderate distress Resp:  Tachypneic, lung sounds are mildly diminished without wheezing, rhonchi, rales  MSK:   Moves extremities without difficulty  Other:  S1/s2 without mumur, no LE swelling  Medical Decision Making  Medically screening exam initiated at 12:38 PM.  Appropriate orders placed.  [name] was informed that the remainder of the evaluation will be completed by another provider, this initial triage assessment does not replace that evaluation, and the importance of remaining in the ED until their evaluation is complete.      [name], PA-C 05/26/22 1239

## 2022-05-27 DIAGNOSIS — J9621 Acute and chronic respiratory failure with hypoxia: Secondary | ICD-10-CM | POA: Diagnosis not present

## 2022-05-27 DIAGNOSIS — J101 Influenza due to other identified influenza virus with other respiratory manifestations: Secondary | ICD-10-CM | POA: Diagnosis not present

## 2022-05-27 LAB — CBC WITH DIFFERENTIAL/PLATELET
Abs Immature Granulocytes: 0.02 10*3/uL (ref 0.00–0.07)
Basophils Absolute: 0 10*3/uL (ref 0.0–0.1)
Basophils Relative: 0 %
Eosinophils Absolute: 0 10*3/uL (ref 0.0–0.5)
Eosinophils Relative: 0 %
HCT: 38.5 % — ABNORMAL LOW (ref 39.0–52.0)
Hemoglobin: 12.5 g/dL — ABNORMAL LOW (ref 13.0–17.0)
Immature Granulocytes: 0 %
Lymphocytes Relative: 22 %
Lymphs Abs: 1.2 10*3/uL (ref 0.7–4.0)
MCH: 32 pg (ref 26.0–34.0)
MCHC: 32.5 g/dL (ref 30.0–36.0)
MCV: 98.5 fL (ref 80.0–100.0)
Monocytes Absolute: 0.5 10*3/uL (ref 0.1–1.0)
Monocytes Relative: 10 %
Neutro Abs: 3.6 10*3/uL (ref 1.7–7.7)
Neutrophils Relative %: 68 %
Platelets: 157 10*3/uL (ref 150–400)
RBC: 3.91 MIL/uL — ABNORMAL LOW (ref 4.22–5.81)
RDW: 13.4 % (ref 11.5–15.5)
WBC: 5.3 10*3/uL (ref 4.0–10.5)
nRBC: 0 % (ref 0.0–0.2)

## 2022-05-27 LAB — BASIC METABOLIC PANEL
Anion gap: 7 (ref 5–15)
BUN: 24 mg/dL — ABNORMAL HIGH (ref 6–20)
CO2: 21 mmol/L — ABNORMAL LOW (ref 22–32)
Calcium: 7.5 mg/dL — ABNORMAL LOW (ref 8.9–10.3)
Chloride: 106 mmol/L (ref 98–111)
Creatinine, Ser: 1.43 mg/dL — ABNORMAL HIGH (ref 0.61–1.24)
GFR, Estimated: 56 mL/min — ABNORMAL LOW (ref >60)
Glucose, Bld: 106 mg/dL — ABNORMAL HIGH (ref 70–99)
Potassium: 3.8 mmol/L (ref 3.5–5.1)
Sodium: 134 mmol/L — ABNORMAL LOW (ref 135–145)

## 2022-05-27 LAB — RAPID URINE DRUG SCREEN, HOSP PERFORMED
Amphetamines: NOT DETECTED
Barbiturates: NOT DETECTED
Benzodiazepines: NOT DETECTED
Cocaine: NOT DETECTED
Opiates: NOT DETECTED
Tetrahydrocannabinol: NOT DETECTED

## 2022-05-27 LAB — HIV ANTIBODY (ROUTINE TESTING W REFLEX): HIV Screen 4th Generation wRfx: NONREACTIVE

## 2022-05-27 MED ORDER — SODIUM CHLORIDE 0.9 % IV BOLUS
1000.0000 mL | Freq: Once | INTRAVENOUS | Status: AC
  Administered 2022-05-27: 1000 mL via INTRAVENOUS

## 2022-05-27 MED ORDER — NICOTINE 21 MG/24HR TD PT24
21.0000 mg | MEDICATED_PATCH | Freq: Every day | TRANSDERMAL | Status: DC
  Filled 2022-05-27: qty 1

## 2022-05-27 NOTE — Progress Notes (Signed)
PROGRESS NOTE  Deavion Dobbs NWG:[id number] DOB: 01-14-1961 DOA: 05/26/2022 PCP: Patient, No Pcp Per  HPI/Recap of past 38 hours: 61 year old male with no significant past medical history. Pt reported progressive SOB and significant dyspnea for the past few days. Pt reports been extremely exhausted/fatigued and complains of a dry mild cough. Reports fever/chills. In the ER the patient was influenza A positive with hypoxia into the 80s, started on 2 L of oxygen. Patient's Tmax is 101.6.  Patient admitted for further management.   Today, patient reports improvement, denies any worsening shortness of breath/cough, denies any chest pain, abdominal pain, nausea/vomiting.   Assessment/Plan: Principal Problem:   Acute on chronic respiratory failure with hypoxia (HCC) Active Problems:   MDD (major depressive disorder), severe (HCC)   Cocaine dependence with cocaine-induced mood disorder (HCC)   Cocaine-induced mood disorder (HCC)   Polysubstance abuse (HCC)   Influenza A   AKI (acute kidney injury) (HCC)   Acute respiratory failure with hypoxemia (HCC)   Influenza A infection Acute respiratory failure with hypoxia Currently febrile, with no leukocytosis Influenza A positive Chest x-ray unremarkable Started on Tamiflu Continue supportive treatment, cough suppressant, DuoNebs Supplemental O2 as needed  AKI Creatinine improving Continue IV fluids Daily BMP  Tobacco abuse Advised to quit Nicotine patch ordered  History of polysubstance abuse Patient denies UDS neg/unremarkable       Code Status: Full  Family Communication: None at bedside  Disposition Plan: Status is: Inpatient Remains inpatient appropriate because: Level of care      Consultants: None  Procedures: None  Antimicrobials: None  DVT prophylaxis: Lovenox   Objective: Vitals:   05/27/22 0830 05/27/22 0947 05/27/22 1030 05/27/22 1200  BP: (!) 94/58  104/87 90/61  Pulse: 78 86 85 69   Resp: 18  18 18   Temp:  98.6 F (37 C)    TempSrc:  Oral    SpO2: 97% 99% 97% 96%   No intake or output data in the 24 hours ending 05/27/22 1427 There were no vitals filed for this visit.  Exam: General: NAD  Cardiovascular: S1, S2 present Respiratory: CTAB Abdomen: Soft, nontender, nondistended, bowel sounds present Musculoskeletal: No bilateral pedal edema noted Skin: Normal Psychiatry: Normal mood     Data Reviewed: CBC: Recent Labs  Lab 05/26/22 1422 05/26/22 2318 05/27/22 0425  WBC 8.1 6.1 5.3  NEUTROABS 6.0  --  3.6  HGB 16.5 15.9 12.5*  HCT 49.1 48.3 38.5*  MCV 97.2 97.0 98.5  PLT 188 192 157   Basic Metabolic Panel: Recent Labs  Lab 05/26/22 1422 05/26/22 2318 05/27/22 0425  NA 136  --  134*  K 4.2  --  3.8  CL 102  --  106  CO2 24  --  21*  GLUCOSE 90  --  106*  BUN 30*  --  24*  CREATININE 1.74* 1.60* 1.43*  CALCIUM 8.9  --  7.5*   GFR: CrCl cannot be calculated (Unknown ideal weight.). Liver Function Tests: No results for input(s): "AST", "ALT", "ALKPHOS", "BILITOT", "PROT", "ALBUMIN" in the last 168 hours. No results for input(s): "LIPASE", "AMYLASE" in the last 168 hours. No results for input(s): "AMMONIA" in the last 168 hours. Coagulation Profile: No results for input(s): "INR", "PROTIME" in the last 168 hours. Cardiac Enzymes: No results for input(s): "CKTOTAL", "CKMB", "CKMBINDEX", "TROPONINI" in the last 168 hours. BNP (last 3 results) No results for input(s): "PROBNP" in the last 8760 hours. HbA1C: No results for input(s): "HGBA1C" in the last 72  hours. CBG: No results for input(s): "GLUCAP" in the last 168 hours. Lipid Profile: No results for input(s): "CHOL", "HDL", "LDLCALC", "TRIG", "CHOLHDL", "LDLDIRECT" in the last 72 hours. Thyroid Function Tests: No results for input(s): "TSH", "T4TOTAL", "FREET4", "T3FREE", "THYROIDAB" in the last 72 hours. Anemia Panel: No results for input(s): "VITAMINB12", "FOLATE", "FERRITIN",  "TIBC", "IRON", "RETICCTPCT" in the last 72 hours. Urine analysis:    Component Value Date/Time   COLORURINE YELLOW 12/04/2021 0404   APPEARANCEUR TURBID (A) 12/04/2021 0404   LABSPEC >1.030 (H) 12/04/2021 0404   PHURINE 5.5 12/04/2021 0404   GLUCOSEU NEGATIVE 12/04/2021 0404   HGBUR NEGATIVE 12/04/2021 0404   BILIRUBINUR NEGATIVE 12/04/2021 0404   KETONESUR NEGATIVE 12/04/2021 0404   PROTEINUR NEGATIVE 12/04/2021 0404   NITRITE NEGATIVE 12/04/2021 0404   LEUKOCYTESUR NEGATIVE 12/04/2021 0404   Sepsis Labs: @LABRCNTIP (procalcitonin:4,lacticidven:4)  ) Recent Results (from the past 240 hour(s))  Resp Panel by RT-PCR (Flu A&B, Covid) Anterior Nasal Swab     Status: Abnormal   Collection Time: 05/26/22  8:12 PM   Specimen: Anterior Nasal Swab  Result Value Ref Range Status   SARS Coronavirus 2 by RT PCR NEGATIVE NEGATIVE Final    Comment: (NOTE) SARS-CoV-2 target nucleic acids are NOT DETECTED.  The SARS-CoV-2 RNA is generally detectable in upper respiratory specimens during the acute phase of infection. The lowest concentration of SARS-CoV-2 viral copies this assay can detect is 138 copies/mL. A negative result does not preclude SARS-Cov-2 infection and should not be used as the sole basis for treatment or other patient management decisions. A negative result may occur with  improper specimen collection/handling, submission of specimen other than nasopharyngeal swab, presence of viral mutation(s) within the areas targeted by this assay, and inadequate number of viral copies(<138 copies/mL). A negative result must be combined with clinical observations, patient history, and epidemiological information. The expected result is Negative.  Fact Sheet for Patients:  14/05/23  Fact Sheet for Healthcare Providers:  BloggerCourse.com  This test is no t yet approved or cleared by the [place] FDA and  has been  authorized for detection and/or diagnosis of SARS-CoV-2 by FDA under an Emergency Use Authorization (EUA). This EUA will remain  in effect (meaning this test can be used) for the duration of the COVID-19 declaration under Section 564(b)(1) of the Act, 21 U.S.C.section 360bbb-3(b)(1), unless the authorization is terminated  or revoked sooner.       Influenza A by PCR POSITIVE (A) NEGATIVE Final   Influenza B by PCR NEGATIVE NEGATIVE Final    Comment: (NOTE) The Xpert Xpress SARS-CoV-2/FLU/RSV plus assay is intended as an aid in the diagnosis of influenza from Nasopharyngeal swab specimens and should not be used as a sole basis for treatment. Nasal washings and aspirates are unacceptable for Xpert Xpress SARS-CoV-2/FLU/RSV testing.  Fact Sheet for Patients: [place]  Fact Sheet for Healthcare Providers: BloggerCourse.com  This test is not yet approved or cleared by the [place] FDA and has been authorized for detection and/or diagnosis of SARS-CoV-2 by FDA under an Emergency Use Authorization (EUA). This EUA will remain in effect (meaning this test can be used) for the duration of the COVID-19 declaration under Section 564(b)(1) of the Act, 21 U.S.C. section 360bbb-3(b)(1), unless the authorization is terminated or revoked.  Performed at [facility], [address] [place], [place]       Studies: No results found.  Scheduled Meds:  enoxaparin (LOVENOX) injection  40 mg Subcutaneous Q24H  ipratropium-albuterol  3 mL Nebulization Q6H   nicotine  14 mg Transdermal Daily   oseltamivir  30 mg Oral BID    Continuous Infusions:  sodium chloride Stopped (05/27/22 0955)     LOS: 1 day     Briant Cedar, MD [facility]  If 7PM-7AM, please contact night-coverage [email or web address] 05/27/2022, 2:27 PM

## 2022-05-27 NOTE — ED Notes (Signed)
Let the RN know of the pt's temp of 101.6

## 2022-05-27 NOTE — ED Notes (Signed)
ED TO INPATIENT HANDOFF REPORT  ED Nurse Name and Phone #: Elpidio Eric [phone]  S Name/Age/Gender Richard Moon 61 y.o. male Room/Bed: WA13/WA13  Code Status   Code Status: Full Code  Home/SNF/Other Home Patient oriented to: self, place, time, and situation Is this baseline? Yes   Triage Complete: Triage complete  Chief Complaint Acute respiratory failure with hypoxemia ([place]) [J96.01]  Triage Note Pt arrived via POV, c/o SOB x2 days. Denies any chest pain.    Allergies No Known Allergies  Level of Care/Admitting Diagnosis ED Disposition     ED Disposition  Admit   Condition  --   Comment  Hospital Area: [place] [[id number]]  Level of Care: Med-Surg [16]  May admit patient to [facility] or [facility] if equivalent level of care is available:: Yes  Covid Evaluation: Asymptomatic - no recent exposure (last 10 days) testing not required  Diagnosis: Acute respiratory failure with hypoxemia [id number]  Admitting Physician: [place], [place]  Attending Physician: Quintella Baton [id number]  Certification:: I certify this patient will need inpatient services for at least 2 midnights  Estimated Length of Stay: 2          B Medical/Surgery History History reviewed. No pertinent past medical history. Past Surgical History:  Procedure Laterality Date   HERNIA REPAIR       A IV Location/Drains/Wounds Patient Lines/Drains/Airways Status     Active Line/Drains/Airways     Name Placement date Placement time Site Days   Peripheral IV 05/26/22 18 G 1.16" Left Antecubital 05/26/22  2236  Antecubital  1            Intake/Output Last 24 hours No intake or output data in the 24 hours ending 05/27/22 2041  Labs/Imaging Results for orders placed or performed during the hospital encounter of 05/26/22 (from the past 48 hour(s))  Brain natriuretic peptide     Status: None   Collection Time: 05/26/22  2:00 PM  Result Value Ref Range   B  Natriuretic Peptide 27.5 0.0 - 100.0 pg/mL    Comment: Performed at [facility], [place] [address] [place], [place] [id number]  Basic metabolic panel     Status: Abnormal   Collection Time: 05/26/22  2:22 PM  Result Value Ref Range   Sodium 136 135 - 145 mmol/L   Potassium 4.2 3.5 - 5.1 mmol/L   Chloride 102 98 - 111 mmol/L   CO2 24 22 - 32 mmol/L   Glucose, Bld 90 70 - 99 mg/dL    Comment: Glucose reference range applies only to samples taken after fasting for at least 8 hours.   BUN 30 (H) 6 - 20 mg/dL   Creatinine, Ser 1.74 (H) 0.61 - 1.24 mg/dL   Calcium 8.9 8.9 - 10.3 mg/dL   GFR, Estimated 45 (L) >60 mL/min    Comment: (NOTE) Calculated using the CKD-EPI Creatinine Equation (2021)    Anion gap 10 5 - 15    Comment: Performed at [facility], [place] [address] [place],  [address]  Troponin I (High Sensitivity)     Status: None   Collection Time: 05/26/22  2:22 PM  Result Value Ref Range   Troponin I (High Sensitivity) 7 <18 ng/L    Comment: (NOTE) Elevated high sensitivity troponin I (hsTnI) values and significant  changes across serial measurements may suggest ACS but many other  chronic and acute conditions are known to elevate hsTnI results.  Refer to the "  Links" section for chest pain algorithms and additional  guidance. Performed at [facility], [place] [address], [place], [place] [address]   CBC with Differential     Status: None   Collection Time: 05/26/22  2:22 PM  Result Value Ref Range   WBC 8.1 4.0 - 10.5 K/uL   RBC 5.05 4.22 - 5.81 MIL/uL   Hemoglobin 16.5 13.0 - 17.0 g/dL   HCT 49.1 39.0 - 52.0 %   MCV 97.2 80.0 - 100.0 fL   MCH 32.7 26.0 - 34.0 pg   MCHC 33.6 30.0 - 36.0 g/dL   RDW 13.5 11.5 - 15.5 %   Platelets 188 150 - 400 K/uL   nRBC 0.0 0.0 - 0.2 %   Neutrophils Relative % 74 %   Neutro Abs 6.0 1.7 - 7.7 K/uL   Lymphocytes Relative 14 %   Lymphs Abs 1.2 0.7 - 4.0 K/uL   Monocytes  Relative 12 %   Monocytes Absolute 0.9 0.1 - 1.0 K/uL   Eosinophils Relative 0 %   Eosinophils Absolute 0.0 0.0 - 0.5 K/uL   Basophils Relative 0 %   Basophils Absolute 0.0 0.0 - 0.1 K/uL   Immature Granulocytes 0 %   Abs Immature Granulocytes 0.02 0.00 - 0.07 K/uL    Comment: Performed at [facility], [place] [address] [place], [place] [address]  Resp Panel by RT-PCR (Flu A&B, Covid) Anterior Nasal Swab     Status: Abnormal   Collection Time: 05/26/22  8:12 PM   Specimen: Anterior Nasal Swab  Result Value Ref Range   SARS Coronavirus 2 by RT PCR NEGATIVE NEGATIVE    Comment: (NOTE) SARS-CoV-2 target nucleic acids are NOT DETECTED.  The SARS-CoV-2 RNA is generally detectable in upper respiratory specimens during the acute phase of infection. The lowest concentration of SARS-CoV-2 viral copies this assay can detect is 138 copies/mL. A negative result does not preclude SARS-Cov-2 infection and should not be used as the sole basis for treatment or other patient management decisions. A negative result may occur with  improper specimen collection/handling, submission of specimen other than nasopharyngeal swab, presence of viral mutation(s) within the areas targeted by this assay, and inadequate number of viral copies(<138 copies/mL). A negative result must be combined with clinical observations, patient history, and epidemiological information. The expected result is Negative.  Fact Sheet for Patients:  EntrepreneurPulse.com.au  Fact Sheet for Healthcare Providers:  IncredibleEmployment.be  This test is no t yet approved or cleared by the [place] FDA and  has been authorized for detection and/or diagnosis of SARS-CoV-2 by FDA under an Emergency Use Authorization (EUA). This EUA will remain  in effect (meaning this test can be used) for the duration of the COVID-19 declaration under Section 564(b)(1) of the Act,  21 U.S.C.section 360bbb-3(b)(1), unless the authorization is terminated  or revoked sooner.       Influenza A by PCR POSITIVE (A) NEGATIVE   Influenza B by PCR NEGATIVE NEGATIVE    Comment: (NOTE) The Xpert Xpress SARS-CoV-2/FLU/RSV plus assay is intended as an aid in the diagnosis of influenza from Nasopharyngeal swab specimens and should not be used as a sole basis for treatment. Nasal washings and aspirates are unacceptable for Xpert Xpress SARS-CoV-2/FLU/RSV testing.  Fact Sheet for Patients: EntrepreneurPulse.com.au  Fact Sheet for Healthcare Providers: IncredibleEmployment.be  This test is not yet approved or cleared by the [place] FDA and has been authorized for detection and/or diagnosis of SARS-CoV-2 by FDA under  an Emergency Use Authorization (EUA). This EUA will remain in effect (meaning this test can be used) for the duration of the COVID-19 declaration under Section 564(b)(1) of the Act, 21 U.S.C. section 360bbb-3(b)(1), unless the authorization is terminated or revoked.  Performed at [facility], [place] [address], [place], [place] [address]   Troponin I (High Sensitivity)     Status: None   Collection Time: 05/26/22  8:12 PM  Result Value Ref Range   Troponin I (High Sensitivity) 6 <18 ng/L    Comment: (NOTE) Elevated high sensitivity troponin I (hsTnI) values and significant  changes across serial measurements may suggest ACS but many other  chronic and acute conditions are known to elevate hsTnI results.  Refer to the "Links" section for chest pain algorithms and additional  guidance. Performed at [facility] [place], [place] [address], [place], [place] [address]   HIV Antibody (routine testing w rflx)     Status: None   Collection Time: 05/26/22 11:18 PM  Result Value Ref Range   HIV Screen 4th Generation wRfx Non Reactive Non Reactive    Comment: Performed at [facility],  [place] [address] [place], [place] [address]  CBC     Status: None   Collection Time: 05/26/22 11:18 PM  Result Value Ref Range   WBC 6.1 4.0 - 10.5 K/uL   RBC 4.98 4.22 - 5.81 MIL/uL   Hemoglobin 15.9 13.0 - 17.0 g/dL   HCT 48.3 39.0 - 52.0 %   MCV 97.0 80.0 - 100.0 fL   MCH 31.9 26.0 - 34.0 pg   MCHC 32.9 30.0 - 36.0 g/dL   RDW 13.4 11.5 - 15.5 %   Platelets 192 150 - 400 K/uL   nRBC 0.0 0.0 - 0.2 %    Comment: Performed at [facility], [place] [address], [place], [place] [address]  Creatinine, serum     Status: Abnormal   Collection Time: 05/26/22 11:18 PM  Result Value Ref Range   Creatinine, Ser 1.60 (H) 0.61 - 1.24 mg/dL   GFR, Estimated 49 (L) >60 mL/min    Comment: (NOTE) Calculated using the CKD-EPI Creatinine Equation (2021) Performed at [facility], [place] [address], [place], [place] [id number]   Basic metabolic panel     Status: Abnormal   Collection Time: 05/27/22  4:25 AM  Result Value Ref Range   Sodium 134 (L) 135 - 145 mmol/L   Potassium 3.8 3.5 - 5.1 mmol/L   Chloride 106 98 - 111 mmol/L   CO2 21 (L) 22 - 32 mmol/L   Glucose, Bld 106 (H) 70 - 99 mg/dL    Comment: Glucose reference range applies only to samples taken after fasting for at least 8 hours.   BUN 24 (H) 6 - 20 mg/dL   Creatinine, Ser 1.43 (H) 0.61 - 1.24 mg/dL   Calcium 7.5 (L) 8.9 - 10.3 mg/dL   GFR, Estimated 56 (L) >60 mL/min    Comment: (NOTE) Calculated using the CKD-EPI Creatinine Equation (2021)    Anion gap 7 5 - 15    Comment: Performed at [facility], [place] [address], [place], [place] [address]  CBC with Differential/Platelet     Status: Abnormal   Collection Time: 05/27/22  4:25 AM  Result Value Ref Range   WBC 5.3 4.0 - 10.5 K/uL   RBC 3.91 (L) 4.22 - 5.81 MIL/uL   Hemoglobin 12.5 (L) 13.0 - 17.0 g/dL  HCT 38.5 (L) 39.0 - 52.0 %   MCV 98.5 80.0 - 100.0 fL   MCH 32.0 26.0 - 34.0 pg   MCHC 32.5 30.0 - 36.0 g/dL   RDW 13.4 11.5 -  15.5 %   Platelets 157 150 - 400 K/uL   nRBC 0.0 0.0 - 0.2 %   Neutrophils Relative % 68 %   Neutro Abs 3.6 1.7 - 7.7 K/uL   Lymphocytes Relative 22 %   Lymphs Abs 1.2 0.7 - 4.0 K/uL   Monocytes Relative 10 %   Monocytes Absolute 0.5 0.1 - 1.0 K/uL   Eosinophils Relative 0 %   Eosinophils Absolute 0.0 0.0 - 0.5 K/uL   Basophils Relative 0 %   Basophils Absolute 0.0 0.0 - 0.1 K/uL   Immature Granulocytes 0 %   Abs Immature Granulocytes 0.02 0.00 - 0.07 K/uL    Comment: Performed at [facility], [place] [address] [place], [place] [address]  Rapid urine drug screen (hospital performed)     Status: None   Collection Time: 05/27/22  9:31 AM  Result Value Ref Range   Opiates NONE DETECTED NONE DETECTED   Cocaine NONE DETECTED NONE DETECTED   Benzodiazepines NONE DETECTED NONE DETECTED   Amphetamines NONE DETECTED NONE DETECTED   Tetrahydrocannabinol NONE DETECTED NONE DETECTED   Barbiturates NONE DETECTED NONE DETECTED    Comment: (NOTE) DRUG SCREEN FOR MEDICAL PURPOSES ONLY.  IF CONFIRMATION IS NEEDED FOR ANY PURPOSE, NOTIFY LAB WITHIN 5 DAYS.  LOWEST DETECTABLE LIMITS FOR URINE DRUG SCREEN Drug Class                     Cutoff (ng/mL) Amphetamine and metabolites    1000 Barbiturate and metabolites    200 Benzodiazepine                 200 Opiates and metabolites        300 Cocaine and metabolites        300 THC                            50 Performed at [facility], [place] [address] [place], [place] [address]    DG Chest Port 1 View  Result Date: 05/26/2022 CLINICAL DATA:  A 61 year old male presents with shortness of breath. EXAM: PORTABLE CHEST 1 VIEW COMPARISON:  December 04, 2021 FINDINGS: The heart size and mediastinal contours are within normal limits. Both lungs are clear. The visualized skeletal structures are unremarkable. IMPRESSION: No active disease. Electronically Signed   By: Zetta Bills M.D.   On: 05/26/2022 13:05     Pending Labs Unresulted Labs (From admission, onward)     Start     Ordered   05/28/22 0500  CBC with Differential/Platelet  Daily,   R      05/27/22 1445   05/28/22 [id number]  Basic metabolic panel  Daily,   R      05/27/22 1445            Vitals/Pain Today's Vitals   05/27/22 1523 05/27/22 1641 05/27/22 1830 05/27/22 1942  BP:  92/64 101/73   Pulse:  76 71   Resp:  18 18   Temp:  98.4 F (36.9 C)  98.4 F (36.9 C)  TempSrc:      SpO2: 100% 94% 100%     Isolation Precautions Droplet precaution  Medications Medications  guaiFENesin (ROBITUSSIN) 100 MG/5ML liquid  5 mL (has no administration in time range)  enoxaparin (LOVENOX) injection 40 mg (40 mg Subcutaneous Patient Refused/Not Given 05/27/22 1027)  acetaminophen (TYLENOL) tablet 650 mg (650 mg Oral Given 05/27/22 0130)    Or  acetaminophen (TYLENOL) suppository 650 mg ( Rectal See Alternative 05/27/22 0130)  senna-docusate (Senokot-S) tablet 1 tablet (has no administration in time range)  albuterol (PROVENTIL) (2.5 MG/3ML) 0.083% nebulizer solution 2.5 mg (has no administration in time range)  ipratropium-albuterol (DUONEB) 0.5-2.5 (3) MG/3ML nebulizer solution 3 mL (3 mLs Nebulization Given 05/27/22 1523)  oseltamivir (TAMIFLU) capsule 30 mg (30 mg Oral Given 05/27/22 1027)  0.9 %  sodium chloride infusion (0 mLs Intravenous Stopped 05/27/22 0955)  nicotine (NICODERM CQ - dosed in mg/24 hours) patch 21 mg (has no administration in time range)  sodium chloride 0.9 % bolus 1,000 mL (0 mLs Intravenous Stopped 05/26/22 2318)  oseltamivir (TAMIFLU) capsule 75 mg (75 mg Oral Given 05/26/22 2238)  sodium chloride 0.9 % bolus 1,000 mL (0 mLs Intravenous Stopped 05/27/22 0106)  sodium chloride 0.9 % bolus 1,000 mL (0 mLs Intravenous Stopped 05/27/22 2202)    Mobility walks High fall risk   Focused Assessments    R Recommendations: See Admitting Provider Note  Report given to:   Additional Notes:

## 2022-05-28 ENCOUNTER — Other Ambulatory Visit: Payer: Self-pay

## 2022-05-28 DIAGNOSIS — J9621 Acute and chronic respiratory failure with hypoxia: Secondary | ICD-10-CM | POA: Diagnosis not present

## 2022-05-28 DIAGNOSIS — J101 Influenza due to other identified influenza virus with other respiratory manifestations: Secondary | ICD-10-CM | POA: Diagnosis not present

## 2022-05-28 LAB — BASIC METABOLIC PANEL
Anion gap: 9 (ref 5–15)
BUN: 13 mg/dL (ref 6–20)
CO2: 21 mmol/L — ABNORMAL LOW (ref 22–32)
Calcium: 7.8 mg/dL — ABNORMAL LOW (ref 8.9–10.3)
Chloride: 109 mmol/L (ref 98–111)
Creatinine, Ser: 1.17 mg/dL (ref 0.61–1.24)
GFR, Estimated: >60 mL/min (ref >60)
Glucose, Bld: 99 mg/dL (ref 70–99)
Potassium: 3.9 mmol/L (ref 3.5–5.1)
Sodium: 139 mmol/L (ref 135–145)

## 2022-05-28 LAB — CBC WITH DIFFERENTIAL/PLATELET
Abs Immature Granulocytes: 0.01 10*3/uL (ref 0.00–0.07)
Basophils Absolute: 0 10*3/uL (ref 0.0–0.1)
Basophils Relative: 1 %
Eosinophils Absolute: 0 10*3/uL (ref 0.0–0.5)
Eosinophils Relative: 0 %
HCT: 41 % (ref 39.0–52.0)
Hemoglobin: 13 g/dL (ref 13.0–17.0)
Immature Granulocytes: 0 %
Lymphocytes Relative: 31 %
Lymphs Abs: 1.4 10*3/uL (ref 0.7–4.0)
MCH: 31.6 pg (ref 26.0–34.0)
MCHC: 31.7 g/dL (ref 30.0–36.0)
MCV: 99.8 fL (ref 80.0–100.0)
Monocytes Absolute: 0.5 10*3/uL (ref 0.1–1.0)
Monocytes Relative: 11 %
Neutro Abs: 2.6 10*3/uL (ref 1.7–7.7)
Neutrophils Relative %: 57 %
Platelets: 176 10*3/uL (ref 150–400)
RBC: 4.11 MIL/uL — ABNORMAL LOW (ref 4.22–5.81)
RDW: 13.3 % (ref 11.5–15.5)
WBC: 4.6 10*3/uL (ref 4.0–10.5)
nRBC: 0 % (ref 0.0–0.2)

## 2022-05-28 MED ORDER — OSELTAMIVIR PHOSPHATE 30 MG PO CAPS: 30.0000 mg | ORAL_CAPSULE | Freq: Two times a day (BID) | ORAL | 0 refills | Status: AC

## 2022-05-28 MED ORDER — IPRATROPIUM-ALBUTEROL 0.5-2.5 (3) MG/3ML IN SOLN
3.0000 mL | Freq: Two times a day (BID) | RESPIRATORY_TRACT | Status: DC
  Filled 2022-05-28: qty 3

## 2022-05-28 NOTE — Progress Notes (Signed)
  Transition of Care [facility]) Screening Note   Patient Details  Name: Richard Moon Date of Birth: 12-22-1960   Transition of Care [facility]) CM/SW Contact:    Otelia Santee, LCSW Phone Number: 05/28/2022, 12:44 PM    Transition of Care Department [facility]) has reviewed patient and no TOC needs have been identified at this time. We will continue to monitor patient advancement through interdisciplinary progression rounds. If new patient transition needs arise, please place a TOC consult.

## 2022-05-28 NOTE — Progress Notes (Signed)
SATURATION QUALIFICATIONS: (This note is used to comply with regulatory documentation for home oxygen)  Patient Saturations on Room Air at Rest = 100%  Patient Saturations on Room Air while Ambulating = 100%  Patient Saturations on 0 Liters of oxygen while Ambulating = 100 %  Please briefly explain why patient needs home oxygen: pt has no oxygen needs

## 2022-05-28 NOTE — Progress Notes (Signed)
Mobility Specialist - Progress Note   05/28/22 1252  Mobility  Activity Ambulated with assistance in hallway  Level of Assistance Independent after set-up  Assistive Device None  Distance Ambulated (ft) 250 ft  Activity Response Tolerated well  Mobility Referral Yes  $Mobility charge 1 Mobility   Nurse requested Mobility Specialist to perform oxygen saturation test with pt which includes removing pt from oxygen both at rest and while ambulating.  Below are the results from that testing.     Patient Saturations on Room Air at Rest = spO2 99% Patient Saturations on Room Air while Ambulating = sp02 99% .   At end of testing pt left in room on RA Liters of oxygen.  Reported results to nurse.   Pt received in bed and agreed to mobility, had no c/o pain nor discomfort during session. Pt returned to bed eager to leave with all needs met.   Roderick Pee Mobility Specialist

## 2022-05-28 NOTE — Discharge Summary (Signed)
Physician Discharge Summary   Patient: Richard Moon MRN: [id number] DOB: 09/09/1960  Admit date:     05/26/2022  Discharge date: 05/28/22  Discharge Physician: Briant Cedar   PCP: Patient, No Pcp Per   Recommendations at discharge:   Establish care with a PCP  Discharge Diagnoses: Principal Problem:   Acute respiratory failure with hypoxia (HCC) Active Problems:   MDD (major depressive disorder), severe (HCC)   Influenza A   AKI (acute kidney injury) [facility])    Hospital Course: 61 year old male with no significant past medical history. Pt reported progressive SOB and significant dyspnea for the past few days. Pt reports been extremely exhausted/fatigued and complains of a dry mild cough. Reports fever/chills. In the ER the patient was influenza A positive with hypoxia into the 80s, started on 2 L of oxygen. Patient's Tmax is 101.6.  Patient admitted for further management.    Today, patient reports significant improvement, very eager to be discharged.  Advised to establish care with PCP.  Ambulated the hallway without any issues, with saturations in the 100% on room air.  Assessment and Plan:  Influenza A infection Acute respiratory failure with hypoxia Currently afebrile, with no leukocytosis Influenza A positive Chest x-ray unremarkable Discharged on Tamiflu to complete 5 days   AKI Resolved   Tobacco abuse Advised to quit   History of polysubstance abuse Patient denies any recent use UDS neg/unremarkable     Consultants: None Procedures performed: None Disposition: Home   Diet recommendation:  Discharge Diet Orders (From admission, onward)     Start     Ordered   05/28/22 0000  Diet - low sodium heart healthy        05/28/22 1259           Regular diet   DISCHARGE MEDICATION: Allergies as of 05/28/2022   No Known Allergies      Medication List     STOP taking these medications    hydrOXYzine 25 MG tablet Commonly known as:  ATARAX   traZODone 50 MG tablet Commonly known as: DESYREL       TAKE these medications    oseltamivir 30 MG capsule Commonly known as: TAMIFLU Take 1 capsule (30 mg total) by mouth 2 (two) times daily for 7 doses.        Follow-up Information     [facility]-EMERGENCY DEPT.   Specialty: Emergency Medicine Why: If symptoms worsen Contact information: [address] [phone] [place] [phone]        [facility].   Why: As needed Contact information: [address] [place] [address] [phone]               Discharge Exam: Ceasar Mons Weights   05/27/22 2335  Weight: 67.8 kg   General: NAD  Cardiovascular: S1, S2 present Respiratory: CTAB Abdomen: Soft, nontender, nondistended, bowel sounds present Musculoskeletal: No bilateral pedal edema noted Skin: Normal Psychiatry: Normal mood   Condition at discharge: stable  The results of significant diagnostics from this hospitalization (including imaging, microbiology, ancillary and laboratory) are listed below for reference.   Imaging Studies: DG Chest Port 1 View  Result Date: 05/26/2022 CLINICAL DATA:  A 61 year old male presents with shortness of breath. EXAM: PORTABLE CHEST 1 VIEW COMPARISON:  December 04, 2021 FINDINGS: The heart size and mediastinal contours are within normal limits. Both lungs are clear. The visualized skeletal structures are unremarkable. IMPRESSION: No active  disease. Electronically Signed   By: Donzetta Kohut M.D.   On: 05/26/2022 13:05    Microbiology: Results for orders placed or performed during the hospital encounter of 05/26/22  Resp Panel by RT-PCR (Flu A&B, Covid) Anterior Nasal Swab     Status: Abnormal   Collection Time: 05/26/22  8:12 PM   Specimen: Anterior Nasal Swab  Result Value Ref Range Status   SARS Coronavirus 2 by RT PCR NEGATIVE NEGATIVE  Final    Comment: (NOTE) SARS-CoV-2 target nucleic acids are NOT DETECTED.  The SARS-CoV-2 RNA is generally detectable in upper respiratory specimens during the acute phase of infection. The lowest concentration of SARS-CoV-2 viral copies this assay can detect is 138 copies/mL. A negative result does not preclude SARS-Cov-2 infection and should not be used as the sole basis for treatment or other patient management decisions. A negative result may occur with  improper specimen collection/handling, submission of specimen other than nasopharyngeal swab, presence of viral mutation(s) within the areas targeted by this assay, and inadequate number of viral copies(<138 copies/mL). A negative result must be combined with clinical observations, patient history, and epidemiological information. The expected result is Negative.  Fact Sheet for Patients:  BloggerCourse.com  Fact Sheet for Healthcare Providers:  SeriousBroker.it  This test is no t yet approved or cleared by the [place] FDA and  has been authorized for detection and/or diagnosis of SARS-CoV-2 by FDA under an Emergency Use Authorization (EUA). This EUA will remain  in effect (meaning this test can be used) for the duration of the COVID-19 declaration under Section 564(b)(1) of the Act, 21 U.S.C.section 360bbb-3(b)(1), unless the authorization is terminated  or revoked sooner.       Influenza A by PCR POSITIVE (A) NEGATIVE Final   Influenza B by PCR NEGATIVE NEGATIVE Final    Comment: (NOTE) The Xpert Xpress SARS-CoV-2/FLU/RSV plus assay is intended as an aid in the diagnosis of influenza from Nasopharyngeal swab specimens and should not be used as a sole basis for treatment. Nasal washings and aspirates are unacceptable for Xpert Xpress SARS-CoV-2/FLU/RSV testing.  Fact Sheet for Patients: BloggerCourse.com  Fact Sheet for Healthcare  Providers: SeriousBroker.it  This test is not yet approved or cleared by the [place] FDA and has been authorized for detection and/or diagnosis of SARS-CoV-2 by FDA under an Emergency Use Authorization (EUA). This EUA will remain in effect (meaning this test can be used) for the duration of the COVID-19 declaration under Section 564(b)(1) of the Act, 21 U.S.C. section 360bbb-3(b)(1), unless the authorization is terminated or revoked.  Performed at [facility], [address] [place], [place] [address]     Labs: CBC: Recent Labs  Lab 05/26/22 1422 05/26/22 2318 05/27/22 0425 05/28/22 0556  WBC 8.1 6.1 5.3 4.6  NEUTROABS 6.0  --  3.6 2.6  HGB 16.5 15.9 12.5* 13.0  HCT 49.1 48.3 38.5* 41.0  MCV 97.2 97.0 98.5 99.8  PLT 188 192 157 176   Basic Metabolic Panel: Recent Labs  Lab 05/26/22 1422 05/26/22 2318 05/27/22 0425 05/28/22 0556  NA 136  --  134* 139  K 4.2  --  3.8 3.9  CL 102  --  106 109  CO2 24  --  21* 21*  GLUCOSE 90  --  106* 99  BUN 30*  --  24* 13  CREATININE 1.74* 1.60* 1.43* 1.17  CALCIUM 8.9  --  7.5* 7.8*   Liver Function Tests: No results for input(s): "AST", "ALT", "  ALKPHOS", "BILITOT", "PROT", "ALBUMIN" in the last 168 hours. CBG: No results for input(s): "GLUCAP" in the last 168 hours.  Discharge time spent: greater than 30 minutes.  Signed: Briant Cedar, MD [facility] 05/28/2022

## 2023-02-06 ENCOUNTER — Emergency Department (HOSPITAL_COMMUNITY)
Admission: EM | Admit: 2023-02-06 | Discharge: 2023-02-08 | Disposition: A | Discharge status: Home / Self Care | Payer: MEDICAID | Attending: Emergency Medicine | Admitting: Emergency Medicine

## 2023-02-06 ENCOUNTER — Other Ambulatory Visit: Payer: Self-pay

## 2023-02-06 ENCOUNTER — Encounter (HOSPITAL_COMMUNITY): Payer: Self-pay

## 2023-02-06 DIAGNOSIS — Z79899 Other long term (current) drug therapy: Secondary | ICD-10-CM | POA: Insufficient documentation

## 2023-02-06 DIAGNOSIS — F32A Depression, unspecified: Secondary | ICD-10-CM | POA: Diagnosis present

## 2023-02-06 DIAGNOSIS — F1994 Other psychoactive substance use, unspecified with psychoactive substance-induced mood disorder: Secondary | ICD-10-CM

## 2023-02-06 DIAGNOSIS — F332 Major depressive disorder, recurrent severe without psychotic features: Secondary | ICD-10-CM | POA: Insufficient documentation

## 2023-02-06 DIAGNOSIS — L089 Local infection of the skin and subcutaneous tissue, unspecified: Secondary | ICD-10-CM | POA: Diagnosis not present

## 2023-02-06 DIAGNOSIS — Z59819 Housing instability, housed unspecified: Secondary | ICD-10-CM

## 2023-02-06 DIAGNOSIS — F1424 Cocaine dependence with cocaine-induced mood disorder: Secondary | ICD-10-CM | POA: Diagnosis present

## 2023-02-06 DIAGNOSIS — F1420 Cocaine dependence, uncomplicated: Secondary | ICD-10-CM | POA: Diagnosis not present

## 2023-02-06 DIAGNOSIS — R252 Cramp and spasm: Secondary | ICD-10-CM | POA: Insufficient documentation

## 2023-02-06 DIAGNOSIS — L989 Disorder of the skin and subcutaneous tissue, unspecified: Secondary | ICD-10-CM

## 2023-02-06 DIAGNOSIS — F1910 Other psychoactive substance abuse, uncomplicated: Secondary | ICD-10-CM | POA: Diagnosis not present

## 2023-02-06 DIAGNOSIS — F1410 Cocaine abuse, uncomplicated: Secondary | ICD-10-CM

## 2023-02-06 DIAGNOSIS — F322 Major depressive disorder, single episode, severe without psychotic features: Secondary | ICD-10-CM | POA: Diagnosis present

## 2023-02-06 DIAGNOSIS — R45851 Suicidal ideations: Secondary | ICD-10-CM

## 2023-02-06 LAB — COMPREHENSIVE METABOLIC PANEL
ALT: 28 U/L (ref 0–44)
AST: 54 U/L — ABNORMAL HIGH (ref 15–41)
Albumin: 3.9 g/dL (ref 3.5–5.0)
Alkaline Phosphatase: 62 U/L (ref 38–126)
Anion gap: 12 (ref 5–15)
BUN: 14 mg/dL (ref 6–20)
CO2: 23 mmol/L (ref 22–32)
Calcium: 9.2 mg/dL (ref 8.9–10.3)
Chloride: 104 mmol/L (ref 98–111)
Creatinine, Ser: 1.38 mg/dL — ABNORMAL HIGH (ref 0.61–1.24)
GFR, Estimated: 59 mL/min — ABNORMAL LOW (ref >60)
Glucose, Bld: 82 mg/dL (ref 70–99)
Potassium: 3.9 mmol/L (ref 3.5–5.1)
Sodium: 139 mmol/L (ref 135–145)
Total Bilirubin: 0.7 mg/dL (ref 0.3–1.2)
Total Protein: 6.6 g/dL (ref 6.5–8.1)

## 2023-02-06 LAB — CBC WITH DIFFERENTIAL/PLATELET
Abs Immature Granulocytes: 0 10*3/uL (ref 0.00–0.07)
Basophils Absolute: 0 10*3/uL (ref 0.0–0.1)
Basophils Relative: 0 %
Eosinophils Absolute: 0.3 10*3/uL (ref 0.0–0.5)
Eosinophils Relative: 6 %
HCT: 42.6 % (ref 39.0–52.0)
Hemoglobin: 13.9 g/dL (ref 13.0–17.0)
Immature Granulocytes: 0 %
Lymphocytes Relative: 31 %
Lymphs Abs: 1.6 10*3/uL (ref 0.7–4.0)
MCH: 33 pg (ref 26.0–34.0)
MCHC: 32.6 g/dL (ref 30.0–36.0)
MCV: 101.2 fL — ABNORMAL HIGH (ref 80.0–100.0)
Monocytes Absolute: 0.7 10*3/uL (ref 0.1–1.0)
Monocytes Relative: 13 %
Neutro Abs: 2.7 10*3/uL (ref 1.7–7.7)
Neutrophils Relative %: 50 %
Platelets: 217 10*3/uL (ref 150–400)
RBC: 4.21 MIL/uL — ABNORMAL LOW (ref 4.22–5.81)
RDW: 14.3 % (ref 11.5–15.5)
WBC: 5.3 10*3/uL (ref 4.0–10.5)
nRBC: 0 % (ref 0.0–0.2)

## 2023-02-06 LAB — RAPID URINE DRUG SCREEN, HOSP PERFORMED
Amphetamines: NOT DETECTED
Barbiturates: NOT DETECTED
Benzodiazepines: NOT DETECTED
Cocaine: POSITIVE — AB
Opiates: NOT DETECTED
Tetrahydrocannabinol: NOT DETECTED

## 2023-02-06 LAB — ETHANOL: Alcohol, Ethyl (B): <10 mg/dL (ref <10)

## 2023-02-06 NOTE — ED Provider Notes (Signed)
[name] EMERGENCY DEPARTMENT AT [facility] Provider Note   CSN: [id number] Arrival date & time: 02/06/23  1925     History  Chief Complaint  Patient presents with   Suicidal    Richard Moon is a 62 y.o. male.  62 yo M with a chief complaint of suicidal ideation.  This been ongoing some time but getting much worse over the past few days.  He does not have any specific plan to hurt himself.  He tried to take so much cocaine that it would kill him but did not seem to work.  He does not think that he ingested anything else.  He denies any chest pain or difficulty breathing.  He has been noticing some cramps of both of his legs and had a lesion to the lateral aspect of the left leg for some time.  He said it was seen by some physician who told him not to worry about it.  He feels like it is not changed and maybe is gotten a little bit worse.        Home Medications Prior to Admission medications   Not on File      Allergies    Patient has no known allergies.    Review of Systems   Review of Systems  Physical Exam Updated Vital Signs BP 104/73 (BP Location: Right Arm)   Pulse 70   Temp 98.1 F (36.7 C) (Oral)   Resp 16   Ht 5\' 10"  (1.778 m)   Wt 68 kg   SpO2 100%   BMI 21.52 kg/m  Physical Exam Vitals and nursing note reviewed.  Constitutional:      Appearance: He is well-developed.  HENT:     Head: Normocephalic and atraumatic.  Eyes:     Pupils: Pupils are equal, round, and reactive to light.  Neck:     Vascular: No JVD.  Cardiovascular:     Rate and Rhythm: Normal rate and regular rhythm.     Heart sounds: No murmur heard.    No friction rub. No gallop.  Pulmonary:     Effort: No respiratory distress.     Breath sounds: No wheezing.  Abdominal:     General: There is no distension.     Tenderness: There is no abdominal tenderness. There is no guarding or rebound.  Musculoskeletal:        General: Normal range of motion.     Cervical  back: Normal range of motion and neck supple.     Comments: Keratotic  lesion about the left lateral posterior leg.  No obvious break in the skin.  Intact pulse motor and sensation distally.  Skin:    Coloration: Skin is not pale.     Findings: No rash.  Neurological:     Mental Status: He is alert and oriented to person, place, and time.  Psychiatric:        Behavior: Behavior normal.     ED Results / Procedures / Treatments   Labs (all labs ordered are listed, but only abnormal results are displayed) Labs Reviewed  COMPREHENSIVE METABOLIC PANEL - Abnormal; Notable for the following components:      Result Value   Creatinine, Ser 1.38 (*)    AST 54 (*)    GFR, Estimated 59 (*)    All other components within normal limits  RAPID URINE DRUG SCREEN, HOSP PERFORMED - Abnormal; Notable for the following components:   Cocaine POSITIVE (*)    All  other components within normal limits  CBC WITH DIFFERENTIAL/PLATELET - Abnormal; Notable for the following components:   RBC 4.21 (*)    MCV 101.2 (*)    All other components within normal limits  ETHANOL    EKG EKG Interpretation Date/Time:  Saturday February 06 2023 21:31:17 EDT Ventricular Rate:  71 PR Interval:  132 QRS Duration:  90 QT Interval:  384 QTC Calculation: 417 R Axis:   84  Text Interpretation: Sinus rhythm with frequent Premature ventricular complexes Nonspecific T wave abnormality Abnormal ECG No significant change since last tracing Confirmed by Melene Plan [phone]) on 02/06/2023 10:24:53 PM  Radiology No results found.  Procedures Procedures    Medications Ordered in ED Medications - No data to display  ED Course/ Medical Decision Making/ A&P                                 Medical Decision Making  62 yo M with a chief complaints of suicidal ideation.  The patient tells me that he tried to commit suicide by taking so much cocaine and that it would kill him.  He is now here for help.  He denies any chest  pain or difficulty breathing.  His only medical complaint is that he has had cramping to both of his legs and has chronic.  Skin lesion to his left lower leg.  I discussed having him follow-up with a dermatologist for this as an outpatient for possible biopsy.  He is otherwise well-appearing and nontoxic.  I feel he is medically clear for TTS evaluation.  The patients results and plan were reviewed and discussed.   Any x-rays performed were independently reviewed by myself.   Differential diagnosis were considered with the presenting HPI.  Medications - No data to display  Vitals:   02/06/23 2049 02/06/23 2050  BP: 104/73   Pulse: 70   Resp: 16   Temp: 98.1 F (36.7 C)   TempSrc: Oral   SpO2: 100%   Weight:  68 kg  Height:  5\' 10"  (1.778 m)    Final diagnoses:  Skin lesion  Suicidal ideation            Final Clinical Impression(s) / ED Diagnoses Final diagnoses:  Skin lesion  Suicidal ideation    Rx / DC Orders ED Discharge Orders     None         Melene Plan, DO 02/06/23 2256

## 2023-02-06 NOTE — ED Provider Triage Note (Signed)
Emergency Medicine Provider Triage Evaluation Note  Richard Moon , a 62 y.o. male  was evaluated in triage.  Pt complains of he complains of bilateral leg cramps, sore on the back of his leg.  Patient states he lost his job because he was out getting high.  Patient states that he wants to die and that he tried to kill himself last night by drinking smoking "a bunch of crack.".  Review of Systems  Positive:  Negative:   Physical Exam  BP 104/73 (BP Location: Right Arm)   Pulse 70   Temp 98.1 F (36.7 C) (Oral)   Resp 16   Ht 5\' 10"  (1.778 m)   Wt 68 kg   SpO2 100%   BMI 21.52 kg/m  Gen:   Awake, no distress   Resp:  Normal effort  MSK:   Moves extremities without difficulty  Other:    Medical Decision Making  Medically screening exam initiated at 8:51 PM.  Appropriate orders placed.  Richard Moon was informed that the remainder of the evaluation will be completed by another provider, this initial triage assessment does not replace that evaluation, and the importance of remaining in the ED until their evaluation is complete.     Richard Captain, PA-C 02/06/23 2105

## 2023-02-06 NOTE — Discharge Instructions (Signed)
You need to have your family doctor look at your leg and perhaps refer you to a dermatologist.  If you do not have a family doctor you need to try and establish care with 1.

## 2023-02-06 NOTE — ED Triage Notes (Signed)
Pt reports leg cramping and he has sore on his leg, a venous ulcer present to left posterior lower leg. He reports he is depressed and is having thoughts of hurting himself, states he tried to smoke enough crack last night to kill himself, states "I'm just tired, I wish I was dead. This is a sorry ass life."

## 2023-02-06 NOTE — BH Assessment (Addendum)
Comprehensive Clinical Assessment (CCA) Note  02/07/2023 [name] [id number]  DISPOSITION: Gave clinical report to Cecilio Asper, NP who recommended Pt be observed overnight and evaluated by psychiatry in the morning. Lavena Bullion, [facility], says adult continuous assessment is at capacity. Notified Dr. Melene Plan and Grant Fontana, paramedic of recommendation via secure message.  The patient demonstrates the following risk factors for suicide: Chronic risk factors for suicide include: psychiatric disorder of major depressive disorder, substance use disorder, previous suicide attempts by cutting wrist, and demographic factors (male, >60 y/o). Acute risk factors for suicide include: unemployment, social withdrawal/isolation, and loss (financial, interpersonal, professional). Protective factors for this patient include:  none identified . Considering these factors, the overall suicide risk at this point appears to be high. Patient is not appropriate for outpatient follow up.  Pt is a 62 year old single male who presents unaccompanied to [facility] ED reporting depressive symptoms, suicidal ideation, and cocaine use. Pt says he is "tired of life" and that he attempted suicide last night by trying to overdose on cocaine. Pt's medical record indicates a history of major depressive disorder and polysubstance use. He says he has been "on a long crack binge" and cannot estimate for how long. He reports smoking $50-100 worth of crack daily. He states he smokes 1-2 blunts of marijuana 2-3 times per month. He denies use of other substances.   Pt describes his mood as depressed and acknowledges symptoms including crying spells, social withdrawal, loss of interest in usual pleasures, fatigue, irritability, decreased concentration, decreased sleep, decreased appetite and feelings of guilt, worthlessness and hopelessness. He reports current suicidal ideation with plan to run into traffic. Pt acknowledges that he  has attempted suicide in the past by cutting his wrists. Pt denies any history of intentional self-injurious behaviors. Pt denies current homicidal ideation or history of violence. Pt denies current auditory or visual hallucinations. He says he has episodes of paranoia associated with intoxication.  Pt says he has been staying in "drug houses" and is currently homeless. He says he lost his job and has no Architect. He has been noticing some cramps of both of his legs and had a lesion to the lateral aspect of the left leg for some time. He cannot identify any family or friends who are supportive. He denies history of abuse. He denies current legal problems. He denies access to firearms.  Pt says he has no mental health providers. He has been prescribed psychiatric medications in the past and cannot remember when he last took medication. He acknowledges his last psychiatric hospitalization was in June 2023 at [facility].  Pt is dressed in hospital scrubs, drowsy, and oriented x4. Pt speaks in a clear tone, at moderate volume and normal pace. Motor behavior appears normal. Eye contact is fair. Pt's mood is depressed and affect is congruent with mood. Thought process is coherent and relevant. There is no indication he is currently responding to internal stimuli or experiencing delusional thought content. He is cooperative. He cannot contract for safety tonight should he leave the hospital. He says he will sign voluntarily into a psychiatric facility.  Chief Complaint:  Chief Complaint  Patient presents with   Suicidal   Visit Diagnosis:  F33.2 Major depressive disorder, Recurrent episode, Severe F14.20 Cocaine use disorder, Severe   CCA Screening, Triage and Referral (STR)  Patient Reported Information How did you hear about [place]? Self  What Is the Reason for Your Visit/Call Today? Pt has history of major depressive  disorder and substance use. He reports depressive symptoms and using crack  daily. He endorses current suicidal ideation with plan to run into traffic.  How Long Has This Been Causing You Problems? 1 wk - 1 month  What Do You Feel Would Help You the Most Today? Alcohol or Drug Use Treatment; Treatment for Depression or other mood problem; Medication(s)   Have You Recently Had Any Thoughts About Hurting Yourself? Yes  Are You Planning to Commit Suicide/Harm Yourself At This time? Yes   Flowsheet Row ED from 02/06/2023 in [facility] ED to Hosp-Admission (Discharged) from 05/26/2022 in [facility] Admission (Discharged) [facility]  C-SSRS RISK CATEGORY High Risk No Risk High Risk       Have you Recently Had Thoughts About Hurting Someone Karolee Ohs? No  Are You Planning to Harm Someone at This Time? No  Explanation: Pt reports current suicidal ideation with plan to walk into traffic. He denies current homicidal ideation.   Have You Used Any Alcohol or Drugs in the Past 24 Hours? Yes  What Did You Use and How Much? Pt reports smoking approximately $50 worth of crack in the past 24 hours.   Do You Currently Have a Therapist/Psychiatrist? No  Name of Therapist/Psychiatrist: Name of Therapist/Psychiatrist: Pt has no mental health providers   Have You Been Recently Discharged From Any Office Practice or Programs? No  Explanation of Discharge From Practice/Program: Pt has not been recently discharged from a practice     CCA Screening Triage Referral Assessment Type of Contact: Tele-Assessment  Telemedicine Service Delivery: Telemedicine service delivery: This service was provided via telemedicine using a 2-way, interactive audio and video technology  Is this Initial or Reassessment? Is this Initial or Reassessment?: Initial Assessment  Date Telepsych consult ordered in [facility]:  Date Telepsych consult ordered in [facility]:  02/06/23  Time Telepsych consult ordered in [facility]:  Time Telepsych consult ordered in [facility]: 2253  Location of Assessment: [facility] ED  Provider Location: [facility] Assessment Services   Collateral Involvement: None   Does Patient Have a Automotive engineer Guardian? No  Legal Guardian Contact Information: Pt does not have a legal guardian  Copy of Legal Guardianship Form: -- (Pt does not have a legal guardian)  Legal Guardian Notified of Arrival: -- (Pt does not have a legal guardian)  Legal Guardian Notified of Pending Discharge: -- (Pt does not have a legal guardian)  If Minor and Not Living with Parent(s), Who has Custody? Pt is an adult  Is CPS involved or ever been involved? Never  Is APS involved or ever been involved? Never   Patient Determined To Be At Risk for Harm To Self or Others Based on Review of Patient Reported Information or Presenting Complaint? Yes, for Self-Harm (Pt reports current suicidal ideation with plan to walk into traffic. He denies current homicidal ideation.)  Method: Plan with intent and identified person (Pt reports current suicidal ideation with plan to walk into traffic. He denies current homicidal ideation.)  Availability of Means: Has close by (Pt reports current suicidal ideation with plan to walk into traffic. He denies current homicidal ideation.)  Intent: Clearly intends on inflicting harm that could cause death (Pt reports current suicidal ideation with plan to walk into traffic. He denies current homicidal ideation.)  Notification Required: No need or identified person  Additional Information for Danger to Others Potential: -- (Pt  denies history of aggression)  Additional Comments for Danger to Others Potential: Pt denies history of aggression  Are There Guns or Other Weapons in Your Home? No  Types of Guns/Weapons: Pt denies access to firearms  Are These Weapons Safely Secured?                            -- (Pt denies access to  firearms)  Who Could Verify You Are Able To Have These Secured: Pt denies access to firearms  Do You Have any Outstanding Charges, Pending Court Dates, Parole/Probation? Pt denies current legal problems  Contacted To Inform of Risk of Harm To Self or Others: Unable to Contact:    Does Patient Present under Involuntary Commitment? No    [place] of Residence: [place]   Patient Currently Receiving the Following Services: Not Receiving Services   Determination of Need: Emergent (2 hours)   Options For Referral: Inpatient Hospitalization; Medication Management; Outpatient Therapy; BH Urgent Care     CCA Biopsychosocial Patient Reported Schizophrenia/Schizoaffective Diagnosis in Past: No   Strengths: Pt is motivated for treatment.   Mental Health Symptoms Depression:   Change in energy/activity; Difficulty Concentrating; Fatigue; Hopelessness; Increase/decrease in appetite; Irritability; Worthlessness; Tearfulness; Sleep (too much or little)   Duration of Depressive symptoms:  Duration of Depressive Symptoms: Greater than two weeks   Mania:   None   Anxiety:    Worrying; Tension; Sleep; Restlessness; Irritability; Fatigue; Difficulty concentrating   Psychosis:   None   Duration of Psychotic symptoms:    Trauma:   None   Obsessions:   None   Compulsions:   None   Inattention:   None   Hyperactivity/Impulsivity:   None   Oppositional/Defiant Behaviors:   None   Emotional Irregularity:   None   Other Mood/Personality Symptoms:   None noted    Mental Status Exam Appearance and self-care  Stature:   Average   Weight:   Thin   Clothing:   -- (Scrubs)   Grooming:   Normal   Cosmetic use:   None   Posture/gait:   Normal   Motor activity:   Not Remarkable   Sensorium  Attention:   Normal   Concentration:   Normal   Orientation:   X5   Recall/memory:   Normal   Affect and Mood  Affect:   Appropriate   Mood:    Depressed   Relating  Eye contact:   Normal   Facial expression:   Responsive   Attitude toward examiner:   Cooperative   Thought and Language  Speech flow:  Normal   Thought content:   Appropriate to Mood and Circumstances   Preoccupation:   None   Hallucinations:   None   Organization:   Coherent   [employer] of Knowledge:   Average   Intelligence:   Average   Abstraction:   Normal   Judgement:   Fair   Dance movement psychotherapist:   Adequate   Insight:   Lacking   Decision Making:   Normal   Social Functioning  Social Maturity:   Isolates   Social Judgement:   "Street Smart"   Stress  Stressors:   Housing; Illness; Financial; Work   Coping Ability:   Contractor Deficits:   Responsibility   Supports:   Support needed     Religion: Religion/Spirituality Are You A Religious Person?: No How Might This Affect Treatment?: NA  Leisure/Recreation: Leisure / Recreation Do You Have Hobbies?: No  Exercise/Diet: Exercise/Diet Do You Exercise?: No Have You Gained or Lost A Significant Amount of Weight in the Past Six Months?: No Do You Follow a Special Diet?: No Do You Have Any Trouble Sleeping?: Yes Explanation of Sleeping Difficulties: Pt reports poor sleep   CCA Employment/Education Employment/Work Situation: Employment / Work Situation Employment Situation: Unemployed Patient's Job has Been Impacted by Current Illness: Yes Describe how Patient's Job has Been Impacted: Pt says he lost his job Has Patient ever Been in Equities trader?: No  Education: Education Is Patient Currently Attending School?: No Last Grade Completed: 10 Did You Product manager?: No Did You Have An Individualized Education Program (IIEP): No Did You Have Any Difficulty At School?: No Patient's Education Has Been Impacted by Current Illness: No   CCA Family/Childhood History Family and Relationship History: Family history Marital  status: Single Does patient have children?: Yes How many children?: 1 How is patient's relationship with their children?: One daughter. Son died by suicide  Childhood History:  Childhood History By whom was/is the patient raised?: Both parents Did patient suffer any verbal/emotional/physical/sexual abuse as a child?: No Did patient suffer from severe childhood neglect?: No Has patient ever been sexually abused/assaulted/raped as an adolescent or adult?: No Was the patient ever a victim of a crime or a disaster?: No Witnessed domestic violence?: No Has patient been affected by domestic violence as an adult?: No       CCA Substance Use Alcohol/Drug Use: Alcohol / Drug Use Pain Medications: Denies abuse Prescriptions: Denies abuse Over the Counter: Denies abuse History of alcohol / drug use?: Yes Longest period of sobriety (when/how long): 3 years Negative Consequences of Use: Surveyor, quantity, Work / School Withdrawal Symptoms: None Substance #1 Name of Substance 1: Cocaine (crack) 1 - Age of First Use: 32 1 - Amount (size/oz): $50-100 worth 1 - Frequency: Daily 1 - Duration: Ongoing for years 1 - Last Use / Amount: 02/06/2023, $50 worth 1 - Method of Aquiring: Drug dealer 1- Route of Use: Smoke inhalation Substance #2 Name of Substance 2: Marijuana 2 - Age of First Use: Adolescent 2 - Amount (size/oz): 1-2 blunts 2 - Frequency: 2-3 times per month 2 - Duration: Ongoing for years 2 - Last Use / Amount: Unknown 2 - Method of Aquiring: Drug dealer 2 - Route of Substance Use: Smoke inhalation                     ASAM's:  Six Dimensions of Multidimensional Assessment  Dimension 1:  Acute Intoxication and/or Withdrawal Potential:   Dimension 1:  Description of individual's past and current experiences of substance use and withdrawal: Pt reports dailt crack use  Dimension 2:  Biomedical Conditions and Complications:   Dimension 2:  Description of patient's biomedical  conditions and  complications: Keratotic  lesion about the left lateral posterior le  Dimension 3:  Emotional, Behavioral, or Cognitive Conditions and Complications:  Dimension 3:  Description of emotional, behavioral, or cognitive conditions and complications: Pt reports depressive symptoms and current suicidal ideation  Dimension 4:  Readiness to Change:  Dimension 4:  Description of Readiness to Change criteria: Pt says he wants substance abuse treatment  Dimension 5:  Relapse, Continued use, or Continued Problem Potential:  Dimension 5:  Relapse, continued use, or continued problem potential critiera description: Pt has history of multiple relapses  Dimension 6:  Recovery/Living Environment:  Dimension 6:  Recovery/Iiving environment criteria description:  Pt is currently homeless  ASAM Severity Score: ASAM's Severity Rating Score: 12  ASAM Recommended Level of Treatment: ASAM Recommended Level of Treatment: Level III Residential Treatment   Substance use Disorder (SUD) Substance Use Disorder (SUD)  Checklist Symptoms of Substance Use: Continued use despite having a persistent/recurrent physical/psychological problem caused/exacerbated by use, Continued use despite persistent or recurrent social, interpersonal problems, caused or exacerbated by use, Large amounts of time spent to obtain, use or recover from the substance(s), Persistent desire or unsuccessful efforts to cut down or control use, Presence of craving or strong urge to use, Recurrent use that results in a failure to fulfill major role obligations (work, school, home), Repeated use in physically hazardous situations, Social, occupational, recreational activities given up or reduced due to use, Substance(s) often taken in larger amounts or over longer times than was intended  Recommendations for Services/Supports/Treatments: Recommendations for Services/Supports/Treatments Recommendations For Services/Supports/Treatments: Residential-Level  1  Discharge Disposition: Discharge Disposition Medical Exam completed: Yes  DSM5 Diagnoses: Patient Active Problem List   Diagnosis Date Noted   Acute on chronic respiratory failure with hypoxia (HCC) 05/26/2022   Influenza A 05/26/2022   AKI (acute kidney injury) (HCC) 05/26/2022   Acute respiratory failure with hypoxemia (HCC) 05/26/2022   MDD (major depressive disorder) 12/04/2021   Substance induced mood disorder (HCC) 04/01/2021   Polysubstance abuse (HCC) 01/28/2021   Suicidal ideations 01/28/2021   Suicidal ideation    Cocaine-induced mood disorder (HCC)    Cocaine dependence with cocaine-induced mood disorder (HCC)    MDD (major depressive disorder), severe (HCC) 03/12/2018     Referrals to Alternative Service(s): Referred to Alternative Service(s):   Place:   Date:   Time:    Referred to Alternative Service(s):   Place:   Date:   Time:    Referred to Alternative Service(s):   Place:   Date:   Time:    Referred to Alternative Service(s):   Place:   Date:   Time:     Pamalee Leyden, [facility]

## 2023-02-07 ENCOUNTER — Encounter (HOSPITAL_COMMUNITY): Payer: Self-pay | Admitting: Emergency Medicine

## 2023-02-07 ENCOUNTER — Other Ambulatory Visit: Payer: Self-pay

## 2023-02-07 DIAGNOSIS — F1424 Cocaine dependence with cocaine-induced mood disorder: Secondary | ICD-10-CM

## 2023-02-07 MED ORDER — HYDROXYZINE HCL 25 MG PO TABS
25.0000 mg | ORAL_TABLET | Freq: Three times a day (TID) | ORAL | Status: DC
  Administered 2023-02-07 – 2023-02-08 (×3): 25 mg via ORAL
  Filled 2023-02-07 (×3): qty 1

## 2023-02-07 MED ORDER — TRAZODONE HCL 50 MG PO TABS
50.0000 mg | ORAL_TABLET | Freq: Every evening | ORAL | Status: DC | PRN
  Administered 2023-02-07: 50 mg via ORAL
  Filled 2023-02-07: qty 1

## 2023-02-07 MED ORDER — SERTRALINE HCL 50 MG PO TABS
50.0000 mg | ORAL_TABLET | Freq: Every day | ORAL | Status: AC
  Administered 2023-02-07 – 2023-02-08 (×2): 50 mg via ORAL
  Filled 2023-02-07 (×2): qty 1

## 2023-02-07 NOTE — ED Notes (Signed)
Pt moved to room for TTS consult. Ambulated with slight gait disturbance.

## 2023-02-07 NOTE — ED Notes (Signed)
Message from TTS staff:  TTS completed. Cecilio Asper, NP recommends Pt be observed and evaluated by psychiatry in the morning. [facility] is at capacity for adults.

## 2023-02-07 NOTE — Progress Notes (Signed)
Patient has been denied by [facility] due to no appropriate beds available. Patient meets [facility] inpatient criteria per Ophelia Shoulder, NP. Patient has been faxed out to the following facilities:   [facility]  [address] [place] [place] [address] [phone]  [facility]  [address] [place] [address] [phone]  [facility]  [address] [place] [address] [phone]  [facility]  [address], [place] [address] [phone]  [facility]  [address], [place] [address] [phone]  [facility]  [address], [place] [address] [phone]  [facility] Patient Placement  [facility], [place] [phone]  [facility]  [address] [place], [place] [address] [phone]  [facility]  [address] [place], [place] [address] [phone]  [facility]  [address] [place] [address] [phone]  [facility]  [address] [place] [address] [phone]  [facility]  [address], [place] [address] [phone]  [facility] [address]  [address], [place] [address] [phone]  [facility]  [address] [place] [address] [phone]   Damita Dunnings, MSW, LCSW-A  12:15 PM 02/07/2023

## 2023-02-07 NOTE — Consult Note (Signed)
Telepsych Consultation   Reason for Consult:  "Psych Consult"  Referring Physician:  Dr. Melene Plan Location of Patient:    [facility] Location of Provider: Other: virtual home office  Patient Identification: Richard Moon MRN:  [id number] Principal Diagnosis: Cocaine dependence with cocaine-induced mood disorder (HCC) Diagnosis:  Principal Problem:   Cocaine dependence with cocaine-induced mood disorder (HCC) Active Problems:   MDD (major depressive disorder), severe (HCC)   Polysubstance abuse (HCC)   Total Time spent with patient: 30 minutes  Subjective:   Richard Moon is a 62 y.o. male patient admitted with  Per RNTriage Note dated 02/06/2023@2048 : "Pt reports leg cramping and he has sore on his leg, a venous ulcer present to left posterior lower leg. He reports he is depressed and is having thoughts of hurting himself, states he tried to smoke enough crack last night to kill himself, states "I'm just tired, I wish I was dead. This is a sorry ass life."   HPI:   Richard Moon, 62 y.o., male patient presented to .  Patient seen via telepsych by this provider; chart reviewed and consulted with Dr. Jannifer Franklin on 02/07/23.  On evaluation Richard Moon is seen sitting in triage room, fairly groomed, wearing hospital scrubs.  He is partially alert oriented to person, place and reason for visit but not sure of the date and time.   He state he's here because he's suicidal and tried to overdose on crack cocaine one day prior to admission.  Patient reports being triggered for chronic drug usage and states he's frustrated because he cannot seem to kick the habit.  He endorses feelings of hopelessness, worthlessness, despair and states he has nothing to live for. Patient states she has not family or friends who he can reach out to for support.     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  Pt states,"I can afford dope but I cannot afford medication.  That makes no sense."    He reports prior hx for suicide attempt, in which he reports cutting his wrists.  He also reports several attempts at sobriety but usually relapses by returning to same environment.    Labs:  UDS is positive for cocaine 384/417 no prolonged QT intervals  During evaluation Richard Moon is seated on a chair in an triage room; He is alert/oriented x 4; anxious, depressed, hopeless and worthless but cooperative; and mood congruent with affect.  Patient is speaking in a clear tone at moderate volume, and normal pace; with good eye contact.  His thought process is goal oriented; There is no indication that he is currently responding to internal/external stimuli or experiencing delusional thought content.  Patient endorses suicidal thoughts and states he tried to overdose on drugs one day prior to admission.  He denies homicidal ideation, psychosis, and paranoia.  Patient has remained cooperative throughout assessment and has answered questions appropriately.    Per ED Provider Admission Assessment 02/06/2023: Chief Complaint  Patient presents with   Suicidal      Richard Moon is a 62 y.o. male.   62 yo M with a chief complaint of suicidal ideation.  This been ongoing some time but getting much worse over the past few days.  He does not have any specific plan to hurt himself.  He tried to take so much cocaine that  it would kill him but did not seem to work.  He does not think that he ingested anything else.  He denies any chest pain or difficulty breathing.  He has been noticing some cramps of both of his legs and had a lesion to the lateral aspect of the left leg for some time.  He said it was seen by some physician who told him not to worry about it.  He feels like it is not changed and maybe is gotten a little bit worse.   Past Psychiatric History: as  outlined below  Risk to Self:  yes Risk to Others:  denies Prior Inpatient Therapy:  yes, one year ago at [facility] Prior Outpatient Therapy:  denies follow up  Past Medical History: History reviewed. No pertinent past medical history.  Past Surgical History:  Procedure Laterality Date   HERNIA REPAIR     Family History: History reviewed. No pertinent family history. Family Psychiatric  History: denies Social History:  Social History   Substance and Sexual Activity  Alcohol Use Yes   Comment: Occasional alcohol use     Social History   Substance and Sexual Activity  Drug Use Yes   Types: Marijuana, Cocaine, Heroin   Comment: daily cocaine use (reported $200-300/ daily); occasional cannabis use    Social History   Socioeconomic History   Marital status: Single    Spouse name: Not on file   Number of children: Not on file   Years of education: Not on file   Highest education level: Not on file  Occupational History   Not on file  Tobacco Use   Smoking status: Some Days    Current packs/day: 0.25    Average packs/day: 0.3 packs/day for 20.0 years (5.0 ttl pk-yrs)    Types: Cigarettes   Smokeless tobacco: Never  Vaping Use   Vaping status: Every Day  Substance and Sexual Activity   Alcohol use: Yes    Comment: Occasional alcohol use   Drug use: Yes    Types: Marijuana, Cocaine, Heroin    Comment: daily cocaine use (reported $200-300/ daily); occasional cannabis use   Sexual activity: Yes    Birth control/protection: Condom  Other Topics Concern   Not on file  Social History Narrative   Not on file   Social Determinants of Health   Financial Resource Strain: Not on file  Food Insecurity: No Food Insecurity (05/28/2022)   Hunger Vital Sign    Worried About Running Out of Food in the Last Year: Never true    Ran Out of Food in the Last Year: Never true  Transportation Needs: No Transportation Needs (05/28/2022)   PRAPARE - Administrator, Civil Service  (Medical): No    Lack of Transportation (Non-Medical): No  Physical Activity: Not on file  Stress: Not on file  Social Connections: Not on file   Additional Social History:    Allergies:  No Known Allergies  Labs:  Results for orders placed or performed during the hospital encounter of 02/06/23 (from the past 48 hour(s))  Comprehensive metabolic panel     Status: Abnormal   Collection Time: 02/06/23  9:10 PM  Result Value Ref Range   Sodium 139 135 - 145 mmol/L   Potassium 3.9 3.5 - 5.1 mmol/L   Chloride 104 98 - 111 mmol/L   CO2 23 22 - 32 mmol/L   Glucose, Bld 82 70 - 99 mg/dL    Comment: Glucose reference range applies only to  samples taken after fasting for at least 8 hours.   BUN 14 6 - 20 mg/dL   Creatinine, Ser 4.09 (H) 0.61 - 1.24 mg/dL   Calcium 9.2 8.9 - 81.1 mg/dL   Total Protein 6.6 6.5 - 8.1 g/dL   Albumin 3.9 3.5 - 5.0 g/dL   AST 54 (H) 15 - 41 U/L   ALT 28 0 - 44 U/L   Alkaline Phosphatase 62 38 - 126 U/L   Total Bilirubin 0.7 0.3 - 1.2 mg/dL   GFR, Estimated 59 (L) >60 mL/min    Comment: (NOTE) Calculated using the CKD-EPI Creatinine Equation (2021)    Anion gap 12 5 - 15    Comment: Performed at [facility], [address], [place], [place] [address]  Ethanol     Status: None   Collection Time: 02/06/23  9:10 PM  Result Value Ref Range   Alcohol, Ethyl (B) <10 <10 mg/dL    Comment: (NOTE) Lowest detectable limit for serum alcohol is 10 mg/dL.  For medical purposes only. Performed at [facility], [address] [place], [place] [address]   Urine rapid drug screen (hosp performed)     Status: Abnormal   Collection Time: 02/06/23  9:10 PM  Result Value Ref Range   Opiates NONE DETECTED NONE DETECTED   Cocaine POSITIVE (A) NONE DETECTED   Benzodiazepines NONE DETECTED NONE DETECTED   Amphetamines NONE DETECTED NONE DETECTED   Tetrahydrocannabinol NONE DETECTED NONE DETECTED   Barbiturates NONE DETECTED NONE DETECTED    Comment:  (NOTE) DRUG SCREEN FOR MEDICAL PURPOSES ONLY.  IF CONFIRMATION IS NEEDED FOR ANY PURPOSE, NOTIFY LAB WITHIN 5 DAYS.  LOWEST DETECTABLE LIMITS FOR URINE DRUG SCREEN Drug Class                     Cutoff (ng/mL) Amphetamine and metabolites    1000 Barbiturate and metabolites    200 Benzodiazepine                 200 Opiates and metabolites        300 Cocaine and metabolites        300 THC                            50 Performed at [facility], [address] [place], [place] [address]   CBC with Diff     Status: Abnormal   Collection Time: 02/06/23  9:10 PM  Result Value Ref Range   WBC 5.3 4.0 - 10.5 K/uL   RBC 4.21 (L) 4.22 - 5.81 MIL/uL   Hemoglobin 13.9 13.0 - 17.0 g/dL   HCT 57.8 46.9 - 62.9 %   MCV 101.2 (H) 80.0 - 100.0 fL   MCH 33.0 26.0 - 34.0 pg   MCHC 32.6 30.0 - 36.0 g/dL   RDW 52.8 41.3 - 24.4 %   Platelets 217 150 - 400 K/uL   nRBC 0.0 0.0 - 0.2 %   Neutrophils Relative % 50 %   Neutro Abs 2.7 1.7 - 7.7 K/uL   Lymphocytes Relative 31 %   Lymphs Abs 1.6 0.7 - 4.0 K/uL   Monocytes Relative 13 %   Monocytes Absolute 0.7 0.1 - 1.0 K/uL   Eosinophils Relative 6 %   Eosinophils Absolute 0.3 0.0 - 0.5 K/uL   Basophils Relative 0 %   Basophils Absolute 0.0 0.0 - 0.1 K/uL  Immature Granulocytes 0 %   Abs Immature Granulocytes 0.00 0.00 - 0.07 K/uL    Comment: Performed at [facility], [address], [place], [place] [address]    Medications:  Current Facility-Administered Medications  Medication Dose Route Frequency Provider Last Rate Last Admin   hydrOXYzine (ATARAX) tablet 25 mg  25 mg Oral TID Ophelia Shoulder E, NP   25 mg at 02/07/23 1459   sertraline (ZOLOFT) tablet 50 mg  50 mg Oral Daily Ophelia Shoulder E, NP   50 mg at 02/07/23 1458   traZODone (DESYREL) tablet 50 mg  50 mg Oral QHS PRN Chales Abrahams, NP       No current outpatient medications on file.    Musculoskeletal: pt moves all extremiites and ambulates independently Strength &  Muscle Tone: within normal limits Gait & Station: normal Patient leans: N/A     Psychiatric Specialty Exam:  Presentation  General Appearance: Fairly Groomed  Eye Contact:Fair  Speech:Normal Rate  Speech Volume:Normal  Handedness:Right   Mood and Affect  Mood:Anxious; Depressed; Dysphoric; Hopeless; Worthless  Affect:Congruent; Blunt   Thought Process  Thought Processes:Goal Directed  Descriptions of Associations:Intact  Orientation:Full (Time, Place and Person)  Thought Content:Illogical; Rumination  History of Schizophrenia/Schizoaffective disorder:No  Duration of Psychotic Symptoms:No data recorded Hallucinations:Hallucinations: None  Ideas of Reference:None  Suicidal Thoughts:Suicidal Thoughts: Yes, Active SI Active Intent and/or Plan: With Intent; With Plan; With Means to Carry Out; With Access to Means  Homicidal Thoughts:Homicidal Thoughts: No   Sensorium  Memory:Immediate Good; Recent Good  Judgment:Poor  Insight:Poor   Executive Functions  Concentration:Fair  Attention Span:Fair  Recall:Good  Fund of Knowledge:Fair  Language:Good   Psychomotor Activity  Psychomotor Activity:Psychomotor Activity: Normal   Assets  Assets:Communication Skills; Desire for Improvement   Sleep  Sleep:Sleep: Poor Number of Hours of Sleep: 3    Physical Exam: Physical Exam Cardiovascular:     Rate and Rhythm: Normal rate.     Pulses: Normal pulses.  Pulmonary:     Effort: Pulmonary effort is normal.  Musculoskeletal:        General: Normal range of motion.     Cervical back: Normal range of motion.  Neurological:     Mental Status: He is alert and oriented to person, place, and time.  Psychiatric:        Attention and Perception: Attention and perception normal.        Mood and Affect: Mood is anxious and depressed. Affect is blunt.        Speech: Speech normal.        Behavior: Behavior is agitated. Behavior is cooperative.         Thought Content: Thought content is not paranoid or delusional. Thought content includes suicidal ideation. Thought content does not include homicidal ideation. Thought content includes suicidal plan. Thought content does not include homicidal plan.        Cognition and Memory: Cognition normal.        Judgment: Judgment is impulsive.    Review of Systems  Constitutional: Negative.   HENT: Negative.    Eyes: Negative.   Respiratory: Negative.    Cardiovascular: Negative.   Gastrointestinal: Negative.   Genitourinary: Negative.   Musculoskeletal: Negative.   Skin: Negative.   Neurological: Negative.   Endo/Heme/Allergies: Negative.   Psychiatric/Behavioral:  Positive for depression, substance abuse and suicidal ideas. The patient is nervous/anxious.    Blood pressure 112/79, pulse 67, temperature 98.1 F (36.7 C), resp. rate 17, height  5\' 10"  (1.778 m), weight 68 kg, SpO2 97%. Body mass index is 21.52 kg/m.  Treatment Plan Summary: Mr.Richard Moon is a 62 year old African American male with pmhx of Depression, and Cocaine abuse, who voluntarily presents for worsening suicidal ideations following cocaine use.  He is known to our facility and [facility] facility for similar presentation, last occurrence was one year prior when he was admitted to [facility] with SI and plans to walk into traffic.   On assessment today,he continues to endorse suicidal ideation but not plan today; however, he did state prior to admission tried to overdose on crack cocaine.  Patient is homeless, lists no protective factors against self harm and has a hx for unmanaged MDD.  His presentation is complicated by crack cocaine usage which contributes to disinhibition and makes him more impulsive.  [facility] reviewed him for possible admission; but he was declined by Dr. Alfonse Flavors who felt he had too many risk factors for suicide completion.  Considering above, he is referred for inpatient admission where he can be restarted on psych meds and  monitored for safety and mood stability.   Dx: cocaine induced mood disorder with depressed mood.  Daily contact with patient to assess and evaluate symptoms and progress in treatment and Medication management  Recommend restarting previous medications: Sertraline 50mg  po daily for depression/mood Trazodone 50mg  po at bedtime prn sleep Hydroxyzine 25mg  po TID prn anxiety  Disposition: Patient is medically cleared.  He is referred for inpatient psychiatric admission but remains in the emergency department awaiting a bed.    Dr. Denton Lank ED Provider; Delorise Royals, RN, Blima Dessert, NT Damita Dunnings, LCSW dispositions and Rosey Bath, [facility] were all informed of above recommendation and disposition via secure chat @0923am  and also at 1150am  This service was provided via telemedicine using a 2-way, interactive audio and video technology.  Names of all persons participating in this telemedicine service and their role in this encounter. Name: Richard Moon Role: Patient  Name: Ophelia Shoulder Role: PMHNP  Name: Thedore Mins Role: Psychiatrist   Chales Abrahams, NP 02/07/2023 4:54 PM

## 2023-02-07 NOTE — ED Notes (Signed)
Patient's belongings placed in locker 4 x 3 bags, valuables envelope including 2 x credit cards left with security.

## 2023-02-07 NOTE — ED Provider Notes (Signed)
Emergency Medicine Observation Re-evaluation Note  Richard Moon is a 62 y.o. male, seen on rounds today.  Pt initially presented to the ED for complaints of substance use and related mood disorder Pt indicates is feeling better this AM, no new c/o. Pt reports normal appetite.   Physical Exam  BP 112/79 (BP Location: Right Arm)   Pulse 67   Temp 98.1 F (36.7 C)   Resp 17   Ht 1.778 m (5\' 10" )   Wt 68 kg   SpO2 97%   BMI 21.52 kg/m  Physical Exam General: resting, easily aroused.  Cardiac: regular rate.  Lungs: breathing comfortably. Psych: normal mood and affect. Pt does not appear acutely depressed or despondent. Indicates frustrated with housing instability and ongoing substance use. No plan or desire to harm self or others. Pt does not appear to be responding to internal stimuli - no hallucinations or acute psychosis noted.   ED Course / MDM    I have reviewed the labs performed to date as well as medications administered while in observation.  Recent changes in the last 24 hours include ED obs, reassessment.   Plan  Pt reassessed. Currently no acute psychosis or plan/desire to harm self.   Pt does acknowledge stress/difficulty with ongoing homelessness and SUD.  Will provide patient resource guide in terms of accessing shelter, social services, and substance use disorder treatment.  Will also provide [facility] as resource for [facility] f/u as well as in event of future BH issues/crisis.   Vitals normal, no distress, eating/drinking. Pt currently appears stable for ED discharge.   Rec outpt bh and pcp f/u.  Return precautions provided.     Cathren Laine, MD 02/07/23 1022

## 2023-02-08 ENCOUNTER — Encounter (HOSPITAL_COMMUNITY): Payer: Self-pay | Admitting: Adult Health

## 2023-02-08 ENCOUNTER — Inpatient Hospital Stay (HOSPITAL_COMMUNITY)
Admission: AD | Admit: 2023-02-08 | Discharge: 2023-02-16 | DRG: 897 | Disposition: A | Discharge status: Home / Self Care | Payer: MEDICAID | Source: Intra-hospital | Attending: Psychiatry | Admitting: Psychiatry

## 2023-02-08 ENCOUNTER — Other Ambulatory Visit: Payer: Self-pay

## 2023-02-08 DIAGNOSIS — F1494 Cocaine use, unspecified with cocaine-induced mood disorder: Secondary | ICD-10-CM | POA: Diagnosis not present

## 2023-02-08 DIAGNOSIS — Z9151 Personal history of suicidal behavior: Secondary | ICD-10-CM

## 2023-02-08 DIAGNOSIS — R45851 Suicidal ideations: Secondary | ICD-10-CM | POA: Diagnosis present

## 2023-02-08 DIAGNOSIS — Z811 Family history of alcohol abuse and dependence: Secondary | ICD-10-CM

## 2023-02-08 DIAGNOSIS — Z79899 Other long term (current) drug therapy: Secondary | ICD-10-CM | POA: Diagnosis not present

## 2023-02-08 DIAGNOSIS — F32A Depression, unspecified: Secondary | ICD-10-CM | POA: Diagnosis present

## 2023-02-08 DIAGNOSIS — F332 Major depressive disorder, recurrent severe without psychotic features: Secondary | ICD-10-CM | POA: Diagnosis not present

## 2023-02-08 DIAGNOSIS — Z6372 Alcoholism and drug addiction in family: Secondary | ICD-10-CM

## 2023-02-08 DIAGNOSIS — Z634 Disappearance and death of family member: Secondary | ICD-10-CM | POA: Diagnosis not present

## 2023-02-08 DIAGNOSIS — R441 Visual hallucinations: Secondary | ICD-10-CM | POA: Diagnosis not present

## 2023-02-08 DIAGNOSIS — R451 Restlessness and agitation: Secondary | ICD-10-CM | POA: Diagnosis not present

## 2023-02-08 DIAGNOSIS — Z818 Family history of other mental and behavioral disorders: Secondary | ICD-10-CM | POA: Diagnosis not present

## 2023-02-08 DIAGNOSIS — G4700 Insomnia, unspecified: Secondary | ICD-10-CM | POA: Diagnosis present

## 2023-02-08 DIAGNOSIS — Z560 Unemployment, unspecified: Secondary | ICD-10-CM | POA: Diagnosis not present

## 2023-02-08 DIAGNOSIS — F419 Anxiety disorder, unspecified: Secondary | ICD-10-CM | POA: Diagnosis present

## 2023-02-08 DIAGNOSIS — Z91148 Patient's other noncompliance with medication regimen for other reason: Secondary | ICD-10-CM

## 2023-02-08 DIAGNOSIS — F17210 Nicotine dependence, cigarettes, uncomplicated: Secondary | ICD-10-CM | POA: Diagnosis present

## 2023-02-08 DIAGNOSIS — R519 Headache, unspecified: Secondary | ICD-10-CM | POA: Diagnosis not present

## 2023-02-08 MED ORDER — DIPHENHYDRAMINE HCL 50 MG/ML IJ SOLN: 50.0000 mg | Freq: Three times a day (TID) | INTRAMUSCULAR | Status: DC | PRN

## 2023-02-08 MED ORDER — HALOPERIDOL 5 MG PO TABS
5.0000 mg | ORAL_TABLET | Freq: Three times a day (TID) | ORAL | Status: DC | PRN
  Administered 2023-02-12: 5 mg via ORAL
  Filled 2023-02-08: qty 1

## 2023-02-08 MED ORDER — DIPHENHYDRAMINE HCL 25 MG PO CAPS
50.0000 mg | ORAL_CAPSULE | Freq: Three times a day (TID) | ORAL | Status: DC | PRN
  Administered 2023-02-12: 50 mg via ORAL
  Filled 2023-02-08: qty 2

## 2023-02-08 MED ORDER — ALUM & MAG HYDROXIDE-SIMETH 200-200-20 MG/5ML PO SUSP
30.0000 mL | ORAL | Status: DC | PRN
  Administered 2023-02-11 (×2): 30 mL via ORAL
  Filled 2023-02-08 (×2): qty 30

## 2023-02-08 MED ORDER — ACETAMINOPHEN 325 MG PO TABS
650.0000 mg | ORAL_TABLET | Freq: Four times a day (QID) | ORAL | Status: DC | PRN
  Administered 2023-02-09 – 2023-02-14 (×4): 650 mg via ORAL
  Filled 2023-02-08 (×5): qty 2

## 2023-02-08 MED ORDER — TRAZODONE HCL 50 MG PO TABS
50.0000 mg | ORAL_TABLET | Freq: Every evening | ORAL | Status: DC | PRN
  Administered 2023-02-08 – 2023-02-15 (×7): 50 mg via ORAL
  Filled 2023-02-08 (×7): qty 1
  Filled 2023-02-08: qty 7
  Filled 2023-02-08: qty 1
  Filled 2023-02-08: qty 14

## 2023-02-08 MED ORDER — HALOPERIDOL LACTATE 5 MG/ML IJ SOLN: 5.0000 mg | Freq: Three times a day (TID) | INTRAMUSCULAR | Status: DC | PRN

## 2023-02-08 MED ORDER — HYDROXYZINE HCL 25 MG PO TABS
25.0000 mg | ORAL_TABLET | Freq: Three times a day (TID) | ORAL | Status: DC
  Administered 2023-02-08 – 2023-02-15 (×17): 25 mg via ORAL
  Filled 2023-02-08: qty 1
  Filled 2023-02-08 (×4): qty 10
  Filled 2023-02-08 (×2): qty 1
  Filled 2023-02-08: qty 10
  Filled 2023-02-08: qty 1
  Filled 2023-02-08 (×2): qty 10
  Filled 2023-02-08: qty 1
  Filled 2023-02-08 (×2): qty 10
  Filled 2023-02-08: qty 1
  Filled 2023-02-08 (×2): qty 10
  Filled 2023-02-08: qty 1
  Filled 2023-02-08 (×2): qty 10
  Filled 2023-02-08 (×2): qty 1
  Filled 2023-02-08: qty 10
  Filled 2023-02-08 (×4): qty 1
  Filled 2023-02-08: qty 10
  Filled 2023-02-08 (×3): qty 1

## 2023-02-08 MED ORDER — LORAZEPAM 2 MG/ML IJ SOLN: 2.0000 mg | Freq: Three times a day (TID) | INTRAMUSCULAR | Status: DC | PRN

## 2023-02-08 MED ORDER — MAGNESIUM HYDROXIDE 400 MG/5ML PO SUSP: 30.0000 mL | Freq: Every day | ORAL | Status: DC | PRN

## 2023-02-08 MED ORDER — LORAZEPAM 1 MG PO TABS: 2.0000 mg | ORAL_TABLET | Freq: Three times a day (TID) | ORAL | Status: DC | PRN

## 2023-02-08 NOTE — Tx Team (Signed)
Initial Treatment Plan 02/08/2023 1:08 PM [name] BJY:[id number]    PATIENT STRESSORS: Financial difficulties   Legal issue   Substance abuse     PATIENT STRENGTHS: Ability for insight  Communication skills  Motivation for treatment/growth    PATIENT IDENTIFIED PROBLEMS: "Getting my life back together"  "Getting clean from drugs"  Substance Abuse  Suicidal ideation  Depression  Anxiety  Homelessness         DISCHARGE CRITERIA:  Ability to meet basic life and health needs Motivation to continue treatment in a less acute level of care  PRELIMINARY DISCHARGE PLAN: Attend aftercare/continuing care group Outpatient therapy Return to previous living arrangement  PATIENT/FAMILY INVOLVEMENT: This treatment plan has been presented to and reviewed with the patient, Richard Moon, and/or family member.  The patient and family have been given the opportunity to ask questions and make suggestions.  Audrie Lia Lin Glazier, RN 02/08/2023, 1:08 PM

## 2023-02-08 NOTE — BHH Group Notes (Signed)
Adult Psychoeducational Group Note  Date:  02/08/2023 Time:  9:03 PM  Group Topic/Focus:  Wrap-Up Group:   The focus of this group is to help patients review their daily goal of treatment and discuss progress on daily workbooks.  Participation Level:  Did Not Attend  Christ Kick 02/08/2023, 9:03 PM

## 2023-02-08 NOTE — Group Note (Unsigned)
Date:  02/08/2023 Time:  6:22 PM  Group Topic/Focus:  Goals Group:   The focus of this group is to help patients establish daily goals to achieve during treatment and discuss how the patient can incorporate goal setting into their daily lives to aide in recovery.     Participation Level:  {BHH PARTICIPATION UJWJX:91478}  Participation Quality:  {BHH PARTICIPATION QUALITY:22265}  Affect:  {BHH AFFECT:22266}  Cognitive:  {BHH COGNITIVE:22267}  Insight: {BHH Insight2:20797}  Engagement in Group:  {BHH ENGAGEMENT IN GNFAO:13086}  Modes of Intervention:  {BHH MODES OF INTERVENTION:22269}  Additional Comments:  ***  Richard Moon 02/08/2023, 6:22 PM

## 2023-02-08 NOTE — ED Provider Notes (Signed)
Emergency Medicine Observation Re-evaluation Note  Richard Moon is a 62 y.o. male, seen on rounds today.  Pt initially presented to the ED for complaints of substance use and related mood disorder Currently, the patient is awaiting [facility] admit.  Physical Exam  BP 120/72 (BP Location: Left Arm)   Pulse 62   Temp 98.5 F (36.9 C) (Oral)   Resp 16   Ht 5\' 10"  (1.778 m)   Wt 68 kg   SpO2 98%   BMI 21.52 kg/m  Physical Exam General: Calm Cardiac: well perfused Lungs: even respirations Psych: Calm  ED Course / MDM  EKG:EKG Interpretation Date/Time:  Saturday February 06 2023 21:31:17 EDT Ventricular Rate:  71 PR Interval:  132 QRS Duration:  90 QT Interval:  384 QTC Calculation: 417 R Axis:   84  Text Interpretation: Sinus rhythm with frequent Premature ventricular complexes Nonspecific T wave abnormality Abnormal ECG No significant change since last tracing Confirmed by Melene Plan [phone]) on 02/06/2023 10:24:53 PM  I have reviewed the labs performed to date as well as medications administered while in observation.  Recent changes in the last 24 hours include [facility] admit.  Plan  Current plan is for admit to [facility].    Maia Plan, MD 02/08/23 1056

## 2023-02-08 NOTE — ED Notes (Signed)
Report called at 1000 am to Lucile Crater, RN at [facility]

## 2023-02-08 NOTE — Progress Notes (Signed)
Admission Note: Patient is a 62 year old male admitted to the unit voluntarily from [facility] for substance abuse and suicidal ideation with a plan to overdose on Cocaine.  Patient currently denies suicidal ideation but stated he is here to get his life back together and to get clean from drugs.  Patient UDS positive for Cocaine.  Patient is alert and oriented x 4.  Presents with a blunted affect and depressed mood.  Admission plan of care reviewed with consent for treatment signed.  Skin and personal belongings searched completed.  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  No contraband found.  Patient oriented to the unit, staff and room.  Verbalizes understanding of unit rules/protocols.  Patient is safe on the unit.

## 2023-02-08 NOTE — Plan of Care (Signed)

## 2023-02-09 DIAGNOSIS — F1494 Cocaine use, unspecified with cocaine-induced mood disorder: Secondary | ICD-10-CM

## 2023-02-09 LAB — LIPID PANEL
Cholesterol: 174 mg/dL (ref 0–200)
HDL: 79 mg/dL (ref >40)
LDL Cholesterol: 65 mg/dL (ref 0–99)
Total CHOL/HDL Ratio: 2.2 RATIO
Triglycerides: 148 mg/dL (ref <150)
VLDL: 30 mg/dL (ref 0–40)

## 2023-02-09 LAB — HEMOGLOBIN A1C
Hgb A1c MFr Bld: 6 % — ABNORMAL HIGH (ref 4.8–5.6)
Mean Plasma Glucose: 125.5 mg/dL

## 2023-02-09 MED ORDER — SERTRALINE HCL 25 MG PO TABS
25.0000 mg | ORAL_TABLET | Freq: Every day | ORAL | Status: DC
  Administered 2023-02-09 – 2023-02-10 (×2): 25 mg via ORAL
  Filled 2023-02-09 (×5): qty 1

## 2023-02-09 NOTE — [facility] Counselor (Signed)
Adult Comprehensive Assessment  Patient ID: Richard Moon, male   DOB: 1961/05/16, 62 y.o.   MRN: [id number]  Information Source: Information source: Patient  Current Stressors:  Patient states their primary concerns and needs for treatment are:: Depression and substance abuse Patient states their goals for this hospitilization and ongoing recovery are:: Patient would like to find housing or residential treatment facility Educational / Learning stressors: none reported Employment / Job issues: Lost job about 2 weeks ago Family Relationships: Estranged from most of his family due to substance abuse Surveyor, quantity / Lack of resources (include bankruptcy): No income Housing / Lack of housing: No home, lost place to stay Physical health (include injuries & life threatening diseases): none reported Social relationships: none reported Substance abuse: Crack cocaine Bereavement / Loss: Son and mom passed in 2012  Living/Environment/Situation:  Living Arrangements: Alone Living conditions (as described by patient or guardian): currently homeless What is atmosphere in current home: Dangerous  Family History:  Marital status: Single How many children?: 1 How is patient's relationship with their children?: One daughter. Son died by suicide  Childhood History:  By whom was/is the patient raised?: Both parents Additional childhood history information: Father was an alcoholic Description of patient's relationship with caregiver when they were a child: Patient states that he was always closest to his mother, but he loved his father How were you disciplined when you got in trouble as a child/adolescent?: reports being physically reprimanded WNL Does patient have siblings?: Yes Number of Siblings: 5 Description of patient's current relationship with siblings: Does not talk to them Did patient suffer any verbal/emotional/physical/sexual abuse as a child?: No Did patient suffer from severe childhood  neglect?: No Has patient ever been sexually abused/assaulted/raped as an adolescent or adult?: No Was the patient ever a victim of a crime or a disaster?: No Witnessed domestic violence?: Yes ("Just a few times") Has patient been affected by domestic violence as an adult?: No  Education:  Highest grade of school patient has completed: 12th Currently a student?: No Learning disability?: No  Employment/Work Situation:   Employment Situation: Unemployed Patient's Job has Been Impacted by Current Illness: Yes Describe how Patient's Job has Been Impacted: Pt says he lost his job What is the Longest Time Patient has Held a Job?: 4 years Where was the Patient Employed at that Time?: [employer], maintenence Has Patient ever Been in the [employer]?: No  Financial Resources:   Financial resources: No income, Medicaid Does patient have a Lawyer or guardian?: No  Alcohol/Substance Abuse:   What has been your use of drugs/alcohol within the last 12 months?: Crack cocaine a few days ago If attempted suicide, did drugs/alcohol play a role in this?: No Alcohol/Substance Abuse Treatment Hx: Past Tx, Inpatient If yes, describe treatment: patient states he was in [facility] 1 year ago Has alcohol/substance abuse ever caused legal problems?: No  Social Support System:   Forensic psychologist System: None Type of faith/religion: none  Leisure/Recreation:   Do You Have Hobbies?: No  Strengths/Needs:   What is the patient's perception of their strengths?: 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 Patient states these barriers may affect/interfere with their treatment: Not having a place to live or way to stop using drugs, patient is afraid he may do something illegal to get drugs Patient states these barriers may affect their return to the community: Not having a home, car, or money  Discharge Plan:   Currently receiving community mental health services: No Patient  states concerns and preferences for aftercare planning are: Patient would like to go to a long term residential treatment facility Does patient have access to transportation?: No Does patient have financial barriers related to discharge medications?: Yes (Patient has medicaid but may not afford copay) Patient description of barriers related to discharge medications: Patient may not be able to afford his copays Plan for no access to transportation at discharge: taxi voucher Will patient be returning to same living situation after discharge?: Yes  Summary/Recommendations:   Summary and Recommendations (to be completed by the evaluator): [name] 62 year old male admitted on 02/08/23 to [facility] for  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 Patient currently denies SI/HI/AVH, and would like to be admitted into a residential treatment facility for cocaine abuse.Patient does not have any family support and currently is homeless.  Patient will need transprotation at discharge. While here, [facility] can benefit from crisis stabilization, medication management, therapeutic milieu, and referrals for services.  Richard Moon. 02/09/2023

## 2023-02-09 NOTE — Progress Notes (Signed)
   02/08/23 2153  Psych Admission Type (Psych Patients Only)  Admission Status Voluntary  Psychosocial Assessment  Patient Complaints Depression;Substance abuse  Eye Contact Brief  Facial Expression Flat  Affect Blunted  Speech Logical/coherent  Interaction Assertive;Minimal  Motor Activity Other (Comment) (WDL)  Appearance/Hygiene In scrubs  Behavior Characteristics Cooperative;Calm  Mood Depressed  Thought Process  Coherency WDL  Content WDL  Delusions None reported or observed  Perception WDL  Hallucination Visual  Judgment Impaired  Confusion None  Danger to Self  Current suicidal ideation? Denies  Agreement Not to Harm Self Yes  Description of Agreement verbal  Danger to Others  Danger to Others None reported or observed

## 2023-02-09 NOTE — Progress Notes (Signed)
   02/09/23 1610  15 Minute Checks  Location Bedroom  Visual Appearance Calm  Behavior Sleeping  Sleep (Behavioral Health Patients Only)  Calculate sleep? (Click Yes once per 24 hr at 0600 safety check) Yes  Documented sleep last 24 hours 8.25

## 2023-02-09 NOTE — Group Note (Signed)
Recreation Therapy Group Note   Group Topic:Animal Assisted Therapy   Group Date: 02/09/2023 Start Time: 0945 End Time: 1030 Facilitators: Eliyahu Bille-McCall, LRT,CTRS Location: [address] Dayroom   Animal-Assisted Activity (AAA) Program Checklist/Progress Notes Patient Eligibility Criteria Checklist & Daily Group note for Rec Tx Intervention  AAA/T Program Assumption of Risk Form signed by Patient/ or Parent Legal Guardian Yes  Patient is free of allergies or severe asthma Yes  Patient reports no fear of animals Yes  Patient reports no history of cruelty to animals Yes  Patient understands his/her participation is voluntary Yes  Patient washes hands before animal contact Yes  Patient washes hands after animal contact Yes   Affect/Mood: Appropriate   Participation Level: Engaged   Participation Quality: Independent   Behavior: Appropriate    Clinical Observations/Individualized Feedback: Patient attended session and interacted appropriately with therapy dog and peers. Patient asked appropriate questions about therapy dog and his training. Patient shared stories about their pets at home with group.     Plan: Continue to engage patient in RT group sessions 2-3x/week.   Darielle Hancher-McCall, LRT,CTRS  02/09/2023 1:41 PM

## 2023-02-09 NOTE — [facility] Group Notes (Signed)

## 2023-02-09 NOTE — Plan of Care (Signed)
  Problem: Education: Goal: Knowledge of [facility] Education information/materials will improve Outcome: Progressing Goal: Emotional status will improve Outcome: Progressing Goal: Mental status will improve Outcome: Progressing Goal: Verbalization of understanding the information provided will improve Outcome: Progressing   Problem: Activity: Goal: Interest or engagement in activities will improve Outcome: Progressing   

## 2023-02-09 NOTE — Plan of Care (Addendum)
  Problem: Safety: Goal: Periods of time without injury will increase Outcome: Progressing   Problem: Coping: Goal: Will verbalize feelings Outcome: Progressing

## 2023-02-09 NOTE — H&P (Cosign Needed Addendum)
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 I lost my 60 years old son 5 years ago due to committing suicide, and lost my job 1 week ago and I lost my accommodation 1 week ago."  History of Present Illness: Richard Moon is a 62 year old AA male with prior psychiatric hx significant for major depressive disorder & stimulant (cocaine) use disorder. He is known in this [facility] & the [facility] for complain of worsening symptoms of depression, drug use & or suicidal ideations. Chart review reports indicated that patient has been seen at the other psychiatric hospitals within the surrounding areas with similar complaints. He is admitted to the [facility] this time around with complaint of suicidal ideations with plan to over dose on crack cocaine that did not completely work. His UDS was positive for cocaine. After medical evaluation/stabilization & clearance, he was transferred to the [facility] for further psychiatric evaluation & treatments. During this evaluation, Richard Moon reports:  " 1 week ago and was overwhelmed with life stressors.  My 60 year old son died of suicide 5 years ago, lost my job 1 week ago working for myself and I lost my accommodation 1 week ago.  I decided to overdose on my $200 crack cocaine, but it did not take my life.  So I decided to go to the emergency room to get some help.  I have had some help before in drug rehab treatment, in [place], [place] and other places that I do not seem to be able to kick the habit.  I do not have any support system family of good friends.  I feel real  frustrated at this time and I do not care if I am alive or not.  Patient endorses feeling hopeless, worthless, despair, decreased sleep and guilty.  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  Able to maintain fair eye contact with this provider.  Speech is clear and coherent with normal volume and pattern.  Present with depressed and anxious mood and affect is congruent.  He is cooperative and able to answer assessment questions appropriately however, occasionally does not remember the year that he attended some rehab for treatment.  Thought process is coherent and relevant with goal to attend another drug treatment rehabilitation.  Denies SI, HI, or AVH.  Reporting that he is safe here at the facility.  Patient is currently admitted for mood stabilization, safety and medication management.  UDS positive for cocaine.  Vital signs within normal limits with pulse of 71 but later dropped to 37, nursing staff to recheck pulse rate.  Patient assessment, treatment plan and lab testing initiated with the treatment team and consult with the attending psychiatrist.  Mode of transport to Hospital:Safe Transport Current Outpatient (Home) Medication Listing: See home medication listing PRN medication prior to evaluation: See home medication Listing  ED course: Labs and EKG were obtained and analyzed.  Patient disposition for further psychiatric evaluation and treatment at [facility] Collateral Information: Not obtained at this time  POA/Legal Guardian: Patient is his own legal guardian  Past Psychiatric Hx: Previous Psych Diagnoses: MDD, stimulant use disorder Prior inpatient treatment: Multiple psychiatric inpatient treatment Current/prior outpatient treatment: Denies Prior rehab hx: Yes multiple times Psychotherapy hx: Yes History of  suicide: Yes, multiple times.  Recently attempted to overdose on crack cocaine. History of homicide or aggression: Denies Psychiatric medication history: Yes,, trial of medication on Zoloft and hydroxyzine Psychiatric medication compliance history: Noncompliance Neuromodulation history: Denies Current Psychiatrist: Denies Current therapist: Denies  Substance Abuse Hx: Alcohol: Drinks 1-2 beers per week Tobacco: 1 pack of cigarettes daily Illicit drugs: $200 worth of crack cocaine daily Rx drug abuse: Yes Rehab hx: Yes  Past Medical History: Medical Diagnoses: Acute and chronic respiratory failure Home Rx: Yes Prior Hosp: Yes Prior Surgeries/Trauma: Denies Head trauma, LOC, concussions, seizures: Denies Allergies: No known drug allergies LMP: Not applicable Contraception: Not applicable PCP: Denies  Family History: Medical: Diabetes, cancer, heart disease Psych: Uncle diagnosed with schizophrenia Psych Rx: Yes SA/HA: Son committed suicide Substance use family hx: Father is an alcoholic  Social History: Childhood (bring, raised, lives now, parents, siblings, schooling, education): High school diploma and trade school Abuse: Denies Marital Status: Single Sexual orientation: Male from birth Children: 2 children, 1 died 5 years ago Employment: Currently unemployed Peer Group: Denies Housing: Lives in a crack house Finances: Water quality scientist: Denied Scientist, physiological: Denies serving in the [employer]  Associated Signs/Symptoms: Depression Symptoms:  depressed mood, anhedonia, insomnia, fatigue, feelings of worthlessness/guilt, difficulty concentrating, anxiety, loss of energy/fatigue, disturbed sleep, weight loss, increased appetite, (Hypo) Manic Symptoms:  Distractibility, Impulsivity, Irritable Mood, Labiality of Mood, Anxiety Symptoms:  Excessive Worry, Psychotic Symptoms:   Patient denies psychotic symptoms PTSD Symptoms: NA denies PTSD  symptoms  Total Time spent with patient: 1 hour  Is the patient at risk to self? Yes.    Has the patient been a risk to self in the past 6 months? Yes.    Has the patient been a risk to self within the distant past? Yes.    Is the patient a risk to others? No.  Has the patient been a risk to others in the past 6 months? No.  Has the patient been a risk to others within the distant past? No.   [place] Scale:  Flowsheet Row [facility] ([facility]) [facility] from 02/06/2023 in [facility] Department at [facility] ED to Hosp-Admission (Discharged) from 05/26/2022 in [facility]  C-SSRS RISK CATEGORY Low Risk High Risk No Risk      Alcohol Screening: 1. How often do you have a drink containing alcohol?: 2 to 4 times a month 2. How many drinks containing alcohol do you have on a typical day when you are drinking?: 1 or 2 3. How often do you have six or more drinks on one occasion?: Never AUDIT-C Score: 2 Alcohol Brief Interventions/Follow-up: Patient Refused Substance Abuse History in the last 12 months:  Yes.   Consequences of Substance Abuse: NA Previous Psychotropic Medications: Yes  Psychological Evaluations: Yes  Past Medical History: History reviewed. No pertinent past medical history.  Past Surgical History:  Procedure Laterality Date   HERNIA REPAIR     Family History: History reviewed. No pertinent family history.  Tobacco Screening:  Social History   Tobacco Use  Smoking Status Some Days   Current packs/day: 0.25   Average packs/day: 0.3 packs/day for 20.0 years (  5.0 ttl pk-yrs)   Types: Cigarettes  Smokeless Tobacco Never    BH Tobacco Counseling     Are you interested in Tobacco Cessation Medications?  No, patient refused Counseled patient on smoking cessation:  Refused/Declined practical counseling Reason Tobacco Screening Not Completed: Patient Refused  Screening   Social History:  Social History   Substance and Sexual Activity  Alcohol Use Yes   Comment: Occasional alcohol use     Social History   Substance and Sexual Activity  Drug Use Yes   Types: Marijuana, Cocaine, Heroin   Comment: daily cocaine use (reported $200-300/ daily); occasional cannabis use    Additional Social History: Marital status: Single How many children?: 1 How is patient's relationship with their children?: One daughter. Son died by suicide     Allergies:  No Known Allergies  Lab Results: No results found for this or any previous visit (from the past 48 hour(s)).  Blood Alcohol level:  Lab Results  Component Value Date   ETH <10 02/06/2023   ETH <10 12/04/2021    Metabolic Disorder Labs:  Lab Results  Component Value Date   HGBA1C 5.8 (H) 04/02/2021   MPG 119.76 04/02/2021   No results found for: "PROLACTIN" Lab Results  Component Value Date   CHOL 190 04/02/2021   TRIG 66 04/02/2021   HDL 78 04/02/2021   CHOLHDL 2.4 04/02/2021   VLDL 13 04/02/2021   LDLCALC 99 04/02/2021   Current Medications: Current Facility-Administered Medications  Medication Dose Route Frequency Provider Last Rate Last Admin   acetaminophen (TYLENOL) tablet 650 mg  650 mg Oral Q6H PRN Massengill, Harrold Donath, MD   650 mg at 02/09/23 1341   alum & mag hydroxide-simeth (MAALOX/MYLANTA) 200-200-20 MG/5ML suspension 30 mL  30 mL Oral Q4H PRN Massengill, Harrold Donath, MD       diphenhydrAMINE (BENADRYL) capsule 50 mg  50 mg Oral TID PRN Massengill, Harrold Donath, MD       Or   diphenhydrAMINE (BENADRYL) injection 50 mg  50 mg Intramuscular TID PRN Massengill, Harrold Donath, MD       haloperidol (HALDOL) tablet 5 mg  5 mg Oral TID PRN Phineas Inches, MD       Or   haloperidol lactate (HALDOL) injection 5 mg  5 mg Intramuscular TID PRN Massengill, Harrold Donath, MD       hydrOXYzine (ATARAX) tablet 25 mg  25 mg Oral TID Phineas Inches, MD   25 mg at 02/09/23 1248   LORazepam (ATIVAN) tablet  2 mg  2 mg Oral TID PRN Phineas Inches, MD       Or   LORazepam (ATIVAN) injection 2 mg  2 mg Intramuscular TID PRN Massengill, Harrold Donath, MD       magnesium hydroxide (MILK OF MAGNESIA) suspension 30 mL  30 mL Oral Daily PRN Massengill, Harrold Donath, MD       traZODone (DESYREL) tablet 50 mg  50 mg Oral QHS PRN Phineas Inches, MD   50 mg at 02/08/23 2153   PTA Medications: No medications prior to admission.   Musculoskeletal: Strength & Muscle Tone: within normal limits Gait & Station: normal Patient leans: N/A  Psychiatric Specialty Exam:  Presentation  General Appearance:  Casual  Eye Contact: Fair  Speech: Normal Rate  Speech Volume: Normal  Handedness: Right  Mood and Affect  Mood: Anxious; Depressed; Worthless  Affect: Congruent; Blunt  Thought Process  Thought Processes: Coherent; Goal Directed  Duration of Psychotic Symptoms:N/A Past Diagnosis of Schizophrenia or Psychoactive disorder: No  Descriptions  of Associations:Intact  Orientation:Full (Time, Place and Person)  Thought Content:Rumination  Hallucinations:Hallucinations: None  Ideas of Reference:None  Suicidal Thoughts:Suicidal Thoughts: No SI Active Intent and/or Plan: -- (Denies)  Homicidal Thoughts:Homicidal Thoughts: No  Sensorium  Memory: Immediate Fair; Recent Fair  Judgment: Poor  Insight: Poor   Executive Functions  Concentration: Fair  Attention Span: Fair  Recall: [employer] of Knowledge: Fair  Language: Fair  Psychomotor Activity  Psychomotor Activity: Psychomotor Activity: Normal  Assets  Assets: Communication Skills; Desire for Improvement; Physical Health; Resilience  Sleep  Sleep: Sleep: Good Number of Hours of Sleep: 8.25  Physical Exam: Physical Exam Vitals and nursing note reviewed.  HENT:     Head: Normocephalic.     Nose: Nose normal.     Mouth/Throat:     Mouth: Mucous membranes are moist.  Eyes:     Pupils: Pupils are equal,  round, and reactive to light.  Cardiovascular:     Rate and Rhythm: Bradycardia present.  Pulmonary:     Effort: Pulmonary effort is normal.  Abdominal:     Comments: Deferred  Genitourinary:    Comments: Deferred Musculoskeletal:        General: Normal range of motion.     Cervical back: Normal range of motion.  Skin:    General: Skin is warm.  Neurological:     Mental Status: He is alert and oriented to person, place, and time.     Comments: Occasionally forgetful  Psychiatric:        Mood and Affect: Mood normal.        Behavior: Behavior normal.    Review of Systems  Constitutional:  Negative for chills and fever.  HENT:  Negative for sore throat.   Eyes:  Negative for blurred vision.  Respiratory:  Negative for cough, shortness of breath and wheezing.   Cardiovascular:  Negative for chest pain and palpitations.  Gastrointestinal:  Negative for heartburn, nausea and vomiting.  Genitourinary: Negative.   Musculoskeletal: Negative.   Skin:  Negative for itching and rash.       Left posterior lower leg with scaling dry skin lesion without drainage  Neurological:  Negative for dizziness, tingling, tremors and headaches.  Endo/Heme/Allergies:        See allergy listing  Psychiatric/Behavioral:  Positive for depression and substance abuse. The patient is nervous/anxious and has insomnia.    Blood pressure 132/86, pulse (!) 37, temperature 97.8 F (36.6 C), temperature source Oral, resp. rate 20, height 5\' 10"  (1.778 m), weight 62.1 kg, SpO2 98%. Body mass index is 19.66 kg/m.  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 daily for depression and anxiety --Continue hydroxyzine 25 mg p.o. 3  times daily as needed for anxiety --Continue trazodone 50 mg p.o. q. nightly as needed for insomnia  Agitation protocol: Benadryl capsule 50 mg p.o. or IM 3 times daily as needed agitation   Haldol tablets 5 mg po IM 3 times daily as needed agitation   Lorazepam tablet 2 mg p.o. or IM 3 times daily as needed agitation    Other PRN Medications -Acetaminophen 650 mg every 6 as needed/mild pain -Maalox 30 mL oral every 4 as needed/digestion -Magnesium hydroxide 30 mL daily as needed/mild constipation  -- The risks/benefits/side-effects/alternatives to this  medication were discussed in detail with the patient and time was given for questions. The patient consents to medication trial.              -- Encouraged patient to participate  in unit milieu and in scheduled group therapies   Labs: CMP: Creatinine 1.38 high, AST 54 high, estimated GFR 59 high.  CBC with differential: RBC 4.21 low, MCV 101.2 high.  UDS: Positive for cocaine.  Labs ordered: Lipid panel, hemoglobin A1c.  Awaiting results at this time  EKG: Sinus rhythm with frequent premature ventricular complexes, ventricular rate 71, QT/QTc 384/417  Safety and Monitoring: Voluntary admission to inpatient psychiatric unit for safety, stabilization and treatment Daily contact with patient to assess and evaluate symptoms and progress in treatment Patient's case to be discussed in multi-disciplinary team meeting Observation Level : q15 minute checks Vital signs: q12 hours Precautions: suicide, but pt currently verbally contracts for safety on unit    Discharge Planning: Social work and case management to assist with discharge planning and identification of hospital follow-up needs prior to discharge Estimated LOS: 5-7 days Discharge Concerns: Need to establish a safety plan; Medication compliance and effectiveness Discharge Goals: Return home with outpatient referrals for mental health follow-up including medication  management/psychotherapy.  Long Term Goal(s): Improvement in symptoms so as ready for discharge  Short Term Goals: Ability to identify changes in lifestyle to reduce recurrence of condition will improve, Ability to verbalize feelings will improve, Ability to disclose and discuss suicidal ideas, Ability to demonstrate self-control will improve, Ability to identify and develop effective coping behaviors will improve, Ability to maintain clinical measurements within normal limits will improve, Compliance with prescribed medications will improve, and Ability to identify triggers associated with substance abuse/mental health issues will improve  Physician Treatment Plan for Secondary Diagnosis: Principal Problem:   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    Cecilie Lowers, FNP 8/20/20243:42 PM

## 2023-02-09 NOTE — [facility] Suicide Risk Assessment (Cosign Needed Addendum)
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 He is known in this [facility] & the [facility] for complain of worsening symptoms of depression, drug use & or suicidal ideations. Chart review reports indicated that patient has been seen at the other psychiatric hospitals within the surrounding areas with similar complaints. He is admitted to the [facility] this time around with complaint of suicidal ideations with plan to over dose on crack cocaine that did not completely work. His UDS was positive for cocaine. After medical evaluation/stabilization & clearance, he was transferred to the [facility] for further psychiatric evaluation & treatments.   Continued Clinical Symptoms:    The "Alcohol Use Disorders Identification Test", Guidelines for Use in Primary Care, Second Edition.  World Science writer [facility]). Score between 0-7:  no or low risk or alcohol related problems. Score between 8-15:  moderate risk of alcohol related problems. Score between 16-19:  high risk of alcohol related problems. Score 20 or above:  warrants further diagnostic evaluation for alcohol dependence and treatment.  CLINICAL FACTORS:   Depression:   Anhedonia Comorbid alcohol abuse/dependence Hopelessness Impulsivity Insomnia Severe Alcohol/Substance  Abuse/Dependencies More than one psychiatric diagnosis Previous Psychiatric Diagnoses and Treatments Medical Diagnoses and Treatments/Surgeries  Musculoskeletal: Strength & Muscle Tone: within normal limits Gait & Station: normal Patient leans: N/A  Psychiatric Specialty Exam:  Presentation  General Appearance:  Casual  Eye Contact: Fair  Speech: Normal Rate  Speech Volume: Normal  Handedness: Right  Mood and Affect  Mood: Anxious; Depressed; Worthless  Affect: Congruent; Blunt  Thought Process  Thought Processes: Coherent; Goal Directed  Descriptions of Associations:Intact  Orientation:Full (Time, Place and Person)  Thought Content:Rumination  History of Schizophrenia/Schizoaffective disorder:No  Duration of Psychotic Symptoms:No data recorded Hallucinations:Hallucinations: None  Ideas of Reference:None  Suicidal Thoughts:Suicidal Thoughts: No SI Active Intent and/or Plan: -- (Denies)  Homicidal Thoughts:Homicidal Thoughts: No  Sensorium  Memory: Immediate Fair; Recent Fair  Judgment: Poor  Insight: Poor  Executive Functions  Concentration: Fair  Attention Span: Fair  Recall: [employer] of Knowledge: Fair  Language: Fair  Psychomotor Activity  Psychomotor Activity: Psychomotor Activity: Normal  Assets  Assets: Communication Skills; Desire for Improvement; Physical Health; Resilience  Sleep  Sleep: Sleep: Good Number of Hours of Sleep: 8.25  Physical Exam: Physical Exam Vitals and nursing note reviewed.  HENT:     Head: Normocephalic.     Nose: Nose normal.     Mouth/Throat:     Mouth: Mucous membranes are moist.  Eyes:     Pupils: Pupils are equal, round, and reactive to light.  Cardiovascular:     Rate and Rhythm: Bradycardia present.  Pulmonary:     Effort: Pulmonary effort is normal.  Abdominal:     Comments: Deferred  Genitourinary:    Comments: Deferred Musculoskeletal:        General: Normal  range of motion.     Cervical  back: Normal range of motion.  Skin:    General: Skin is warm.  Neurological:     Mental Status: He is alert and oriented to person, place, and time.     Comments: Occasionally forgetful  Psychiatric:        Behavior: Behavior normal.    Review of Systems  Constitutional:  Negative for chills and fever.  HENT:  Negative for sore throat.   Eyes:  Negative for blurred vision.  Respiratory:  Negative for cough, shortness of breath and wheezing.   Cardiovascular:  Negative for chest pain and palpitations.  Gastrointestinal:  Negative for heartburn and nausea.  Genitourinary: Negative.   Musculoskeletal:  Negative for falls.  Skin:  Negative for itching and rash.  Neurological:  Negative for dizziness, tingling, tremors and headaches.  Endo/Heme/Allergies:        See allergy listing  Psychiatric/Behavioral:  Positive for depression and substance abuse. The patient is nervous/anxious and has insomnia.    Blood pressure 132/86, pulse (!) 37, temperature 97.8 F (36.6 C), temperature source Oral, resp. rate 20, height 5\' 10"  (1.778 m), weight 62.1 kg, SpO2 98%. Body mass index is 19.66 kg/m.   COGNITIVE FEATURES THAT CONTRIBUTE TO RISK:  Polarized thinking    SUICIDE RISK:   Severe:  Frequent, intense, and enduring suicidal ideation, specific plan, no subjective intent, but some objective markers of intent (i.e., choice of lethal method), the method is accessible, some limited preparatory behavior, evidence of impaired self-control, severe dysphoria/symptomatology, multiple risk factors present, and few if any protective factors, particularly a lack of social support.  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 daily for depression and anxiety --Continue hydroxyzine 25 mg p.o. 3 times daily as needed for anxiety --Continue trazodone 50 mg p.o. q. nightly as needed for insomnia  Agitation protocol: Benadryl capsule 50 mg p.o. or IM 3 times daily as needed agitation   Haldol tablets 5 mg po IM 3 times daily as needed agitation   Lorazepam tablet 2 mg p.o. or IM 3 times daily as needed agitation    Other PRN Medications -Acetaminophen 650 mg every 6 as needed/mild pain -Maalox 30 mL oral every 4 as needed/digestion -Magnesium hydroxide 30 mL daily as needed/mild constipation  -- The risks/benefits/side-effects/alternatives to this medication were discussed in detail with the patient and time was given for questions. The patient consents to medication trial.              -- Encouraged patient to participate  in unit milieu and in scheduled group therapies   Labs: CMP: Creatinine 1.38 high, AST 54 high, estimated GFR 59 high.  CBC with differential: RBC 4.21 low, MCV 101.2 high.  UDS: Positive for cocaine.  Labs ordered: Lipid panel, hemoglobin A1c.  Awaiting results at this time  EKG: Sinus rhythm with frequent premature ventricular complexes, ventricular rate 71, QT/QTc 384/417  Safety and Monitoring: Voluntary admission to inpatient psychiatric unit for safety, stabilization and treatment Daily contact with patient to assess and evaluate symptoms and progress in treatment Patient's case to be discussed in multi-disciplinary team meeting Observation Level : q15 minute checks Vital signs: q12 hours Precautions: suicide, but pt currently verbally contracts for safety on unit    Discharge Planning: Social  work and case management to assist with discharge planning and identification of hospital follow-up needs prior to discharge Estimated LOS: 5-7 days Discharge Concerns: Need to establish a safety plan; Medication compliance and  effectiveness Discharge Goals: Return home with outpatient referrals for mental health follow-up including medication management/psychotherapy.  Long Term Goal(s): Improvement in symptoms so as ready for discharge  Short Term Goals: Ability to identify changes in lifestyle to reduce recurrence of condition will improve, Ability to verbalize feelings will improve, Ability to disclose and discuss suicidal ideas, Ability to demonstrate self-control will improve, Ability to identify and develop effective coping behaviors will improve, Ability to maintain clinical measurements within normal limits will improve, Compliance with prescribed medications will improve, and Ability to identify triggers associated with substance abuse/mental health issues will improve  Physician Treatment Plan for Secondary Diagnosis: Principal Problem:   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   Cecilie Lowers, FNP 02/09/2023, 3:35 PM

## 2023-02-09 NOTE — Group Note (Signed)
[facility] LCSW Group Therapy Note   Group Date: 02/09/2023 Start Time: 1100 End Time: 1140   Type of Therapy/Topic:  Group Therapy:  Emotion Regulation  Participation Level:  Minimal   Mood:  Description of Group:    The purpose of this group is to assist patients in learning to regulate negative emotions and experience positive emotions. Patients will be guided to discuss ways in which they have been vulnerable to their negative emotions. These vulnerabilities will be juxtaposed with experiences of positive emotions or situations, and patients challenged to use positive emotions to combat negative ones. Special emphasis will be placed on coping with negative emotions in conflict situations, and patients will process healthy conflict resolution skills.  Therapeutic Goals: Patient will identify two positive emotions or experiences to reflect on in order to balance out negative emotions:  Patient will label two or more emotions that they find the most difficult to experience:  Patient will be able to demonstrate positive conflict resolution skills through discussion or role plays:   Summary of Patient Progress:   Patient was engaged and present the entire group. Patient  participated the first part of group.     Therapeutic Modalities:   Cognitive Behavioral Therapy Feelings Identification Dialectical Behavioral Therapy   Starleen Arms, LCSW

## 2023-02-09 NOTE — Progress Notes (Signed)
   02/09/23 1000  Psych Admission Type (Psych Patients Only)  Admission Status Voluntary  Psychosocial Assessment  Patient Complaints Substance abuse  Eye Contact Fair  Facial Expression Flat  Affect Anxious  Speech Logical/coherent  Interaction Assertive  Motor Activity Other (Comment) (Unremarkable.)  Appearance/Hygiene Unremarkable  Behavior Characteristics Cooperative;Calm  Mood Depressed;Anxious  Thought Process  Coherency WDL  Content WDL  Delusions None reported or observed  Perception Hallucinations  Hallucination Visual (Shadows, "I always feel there is a shadow with me.")  Judgment Impaired  Confusion None  Danger to Self  Current suicidal ideation? Denies  Agreement Not to Harm Self Yes  Description of Agreement Verbal  Danger to Others  Danger to Others None reported or observed

## 2023-02-09 NOTE — [facility] Group Notes (Signed)
[facility] Group Notes:  (Nursing/MHT/Case Management/Adjunct)  Date:  02/09/2023  Time:  10:01 PM  Type of Therapy:  Psychoeducational Skills  Participation Level:  Minimal  Participation Quality:  Inattentive  Affect:  Blunted  Cognitive:  Lacking  Insight:  Limited  Engagement in Group:  Limited  Modes of Intervention:  Support  Summary of Progress/Problems: The patient rated his day as a 7 out of 10 but did not offer any additional details. He did not offer that much in terms of a goal for tomorrow except to say that he had "no good answers". His goal for tomorrow is to ask the doctor and social worker additional questions .   Arelis Neumeier S 02/09/2023, 10:01 PM

## 2023-02-10 ENCOUNTER — Encounter (HOSPITAL_COMMUNITY): Payer: Self-pay

## 2023-02-10 DIAGNOSIS — F1494 Cocaine use, unspecified with cocaine-induced mood disorder: Secondary | ICD-10-CM | POA: Diagnosis not present

## 2023-02-10 MED ORDER — SERTRALINE HCL 50 MG PO TABS
50.0000 mg | ORAL_TABLET | Freq: Every day | ORAL | Status: DC
  Administered 2023-02-11 – 2023-02-16 (×6): 50 mg via ORAL
  Filled 2023-02-10: qty 7
  Filled 2023-02-10 (×2): qty 1
  Filled 2023-02-10 (×2): qty 7
  Filled 2023-02-10: qty 14
  Filled 2023-02-10 (×2): qty 7

## 2023-02-10 NOTE — [facility] Group Notes (Signed)
Adult Psychoeducational Group Note  Date:  02/10/2023 Time:  9:41 PM  Group Topic/Focus:  Wrap-Up Group:   The focus of this group is to help patients review their daily goal of treatment and discuss progress on daily workbooks.  Participation Level:  Did Not Attend  Richard Moon 02/10/2023, 9:41 PM

## 2023-02-10 NOTE — Progress Notes (Signed)
   02/10/23 0558  15 Minute Checks  Location Bedroom  Visual Appearance Calm  Behavior Sleeping  Sleep (Behavioral Health Patients Only)  Calculate sleep? (Click Yes once per 24 hr at 0600 safety check) Yes  Documented sleep last 24 hours 6.75

## 2023-02-10 NOTE — Plan of Care (Signed)
  Problem: Education: Goal: Emotional status will improve Outcome: Progressing Goal: Mental status will improve Outcome: Progressing   Problem: Activity: Goal: Interest or engagement in activities will improve Outcome: Progressing Goal: Sleeping patterns will improve Outcome: Progressing

## 2023-02-10 NOTE — Progress Notes (Signed)
   02/09/23 2200  Psych Admission Type (Psych Patients Only)  Admission Status Voluntary  Psychosocial Assessment  Patient Complaints Anxiety;Depression  Eye Contact Fair  Facial Expression Flat  Affect Anxious  Speech Logical/coherent  Interaction Assertive  Motor Activity Other (Comment)  Appearance/Hygiene Unremarkable  Behavior Characteristics Cooperative;Calm  Mood Depressed;Anxious  Thought Process  Coherency WDL  Content WDL  Delusions None reported or observed  Perception Hallucinations  Hallucination Visual  Judgment Impaired  Confusion None  Danger to Self  Current suicidal ideation? Denies  Agreement Not to Harm Self Yes  Description of Agreement Verbal  Danger to Others  Danger to Others None reported or observed

## 2023-02-10 NOTE — [facility] Group Notes (Signed)
Spirituality group facilitated by Chaplain Katy Claussen, B[facility].   Group Description: Group focused on topic of hope. Patients participated in facilitated discussion around topic, connecting with one another around experiences and definitions for hope. Group members engaged with visual explorer photos, reflecting on what hope looks like for them today. Group engaged in discussion around how their definitions of hope are present today in hospital.   Modalities: Psycho-social ed, Adlerian, Narrative, MI   Patient Progress: Did not attend.  

## 2023-02-10 NOTE — Progress Notes (Addendum)
Pt denied SI/HI/AH this morning. Pt endorses visual hallucinations, stating "I always see shadows". When asked about depression and anxiety patient reports "I am always depressed and anxious". Pt presents with a depressed/ flat affect. Pt observed attending groups throughout the day. PRN Tylenol administered for headache per MAR. Pt has been calm and cooperative throughout the shift. Pt given scheduled medications as prescribed. Q15 min checks verified for safety. Patient verbally contracts for safety. Patient compliant with medications and treatment plan. Pt is safe on the unit.   02/10/23 0900  Psych Admission Type (Psych Patients Only)  Admission Status Voluntary  Psychosocial Assessment  Patient Complaints Anxiety;Depression  Eye Contact Fair  Facial Expression Flat  Affect Depressed;Sad  Speech Logical/coherent  Interaction Assertive  Motor Activity Other (Comment) (WDL)  Appearance/Hygiene Unremarkable  Behavior Characteristics Cooperative;Calm  Mood Depressed;Sad  Thought Process  Coherency WDL  Content WDL  Delusions None reported or observed  Perception Hallucinations ("Seeing shadows")  Hallucination Visual  Judgment Impaired  Confusion None  Danger to Self  Current suicidal ideation? Denies  Agreement Not to Harm Self Yes  Description of Agreement verbal  Danger to Others  Danger to Others None reported or observed

## 2023-02-10 NOTE — Plan of Care (Signed)
  Problem: Education: Goal: Knowledge of [facility] Education information/materials will improve Outcome: Progressing   Problem: Education: Goal: Emotional status will improve Outcome: Progressing   Problem: Education: Goal: Verbalization of understanding the information provided will improve Outcome: Progressing   Problem: Activity: Goal: Sleeping patterns will improve Outcome: Progressing   Problem: Coping: Goal: Ability to verbalize frustrations and anger appropriately will improve Outcome: Progressing   Problem: Safety: Goal: Periods of time without injury will increase Outcome: Progressing   Problem: Education: Goal: Verbalization of understanding the information provided will improve Outcome: Progressing   Problem: Coping: Goal: Ability to verbalize frustrations and anger appropriately will improve Outcome: Progressing

## 2023-02-10 NOTE — Group Note (Signed)
Recreation Therapy Group Note   Group Topic:Problem Solving  Group Date: 02/10/2023 Start Time: 0935 End Time: 1005 Facilitators: Justinian Miano-McCall, LRT,CTRS Location: [address] Dayroom   Goal Area(s) Addresses:  Patient will effectively work with peer towards shared goal.  Patient will identify skills used to make activity successful.  Patient will share challenges and verbalize solution-driven approaches used. Patient will identify how skills used during activity can be used to reach post d/c goals.   Group Description: [employer]. Patients were provided the following materials: 4 drinking straws, 5 rubber bands, 5 paper clips, 2 index cards, 2 drinking cups, and 2 toilet paper rolls. Using the provided materials patients were asked to build a launching mechanism to launch a ping pong ball across the room, approximately 10 feet. Patients were divided into teams of 3-5. Instructions required all materials be incorporated into the device, functionality of items left to the peer group's discretion.   Affect/Mood: Flat   Participation Level: None   Participation Quality: None   Behavior: Appropriate   Speech/Thought Process: Preoccupied   Insight: None   Judgement: None   Modes of Intervention: STEM Activity   Patient Response to Interventions:  Attentive and Disengaged   Education Outcome:  In group clarification offered    Clinical Observations/Individualized Feedback: Pt didn't participate in group session. Pt was preoccupied with having a headache. Pt was eventually called out of group by NP and didn't return.     Plan: Continue to engage patient in RT group sessions 2-3x/week.   Joyceann Kruser-McCall, LRT,CTRS 02/10/2023 12:24 PM

## 2023-02-10 NOTE — Progress Notes (Signed)
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 He is known in this [facility] & the [facility] for complain of worsening symptoms of depression, drug use & or suicidal ideations. Chart review reports indicated that patient has been seen at the other psychiatric hospitals within the surrounding areas with similar complaints. He is admitted to the [facility] this time around with complaint of suicidal ideations with plan to over dose on crack cocaine that did not completely work. His UDS was positive for cocaine. After medical evaluation/stabilization & clearance, he was transferred to the [facility] for further psychiatric evaluation & treatments   Yesterday the psychiatry team made the following recommendations:  -- Increase Zoloft from 25 mg p.o. to 50 mg p.o. daily for depression and anxiety starting 02/11/2023. -- Continue hydroxyzine 25 mg p.o. 3 times daily as needed for anxiety -- Continue trazodone 50 mg p.o. q. nightly as needed for insomnia   On assessment today, the pt reports that his mood is depressed, and rates depression as #9/10, with 10 being high severity.  Zoloft 25 mg p.o. daily increased to 50 mg p.o. daily for depression and anxiety Reports that anxiety is is at manageable level and rated as #7/10, with 10 being high severity Nursing staff report patient sleeping over 8 hours last night and being restful.  Appetite is good, observed in the cafeteria completing his meals Concentration is improving Energy level is adequate Endorses passive suicidal thoughts without suicidal intent or plan.  Able to contract for safety while at  [facility]. Denies having any HI.  Endorses having psychotic symptoms of visual hallucination of seeing dark shadows.  Denies having side effects to current psychiatric medications.   We discussed changes to current medication regimen, including increasing Zoloft from 25 mg to 50 mg p.o. daily starting tomorrow 02/11/2023  Discussed the following psychosocial stressors: Attending therapeutic milieu and unit group activities which could improve his mood.  Total Time spent with patient: 45 minutes  Past Psychiatric History: Previous Psych Diagnoses: MDD, stimulant use disorder Prior inpatient treatment: Multiple psychiatric inpatient treatment Current/prior outpatient treatment: Denies Prior rehab hx: Yes multiple times Psychotherapy hx: Yes History of suicide: Yes, multiple times.  Recently attempted to overdose on crack cocaine. History of homicide or aggression: Denies Psychiatric medication history: Yes,, trial of medication on Zoloft and hydroxyzine Psychiatric medication compliance history: Noncompliance Neuromodulation history: Denies Current Psychiatrist: Denies Current therapist: Denies  Past Medical History: History reviewed. No pertinent past medical history.  Past Surgical History:  Procedure Laterality Date   HERNIA REPAIR     Family History: History reviewed. No pertinent family history.  Family Psychiatric  History: See H&P  Social History:  Social History   Substance and Sexual Activity  Alcohol Use Yes   Comment: Occasional alcohol use     Social History   Substance and Sexual Activity  Drug Use Yes   Types: Marijuana, Cocaine, Heroin   Comment: daily cocaine use (reported $200-300/ daily); occasional cannabis use    Social History   Socioeconomic History   Marital status: Single    Spouse name: Not on file   Number of children: Not on file   Years of  education: Not on file   Highest education level: Not on file  Occupational History   Not on file   Tobacco Use   Smoking status: Some Days    Current packs/day: 0.25    Average packs/day: 0.3 packs/day for 20.0 years (5.0 ttl pk-yrs)    Types: Cigarettes   Smokeless tobacco: Never  Vaping Use   Vaping status: Every Day  Substance and Sexual Activity   Alcohol use: Yes    Comment: Occasional alcohol use   Drug use: Yes    Types: Marijuana, Cocaine, Heroin    Comment: daily cocaine use (reported $200-300/ daily); occasional cannabis use   Sexual activity: Yes    Birth control/protection: Condom  Other Topics Concern   Not on file  Social History Narrative   Not on file   Social Determinants of Health   Financial Resource Strain: Not on file  Food Insecurity: Patient Declined (02/08/2023)   Hunger Vital Sign    Worried About Running Out of Food in the Last Year: Patient declined    Ran Out of Food in the Last Year: Patient declined  Transportation Needs: Unknown (02/08/2023)   PRAPARE - Administrator, Civil Service (Medical): Patient declined    Lack of Transportation (Non-Medical): No  Physical Activity: Not on file  Stress: Not on file  Social Connections: Not on file   Additional Social History:     Sleep: Good  Appetite:  Good  Current Medications: Current Facility-Administered Medications  Medication Dose Route Frequency Provider Last Rate Last Admin   acetaminophen (TYLENOL) tablet 650 mg  650 mg Oral Q6H PRN Massengill, Harrold Donath, MD   650 mg at 02/10/23 0914   alum & mag hydroxide-simeth (MAALOX/MYLANTA) 200-200-20 MG/5ML suspension 30 mL  30 mL Oral Q4H PRN Massengill, Harrold Donath, MD       diphenhydrAMINE (BENADRYL) capsule 50 mg  50 mg Oral TID PRN Massengill, Harrold Donath, MD       Or   diphenhydrAMINE (BENADRYL) injection 50 mg  50 mg Intramuscular TID PRN Massengill, Harrold Donath, MD       haloperidol (HALDOL) tablet 5 mg  5 mg Oral TID PRN Phineas Inches, MD       Or   haloperidol lactate (HALDOL) injection 5 mg  5 mg Intramuscular TID PRN Massengill,  Harrold Donath, MD       hydrOXYzine (ATARAX) tablet 25 mg  25 mg Oral TID Phineas Inches, MD   25 mg at 02/10/23 0913   LORazepam (ATIVAN) tablet 2 mg  2 mg Oral TID PRN Phineas Inches, MD       Or   LORazepam (ATIVAN) injection 2 mg  2 mg Intramuscular TID PRN Massengill, Harrold Donath, MD       magnesium hydroxide (MILK OF MAGNESIA) suspension 30 mL  30 mL Oral Daily PRN Massengill, Nathan, MD       sertraline (ZOLOFT) tablet 25 mg  25 mg Oral Daily Nakeyia Menden C, FNP   25 mg at 02/10/23 0913   traZODone (DESYREL) tablet 50 mg  50 mg Oral QHS PRN Phineas Inches, MD   50 mg at 02/09/23 2153    Lab Results:  Results for orders placed or performed during the hospital encounter of 02/08/23 (from the past 48 hour(s))  Lipid panel     Status: None   Collection Time: 02/09/23  6:25 PM  Result Value Ref Range   Cholesterol 174 0 - 200 mg/dL   Triglycerides 161 <096 mg/dL   HDL 79 >  40 mg/dL   Total CHOL/HDL Ratio 2.2 RATIO   VLDL 30 0 - 40 mg/dL   LDL Cholesterol 65 0 - 99 mg/dL    Comment:        Total Cholesterol/HDL:CHD Risk Coronary Heart Disease Risk Table                     Men   Women  1/2 Average Risk   3.4   3.3  Average Risk       5.0   4.4  2 X Average Risk   9.6   7.1  3 X Average Risk  23.4   11.0        Use the calculated Patient Ratio above and the CHD Risk Table to determine the patient's CHD Risk.        ATP III CLASSIFICATION (LDL):  <100     mg/dL   Optimal  161-096  mg/dL   Near or Above                    Optimal  130-159  mg/dL   Borderline  045-409  mg/dL   High  >811     mg/dL   Very High Performed at [facility], [address], [place], [place] [address]   Hemoglobin A1c     Status: Abnormal   Collection Time: 02/09/23  6:25 PM  Result Value Ref Range   Hgb A1c MFr Bld 6.0 (H) 4.8 - 5.6 %    Comment: (NOTE) Pre diabetes:          5.7%-6.4%  Diabetes:              >6.4%  Glycemic control for   <7.0% adults with diabetes     Mean Plasma Glucose 125.5 mg/dL    Comment: Performed at [facility], [address] [place], [place] [address]   Blood Alcohol level:  Lab Results  Component Value Date   ETH <10 02/06/2023   ETH <10 12/04/2021    Metabolic Disorder Labs: Lab Results  Component Value Date   HGBA1C 6.0 (H) 02/09/2023   MPG 125.5 02/09/2023   MPG 119.76 04/02/2021   No results found for: "PROLACTIN" Lab Results  Component Value Date   CHOL 174 02/09/2023   TRIG 148 02/09/2023   HDL 79 02/09/2023   CHOLHDL 2.2 02/09/2023   VLDL 30 02/09/2023   LDLCALC 65 02/09/2023   LDLCALC 99 04/02/2021    Physical Findings: AIMS: Facial and Oral Movements Muscles of Facial Expression: None, normal Lips and Perioral Area: None, normal Jaw: None, normal Tongue: None, normal,Extremity Movements Upper (arms, wrists, hands, fingers): None, normal Lower (legs, knees, ankles, toes): None, normal, Trunk Movements Neck, shoulders, hips: None, normal, Overall Severity Severity of abnormal movements (highest score from questions above): None, normal Incapacitation due to abnormal movements: None, normal Patient's awareness of abnormal movements (rate only patient's report): No Awareness, Dental Status Current problems with teeth and/or dentures?: No Does patient usually wear dentures?: No  CIWA:    COWS:     Musculoskeletal: Strength & Muscle Tone: within normal limits Gait & Station: normal Patient leans: N/A  Psychiatric Specialty Exam:  Presentation  General Appearance:  Casual  Eye Contact: Fair  Speech: Clear and Coherent  Speech Volume: Normal  Handedness: Right  Mood and Affect  Mood: Anxious; Depressed  Affect: Blunt; Congruent  Thought Process  Thought Processes: Coherent; Goal Directed  Descriptions of Associations:Intact  Orientation:Full (Time, Place and Person)  Thought Content:Rumination  History of Schizophrenia/Schizoaffective disorder:No  Duration  of Psychotic Symptoms:No data recorded Hallucinations:Hallucinations: None  Ideas of Reference:None  Suicidal Thoughts:Suicidal Thoughts: Yes, Passive SI Active Intent and/or Plan: -- (n/a) SI Passive Intent and/or Plan: Without Intent; Without Plan; Without Means to Carry Out  Homicidal Thoughts:Homicidal Thoughts: No  Sensorium  Memory: Immediate Fair; Recent Fair  Judgment: Poor  Insight: Poor  Executive Functions  Concentration: Fair  Attention Span: Fair  Recall: [employer] of Knowledge: Fair  Language: Fair  Psychomotor Activity  Psychomotor Activity: Psychomotor Activity: Normal  Assets  Assets: Manufacturing systems engineer; Desire for Improvement; Physical Health; Resilience  Sleep  Sleep: Sleep: Good Number of Hours of Sleep: 8  Physical Exam: Physical Exam Vitals and nursing note reviewed.  HENT:     Head: Normocephalic.     Nose: Nose normal.     Mouth/Throat:     Mouth: Mucous membranes are moist.  Eyes:     Pupils: Pupils are equal, round, and reactive to light.  Cardiovascular:     Rate and Rhythm: Normal rate.     Pulses: Normal pulses.  Pulmonary:     Effort: Pulmonary effort is normal.  Abdominal:     Comments: Deferred  Genitourinary:    Comments: Deferred Musculoskeletal:        General: Normal range of motion.     Cervical back: Normal range of motion.  Skin:    General: Skin is warm.  Neurological:     Mental Status: He is alert and oriented to person, place, and time.  Psychiatric:        Behavior: Behavior normal.    Review of Systems  Constitutional:  Negative for chills and fever.  HENT:  Negative for sore throat.   Eyes:  Negative for blurred vision.  Respiratory:  Negative for cough, shortness of breath and wheezing.   Cardiovascular:  Negative for chest pain and palpitations.  Gastrointestinal:  Negative for abdominal pain, heartburn, nausea and vomiting.  Genitourinary: Negative.   Musculoskeletal: Negative.    Skin:  Negative for itching and rash.  Neurological:  Positive for headaches. Negative for dizziness and tingling.  Endo/Heme/Allergies:        See allergy listing  Psychiatric/Behavioral:  Positive for depression, hallucinations, substance abuse and suicidal ideas. The patient is nervous/anxious.    Blood pressure 99/73, pulse 84, temperature 98.1 F (36.7 C), temperature source Oral, resp. rate 20, height 5\' 10"  (1.778 m), weight 62.1 kg, SpO2 97%. Body mass index is 19.66 kg/m.   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 to 50 mg p.o. daily for depression and anxiety -- Continue hydroxyzine 25 mg p.o. 3 times daily as needed for anxiety -- Continue trazodone 50 mg p.o. q. nightly as needed for insomnia   Agitation protocol: Benadryl capsule 50 mg p.o. or IM 3 times daily as needed agitation   Haldol tablets 5 mg po IM 3 times daily as needed agitation   Lorazepam tablet 2 mg p.o. or IM 3 times daily as needed agitation     Other PRN Medications -Acetaminophen 650 mg every 6 as needed/mild pain -Maalox 30 mL oral every 4 as needed/digestion -Magnesium hydroxide 30 mL daily as  needed/mild constipation   -- The risks/benefits/side-effects/alternatives to this medication were discussed in detail with the patient and time was given for questions. The patient consents to medication trial.              -- Encouraged patient to participate  in unit milieu and in scheduled group therapies    Labs: CMP: Creatinine 1.38 high, AST 54 high, estimated GFR 59 high.  CBC with differential: RBC 4.21 low, MCV 101.2 high.  UDS: Positive for cocaine.   Labs ordered: Lipid panel, hemoglobin A1c.   Awaiting results at this time. 02/10/23: Lipid panel: WNL, hemoglobin A1c: 6.0 high   EKG: Sinus rhythm with frequent premature ventricular complexes, ventricular rate 71, QT/QTc 384/417   Safety and Monitoring: Voluntary admission to inpatient psychiatric unit for safety, stabilization and treatment Daily contact with patient to assess and evaluate symptoms and progress in treatment Patient's case to be discussed in multi-disciplinary team meeting Observation Level : q15 minute checks Vital signs: q12 hours Precautions: suicide, but pt currently verbally contracts for safety on unit    Discharge Planning: Social work and case management to assist with discharge planning and identification of hospital follow-up needs prior to discharge Estimated LOS: 5-7 days Discharge Concerns: Need to establish a safety plan; Medication compliance and effectiveness Discharge Goals: Return home with outpatient referrals for mental health follow-up including medication management/psychotherapy.   Long Term Goal(s): Improvement in symptoms so as ready for discharge   Short Term Goals: Ability to identify changes in lifestyle to reduce recurrence of condition will improve, Ability to verbalize feelings will improve, Ability to disclose and discuss suicidal ideas, Ability to demonstrate self-control will improve, Ability to identify and develop effective coping behaviors will improve, Ability to maintain clinical measurements within normal limits will improve, Compliance with prescribed medications will improve, and Ability to identify triggers associated with substance abuse/mental health issues will improve   Physician Treatment Plan for Secondary Diagnosis: Principal Problem:   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    Cecilie Lowers, FNP 02/10/2023, 10:56 AM

## 2023-02-10 NOTE — BH IP Treatment Plan (Signed)
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)   Current Medications:  Current Facility-Administered Medications  Medication Dose Route Frequency Provider Last Rate Last Admin   acetaminophen (TYLENOL) tablet 650 mg  650 mg Oral Q6H PRN Phineas Inches, MD   650 mg at 02/10/23 0914   alum & mag hydroxide-simeth (MAALOX/MYLANTA) 200-200-20 MG/5ML suspension 30 mL  30 mL Oral Q4H PRN Massengill, Harrold Donath, MD       diphenhydrAMINE (BENADRYL) capsule 50 mg  50 mg Oral TID PRN Phineas Inches, MD       Or   diphenhydrAMINE (BENADRYL) injection 50 mg  50 mg Intramuscular TID PRN Massengill, Harrold Donath, MD       haloperidol (HALDOL) tablet 5 mg  5 mg Oral TID PRN Phineas Inches, MD       Or   haloperidol lactate (HALDOL) injection 5 mg  5 mg Intramuscular TID PRN Massengill, Harrold Donath, MD       hydrOXYzine (ATARAX) tablet 25 mg  25 mg Oral TID Phineas Inches, MD   25 mg at 02/10/23 1146   LORazepam (ATIVAN) tablet 2 mg  2 mg Oral TID PRN Phineas Inches, MD       Or   LORazepam (ATIVAN) injection 2 mg  2 mg Intramuscular TID PRN Massengill, Harrold Donath, MD       magnesium hydroxide (MILK OF MAGNESIA) suspension 30 mL  30 mL Oral Daily PRN Massengill, Nathan, MD       sertraline (ZOLOFT) tablet 25 mg  25 mg Oral Daily Ntuen, Tina C, FNP   25 mg at 02/10/23 0913   traZODone (DESYREL) tablet 50 mg  50 mg Oral QHS PRN Phineas Inches, MD   50 mg at 02/09/23 2153   PTA Medications: No medications prior to admission.    Patient Stressors: Neurosurgeon issue   Substance abuse    Patient Strengths: Ability for Contractor for treatment/growth   Treatment Modalities: Medication  Management, Group therapy, Case management,  1 to 1 session with clinician, Psychoeducation, Recreational therapy.   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) Long Term Goal(s): Improvement in symptoms so as ready for discharge   Short Term Goals: Ability to identify changes in lifestyle to reduce recurrence of condition will improve Ability to verbalize feelings will improve Ability to disclose and discuss suicidal ideas Ability to demonstrate self-control will improve Ability to identify and develop effective coping behaviors will improve Ability to maintain clinical measurements within normal limits will improve Compliance with prescribed medications will improve Ability to identify triggers associated with substance abuse/mental health issues will improve  Medication Management: Evaluate patient's response, side effects, and tolerance of medication regimen.  Therapeutic Interventions: 1 to 1 sessions, Unit Group sessions and Medication administration.  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)  Long Term Goal(s): Improvement in symptoms so as ready for discharge   Short Term Goals: Ability to identify changes in lifestyle to reduce recurrence of condition will improve Ability to verbalize feelings will improve Ability to disclose and discuss suicidal ideas Ability to demonstrate self-control will improve Ability to identify and develop effective coping behaviors will improve Ability to maintain clinical  measurements within normal limits will improve Compliance with prescribed medications will improve Ability to identify triggers associated with substance abuse/mental health issues will improve     Medication Management: Evaluate patient's response, side effects, and tolerance of  medication regimen.  Therapeutic Interventions: 1 to 1 sessions, Unit Group sessions and Medication administration.  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) Long Term Goal(s): Knowledge of disease and therapeutic regimen to maintain health will improve  Short Term Goals: Ability to remain free from injury will improve, Ability to verbalize frustration and anger appropriately will improve, Ability to participate in decision making will improve, Ability to verbalize feelings will improve, Ability to identify and develop effective coping behaviors will improve, and Compliance with prescribed medications will improve  Medication Management: RN will administer medications as ordered by provider, will assess and evaluate patient's response and provide education to patient for prescribed medication. RN will report any adverse and/or side effects to prescribing provider.  Therapeutic Interventions: 1 on 1 counseling sessions, Psychoeducation, Medication administration, Evaluate responses to treatment, Monitor vital signs and CBGs as ordered, Perform/monitor CIWA, COWS, AIMS and Fall Risk screenings as ordered, Perform wound care treatments as ordered.  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  Short Term Goals: Engage patient in aftercare planning with referrals and resources, Increase social support, Increase emotional regulation, Facilitate acceptance of mental health diagnosis and concerns, Identify triggers associated with mental health/substance abuse issues, and Increase skills for wellness and recovery  Therapeutic Interventions: Assess for all  discharge needs, 1 to 1 time with Social worker, Explore available resources and support systems, Assess for adequacy in community support network, Educate family and significant other(s) on suicide prevention, Complete Psychosocial Assessment, Interpersonal group therapy.  Evaluation of Outcomes: Progressing   Progress in Treatment: Attending groups: Yes. Participating in groups: Yes. Taking medication as prescribed: Yes. Toleration medication: Yes. Family/Significant other contact made: No, will contact:  Pt declined consent Patient understands diagnosis: Yes. Discussing patient identified problems/goals with staff: Yes. Medical problems stabilized or resolved: No. Denies suicidal/homicidal ideation: Yes. Issues/concerns per patient self-inventory: No.  New problem(s) identified: No, Describe:  none reported  New Short Term/Long Term Goal(s):detox, medication management for mood stabilization; elimination of SI thoughts; development of comprehensive mental wellness/sobriety plan  Patient Goals:  "find out what's going on with me and go to rehab."  Discharge Plan or Barriers: Patient recently admitted. CSW will continue to follow and assess for appropriate referrals and possible discharge planning.    Reason for Continuation of Hospitalization: Depression Medication stabilization Suicidal ideation  Estimated Length of Stay: 5-7 days  Last 3 [place] Suicide Severity Risk Score: Flowsheet Row [facility] (Current) [facility] from [facility]/2023 in [facility] ED to Hosp-Admission (Discharged) from 05/26/2022 in [facility]  C-SSRS RISK CATEGORY Low Risk High Risk No Risk       Last PHQ 2/9 Scores:    03/31/2021    4:41 PM 05/25/2020    2:08 PM  Depression screen PHQ 2/9  Decreased Interest 2 3  Down, Depressed, Hopeless 2 3  PHQ - 2 Score 4 6   Altered sleeping 2  3  Tired, decreased energy 1 3  Change in appetite 1 3  Feeling bad or failure about yourself  2 3  Trouble concentrating 1 1  Moving slowly or fidgety/restless 1 3  Suicidal thoughts 2 3  PHQ-9 Score 14 25  Difficult doing work/chores Somewhat difficult Very difficult    Scribe for Treatment Team: Izell Clarks, LCSW 02/10/2023 2:10 PM

## 2023-02-11 DIAGNOSIS — F1494 Cocaine use, unspecified with cocaine-induced mood disorder: Secondary | ICD-10-CM | POA: Diagnosis not present

## 2023-02-11 MED ORDER — ENSURE ENLIVE PO LIQD
237.0000 mL | Freq: Two times a day (BID) | ORAL | Status: DC
  Administered 2023-02-12 – 2023-02-15 (×8): 237 mL via ORAL
  Filled 2023-02-11 (×13): qty 237

## 2023-02-11 NOTE — Plan of Care (Signed)
  Problem: Education: Goal: Knowledge of [facility] Education information/materials will improve Outcome: Progressing Goal: Emotional status will improve Outcome: Progressing Goal: Mental status will improve Outcome: Progressing Goal: Verbalization of understanding the information provided will improve Outcome: Progressing   Problem: Coping: Goal: Coping ability will improve Outcome: Progressing   

## 2023-02-11 NOTE — [facility] Group Notes (Signed)
[facility] Group Notes:  (Nursing/MHT/Case Management/Adjunct)  Date:  02/11/2023  Time:  2000  Type of Therapy:   Wrap up group  Participation Level:  Active  Participation Quality:  Appropriate, Attentive, Sharing, and Supportive  Affect:  Appropriate  Cognitive:  Alert  Insight:  Improving  Engagement in Group:  Engaged  Modes of Intervention:  Clarification, Education, and Support  Summary of Progress/Problems: Positive thinking and positive change were discussed.   Richard Moon 02/11/2023, 9:07 PM

## 2023-02-11 NOTE — Progress Notes (Signed)
D: Patient alert and oriented, able to make needs known. Endorses passive SI, denies HI, states he sees shadows at present. Rates pain 7/10 in leg, medication offered but pt declined. Patient goal today "my life and thinking." Rates depression 10/10, hopelessness 8/10, and anxiety 7/10. Patient reports energy level as low. He reports he slept good last night. Patient does not request any PRN medication at this time.   A: Scheduled medications administered to patient per MD order. Support and encouragement provided. Routine safety checks conducted every fifteen minutes. Patient informed to notify staff with problems or concerns. Frequent verbal contact made.   R: No adverse drug reactions noted. Patient contracts for safety at this time. Patient is compliant with medications and treatment plan. Patient receptive, calm and cooperative. Patient interacts with others appropriately on unit at present. Patient remains safe at present.

## 2023-02-11 NOTE — Progress Notes (Signed)
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 He is known in this [facility] & the [facility] for complain of worsening symptoms of depression, drug use & or suicidal ideations. Chart review reports indicated that patient has been seen at the other psychiatric hospitals within the surrounding areas with similar complaints. He is admitted to the [facility] this time around with complaint of suicidal ideations with plan to over dose on crack cocaine that did not completely work. His UDS was positive for cocaine. After medical evaluation/stabilization & clearance, he was transferred to the [facility] for further psychiatric evaluation & treatments   Yesterday the psychiatry team made the following recommendations:   --Continue Zoloft 50 mg p.o. daily for depression and anxiety -- Continue hydroxyzine 25 mg p.o. 3 times daily as needed for anxiety -- Continue trazodone 50 mg p.o. q. nightly as needed for insomnia  On assessment today, the pt reports that his mood is less depressed. He continues on Zoloft 50 mg p.o. daily for depression and anxiety.  He is alert, oriented to person, place, time and situation.  Denies acute discomfort.  Last bowel movement yesterday. Reports that anxiety is at a manageable level and rated as #5/10, with 10 being high severity. Nursing staff report patient sleeping over 7 hours last night and being restful.  Appetite is good, observed in the cafeteria completing his meals Concentration is improving Energy level is adequate Endorses passive suicidal thoughts without suicidal intent or plan.  Able to contract for safety while at  [facility]. Denies having any HI.  Endorses having psychotic symptoms of visual hallucination of seeing dark shadows.  Denies having side effects to current psychiatric medications.   We discussed compliance to current medication regimen and to continue Zoloft tablet 50 mg p.o. daily as ordered  Discussed the following psychosocial stressors: Attending therapeutic milieu and unit group activities which could improve his mood. Per LCSW group therapy note, patient was observed to be minimally participating in group therapy with the topic: Who Am I?  Also provided education on cessation of polysubstance use these, as these has negative effects on overall psychiatric and medical wellbeing.  Plan is to discharge patient to [facility], [facility] or [facility].  Patient currently using the resources provided by LCSW to make phone calls to facilities for accommodation pending discharge.  Total Time spent with patient: 45 minutes  Past Psychiatric History: Previous Psych Diagnoses: MDD, stimulant use disorder Prior inpatient treatment: Multiple psychiatric inpatient treatment Current/prior outpatient treatment: Denies Prior rehab hx: Yes multiple times Psychotherapy hx: Yes History of suicide: Yes, multiple times.  Recently attempted to overdose on crack cocaine. History of homicide or aggression: Denies Psychiatric medication history: Yes,, trial of medication on Zoloft and hydroxyzine Psychiatric medication compliance history: Noncompliance Neuromodulation history: Denies Current Psychiatrist: Denies Current therapist: Denies  Past Medical History: History reviewed. No pertinent past medical history.  Past Surgical History:  Procedure Laterality Date   HERNIA REPAIR     Family History: History reviewed. No pertinent family history.  Family Psychiatric  History: See H&P  Social History:  Social History   Substance and Sexual Activity  Alcohol Use Yes   Comment: Occasional alcohol  use     Social  History   Substance and Sexual Activity  Drug Use Yes   Types: Marijuana, Cocaine, Heroin   Comment: daily cocaine use (reported $200-300/ daily); occasional cannabis use    Social History   Socioeconomic History   Marital status: Single    Spouse name: Not on file   Number of children: Not on file   Years of education: Not on file   Highest education level: Not on file  Occupational History   Not on file  Tobacco Use   Smoking status: Some Days    Current packs/day: 0.25    Average packs/day: 0.3 packs/day for 20.0 years (5.0 ttl pk-yrs)    Types: Cigarettes   Smokeless tobacco: Never  Vaping Use   Vaping status: Every Day  Substance and Sexual Activity   Alcohol use: Yes    Comment: Occasional alcohol use   Drug use: Yes    Types: Marijuana, Cocaine, Heroin    Comment: daily cocaine use (reported $200-300/ daily); occasional cannabis use   Sexual activity: Yes    Birth control/protection: Condom  Other Topics Concern   Not on file  Social History Narrative   Not on file   Social Determinants of Health   Financial Resource Strain: Not on file  Food Insecurity: Patient Declined (02/08/2023)   Hunger Vital Sign    Worried About Running Out of Food in the Last Year: Patient declined    Ran Out of Food in the Last Year: Patient declined  Transportation Needs: Unknown (02/08/2023)   PRAPARE - Administrator, Civil Service (Medical): Patient declined    Lack of Transportation (Non-Medical): No  Physical Activity: Not on file  Stress: Not on file  Social Connections: Not on file   Additional Social History:     Sleep: Good  Appetite:  Good  Current Medications: Current Facility-Administered Medications  Medication Dose Route Frequency Provider Last Rate Last Admin   acetaminophen (TYLENOL) tablet 650 mg  650 mg Oral Q6H PRN Massengill, Harrold Donath, MD   650 mg at 02/10/23 0914   alum & mag hydroxide-simeth (MAALOX/MYLANTA) 200-200-20 MG/5ML suspension 30  mL  30 mL Oral Q4H PRN Massengill, Harrold Donath, MD       diphenhydrAMINE (BENADRYL) capsule 50 mg  50 mg Oral TID PRN Massengill, Harrold Donath, MD       Or   diphenhydrAMINE (BENADRYL) injection 50 mg  50 mg Intramuscular TID PRN Massengill, Harrold Donath, MD       feeding supplement (ENSURE ENLIVE / ENSURE PLUS) liquid 237 mL  237 mL Oral BID BM Massengill, Nathan, MD       haloperidol (HALDOL) tablet 5 mg  5 mg Oral TID PRN Phineas Inches, MD       Or   haloperidol lactate (HALDOL) injection 5 mg  5 mg Intramuscular TID PRN Massengill, Harrold Donath, MD       hydrOXYzine (ATARAX) tablet 25 mg  25 mg Oral TID Phineas Inches, MD   25 mg at 02/11/23 0855   LORazepam (ATIVAN) tablet 2 mg  2 mg Oral TID PRN Phineas Inches, MD       Or   LORazepam (ATIVAN) injection 2 mg  2 mg Intramuscular TID PRN Massengill, Harrold Donath, MD       magnesium hydroxide (MILK OF MAGNESIA) suspension 30 mL  30 mL Oral Daily PRN Massengill, Nathan, MD       sertraline (ZOLOFT) tablet 50 mg  50 mg Oral Daily Hussam Muniz, Jesusita Oka,  FNP   50 mg at 02/11/23 0855   traZODone (DESYREL) tablet 50 mg  50 mg Oral QHS PRN Phineas Inches, MD   50 mg at 02/10/23 2148    Lab Results:  Results for orders placed or performed during the hospital encounter of 02/08/23 (from the past 48 hour(s))  Lipid panel     Status: None   Collection Time: 02/09/23  6:25 PM  Result Value Ref Range   Cholesterol 174 0 - 200 mg/dL   Triglycerides 914 <782 mg/dL   HDL 79 >95 mg/dL   Total CHOL/HDL Ratio 2.2 RATIO   VLDL 30 0 - 40 mg/dL   LDL Cholesterol 65 0 - 99 mg/dL    Comment:        Total Cholesterol/HDL:CHD Risk Coronary Heart Disease Risk Table                     Men   Women  1/2 Average Risk   3.4   3.3  Average Risk       5.0   4.4  2 X Average Risk   9.6   7.1  3 X Average Risk  23.4   11.0        Use the calculated Patient Ratio above and the CHD Risk Table to determine the patient's CHD Risk.        ATP III CLASSIFICATION (LDL):  <100      mg/dL   Optimal  621-308  mg/dL   Near or Above                    Optimal  130-159  mg/dL   Borderline  657-846  mg/dL   High  >962     mg/dL   Very High Performed at [facility], [address], [place], [place] [address]   Hemoglobin A1c     Status: Abnormal   Collection Time: 02/09/23  6:25 PM  Result Value Ref Range   Hgb A1c MFr Bld 6.0 (H) 4.8 - 5.6 %    Comment: (NOTE) Pre diabetes:          5.7%-6.4%  Diabetes:              >6.4%  Glycemic control for   <7.0% adults with diabetes    Mean Plasma Glucose 125.5 mg/dL    Comment: Performed at [facility], [address] [place], [place] [address]   Blood Alcohol level:  Lab Results  Component Value Date   ETH <10 02/06/2023   ETH <10 12/04/2021    Metabolic Disorder Labs: Lab Results  Component Value Date   HGBA1C 6.0 (H) 02/09/2023   MPG 125.5 02/09/2023   MPG 119.76 04/02/2021   No results found for: "PROLACTIN" Lab Results  Component Value Date   CHOL 174 02/09/2023   TRIG 148 02/09/2023   HDL 79 02/09/2023   CHOLHDL 2.2 02/09/2023   VLDL 30 02/09/2023   LDLCALC 65 02/09/2023   LDLCALC 99 04/02/2021    Physical Findings: AIMS: Facial and Oral Movements Muscles of Facial Expression: None, normal Lips and Perioral Area: None, normal Jaw: None, normal Tongue: None, normal,Extremity Movements Upper (arms, wrists, hands, fingers): None, normal Lower (legs, knees, ankles, toes): None, normal, Trunk Movements Neck, shoulders, hips: None, normal, Overall Severity Severity of abnormal movements (highest score from questions above): None, normal Incapacitation due to abnormal movements: None, normal Patient's awareness of abnormal movements (rate only patient's report):  No Awareness, Dental Status Current problems with teeth and/or dentures?: No Does patient usually wear dentures?: No  CIWA:    COWS:     Musculoskeletal: Strength & Muscle Tone: within normal limits Gait &  Station: normal Patient leans: N/A  Psychiatric Specialty Exam:  Presentation  General Appearance:  Casual  Eye Contact: Fair  Speech: Clear and Coherent; Normal Rate  Speech Volume: Normal  Handedness: Right  Mood and Affect  Mood: Anxious; Depressed  Affect: Congruent  Thought Process  Thought Processes: Coherent  Descriptions of Associations:Intact  Orientation:Full (Time, Place and Person)  Thought Content:Rumination  History of Schizophrenia/Schizoaffective disorder:No  Duration of Psychotic Symptoms:No data recorded Hallucinations:Hallucinations: Visual Description of Visual Hallucinations: Reports seeing shadows  Ideas of Reference:None  Suicidal Thoughts:Suicidal Thoughts: Yes, Passive SI Active Intent and/or Plan: -- (n/a) SI Passive Intent and/or Plan: Without Intent; Without Means to Carry Out; Without Access to Means  Homicidal Thoughts:Homicidal Thoughts: No  Sensorium  Memory: Immediate Fair; Recent Fair  Judgment: Poor  Insight: Poor  Executive Functions  Concentration: Fair  Attention Span: Fair  Recall: [employer] of Knowledge: Fair  Language: Fair  Psychomotor Activity  Psychomotor Activity: Psychomotor Activity: Normal  Assets  Assets: Communication Skills; Desire for Improvement; Physical Health; Resilience  Sleep  Sleep: Sleep: Good Number of Hours of Sleep: 7  Physical Exam: Physical Exam Vitals and nursing note reviewed.  HENT:     Head: Normocephalic.     Nose: Nose normal.     Mouth/Throat:     Mouth: Mucous membranes are moist.  Eyes:     Pupils: Pupils are equal, round, and reactive to light.  Cardiovascular:     Rate and Rhythm: Normal rate.     Pulses: Normal pulses.  Pulmonary:     Effort: Pulmonary effort is normal.  Abdominal:     Comments: Deferred  Genitourinary:    Comments: Deferred Musculoskeletal:        General: Normal range of motion.     Cervical back: Normal range  of motion.  Skin:    General: Skin is warm.  Neurological:     Mental Status: He is alert and oriented to person, place, and time.  Psychiatric:        Behavior: Behavior normal.    Review of Systems  Constitutional:  Negative for chills and fever.  HENT:  Negative for sore throat.   Eyes:  Negative for blurred vision.  Respiratory:  Negative for cough, shortness of breath and wheezing.   Cardiovascular:  Negative for chest pain and palpitations.  Gastrointestinal:  Negative for abdominal pain, heartburn, nausea and vomiting.  Genitourinary: Negative.   Musculoskeletal: Negative.   Skin:  Negative for itching and rash.  Neurological:  Positive for headaches. Negative for dizziness and tingling.  Endo/Heme/Allergies:        See allergy listing  Psychiatric/Behavioral:  Positive for depression, hallucinations and suicidal ideas. The patient is nervous/anxious.    Blood pressure 108/82, pulse 74, temperature 98.4 F (36.9 C), temperature source Oral, resp. rate 16, height 5\' 10"  (1.778 m), weight 62.1 kg, SpO2 99%. Body mass index is 19.66 kg/m.  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 daily for depression and anxiety -- Continue hydroxyzine 25 mg  p.o. 3 times daily as needed for anxiety -- Continue trazodone 50 mg p.o. q. nightly as needed for insomnia   Agitation protocol: Benadryl capsule 50 mg p.o. or IM 3 times daily as needed agitation   Haldol tablets 5 mg po IM 3 times daily as needed agitation   Lorazepam tablet 2 mg p.o. or IM 3 times daily as needed agitation     Other PRN Medications -Acetaminophen 650 mg every 6 as needed/mild pain -Maalox 30 mL oral every 4 as  needed/digestion -Magnesium hydroxide 30 mL daily as needed/mild constipation   -- The risks/benefits/side-effects/alternatives to this medication were discussed in detail with the patient and time was given for questions. The patient consents to medication trial.              -- Encouraged patient to participate  in unit milieu and in scheduled group therapies    Labs: CMP: Creatinine 1.38 high, AST 54 high, estimated GFR 59 high.  CBC with differential: RBC 4.21 low, MCV 101.2 high.  UDS: Positive for cocaine.   Labs ordered: Lipid panel, hemoglobin A1c.  Awaiting results at this time. 02/10/23: Lipid panel: WNL, hemoglobin A1c: 6.0 high   EKG: Sinus rhythm with frequent premature ventricular complexes, ventricular rate 71, QT/QTc 384/417   Safety and Monitoring: Voluntary admission to inpatient psychiatric unit for safety, stabilization and treatment Daily contact with patient to assess and evaluate symptoms and progress in treatment Patient's case to be discussed in multi-disciplinary team meeting Observation Level : q15 minute checks Vital signs: q12 hours Precautions: suicide, but pt currently verbally contracts for safety on unit    Discharge Planning: Social work and case management to assist with discharge planning and identification of hospital follow-up needs prior to discharge Estimated LOS: 5-7 days Discharge Concerns: Need to establish a safety plan; Medication compliance and effectiveness Discharge Goals: Return home with outpatient referrals for mental health follow-up including medication management/psychotherapy.   Long Term Goal(s): Improvement in symptoms so as ready for discharge   Short Term Goals: Ability to identify changes in lifestyle to reduce recurrence of condition will improve, Ability to verbalize feelings will improve, Ability to disclose and discuss suicidal ideas, Ability to demonstrate self-control will improve, Ability to identify and develop effective  coping behaviors will improve, Ability to maintain clinical measurements within normal limits will improve, Compliance with prescribed medications will improve, and Ability to identify triggers associated with substance abuse/mental health issues will improve   Physician Treatment Plan for Secondary Diagnosis: Principal Problem:   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    Cecilie Lowers, FNP 02/11/2023, 1:48 PM Patient ID: Richard Moon, male   DOB: 1961/02/20, 62 y.o.   MRN: [id number]

## 2023-02-11 NOTE — Group Note (Signed)
LCSW Group Therapy Note   Group Date: 02/11/2023 Start Time: 1100 End Time: 1200   Type of Therapy and Topic:  Group Therapy - Who Am I?  Participation Level:  Minimal  Description of Group The focus of this group was to aid patients in self-exploration and awareness. Patients were guided in exploring various factors of oneself to include interests, readiness to change, management of emotions, and individual perception of self. Patients were provided with complementary worksheets exploring hidden talents, ease of asking other for help, music/media preferences, understanding and responding to feelings/emotions, and hope for the future. At group closing, patients were encouraged to adhere to discharge plan to assist in continued self-exploration and understanding.  Therapeutic Goals Patients learned that self-exploration and awareness is an ongoing process Patients identified their individual skills, preferences, and abilities Patients explored their openness to establish and confide in supports Patients explored their readiness for change and progression of mental health  Summary of Patient Progress:  Patient actively engaged in introductory check-in. Patient actively engaged in activity of self-exploration and identification, completing complementary worksheet to assist in discussion. Patient identified various factors ranging from hidden talents, favorite music and movies, trusted individuals, accountability, and individual perceptions of self and hope. Pt engaged in processing thoughts and feelings as well as means of reframing thoughts. Pt proved receptive of alternate group members input and feedback from CSW.   Therapeutic Modalities Cognitive Behavioral Therapy Motivational Interviewing Kathrynn Humble 02/11/2023  12:35 PM

## 2023-02-11 NOTE — Group Note (Signed)
Date:  02/11/2023 Time:  4:01 PM  Group Topic/Focus:  Goals Group:   The focus of this group is to help patients establish daily goals to achieve during treatment and discuss how the patient can incorporate goal setting into their daily lives to aide in recovery.    Participation Level:  Minimal  Participation Quality:  Attentive  Affect:  Appropriate  Cognitive:  Appropriate  Insight: Appropriate  Engagement in Group:  Lacking  Modes of Intervention:  Discussion  Additional Comments:     Reymundo Poll 02/11/2023, 4:01 PM

## 2023-02-12 DIAGNOSIS — F1494 Cocaine use, unspecified with cocaine-induced mood disorder: Secondary | ICD-10-CM | POA: Diagnosis not present

## 2023-02-12 NOTE — Progress Notes (Signed)
Per NP request, patient was given prn tylenol  650 mg for complaint of headaches, benadryl 25 mg, and haldol 5 mg for complaint of visual hallucination of seeing shadows, at 1049. Patient tolerated medication well with no side effect noted, no acute distress noted at this time. Staff will continue provide support to patient.

## 2023-02-12 NOTE — [facility] Group Notes (Signed)
The focus of this group is to help patients establish daily goals to achieve during treatment and discuss how the patient can incorporate goal setting into their daily lives to aide in recovery.     Scale 1-10  7 out of 10        Goal Talk to case worker

## 2023-02-12 NOTE — Group Note (Signed)
Recreation Therapy Group Note   Group Topic:Relaxation  Group Date: 02/12/2023 Start Time: 4098 End Time: 1015 Facilitators: Aviendha Azbell-McCall, LRT,CTRS Location: [address] Dayroom   Goal Area(s) Addresses:  Patient will identify positive stress management techniques. Patient will identify benefits of using stress management post d/c.  Group Description: Meditation.  LRT and patients discussed the importance of taking time to release stress and clear the mind. LRT played a meditation that walked patients through breathing techniques and ways to refocus on positive feelings and mindset.    Affect/Mood: Appropriate   Participation Level: Engaged   Participation Quality: Independent   Behavior: Appropriate   Speech/Thought Process: Focused   Insight: Good   Judgement: Good   Modes of Intervention: Meditation   Patient Response to Interventions:  Engaged   Education Outcome:  Acknowledges education   Clinical Observations/Individualized Feedback: Pt attended and participated in group session.     Plan: Continue to engage patient in RT group sessions 2-3x/week.   Aidan Moten-McCall, LRT,CTRS 02/12/2023 11:51 AM

## 2023-02-12 NOTE — Progress Notes (Signed)
   02/12/23 2240  Psych Admission Type (Psych Patients Only)  Admission Status Voluntary  Psychosocial Assessment  Patient Complaints Anxiety;Depression  Eye Contact Fair  Facial Expression Flat  Affect Appropriate to circumstance  Speech Logical/coherent  Interaction Assertive  Motor Activity Other (Comment) (WNL)  Appearance/Hygiene Unremarkable  Behavior Characteristics Cooperative;Appropriate to situation  Mood Depressed;Anxious  Thought Process  Coherency WDL  Content WDL  Delusions None reported or observed  Perception WDL  Hallucination None reported or observed  Judgment Limited  Confusion None  Danger to Self  Current suicidal ideation? Denies  Agreement Not to Harm Self Yes  Description of Agreement verbal  Danger to Others  Danger to Others None reported or observed

## 2023-02-12 NOTE — Progress Notes (Signed)
   02/12/23 0556  15 Minute Checks  Location Bedroom  Visual Appearance Calm  Behavior Composed  Sleep (Behavioral Health Patients Only)  Calculate sleep? (Click Yes once per 24 hr at 0600 safety check) Yes  Documented sleep last 24 hours 7.75

## 2023-02-12 NOTE — Progress Notes (Signed)
   02/12/23 0800  Psych Admission Type (Psych Patients Only)  Admission Status Voluntary  Psychosocial Assessment  Patient Complaints Anxiety;Depression  Eye Contact Fair  Facial Expression Flat  Affect Appropriate to circumstance  Speech Logical/coherent  Interaction Assertive  Motor Activity Other (Comment) (WDL)  Appearance/Hygiene Unremarkable  Behavior Characteristics Cooperative;Appropriate to situation  Mood Depressed;Anxious  Thought Process  Coherency WDL  Content WDL  Delusions None reported or observed  Perception WDL  Hallucination None reported or observed  Judgment Limited  Confusion None  Danger to Self  Current suicidal ideation? Denies  Agreement Not to Harm Self Yes  Description of Agreement Verbally  Danger to Others  Danger to Others None reported or observed

## 2023-02-12 NOTE — BHH Counselor (Signed)
Adult Psychoeducational Group Note  Date:  02/12/2023 Time:  9:22 PM  Group Topic/Focus:  Wrap-Up Group:   The focus of this group is to help patients review their daily goal of treatment and discuss progress on daily workbooks.  Participation Level:  Active  Participation Quality:  Appropriate  Affect:  Appropriate  Cognitive:  Appropriate  Insight: Improving  Engagement in Group:  Engaged  Modes of Intervention:  Discussion  Additional Comments:  Pt attended and participated in group.  Maura Crandall Cassandra 02/12/2023, 9:22 PM

## 2023-02-12 NOTE — Plan of Care (Signed)

## 2023-02-12 NOTE — Progress Notes (Signed)
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 He is known in this [facility] & the [facility] for complain of worsening symptoms of depression, drug use & or suicidal ideations. Chart review reports indicated that patient has been seen at the other psychiatric hospitals within the surrounding areas with similar complaints. He is admitted to the [facility] this time around with complaint of suicidal ideations with plan to over dose on crack cocaine that did not completely work. His UDS was positive for cocaine. After medical evaluation/stabilization & clearance, he was transferred to the [facility] for further psychiatric evaluation & treatments   Psychiatry team treatment recommendation recommendations:  --Continue Zoloft 50 mg p.o. daily for depression and anxiety -- Continue hydroxyzine 25 mg p.o. 3 times daily as needed for anxiety -- Continue trazodone 50 mg p.o. q. nightly as needed for insomnia  On assessment today, Richard Moon presents alert, oriented & aware of situation. He is visible on the unit, attending group sessions. He reports, "I'm still feeling depressed & anxious. I'm still working on a place to go to after discharge. I have been calling the sober living of [place], no one was answering the phone. However, the only one that someone picked-up the phone told me that I will be responsible for my own food. I asked them, you mean I will buying my own food? They said yes. That doesn't even make sense. I have been seeing shadows all through the night & now. When I see the shadows, here comes this bad headaches. I slept well last night". Patient currently  denies any SIHI, AH, delusional thoughts or paranoia. He does not appear to be responding to any internal stimuli. Patient is instructed to ask his nurse for something for headaches & for the psychosis. He is instructed to continue to call the [place] houses for accommodation after discharge. Patient is reminded that most of this sober-living homes do charge rent as well. Continue current plan of care as already in progress. Patient's target discharge date is on Monday 02-15-23.  Discussed the following psychosocial stressors: Attending therapeutic milieu and unit group activities which could improve his mood. Per LCSW group therapy note, patient was observed to be minimally participating in group therapy with the topic: Who Am I?  Also provided education on cessation of polysubstance use these, as these has negative effects on overall psychiatric and medical wellbeing.  Plan is to discharge patient to [facility], [facility] or [facility].  Patient currently using the resources provided by LCSW to make phone calls to facilities for accommodation pending discharge.  Total Time spent with patient: 45 minutes  Past Psychiatric History: Previous Psych Diagnoses: MDD, stimulant use disorder Prior inpatient treatment: Multiple psychiatric inpatient treatment Current/prior outpatient treatment: Denies Prior rehab hx: Yes multiple times Psychotherapy hx: Yes History of suicide: Yes, multiple times.  Recently attempted to overdose on crack cocaine. History of homicide or aggression: Denies Psychiatric medication history: Yes,, trial of medication on Zoloft and hydroxyzine Psychiatric medication compliance history: Noncompliance Neuromodulation history: Denies Current Psychiatrist: Denies Current therapist: Denies  Past Medical History: History reviewed. No pertinent past medical history.  Past Surgical History:  Procedure Laterality Date   HERNIA  REPAIR     Family History: History reviewed. No pertinent  family history.  Family Psychiatric  History: See H&P  Social History:  Social History   Substance and Sexual Activity  Alcohol Use Yes   Comment: Occasional alcohol use     Social History   Substance and Sexual Activity  Drug Use Yes   Types: Marijuana, Cocaine, Heroin   Comment: daily cocaine use (reported $200-300/ daily); occasional cannabis use    Social History   Socioeconomic History   Marital status: Single    Spouse name: Not on file   Number of children: Not on file   Years of education: Not on file   Highest education level: Not on file  Occupational History   Not on file  Tobacco Use   Smoking status: Some Days    Current packs/day: 0.25    Average packs/day: 0.3 packs/day for 20.0 years (5.0 ttl pk-yrs)    Types: Cigarettes   Smokeless tobacco: Never  Vaping Use   Vaping status: Every Day  Substance and Sexual Activity   Alcohol use: Yes    Comment: Occasional alcohol use   Drug use: Yes    Types: Marijuana, Cocaine, Heroin    Comment: daily cocaine use (reported $200-300/ daily); occasional cannabis use   Sexual activity: Yes    Birth control/protection: Condom  Other Topics Concern   Not on file  Social History Narrative   Not on file   Social Determinants of Health   Financial Resource Strain: Not on file  Food Insecurity: Patient Declined (02/08/2023)   Hunger Vital Sign    Worried About Running Out of Food in the Last Year: Patient declined    Ran Out of Food in the Last Year: Patient declined  Transportation Needs: Unknown (02/08/2023)   PRAPARE - Administrator, Civil Service (Medical): Patient declined    Lack of Transportation (Non-Medical): No  Physical Activity: Not on file  Stress: Not on file  Social Connections: Not on file   Additional Social History:     Sleep: Good  Appetite:  Good  Current Medications: Current Facility-Administered Medications  Medication Dose Route Frequency Provider Last Rate Last Admin    acetaminophen (TYLENOL) tablet 650 mg  650 mg Oral Q6H PRN Massengill, Harrold Donath, MD   650 mg at 02/12/23 1049   alum & mag hydroxide-simeth (MAALOX/MYLANTA) 200-200-20 MG/5ML suspension 30 mL  30 mL Oral Q4H PRN Massengill, Harrold Donath, MD   30 mL at 02/11/23 2122   diphenhydrAMINE (BENADRYL) capsule 50 mg  50 mg Oral TID PRN Phineas Inches, MD   50 mg at 02/12/23 1050   Or   diphenhydrAMINE (BENADRYL) injection 50 mg  50 mg Intramuscular TID PRN Massengill, Harrold Donath, MD       feeding supplement (ENSURE ENLIVE / ENSURE PLUS) liquid 237 mL  237 mL Oral BID BM Massengill, Nathan, MD   237 mL at 02/12/23 1631   haloperidol (HALDOL) tablet 5 mg  5 mg Oral TID PRN Phineas Inches, MD   5 mg at 02/12/23 1050   Or   haloperidol lactate (HALDOL) injection 5 mg  5 mg Intramuscular TID PRN Massengill, Harrold Donath, MD       hydrOXYzine (ATARAX) tablet 25 mg  25 mg Oral TID Phineas Inches, MD   25 mg at 02/12/23 1052   LORazepam (ATIVAN) tablet 2 mg  2 mg Oral TID PRN Phineas Inches, MD       Or   LORazepam (ATIVAN) injection  2 mg  2 mg Intramuscular TID PRN Massengill, Harrold Donath, MD       magnesium hydroxide (MILK OF MAGNESIA) suspension 30 mL  30 mL Oral Daily PRN Massengill, Harrold Donath, MD       sertraline (ZOLOFT) tablet 50 mg  50 mg Oral Daily Ntuen, Tina C, FNP   50 mg at 02/12/23 0810   traZODone (DESYREL) tablet 50 mg  50 mg Oral QHS PRN Phineas Inches, MD   50 mg at 02/10/23 2148    Lab Results:  No results found for this or any previous visit (from the past 48 hour(s)).  Blood Alcohol level:  Lab Results  Component Value Date   ETH <10 02/06/2023   ETH <10 12/04/2021    Metabolic Disorder Labs: Lab Results  Component Value Date   HGBA1C 6.0 (H) 02/09/2023   MPG 125.5 02/09/2023   MPG 119.76 04/02/2021   No results found for: "PROLACTIN" Lab Results  Component Value Date   CHOL 174 02/09/2023   TRIG 148 02/09/2023   HDL 79 02/09/2023   CHOLHDL 2.2 02/09/2023   VLDL 30  02/09/2023   LDLCALC 65 02/09/2023   LDLCALC 99 04/02/2021    Physical Findings: AIMS: Facial and Oral Movements Muscles of Facial Expression: None, normal Lips and Perioral Area: None, normal Jaw: None, normal Tongue: None, normal,Extremity Movements Upper (arms, wrists, hands, fingers): None, normal Lower (legs, knees, ankles, toes): None, normal, Trunk Movements Neck, shoulders, hips: None, normal, Overall Severity Severity of abnormal movements (highest score from questions above): None, normal Incapacitation due to abnormal movements: None, normal Patient's awareness of abnormal movements (rate only patient's report): No Awareness, Dental Status Current problems with teeth and/or dentures?: No Does patient usually wear dentures?: No  CIWA:    COWS:     Musculoskeletal: Strength & Muscle Tone: within normal limits Gait & Station: normal Patient leans: N/A  Psychiatric Specialty Exam:  Presentation  General Appearance:  Casual  Eye Contact: Fair  Speech: Clear and Coherent; Normal Rate  Speech Volume: Normal  Handedness: Right  Mood and Affect  Mood: Anxious; Depressed  Affect: Congruent  Thought Process  Thought Processes: Coherent  Descriptions of Associations:Intact  Orientation:Full (Time, Place and Person)  Thought Content:Rumination  History of Schizophrenia/Schizoaffective disorder:No  Duration of Psychotic Symptoms:No data recorded Hallucinations:Hallucinations: Visual Description of Visual Hallucinations: Reports seeing shadows  Ideas of Reference:None  Suicidal Thoughts:Suicidal Thoughts: Yes, Passive SI Active Intent and/or Plan: -- (n/a) SI Passive Intent and/or Plan: Without Intent; Without Means to Carry Out; Without Access to Means  Homicidal Thoughts:Homicidal Thoughts: No  Sensorium  Memory: Immediate Fair; Recent Fair  Judgment: Poor  Insight: Poor  Executive Functions  Concentration: Fair  Attention  Span: Fair  Recall: [employer] of Knowledge: Fair  Language: Fair  Psychomotor Activity  Psychomotor Activity: Psychomotor Activity: Normal  Assets  Assets: Communication Skills; Desire for Improvement; Physical Health; Resilience  Sleep  Sleep: Sleep: Good Number of Hours of Sleep: 7  Physical Exam: Physical Exam Vitals and nursing note reviewed.  HENT:     Head: Normocephalic.     Nose: Nose normal.     Mouth/Throat:     Mouth: Mucous membranes are moist.  Eyes:     Pupils: Pupils are equal, round, and reactive to light.  Cardiovascular:     Rate and Rhythm: Normal rate.     Pulses: Normal pulses.  Pulmonary:     Effort: Pulmonary effort is normal.  Abdominal:  Comments: Deferred  Genitourinary:    Comments: Deferred Musculoskeletal:        General: Normal range of motion.     Cervical back: Normal range of motion.  Skin:    General: Skin is warm.  Neurological:     Mental Status: He is alert and oriented to person, place, and time.  Psychiatric:        Behavior: Behavior normal.    Review of Systems  Constitutional:  Negative for chills and fever.  HENT:  Negative for sore throat.   Eyes:  Negative for blurred vision.  Respiratory:  Negative for cough, shortness of breath and wheezing.   Cardiovascular:  Negative for chest pain and palpitations.  Gastrointestinal:  Negative for abdominal pain, heartburn, nausea and vomiting.  Genitourinary: Negative.   Musculoskeletal: Negative.   Skin:  Negative for itching and rash.  Neurological:  Positive for headaches. Negative for dizziness and tingling.  Endo/Heme/Allergies:        See allergy listing  Psychiatric/Behavioral:  Positive for depression, hallucinations and suicidal ideas. The patient is nervous/anxious.    Blood pressure (!) 126/91, pulse 86, temperature 98.4 F (36.9 C), temperature source Oral, resp. rate 18, height 5\' 10"  (1.778 m), weight 62.1 kg, SpO2 98%. Body mass index is 19.66  kg/m.  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 daily for depression/anxiety -- Continue hydroxyzine 25 mg po tid prn for anxiety -- Continue trazodone 50 mg po q hs for insomnia   Agitation protocol: Benadryl capsule 50 mg p.o. or IM 3 times daily as needed agitation   Haldol tablets 5 mg po IM 3 times daily as needed agitation   Lorazepam tablet 2 mg p.o. or IM 3 times daily as needed agitation     Other PRN Medications -Acetaminophen 650 mg every 6 as needed/mild pain -Maalox 30 mL oral every 4 as needed/digestion -Magnesium hydroxide 30 mL daily as needed/mild constipation   -- The risks/benefits/side-effects/alternatives to this medication were discussed in detail with the patient and time was given for questions. The patient consents to medication trial.              -- Encouraged patient to participate  in unit milieu and in scheduled group therapies    Labs: CMP: Creatinine 1.38 high, AST 54 high, estimated GFR 59 high.  CBC with differential: RBC 4.21 low, MCV 101.2 high.  UDS: Positive for cocaine.   Labs ordered: Lipid panel, hemoglobin A1c.  Awaiting results at this time. 02/10/23: Lipid panel: WNL, hemoglobin A1c: 6.0 high   EKG: Sinus rhythm with frequent premature ventricular complexes, ventricular rate 71, QT/QTc 384/417   Safety and Monitoring: Voluntary admission to inpatient psychiatric unit for safety, stabilization and treatment Daily contact with patient to assess and evaluate symptoms and progress in treatment Patient's case to be discussed in multi-disciplinary team meeting Observation Level : q15 minute checks Vital signs: q12 hours Precautions: suicide, but pt  currently verbally contracts for safety on unit    Discharge Planning: Social work and case management to assist with discharge planning and identification of hospital follow-up needs prior to discharge Estimated LOS: 5-7 days Discharge Concerns: Need to establish a safety plan; Medication compliance and effectiveness Discharge Goals: Return home with outpatient referrals  for mental health follow-up including medication management/psychotherapy.     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    Armandina Stammer, NP, pmhnp, fnp-bc. 02/12/2023, 4:44 PM Patient ID: Richard Moon, male   DOB: January 25, 1961, 62 y.o.   MRN: [id number] Patient ID: Richard Moon, male   DOB: 1960-10-20, 62 y.o.   MRN: [id number]

## 2023-02-12 NOTE — Progress Notes (Signed)
   02/11/23 2122  Psych Admission Type (Psych Patients Only)  Admission Status Voluntary  Psychosocial Assessment  Patient Complaints Anxiety;Depression;Worrying  Eye Contact Fair  Facial Expression Flat  Affect Appropriate to circumstance;Depressed  Speech Logical/coherent  Interaction Assertive  Motor Activity Other (Comment) (WDL)  Appearance/Hygiene Unremarkable  Behavior Characteristics Cooperative;Appropriate to situation  Mood Depressed;Anxious;Pleasant  Thought Process  Coherency WDL  Content WDL  Delusions None reported or observed  Perception WDL  Hallucination Visual ("shadows")  Judgment Limited  Confusion None  Danger to Self  Current suicidal ideation? Denies  Agreement Not to Harm Self Yes  Description of Agreement verbally contracts for safety  Danger to Others  Danger to Others None reported or observed

## 2023-02-13 DIAGNOSIS — F1494 Cocaine use, unspecified with cocaine-induced mood disorder: Secondary | ICD-10-CM | POA: Diagnosis not present

## 2023-02-13 NOTE — Plan of Care (Signed)
  Problem: Education: Goal: Knowledge of [facility] Education information/materials will improve Outcome: Progressing Goal: Emotional status will improve Outcome: Progressing Goal: Mental status will improve Outcome: Progressing Goal: Verbalization of understanding the information provided will improve Outcome: Progressing   

## 2023-02-13 NOTE — [facility] Group Notes (Signed)
LCSW Wellness Group Note   02/13/2023 1:00pm  Type of Group and Topic: Psychoeducational Group:  Wellness  Participation Level:  did not attend  Description of Group  Wellness group introduces the topic and its focus on developing healthy habits across the spectrum and its relationship to a decrease in hospital admissions.  Six areas of wellness are discussed: physical, social spiritual, intellectual, occupational, and emotional.  Patients are asked to consider their current wellness habits and to identify areas of wellness where they are interested and able to focus on improvements.    Therapeutic Goals Patients will understand components of wellness and how they can positively impact overall health.  Patients will identify areas of wellness where they have developed good habits. Patients will identify areas of wellness where they would like to make improvements.    Summary of Patient Progress     Therapeutic Modalities: Cognitive Behavioral Therapy Psychoeducation    Lorri Frederick, LCSW

## 2023-02-13 NOTE — [facility] Group Notes (Signed)
[facility] Group Notes:  (Nursing/MHT/Case Management/Adjunct)  Date:  02/13/2023  Time: 1400  Type of Therapy:  Nurse Education  Participation Level:  Did Not Attend   Shela Nevin 02/13/2023, 7:15 PM

## 2023-02-13 NOTE — Progress Notes (Signed)
   02/13/23 0900  Psych Admission Type (Psych Patients Only)  Admission Status Voluntary  Psychosocial Assessment  Patient Complaints Depression  Eye Contact Fair  Facial Expression Sad  Affect Appropriate to circumstance  Speech Logical/coherent  Interaction Assertive  Motor Activity Other (Comment) (steady gait)  Appearance/Hygiene Unremarkable  Behavior Characteristics Cooperative;Appropriate to situation  Mood Depressed  Thought Process  Coherency WDL  Content WDL  Delusions None reported or observed  Perception WDL  Hallucination None reported or observed  Judgment Limited  Confusion None  Danger to Self  Current suicidal ideation? Passive  Self-Injurious Behavior No self-injurious ideation or behavior indicators observed or expressed   Agreement Not to Harm Self Yes  Description of Agreement agreed to contact staff before acting on harmful thoughts  Danger to Others  Danger to Others None reported or observed

## 2023-02-13 NOTE — Progress Notes (Signed)
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 He is known in this [facility] & the [facility] for complain of worsening symptoms of depression, drug use & or suicidal ideations. Chart review reports indicated that patient has been seen at the other psychiatric hospitals within the surrounding areas with similar complaints. He is admitted to the [facility] this time around with complaint of suicidal ideations with plan to over dose on crack cocaine that did not completely work. His UDS was positive for cocaine. After medical evaluation/stabilization & clearance, he was transferred to the [facility] for further psychiatric evaluation & treatments   Psychiatry team treatment recommendation recommendations:  --Continue Zoloft 50 mg p.o. daily for depression and anxiety -- Continue hydroxyzine 25 mg p.o. 3 times daily as needed for anxiety -- Continue trazodone 50 mg p.o. q. nightly as needed for insomnia  On assessment today, Viktor is seen, chart reviewed. The chart findings discussed with the treatment team. He presents alert, oriented & aware of situation. He is visible on the unit, attending group sessions. He reports, "I'm still feeling restless, worried about where to go after discharge. [facility] like [facility]'m suppose [facility]. [facility] that [facility] the [facility] wants my SW to call them back. I have not heard nothing from the SW yet". Dhruvan is taking & tolerating his treatment regimen. Denies any side effects. He currently denies any SIHI, AVH, delusional thoughts or paranoia. He does not appear to be responding to any internal stimuli. At  this time, patient may be approaching his baseline. Patient's target discharge date is on Monday 02-15-23.  Plan: Is to discharge patient to [facility], [facility] or [facility].  Patient currently using the resources provided by LCSW to make phone calls to facilities for accommodation pending discharge.  Total Time spent with patient:  35 minutes  Past Psychiatric History: Previous Psych Diagnoses: MDD, stimulant use disorder Prior inpatient treatment: Multiple psychiatric inpatient treatment Current/prior outpatient treatment: Denies Prior rehab hx: Yes multiple times Psychotherapy hx: Yes History of suicide: Yes, multiple times.  Recently attempted to overdose on crack cocaine. History of homicide or aggression: Denies Psychiatric medication history: Yes,, trial of medication on Zoloft and hydroxyzine Psychiatric medication compliance history: Noncompliance Neuromodulation history: Denies Current Psychiatrist: Denies Current therapist: Denies  Past Medical History: History reviewed. No pertinent past medical history.  Past Surgical History:  Procedure Laterality Date   HERNIA REPAIR     Family History: History reviewed. No pertinent family history.  Family Psychiatric  History: See H&P  Social History:  Social History   Substance and Sexual Activity  Alcohol Use Yes   Comment: Occasional alcohol use     Social History   Substance and Sexual Activity  Drug Use Yes   Types: Marijuana, Cocaine, Heroin   Comment: daily cocaine use (reported $200-300/ daily); occasional cannabis use    Social History   Socioeconomic History   Marital status: Single    Spouse name: Not on file   Number of children: Not on file   Years of education: Not on file   Highest education level: Not on file  Occupational History   Not on  file  Tobacco Use   Smoking status: Some Days    Current packs/day: 0.25    Average packs/day: 0.3 packs/day for 20.0 years (5.0 ttl pk-yrs)    Types: Cigarettes    Smokeless tobacco: Never  Vaping Use   Vaping status: Every Day  Substance and Sexual Activity   Alcohol use: Yes    Comment: Occasional alcohol use   Drug use: Yes    Types: Marijuana, Cocaine, Heroin    Comment: daily cocaine use (reported $200-300/ daily); occasional cannabis use   Sexual activity: Yes    Birth control/protection: Condom  Other Topics Concern   Not on file  Social History Narrative   Not on file   Social Determinants of Health   Financial Resource Strain: Not on file  Food Insecurity: Patient Declined (02/08/2023)   Hunger Vital Sign    Worried About Running Out of Food in the Last Year: Patient declined    Ran Out of Food in the Last Year: Patient declined  Transportation Needs: Unknown (02/08/2023)   PRAPARE - Administrator, Civil Service (Medical): Patient declined    Lack of Transportation (Non-Medical): No  Physical Activity: Not on file  Stress: Not on file  Social Connections: Not on file   Additional Social History:     Sleep: Good  Appetite:  Good  Current Medications: Current Facility-Administered Medications  Medication Dose Route Frequency Provider Last Rate Last Admin   acetaminophen (TYLENOL) tablet 650 mg  650 mg Oral Q6H PRN Massengill, Harrold Donath, MD   650 mg at 02/12/23 1049   alum & mag hydroxide-simeth (MAALOX/MYLANTA) 200-200-20 MG/5ML suspension 30 mL  30 mL Oral Q4H PRN Massengill, Harrold Donath, MD   30 mL at 02/11/23 2122   diphenhydrAMINE (BENADRYL) capsule 50 mg  50 mg Oral TID PRN Phineas Inches, MD   50 mg at 02/12/23 1050   Or   diphenhydrAMINE (BENADRYL) injection 50 mg  50 mg Intramuscular TID PRN Massengill, Harrold Donath, MD       feeding supplement (ENSURE ENLIVE / ENSURE PLUS) liquid 237 mL  237 mL Oral BID BM Massengill, Harrold Donath, MD   237 mL at 02/13/23 1055   haloperidol (HALDOL) tablet 5 mg  5 mg Oral TID PRN Phineas Inches, MD   5 mg at 02/12/23 1050   Or   haloperidol lactate (HALDOL) injection 5 mg  5 mg  Intramuscular TID PRN Phineas Inches, MD       hydrOXYzine (ATARAX) tablet 25 mg  25 mg Oral TID Phineas Inches, MD   25 mg at 02/13/23 0825   LORazepam (ATIVAN) tablet 2 mg  2 mg Oral TID PRN Phineas Inches, MD       Or   LORazepam (ATIVAN) injection 2 mg  2 mg Intramuscular TID PRN Massengill, Harrold Donath, MD       magnesium hydroxide (MILK OF MAGNESIA) suspension 30 mL  30 mL Oral Daily PRN Massengill, Nathan, MD       sertraline (ZOLOFT) tablet 50 mg  50 mg Oral Daily Ntuen, Tina C, FNP   50 mg at 02/13/23 0825   traZODone (DESYREL) tablet 50 mg  50 mg Oral QHS PRN Phineas Inches, MD   50 mg at 02/12/23 2150    Lab Results:  No results found for this or any previous visit (from the past 48 hour(s)).  Blood Alcohol level:  Lab Results  Component Value Date   ETH <10 02/06/2023   ETH <10 12/04/2021    Metabolic  Disorder Labs: Lab Results  Component Value Date   HGBA1C 6.0 (H) 02/09/2023   MPG 125.5 02/09/2023   MPG 119.76 04/02/2021   No results found for: "PROLACTIN" Lab Results  Component Value Date   CHOL 174 02/09/2023   TRIG 148 02/09/2023   HDL 79 02/09/2023   CHOLHDL 2.2 02/09/2023   VLDL 30 02/09/2023   LDLCALC 65 02/09/2023   LDLCALC 99 04/02/2021    Physical Findings: AIMS: Facial and Oral Movements Muscles of Facial Expression: None, normal Lips and Perioral Area: None, normal Jaw: None, normal Tongue: None, normal,Extremity Movements Upper (arms, wrists, hands, fingers): None, normal Lower (legs, knees, ankles, toes): None, normal, Trunk Movements Neck, shoulders, hips: None, normal, Overall Severity Severity of abnormal movements (highest score from questions above): None, normal Incapacitation due to abnormal movements: None, normal Patient's awareness of abnormal movements (rate only patient's report): No Awareness, Dental Status Current problems with teeth and/or dentures?: No Does patient usually wear dentures?: No  CIWA:    COWS:      Musculoskeletal: Strength & Muscle Tone: within normal limits Gait & Station: normal Patient leans: N/A  Psychiatric Specialty Exam:  Presentation  General Appearance:  Casual; Fairly Groomed  Eye Contact: Fair  Speech: Clear and Coherent; Normal Rate  Speech Volume: Normal  Handedness: Right  Mood and Affect  Mood: -- (Improving)  Affect: Congruent  Thought Process  Thought Processes: Coherent; Goal Directed; Linear  Descriptions of Associations:Intact  Orientation:Full (Time, Place and Person)  Thought Content:Logical  History of Schizophrenia/Schizoaffective disorder:No  Duration of Psychotic Symptoms:No data recorded Hallucinations:Hallucinations: None Description of Visual Hallucinations: NA   Ideas of Reference:None  Suicidal Thoughts:Suicidal Thoughts: No SI Active Intent and/or Plan: Without Intent; Without Plan; Without Means to Carry Out; Without Access to Means SI Passive Intent and/or Plan: Without Intent; Without Plan; Without Means to Carry Out; Without Access to Means   Homicidal Thoughts:Homicidal Thoughts: No   Sensorium  Memory: Immediate Good; Recent Good; Remote Good  Judgment: Fair  Insight: Fair  Art therapist  Concentration: Good  Attention Span: Good  Recall: Good  Fund of Knowledge: Fair  Language: Good  Psychomotor Activity  Psychomotor Activity: No data recorded  Assets  Assets: Communication Skills; Desire for Improvement; Physical Health; Resilience; Social Support  Sleep  Sleep: Sleep: Good Number of Hours of Sleep: 7  Physical Exam: Physical Exam Vitals and nursing note reviewed.  HENT:     Head: Normocephalic.     Nose: Nose normal.     Mouth/Throat:     Mouth: Mucous membranes are moist.  Eyes:     Pupils: Pupils are equal, round, and reactive to light.  Cardiovascular:     Rate and Rhythm: Normal rate.     Pulses: Normal pulses.  Pulmonary:     Effort: Pulmonary  effort is normal.  Genitourinary:    Comments: Deferred Musculoskeletal:        General: Normal range of motion.     Cervical back: Normal range of motion.  Skin:    General: Skin is warm.  Neurological:     General: No focal deficit present.     Mental Status: He is alert and oriented to person, place, and time.  Psychiatric:        Behavior: Behavior normal.    Review of Systems  Constitutional:  Negative for chills and fever.  HENT:  Negative for sore throat.   Eyes:  Negative for blurred vision.  Respiratory:  Negative for cough,  shortness of breath and wheezing.   Cardiovascular:  Negative for chest pain and palpitations.  Gastrointestinal:  Negative for abdominal pain, heartburn, nausea and vomiting.  Genitourinary: Negative.   Musculoskeletal: Negative.   Skin:  Negative for itching and rash.  Neurological:  Positive for headaches. Negative for dizziness and tingling.  Endo/Heme/Allergies:        See allergy listing  Psychiatric/Behavioral:  Positive for depression, hallucinations and suicidal ideas. The patient is nervous/anxious.    Blood pressure 102/81, pulse 87, temperature 98.1 F (36.7 C), temperature source Oral, resp. rate 18, height 5\' 10"  (1.778 m), weight 62.1 kg, SpO2 98%. Body mass index is 19.66 kg/m.  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 daily for depression/anxiety -- Continue hydroxyzine 25 mg po tid prn for anxiety -- Continue trazodone 50 mg po q hs for insomnia   Agitation protocol: Benadryl capsule 50 mg p.o. or IM 3 times daily as needed agitation   Haldol tablets 5 mg po IM 3 times daily as needed agitation   Lorazepam tablet 2 mg p.o. or IM 3 times  daily as needed agitation     Other PRN Medications -Acetaminophen 650 mg every 6 as needed/mild pain -Maalox 30 mL oral every 4 as needed/digestion -Magnesium hydroxide 30 mL daily as needed/mild constipation   -- The risks/benefits/side-effects/alternatives to this medication were discussed in detail with the patient and time was given for questions. The patient consents to medication trial.              -- Encouraged patient to participate  in unit milieu and in scheduled group therapies    Labs: CMP: Creatinine 1.38 high, AST 54 high, estimated GFR 59 high.  CBC with differential: RBC 4.21 low, MCV 101.2 high.  UDS: Positive for cocaine.   Labs ordered: Lipid panel, hemoglobin A1c.  Awaiting results at this time. 02/10/23: Lipid panel: WNL, hemoglobin A1c: 6.0 high   EKG: Sinus rhythm with frequent premature ventricular complexes, ventricular rate 71, QT/QTc 384/417   Safety and Monitoring: Voluntary admission to inpatient psychiatric unit for safety, stabilization and treatment Daily contact with patient to assess and evaluate symptoms and progress in treatment Patient's case to be discussed in multi-disciplinary team meeting Observation Level : q15 minute checks Vital signs: q12 hours Precautions: suicide, but pt currently verbally contracts for safety on unit    Discharge Planning: Social work and case management to assist with discharge planning and identification of hospital follow-up needs prior to discharge Estimated LOS: 5-7 days Discharge Concerns: Need to establish a safety plan; Medication compliance and effectiveness Discharge Goals: Return home with outpatient referrals for mental health follow-up including medication management/psychotherapy.     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    Armandina Stammer, NP, pmhnp, fnp-bc. 02/13/2023, 1:20  PM Patient ID: [name], male   DOB: Aug 13, 1960, 62 y.o.   MRN: [id number] Patient ID: Casyn Torrence, male   DOB: 16-Jan-1961, 62 y.o.   MRN: [id number] Patient ID: Yovani Macfarland, male   DOB: 23-Feb-1961, 62 y.o.   MRN: [id number]

## 2023-02-13 NOTE — [facility] Group Notes (Signed)
[facility] Group Notes:  (Nursing/MHT/Case Management/Adjunct)  Date:  02/13/2023  Time: 2015 Type of Therapy:   Wrap up group  Participation Level:  Active  Participation Quality:  Appropriate, Attentive, Sharing, and Supportive  Affect:  Depressed and Flat  Cognitive:  Alert  Insight:  Improving  Engagement in Group:  Engaged  Modes of Intervention:  Clarification, Education, and Socialization  Summary of Progress/Problems: Positive thinking and self-care were discussed.   Marcille Buffy 02/13/2023, 9:23 PM

## 2023-02-13 NOTE — [facility] Group Notes (Signed)
Pt did not attend goals group. 

## 2023-02-13 NOTE — Progress Notes (Signed)
D. Pt presented with a depressed affect /mood- reported feeling very groggy today-remained in bed for much of the morning. Pt complained of not being able to stay awake for groups, but did go outside for Rec therapy. . Pt continues to endorse passive SI, and VH, reported that he often sees shadows.  A. Labs and vitals monitored. Pt given and educated on medications. Pt supported emotionally and encouraged to express concerns and ask questions.   R. Pt remains safe with 15 minute checks. Will continue POC.

## 2023-02-13 NOTE — Plan of Care (Signed)
  Problem: Safety: Goal: Periods of time without injury will increase Outcome: Progressing   Problem: Activity: Goal: Sleeping patterns will improve Outcome: Progressing   Problem: Coping: Goal: Ability to demonstrate self-control will improve Outcome: Progressing

## 2023-02-14 DIAGNOSIS — F1494 Cocaine use, unspecified with cocaine-induced mood disorder: Secondary | ICD-10-CM | POA: Diagnosis not present

## 2023-02-14 NOTE — Plan of Care (Signed)
  Problem: Education: Goal: Knowledge of [place] [facility] Education information/materials will improve Outcome: Progressing Goal: Emotional status will improve Outcome: Progressing Goal: Mental status will improve Outcome: Progressing   

## 2023-02-14 NOTE — Plan of Care (Signed)
  Problem: Activity: Goal: Interest or engagement in activities will improve Outcome: Progressing   Problem: Activity: Goal: Sleeping patterns will improve Outcome: Progressing   

## 2023-02-14 NOTE — Progress Notes (Signed)
   02/14/23 0530  15 Minute Checks  Location Bedroom  Visual Appearance Calm  Behavior Composed  Sleep (Behavioral Health Patients Only)  Calculate sleep? (Click Yes once per 24 hr at 0600 safety check) Yes  Documented sleep last 24 hours 7.25

## 2023-02-14 NOTE — Progress Notes (Signed)
   02/14/23 0006  Psych Admission Type (Psych Patients Only)  Admission Status Voluntary  Psychosocial Assessment  Patient Complaints Depression  Eye Contact Fair  Facial Expression Animated  Affect Appropriate to circumstance  Speech Logical/coherent  Interaction Assertive  Motor Activity Other (Comment) (WDL)  Appearance/Hygiene Unremarkable  Behavior Characteristics Appropriate to situation  Mood Depressed;Pleasant  Thought Process  Coherency WDL  Content WDL  Delusions None reported or observed  Perception Hallucinations  Hallucination Visual  Judgment Impaired  Confusion None  Danger to Self  Current suicidal ideation? Denies  Self-Injurious Behavior No self-injurious ideation or behavior indicators observed or expressed   Agreement Not to Harm Self Yes  Description of Agreement verbal  Danger to Others  Danger to Others None reported or observed

## 2023-02-14 NOTE — Group Note (Signed)
Date:  02/14/2023 Time:  8:35 PM  Group Topic/Focus:  Goals Group:   The focus of this group is to help patients establish daily goals to achieve during treatment and discuss how the patient can incorporate goal setting into their daily lives to aide in recovery. Wrap-Up Group:   The focus of this group is to help patients review their daily goal of treatment and discuss progress on daily workbooks.    Participation Level:  Active  Participation Quality:  Sharing  Affect:  Appropriate  Cognitive:  Appropriate  Insight: Good  Engagement in Group:  Engaged  Modes of Intervention:  Discussion  Additional Comments:    Osker Mason 02/14/2023, 8:35 PM

## 2023-02-14 NOTE — Progress Notes (Signed)
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 He is known in this [facility] & the [facility] for complain of worsening symptoms of depression, drug use & or suicidal ideations. Chart review reports indicated that patient has been seen at the other psychiatric hospitals within the surrounding areas with similar complaints. He is admitted to the [facility] this time around with complaint of suicidal ideations with plan to over dose on crack cocaine that did not completely work. His UDS was positive for cocaine. After medical evaluation/stabilization & clearance, he was transferred to the [facility] for further psychiatric evaluation & treatments   Psychiatry team treatment recommendation recommendations:  --Continue Zoloft 50 mg p.o. daily for depression and anxiety -- Continue hydroxyzine 25 mg p.o. 3 times daily as needed for anxiety -- Continue trazodone 50 mg p.o. q. nightly as needed for insomnia  On assessment today, Richard Moon is seen in his room. Chart reviewed. The chart findings discussed with the treatment team. He presents alert, oriented & aware of situation. He is visible on the unit, attending group sessions. He reports, "I'm feeling & doing much better (emotionally & physically) today than I did yesterday. I slept well last night. I believe I was set to be discharged tomorrow. I think I'm ready to leave this hospital. I still do not know where I will be going. I still have not heard from my SW about which [facility] I will be going to after discharge. I need to see my SW. Richard Moon denies any side effects from his medications. He currently denies any  SIHI, AVH, delusional thoughts or paranoia. He does not appear to be responding to any internal stimuli. At this time, patient seems to have approached his baseline. He seems ready to be discharged tomorrow 02-15-23.  There are no changes made on his current plan of care. Continue as already in progress. Vital signs remain stable. His target discharge date is on Monday 02-15-23.   Total Time spent with patient:  35 minutes  Past Psychiatric History: Previous Psych Diagnoses: MDD, stimulant use disorder Prior inpatient treatment: Multiple psychiatric inpatient treatment Current/prior outpatient treatment: Denies Prior rehab hx: Yes multiple times Psychotherapy hx: Yes History of suicide: Yes, multiple times.  Recently attempted to overdose on crack cocaine. History of homicide or aggression: Denies Psychiatric medication history: Yes,, trial of medication on Zoloft and hydroxyzine Psychiatric medication compliance history: Noncompliance Neuromodulation history: Denies Current Psychiatrist: Denies Current therapist: Denies  Past Medical History: History reviewed. No pertinent past medical history.  Past Surgical History:  Procedure Laterality Date   HERNIA REPAIR     Family History: History reviewed. No pertinent family history.  Family Psychiatric  History: See H&P  Social History:  Social History   Substance and Sexual Activity  Alcohol Use Yes   Comment: Occasional alcohol use     Social History   Substance and Sexual Activity  Drug Use Yes   Types: Marijuana, Cocaine, Heroin   Comment: daily cocaine use (reported $200-300/ daily); occasional cannabis use    Social History   Socioeconomic History   Marital status: Single    Spouse name: Not on file   Number of children: Not  on file   Years of education: Not on file   Highest education level: Not on file  Occupational History   Not on file  Tobacco Use   Smoking status: Some Days    Current packs/day: 0.25     Average packs/day: 0.3 packs/day for 20.0 years (5.0 ttl pk-yrs)    Types: Cigarettes   Smokeless tobacco: Never  Vaping Use   Vaping status: Every Day  Substance and Sexual Activity   Alcohol use: Yes    Comment: Occasional alcohol use   Drug use: Yes    Types: Marijuana, Cocaine, Heroin    Comment: daily cocaine use (reported $200-300/ daily); occasional cannabis use   Sexual activity: Yes    Birth control/protection: Condom  Other Topics Concern   Not on file  Social History Narrative   Not on file   Social Determinants of Health   Financial Resource Strain: Not on file  Food Insecurity: Patient Declined (02/08/2023)   Hunger Vital Sign    Worried About Running Out of Food in the Last Year: Patient declined    Ran Out of Food in the Last Year: Patient declined  Transportation Needs: Unknown (02/08/2023)   PRAPARE - Administrator, Civil Service (Medical): Patient declined    Lack of Transportation (Non-Medical): No  Physical Activity: Not on file  Stress: Not on file  Social Connections: Not on file   Additional Social History:     Sleep: Good  Appetite:  Good  Current Medications: Current Facility-Administered Medications  Medication Dose Route Frequency Provider Last Rate Last Admin   acetaminophen (TYLENOL) tablet 650 mg  650 mg Oral Q6H PRN Massengill, Harrold Donath, MD   650 mg at 02/12/23 1049   alum & mag hydroxide-simeth (MAALOX/MYLANTA) 200-200-20 MG/5ML suspension 30 mL  30 mL Oral Q4H PRN Massengill, Harrold Donath, MD   30 mL at 02/11/23 2122   diphenhydrAMINE (BENADRYL) capsule 50 mg  50 mg Oral TID PRN Phineas Inches, MD   50 mg at 02/12/23 1050   Or   diphenhydrAMINE (BENADRYL) injection 50 mg  50 mg Intramuscular TID PRN Massengill, Harrold Donath, MD       feeding supplement (ENSURE ENLIVE / ENSURE PLUS) liquid 237 mL  237 mL Oral BID BM Massengill, Harrold Donath, MD   237 mL at 02/13/23 1605   haloperidol (HALDOL) tablet 5 mg  5 mg Oral TID PRN Phineas Inches,  MD   5 mg at 02/12/23 1050   Or   haloperidol lactate (HALDOL) injection 5 mg  5 mg Intramuscular TID PRN Phineas Inches, MD       hydrOXYzine (ATARAX) tablet 25 mg  25 mg Oral TID Phineas Inches, MD   25 mg at 02/14/23 0758   LORazepam (ATIVAN) tablet 2 mg  2 mg Oral TID PRN Phineas Inches, MD       Or   LORazepam (ATIVAN) injection 2 mg  2 mg Intramuscular TID PRN Massengill, Harrold Donath, MD       magnesium hydroxide (MILK OF MAGNESIA) suspension 30 mL  30 mL Oral Daily PRN Massengill, Nathan, MD       sertraline (ZOLOFT) tablet 50 mg  50 mg Oral Daily Ntuen, Tina C, FNP   50 mg at 02/14/23 0758   traZODone (DESYREL) tablet 50 mg  50 mg Oral QHS PRN Phineas Inches, MD   50 mg at 02/13/23 2055    Lab Results:  No results found for this or any previous visit (from the past 48 hour(s)).  Blood Alcohol level:  Lab Results  Component Value Date   ETH <10 02/06/2023   ETH <10 12/04/2021    Metabolic Disorder Labs: Lab Results  Component Value Date   HGBA1C 6.0 (H) 02/09/2023   MPG 125.5 02/09/2023   MPG 119.76 04/02/2021   No results found for: "PROLACTIN" Lab Results  Component Value Date   CHOL 174 02/09/2023   TRIG 148 02/09/2023   HDL 79 02/09/2023   CHOLHDL 2.2 02/09/2023   VLDL 30 02/09/2023   LDLCALC 65 02/09/2023   LDLCALC 99 04/02/2021    Physical Findings: AIMS: Facial and Oral Movements Muscles of Facial Expression: None, normal Lips and Perioral Area: None, normal Jaw: None, normal Tongue: None, normal,Extremity Movements Upper (arms, wrists, hands, fingers): None, normal Lower (legs, knees, ankles, toes): None, normal, Trunk Movements Neck, shoulders, hips: None, normal, Overall Severity Severity of abnormal movements (highest score from questions above): None, normal Incapacitation due to abnormal movements: None, normal Patient's awareness of abnormal movements (rate only patient's report): No Awareness, Dental Status Current problems with  teeth and/or dentures?: No Does patient usually wear dentures?: No  CIWA:    COWS:     Musculoskeletal: Strength & Muscle Tone: within normal limits Gait & Station: normal Patient leans: N/A  Psychiatric Specialty Exam:  Presentation  General Appearance:  Casual; Fairly Groomed  Eye Contact: Fair  Speech: Clear and Coherent; Normal Rate  Speech Volume: Normal  Handedness: Right  Mood and Affect  Mood: -- (Improving)  Affect: Congruent  Thought Process  Thought Processes: Coherent; Goal Directed; Linear  Descriptions of Associations:Intact  Orientation:Full (Time, Place and Person)  Thought Content:Logical  History of Schizophrenia/Schizoaffective disorder:No  Duration of Psychotic Symptoms:No data recorded Hallucinations:Hallucinations: None Description of Visual Hallucinations: NA   Ideas of Reference:None  Suicidal Thoughts:Suicidal Thoughts: No SI Active Intent and/or Plan: Without Intent; Without Plan; Without Means to Carry Out; Without Access to Means SI Passive Intent and/or Plan: Without Intent; Without Plan; Without Means to Carry Out; Without Access to Means   Homicidal Thoughts:Homicidal Thoughts: No   Sensorium  Memory: Immediate Good; Recent Good; Remote Good  Judgment: Fair  Insight: Fair  Art therapist  Concentration: Good  Attention Span: Good  Recall: Good  Fund of Knowledge: Fair  Language: Good  Psychomotor Activity  Psychomotor Activity: No data recorded  Assets  Assets: Communication Skills; Desire for Improvement; Physical Health; Resilience; Social Support  Sleep  Sleep: Sleep: Good Number of Hours of Sleep: 7  Physical Exam: Physical Exam Vitals and nursing note reviewed.  HENT:     Head: Normocephalic.     Nose: Nose normal.     Mouth/Throat:     Mouth: Mucous membranes are moist.  Eyes:     Pupils: Pupils are equal, round, and reactive to light.  Cardiovascular:     Rate and  Rhythm: Normal rate.     Pulses: Normal pulses.  Pulmonary:     Effort: Pulmonary effort is normal.  Genitourinary:    Comments: Deferred Musculoskeletal:        General: Normal range of motion.     Cervical back: Normal range of motion.  Skin:    General: Skin is warm.  Neurological:     General: No focal deficit present.     Mental Status: He is alert and oriented to person, place, and time.  Psychiatric:        Behavior: Behavior normal.   Review of Systems  Constitutional:  Negative for chills  and fever.  HENT:  Negative for sore throat.   Eyes:  Negative for blurred vision.  Respiratory:  Negative for cough, shortness of breath and wheezing.   Cardiovascular:  Negative for chest pain and palpitations.  Gastrointestinal:  Negative for abdominal pain, heartburn, nausea and vomiting.  Genitourinary: Negative.   Musculoskeletal: Negative.   Skin:  Negative for itching and rash.  Neurological:  Positive for headaches. Negative for dizziness and tingling.  Endo/Heme/Allergies:        See allergy listing  Psychiatric/Behavioral:  Positive for depression, hallucinations and suicidal ideas. The patient is nervous/anxious.    Blood pressure (!) 89/77, pulse 86, temperature 98.1 F (36.7 C), temperature source Oral, resp. rate 18, height 5\' 10"  (1.778 m), weight 62.1 kg, SpO2 98%. Body mass index is 19.66 kg/m.  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 daily for depression/anxiety -- Continue hydroxyzine 25 mg po tid prn for anxiety -- Continue trazodone 50 mg po q hs for insomnia   Agitation protocol: Benadryl capsule 50 mg p.o. or IM 3 times daily as needed agitation   Haldol tablets 5  mg po IM 3 times daily as needed agitation   Lorazepam tablet 2 mg p.o. or IM 3 times daily as needed agitation     Other PRN Medications -Acetaminophen 650 mg every 6 as needed/mild pain -Maalox 30 mL oral every 4 as needed/digestion -Magnesium hydroxide 30 mL daily as needed/mild constipation   -- The risks/benefits/side-effects/alternatives to this medication were discussed in detail with the patient and time was given for questions. The patient consents to medication trial.              -- Encouraged patient to participate  in unit milieu and in scheduled group therapies     Safety and Monitoring: Voluntary admission to inpatient psychiatric unit for safety, stabilization and treatment Daily contact with patient to assess and evaluate symptoms and progress in treatment Patient's case to be discussed in multi-disciplinary team meeting Observation Level : q15 minute checks Vital signs: q12 hours Precautions: suicide, but pt currently verbally contracts for safety on unit    Discharge Planning: Social work and case management to assist with discharge planning and identification of hospital follow-up needs prior to discharge Estimated LOS: 5-7 days Discharge Concerns: Need to establish a safety plan; Medication compliance and effectiveness Discharge Goals: Return home with outpatient referrals for mental health follow-up including medication management/psychotherapy.     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    Armandina Stammer, NP, pmhnp, fnp-bc. 02/14/2023, 11:41 AM Patient ID: [name], male   DOB: [birth year], 62 y.o.   MRN: [id number] Patient ID: Alvin Saracco, male   DOB: [birth year], 62 y.o.   MRN: [id number] Patient ID: Hester Sunshine, male   DOB: [birth year], 62 y.o.   MRN: [id number] Patient ID: Braxlee Mirchandani, male   DOB: [birth year], 62 y.o.   MRN:  [id number]

## 2023-02-14 NOTE — Progress Notes (Signed)
   02/14/23 2259  Psych Admission Type (Psych Patients Only)  Admission Status Voluntary  Psychosocial Assessment  Patient Complaints Anxiety;Depression  Eye Contact Fair  Facial Expression Sad  Affect Appropriate to circumstance  Speech Logical/coherent  Interaction Assertive  Motor Activity Other (Comment) (WNL)  Appearance/Hygiene Unremarkable  Behavior Characteristics Cooperative;Appropriate to situation  Mood Depressed;Anxious;Pleasant  Thought Process  Coherency WDL  Content WDL  Delusions None reported or observed  Perception Hallucinations  Hallucination Visual  Judgment Impaired  Confusion None  Danger to Self  Current suicidal ideation? Denies  Self-Injurious Behavior No self-injurious ideation or behavior indicators observed or expressed   Agreement Not to Harm Self Yes  Description of Agreement verbal  Danger to Others  Danger to Others None reported or observed

## 2023-02-14 NOTE — Progress Notes (Addendum)
D. Pt presented  less groggy today- reported sleeping well, denied pain, SI/HI and AH, but continues to endorse intermittently "seeing shadows."  A. Labs and vitals monitored. Pt given and educated on medications. Pt supported emotionally and encouraged to express concerns and ask questions.   R. Pt remains safe with 15 minute checks. Will continue POC.    02/14/23 1000  Psych Admission Type (Psych Patients Only)  Admission Status Voluntary  Psychosocial Assessment  Patient Complaints Depression  Eye Contact Fair  Facial Expression Sad  Affect Appropriate to circumstance  Speech Logical/coherent  Interaction Assertive  Motor Activity Other (Comment) (steady gait)  Appearance/Hygiene Unremarkable  Behavior Characteristics Cooperative;Appropriate to situation  Mood Depressed;Pleasant  Thought Process  Coherency WDL  Content WDL  Delusions None reported or observed  Perception Hallucinations  Hallucination Visual  Judgment Impaired  Confusion None  Danger to Self  Current suicidal ideation? Denies  Self-Injurious Behavior No self-injurious ideation or behavior indicators observed or expressed   Agreement Not to Harm Self Yes  Description of Agreement agreed to contact staff before acting on any harmful thoughts  Danger to Others  Danger to Others None reported or observed

## 2023-02-15 ENCOUNTER — Encounter (HOSPITAL_COMMUNITY): Payer: Self-pay

## 2023-02-15 DIAGNOSIS — F1494 Cocaine use, unspecified with cocaine-induced mood disorder: Secondary | ICD-10-CM | POA: Diagnosis not present

## 2023-02-15 LAB — COMPREHENSIVE METABOLIC PANEL
ALT: 49 U/L — ABNORMAL HIGH (ref 0–44)
AST: 42 U/L — ABNORMAL HIGH (ref 15–41)
Albumin: 4 g/dL (ref 3.5–5.0)
Alkaline Phosphatase: 65 U/L (ref 38–126)
Anion gap: 9 (ref 5–15)
BUN: 29 mg/dL — ABNORMAL HIGH (ref 6–20)
CO2: 28 mmol/L (ref 22–32)
Calcium: 9.6 mg/dL (ref 8.9–10.3)
Chloride: 101 mmol/L (ref 98–111)
Creatinine, Ser: 1.34 mg/dL — ABNORMAL HIGH (ref 0.61–1.24)
GFR, Estimated: >60 mL/min (ref >60)
Glucose, Bld: 112 mg/dL — ABNORMAL HIGH (ref 70–99)
Potassium: 4.2 mmol/L (ref 3.5–5.1)
Sodium: 138 mmol/L (ref 135–145)
Total Bilirubin: 0.4 mg/dL (ref 0.3–1.2)
Total Protein: 7.2 g/dL (ref 6.5–8.1)

## 2023-02-15 MED ORDER — TRAZODONE HCL 50 MG PO TABS: 50.0000 mg | ORAL_TABLET | Freq: Every evening | ORAL | 0 refills | Status: DC | PRN

## 2023-02-15 MED ORDER — HYDROXYZINE HCL 25 MG PO TABS
25.0000 mg | ORAL_TABLET | Freq: Three times a day (TID) | ORAL | Status: DC | PRN
  Administered 2023-02-15: 25 mg via ORAL
  Filled 2023-02-15: qty 1
  Filled 2023-02-15: qty 20

## 2023-02-15 MED ORDER — SERTRALINE HCL 50 MG PO TABS: 50.0000 mg | ORAL_TABLET | Freq: Every day | ORAL | 0 refills | Status: DC

## 2023-02-15 MED ORDER — HYDROXYZINE HCL 25 MG PO TABS: 25.0000 mg | ORAL_TABLET | Freq: Three times a day (TID) | ORAL | 0 refills | Status: DC | PRN

## 2023-02-15 NOTE — Progress Notes (Signed)
  [facility] Adult Case Management Discharge Plan :  Will you be returning to the same living situation after discharge:  No. Pt will be In-patient @ [facility] in [facility] At discharge, do you have transportation home?: No. Taxi Provided Do you have the ability to pay for your medications: Yes,  Insured  Release of information consent forms completed and in the chart;  Patient's signature needed at discharge.  Patient to Follow up at:  Follow-up Information     [facility]. Go to.   Specialty: Behavioral Health Why: You may go to this provider for an assessment, to obtain therapy and medication management services. For fastest service, please go on Monday through Friday, arrive by 7:00 am for same day services. Contact information: [address] [place] [address] [phone]        Services, [facility] Follow up.   Why: Referral made Contact information: [address] [place] [address] [phone]                 Next level of care provider has access to [facility] Link:yes  Safety Planning and Suicide Prevention discussed: No. Pt declined consents     Has patient been referred to the Quitline?: Patient refused referral for treatment  Patient has been referred for addiction treatment: Yes, the patient will follow up with an outpatient provider for substance use disorder. Psychiatrist/APP: appointment made and Therapist: appointment made  Ane Payment, LCSW 02/15/2023, 3:11 PM

## 2023-02-15 NOTE — Progress Notes (Signed)
   02/15/23 2043  Psych Admission Type (Psych Patients Only)  Admission Status Voluntary  Psychosocial Assessment  Patient Complaints Anxiety  Eye Contact Fair  Facial Expression Anxious  Affect Appropriate to circumstance  Speech Logical/coherent  Interaction Assertive  Motor Activity Other (Comment) (WNL)  Appearance/Hygiene Unremarkable  Behavior Characteristics Cooperative  Mood Anxious;Pleasant  Thought Process  Coherency WDL  Content WDL  Delusions None reported or observed  Perception WDL  Hallucination None reported or observed  Judgment Impaired  Confusion None  Danger to Self  Current suicidal ideation? Denies  Self-Injurious Behavior No self-injurious ideation or behavior indicators observed or expressed   Agreement Not to Harm Self Yes  Description of Agreement verbal  Danger to Others  Danger to Others None reported or observed

## 2023-02-15 NOTE — [facility] Group Notes (Signed)
[facility] Group Notes:  (Nursing/MHT/Case Management/Adjunct)  Date:  02/15/2023  Time:  11:09 PM  Type of Therapy:  Psychoeducational Skills  Participation Level:  Active  Participation Quality:  Attentive  Affect:  Blunted  Cognitive:  Appropriate  Insight:  Appropriate  Engagement in Group:  Engaged  Modes of Intervention:  Support  Summary of Progress/Problems: The patient attended the evening A.A. group and was appropriate.   Richard Moon 02/15/2023, 11:09 PM

## 2023-02-15 NOTE — Group Note (Signed)
Occupational Therapy Group Note  Group Topic: Sleep Hygiene  Group Date: 02/15/2023 Start Time: 1430 End Time: 1505 Facilitators: Ted Mcalpine, OT   Group Description: Group encouraged increased participation and engagement through topic focused on sleep hygiene. Patients reflected on the quality of sleep they typically receive and identified areas that need improvement. Group was given background information on sleep and sleep hygiene, including common sleep disorders. Group members also received information on how to improve one's sleep and introduced a sleep diary as a tool that can be utilized to track sleep quality over a length of time. Group session ended with patients identifying one or more strategies they could utilize or implement into their sleep routine in order to improve overall sleep quality.        Therapeutic Goal(s):  Identify one or more strategies to improve overall sleep hygiene  Identify one or more areas of sleep that are negatively impacted (sleep too much, too little, etc)     Participation Level: Engaged   Participation Quality: Independent   Behavior: Appropriate   Speech/Thought Process: Relevant   Affect/Mood: Appropriate   Insight: Fair   Judgement: Fair      Modes of Intervention: Education  Patient Response to Interventions:  Attentive   Plan: Continue to engage patient in OT groups 2 - 3x/week.  02/15/2023  Ted Mcalpine, OT   Kerrin Champagne, OT

## 2023-02-15 NOTE — Progress Notes (Signed)
   02/15/23 0900  Psych Admission Type (Psych Patients Only)  Admission Status Voluntary  Psychosocial Assessment  Patient Complaints Anxiety;Depression  Eye Contact Fair  Facial Expression Sad  Affect Appropriate to circumstance  Speech Logical/coherent  Interaction Assertive  Motor Activity Other (Comment) (wnl)  Appearance/Hygiene Unremarkable  Behavior Characteristics Cooperative  Mood Depressed;Anxious;Sad  Thought Process  Coherency WDL  Content WDL  Delusions None reported or observed  Perception Hallucinations  Hallucination Visual  Judgment Impaired  Confusion None  Danger to Self  Current suicidal ideation? Denies  Self-Injurious Behavior No self-injurious ideation or behavior indicators observed or expressed   Agreement Not to Harm Self Yes  Description of Agreement verbal  Danger to Others  Danger to Others None reported or observed

## 2023-02-15 NOTE — Group Note (Signed)
Recreation Therapy Group Note   Group Topic:Stress Management  Group Date: 02/15/2023 Start Time: 0940 End Time: 1000 Facilitators: Avary Eichenberger-McCall, LRT,CTRS Location: [address]   Goal Area(s) Addresses:  Patient will identify positive stress management techniques. Patient will identify benefits of using stress management post d/c.   Group Description: Meditation. LRT played a meditation that focused on not holding yourself back and envision the life you want for your future self. It also talked about letting go of the bad habits/roadblocks that keep you from reaching your full potential and reaching that future vision of yourself.    Affect/Mood: N/A   Participation Level: Did not attend    Clinical Observations/Individualized Feedback:     Plan: Continue to engage patient in RT group sessions 2-3x/week.   Vannie Hochstetler-McCall, LRT,CTRS 02/15/2023 12:11 PM

## 2023-02-15 NOTE — Progress Notes (Cosign Needed Addendum)
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 He is known in this [facility] & the [facility] for complain of worsening symptoms of depression, drug use & or suicidal ideations. Chart review reports indicated that patient has been seen at the other psychiatric hospitals within the surrounding areas with similar complaints. He is admitted to the [facility] this time around with complaint of suicidal ideations with plan to over dose on crack cocaine that did not completely work. His UDS was positive for cocaine. After medical evaluation/stabilization & clearance, he was transferred to the [facility] for further psychiatric evaluation & treatments   Psychiatry team treatment recommendation recommendations:   --Continue Zoloft 50 mg p.o. daily for depression and anxiety -- Continue hydroxyzine 25 mg p.o. 3 times daily as needed for anxiety -- Continue trazodone 50 mg p.o. q. nightly as needed for insomnia  On assessment today, Dhaval reports that his mood is euthymic, improved since admission, and stable. Denies feeling down, depressed, or sad.  Kemauri presents alert, oriented & aware of situation. He is visible on the unit, attending group sessions. Patient was supposed to be discharged to [facility] today 02/15/2023, however, appropriate arrangement did not go through during the weekend.  The LCSW followed through today and secure an acceptance for patient at [facility] for tomorrow, 02/16/23. Vital signs remained within normal limits, and observe patient attending unit group activities and therapeutic milieu.  No changes made to his medication as patient is  stable and ready for discharge tomorrow.       Reports that anxiety symptoms are at manageable level.  Sleep is stable. Appetite is stable.  Concentration is without complaint.  Energy level is adequate. Denies having any suicidal thoughts. Denies having any suicidal intent and plan.  Denies having any HI.  Denies having psychotic symptoms.   Denies having side effects to current psychiatric medications.   Discussed discharge planning: To include continuing his psychotropic medication as ordered at the prevent decompensation of his mental illness.  Education provided on cessation of polysubstance usage as they have adverse effects on overall psychiatric and medical wellbeing.  Further instructed on how to identify the signs of impending crisis, use of internal coping strategies, reaching out to friends and family to help  navigate a crisis, and a list of mental health professionals and agencies to call.  Patient is in agreement with education provided.  Total Time spent with patient:  35 minutes  Past Psychiatric History: Previous Psych Diagnoses: MDD, stimulant use disorder Prior inpatient treatment: Multiple psychiatric inpatient treatment Current/prior outpatient treatment: Denies Prior rehab hx: Yes multiple times Psychotherapy hx: Yes History of suicide: Yes, multiple times.  Recently attempted to overdose on crack cocaine. History of homicide or aggression: Denies Psychiatric medication history: Yes,, trial of medication on Zoloft and hydroxyzine Psychiatric medication compliance history: Noncompliance Neuromodulation history: Denies Current Psychiatrist: Denies Current therapist: Denies  Past Medical History: History reviewed. No pertinent past medical history.  Past Surgical History:  Procedure Laterality Date   HERNIA REPAIR     Family History: History reviewed. No pertinent family history.  Family Psychiatric  History: See H&P  Social History:  Social History  Substance and Sexual Activity  Alcohol Use Yes   Comment: Occasional alcohol use     Social History   Substance and Sexual Activity  Drug Use Yes   Types: Marijuana, Cocaine, Heroin   Comment: daily cocaine use (reported $200-300/ daily); occasional cannabis use    Social History   Socioeconomic History   Marital status: Single    Spouse name: Not on file   Number of children: Not on file   Years of education: Not on file   Highest education level: Not on file  Occupational History   Not on file  Tobacco Use   Smoking status: Some Days    Current packs/day: 0.25    Average packs/day: 0.3 packs/day for 20.0 years (5.0 ttl pk-yrs)    Types: Cigarettes   Smokeless tobacco: Never  Vaping Use   Vaping status: Every Day  Substance and Sexual Activity   Alcohol use: Yes    Comment: Occasional alcohol use   Drug use: Yes    Types: Marijuana, Cocaine, Heroin    Comment: daily cocaine use (reported $200-300/ daily); occasional cannabis use   Sexual activity: Yes    Birth control/protection: Condom  Other Topics Concern   Not on file  Social History Narrative   Not on file   Social Determinants of Health   Financial Resource Strain: Not on file  Food Insecurity: Patient Declined (02/08/2023)   Hunger Vital Sign    Worried About Running Out of Food in the Last Year: Patient declined    Ran Out of Food in the Last Year: Patient declined  Transportation Needs: Unknown (02/08/2023)   PRAPARE - Administrator, Civil Service (Medical): Patient declined    Lack of Transportation (Non-Medical): No  Physical Activity: Not on file  Stress: Not on file  Social Connections: Not on file   Additional Social History:     Sleep: Good  Appetite:  Good  Current Medications: Current Facility-Administered Medications  Medication Dose Route Frequency Provider Last Rate Last Admin   acetaminophen (TYLENOL) tablet 650 mg  650 mg Oral Q6H PRN Massengill, Harrold Donath, MD   650  mg at 02/14/23 1942   alum & mag hydroxide-simeth (MAALOX/MYLANTA) 200-200-20 MG/5ML suspension 30 mL  30 mL Oral Q4H PRN Massengill, Harrold Donath, MD   30 mL at 02/11/23 2122   diphenhydrAMINE (BENADRYL) capsule 50 mg  50 mg Oral TID PRN Phineas Inches, MD   50 mg at 02/12/23 1050   Or   diphenhydrAMINE (BENADRYL) injection 50 mg  50 mg Intramuscular TID PRN Massengill, Harrold Donath, MD       feeding supplement (ENSURE ENLIVE / ENSURE PLUS) liquid 237 mL  237 mL Oral BID BM Massengill, Harrold Donath, MD   237 mL at 02/15/23 1017   haloperidol (HALDOL) tablet 5 mg  5 mg Oral TID PRN Phineas Inches, MD   5 mg at 02/12/23 1050   Or   haloperidol lactate (HALDOL) injection 5 mg  5 mg Intramuscular TID PRN Phineas Inches, MD       hydrOXYzine (ATARAX) tablet 25 mg  25 mg Oral TID Phineas Inches, MD   25 mg at 02/15/23 1025   LORazepam (ATIVAN) tablet 2 mg  2 mg Oral TID PRN Phineas Inches, MD       Or   LORazepam (ATIVAN) injection 2 mg  2 mg Intramuscular TID PRN Massengill, Harrold Donath, MD       magnesium hydroxide (MILK OF MAGNESIA) suspension 30 mL  30 mL Oral  Daily PRN Massengill, Harrold Donath, MD       sertraline (ZOLOFT) tablet 50 mg  50 mg Oral Daily Anden Bartolo C, FNP   50 mg at 02/15/23 1610   traZODone (DESYREL) tablet 50 mg  50 mg Oral QHS PRN Phineas Inches, MD   50 mg at 02/14/23 2059   Lab Results:  No results found for this or any previous visit (from the past 48 hour(s)).  Blood Alcohol level:  Lab Results  Component Value Date   ETH <10 02/06/2023   ETH <10 12/04/2021    Metabolic Disorder Labs: Lab Results  Component Value Date   HGBA1C 6.0 (H) 02/09/2023   MPG 125.5 02/09/2023   MPG 119.76 04/02/2021   No results found for: "PROLACTIN" Lab Results  Component Value Date   CHOL 174 02/09/2023   TRIG 148 02/09/2023   HDL 79 02/09/2023   CHOLHDL 2.2 02/09/2023   VLDL 30 02/09/2023   LDLCALC 65 02/09/2023   LDLCALC 99 04/02/2021   Physical Findings: AIMS: Facial and  Oral Movements Muscles of Facial Expression: None, normal Lips and Perioral Area: None, normal Jaw: None, normal Tongue: None, normal,Extremity Movements Upper (arms, wrists, hands, fingers): None, normal Lower (legs, knees, ankles, toes): None, normal, Trunk Movements Neck, shoulders, hips: None, normal, Overall Severity Severity of abnormal movements (highest score from questions above): None, normal Incapacitation due to abnormal movements: None, normal Patient's awareness of abnormal movements (rate only patient's report): No Awareness, Dental Status Current problems with teeth and/or dentures?: No Does patient usually wear dentures?: No  CIWA:    COWS:     Musculoskeletal: Strength & Muscle Tone: within normal limits Gait & Station: normal Patient leans: N/A  Psychiatric Specialty Exam:  Presentation  General Appearance:  Casual; Fairly Groomed; Appropriate for Environment  Eye Contact: Good  Speech: Clear and Coherent; Normal Rate  Speech Volume: Normal  Handedness: Right  Mood and Affect  Mood: Anxious  Affect: Congruent  Thought Process  Thought Processes: Coherent; Goal Directed  Descriptions of Associations:Intact  Orientation:Full (Time, Place and Person)  Thought Content:Logical  History of Schizophrenia/Schizoaffective disorder:No  Duration of Psychotic Symptoms:No data recorded Hallucinations:Hallucinations: None Description of Visual Hallucinations: -- (n/a)   Ideas of Reference:None  Suicidal Thoughts:Suicidal Thoughts: No SI Active Intent and/or Plan: -- (n/a)  Homicidal Thoughts:No data recorded  Sensorium  Memory: Immediate Good; Recent Good  Judgment: Fair  Insight: Fair  Art therapist  Concentration: Good  Attention Span: Good  Recall: Fair  Fund of Knowledge: Fair  Language: Good  Psychomotor Activity  Psychomotor Activity: Psychomotor Activity: Normal  Assets  Assets: Communication  Skills; Desire for Improvement; Physical Health; Resilience  Sleep  Sleep: Sleep: Good Number of Hours of Sleep: 8.25  Physical Exam: Physical Exam Vitals and nursing note reviewed.  HENT:     Head: Normocephalic.     Nose: Nose normal.     Mouth/Throat:     Mouth: Mucous membranes are moist.  Eyes:     Extraocular Movements: Extraocular movements intact.  Cardiovascular:     Rate and Rhythm: Normal rate.     Pulses: Normal pulses.  Pulmonary:     Effort: Pulmonary effort is normal.  Abdominal:     Comments: Deferred   Genitourinary:    Comments: Deferred Musculoskeletal:        General: Normal range of motion.     Cervical back: Normal range of motion.  Skin:    General: Skin is warm.  Neurological:  General: No focal deficit present.     Mental Status: He is alert and oriented to person, place, and time.  Psychiatric:        Mood and Affect: Mood normal.        Behavior: Behavior normal.        Thought Content: Thought content normal.    Review of Systems  Constitutional:  Negative for chills and fever.  HENT:  Negative for sore throat.   Eyes:  Negative for blurred vision.  Respiratory:  Negative for cough, shortness of breath and wheezing.   Cardiovascular:  Negative for chest pain and palpitations.  Gastrointestinal:  Negative for abdominal pain, heartburn, nausea and vomiting.  Genitourinary: Negative.   Musculoskeletal: Negative.   Skin:  Negative for itching and rash.  Neurological:  Positive for headaches. Negative for dizziness and tingling.  Endo/Heme/Allergies:        See allergy listing  Psychiatric/Behavioral:  Positive for depression, hallucinations and suicidal ideas. The patient is nervous/anxious.    Blood pressure 103/76, pulse 92, temperature 98.1 F (36.7 C), temperature source Oral, resp. rate 14, height 5\' 10"  (1.778 m), weight 62.1 kg, SpO2 98%. Body mass index is 19.66 kg/m.  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 daily for depression/anxiety -- Continue hydroxyzine 25 mg po tid prn for anxiety -- Continue trazodone 50 mg po q hs for insomnia   Agitation protocol: Benadryl capsule 50 mg p.o. or IM 3 times daily as needed agitation   Haldol tablets 5 mg po IM 3 times daily as needed agitation   Lorazepam tablet 2 mg p.o. or IM 3 times daily as needed agitation     Other PRN Medications -Acetaminophen 650 mg every 6 as needed/mild pain -Maalox 30 mL oral every 4 as needed/digestion -Magnesium hydroxide 30 mL daily as needed/mild constipation   -- The risks/benefits/side-effects/alternatives to this medication were discussed in detail with the patient and time was given for questions. The patient consents to medication trial.              -- Encouraged patient to participate  in unit milieu and in scheduled group therapies     Safety and Monitoring: Voluntary admission to inpatient psychiatric unit for safety, stabilization and treatment Daily contact with patient to assess and evaluate symptoms and progress in treatment Patient's case to be discussed in multi-disciplinary team meeting Observation Level : q15 minute checks Vital signs: q12 hours Precautions: suicide, but pt currently verbally contracts for safety on unit    Discharge Planning: Social work and case management to assist with discharge planning and identification of hospital follow-up needs prior to discharge Estimated LOS: 5-7 days Discharge Concerns: Need to establish a safety plan; Medication compliance and effectiveness Discharge Goals: Return home with outpatient referrals for mental health follow-up including medication management/psychotherapy.      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    Cecilie Lowers, FNP. 02/15/2023, 2:32 PM Patient ID: [name], male   DOB: May 04, 1961, 62 y.o.   MRN: [id number] Patient ID: Godson Waites, male   DOB: [birth year], 62 y.o.   MRN: [id number] Patient ID: [name], male  DOB: 08-18-1960, 62 y.o.   MRN: [id number] Patient ID: Rolin Kochanowski, male   DOB: September 25, 1960, 62 y.o.   MRN: [id number] Patient ID: Kenan Lu, male   DOB: 12/21/1960, 62 y.o.   MRN: [id number]

## 2023-02-15 NOTE — [facility] Suicide Risk Assessment (Signed)
[facility] INPATIENT:  Family/Significant Other Suicide Prevention Education  Suicide Prevention Education:  Patient Refusal for Family/Significant Other Suicide Prevention Education: The patient Richard Moon has refused to provide written consent for family/significant other to be provided Family/Significant Other Suicide Prevention Education during admission and/or prior to discharge.  Physician notified.  Rhian Asebedo S [name] 02/15/2023, 3:15 PM

## 2023-02-15 NOTE — BH IP Treatment Plan (Signed)
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)   Current Medications:  Current Facility-Administered Medications  Medication Dose Route Frequency Provider Last Rate Last Admin   acetaminophen (TYLENOL) tablet 650 mg  650 mg Oral Q6H PRN Phineas Inches, MD   650 mg at 02/14/23 1942   alum & mag hydroxide-simeth (MAALOX/MYLANTA) 200-200-20 MG/5ML suspension 30 mL  30 mL Oral Q4H PRN Phineas Inches, MD   30 mL at 02/11/23 2122   diphenhydrAMINE (BENADRYL) capsule 50 mg  50 mg Oral TID PRN Phineas Inches, MD   50 mg at 02/12/23 1050   Or   diphenhydrAMINE (BENADRYL) injection 50 mg  50 mg Intramuscular TID PRN Massengill, Harrold Donath, MD       feeding supplement (ENSURE ENLIVE / ENSURE PLUS) liquid 237 mL  237 mL Oral BID BM Massengill, Harrold Donath, MD   237 mL at 02/15/23 1017   haloperidol (HALDOL) tablet 5 mg  5 mg Oral TID PRN Phineas Inches, MD   5 mg at 02/12/23 1050   Or   haloperidol lactate (HALDOL) injection 5 mg  5 mg Intramuscular TID PRN Phineas Inches, MD       hydrOXYzine (ATARAX) tablet 25 mg  25 mg Oral TID Phineas Inches, MD   25 mg at 02/15/23 1025   LORazepam (ATIVAN) tablet 2 mg  2 mg Oral TID PRN Phineas Inches, MD       Or   LORazepam (ATIVAN) injection 2 mg  2 mg Intramuscular TID PRN Massengill, Harrold Donath, MD       magnesium hydroxide (MILK OF MAGNESIA) suspension 30 mL  30 mL Oral Daily PRN Massengill, Nathan, MD       sertraline (ZOLOFT) tablet 50 mg  50 mg Oral Daily Ntuen, Tina C, FNP   50 mg at 02/15/23 7564   traZODone (DESYREL) tablet 50 mg  50 mg Oral QHS PRN Phineas Inches, MD   50 mg at 02/14/23 2059   PTA Medications: No medications prior to admission.    Patient  Stressors: Neurosurgeon issue   Substance abuse    Patient Strengths: Ability for Contractor for treatment/growth   Treatment Modalities: Medication Management, Group therapy, Case management,  1 to 1 session with clinician, Psychoeducation, Recreational therapy.   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) Long Term Goal(s): Improvement in symptoms so as ready for discharge   Short Term Goals: Ability to identify changes in lifestyle to reduce recurrence of condition will improve Ability to verbalize feelings will improve Ability to disclose and discuss suicidal ideas Ability to demonstrate self-control will improve Ability to identify and develop effective coping behaviors will improve Ability to maintain clinical measurements within normal limits will improve Compliance with prescribed medications will improve Ability to identify triggers associated with substance abuse/mental health issues will improve  Medication Management: Evaluate patient's response, side effects, and tolerance of medication regimen.  Therapeutic Interventions: 1 to 1 sessions, Unit Group sessions and Medication administration.  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)  Long Term Goal(s): Improvement in symptoms so as ready for discharge   Short Term Goals: Ability to identify changes in lifestyle to reduce  recurrence of condition will improve Ability to verbalize feelings will improve Ability to disclose and discuss suicidal ideas Ability to demonstrate self-control will improve Ability to identify and develop effective coping behaviors will improve Ability to maintain clinical measurements within normal limits will improve Compliance with prescribed medications  will improve Ability to identify triggers associated with substance abuse/mental health issues will improve     Medication Management: Evaluate patient's response, side effects, and tolerance of medication regimen.  Therapeutic Interventions: 1 to 1 sessions, Unit Group sessions and Medication administration.  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) Long Term Goal(s): Knowledge of disease and therapeutic regimen to maintain health will improve  Short Term Goals: Ability to remain free from injury will improve, Ability to verbalize frustration and anger appropriately will improve, Ability to participate in decision making will improve, Ability to verbalize feelings will improve, Ability to identify and develop effective coping behaviors will improve, and Compliance with prescribed medications will improve  Medication Management: RN will administer medications as ordered by provider, will assess and evaluate patient's response and provide education to patient for prescribed medication. RN will report any adverse and/or side effects to prescribing provider.  Therapeutic Interventions: 1 on 1 counseling sessions, Psychoeducation, Medication administration, Evaluate responses to treatment, Monitor vital signs and CBGs as ordered, Perform/monitor CIWA, COWS, AIMS and Fall Risk screenings as ordered, Perform wound care treatments as ordered.  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  Short Term Goals: Engage patient in aftercare planning with referrals and resources, Increase social support, Increase emotional regulation, Facilitate acceptance of  mental health diagnosis and concerns, Identify triggers associated with mental health/substance abuse issues, and Increase skills for wellness and recovery  Therapeutic Interventions: Assess for all discharge needs, 1 to 1 time with Social worker, Explore available resources and support systems, Assess for adequacy in community support network, Educate family and significant other(s) on suicide prevention, Complete Psychosocial Assessment, Interpersonal group therapy.  Evaluation of Outcomes: Progressing   Progress in Treatment: Attending groups: Yes. Participating in groups: Yes. Taking medication as prescribed: Yes. Toleration medication: Yes. Family/Significant other contact made: No, will contact:  Pt declined consent Patient understands diagnosis: Yes. Discussing patient identified problems/goals with staff: Yes. Medical problems stabilized or resolved: No. Denies suicidal/homicidal ideation: Yes. Issues/concerns per patient self-inventory: No.   New problem(s) identified: No, Describe:  none reported   New Short Term/Long Term Goal(s):detox, medication management for mood stabilization; elimination of SI thoughts; development of comprehensive mental wellness/sobriety plan   Patient Goals:  "find out what's going on with me and go to rehab."   Discharge Plan or Barriers: Patient recently admitted. CSW will continue to follow and assess for appropriate referrals and possible discharge planning.      Reason for Continuation of Hospitalization: Depression Medication stabilization Suicidal ideation   Estimated Length of Stay: 1-3 days   Last 3 [place] Suicide Severity Risk Score: Flowsheet Row [facility] (Current[facility] from [facility]/06/2023 in [facility] ED to Hosp-Admission (Discharged) from 05/26/2022 in [facility]  C-SSRS RISK CATEGORY Low  Risk High Risk No Risk  Last PHQ 2/9 Scores:    03/31/2021    4:41 PM 05/25/2020    2:08 PM  Depression screen PHQ 2/9  Decreased Interest 2 3  Down, Depressed, Hopeless 2 3  PHQ - 2 Score 4 6  Altered sleeping 2 3  Tired, decreased energy 1 3  Change in appetite 1 3  Feeling bad or failure about yourself  2 3  Trouble concentrating 1 1  Moving slowly or fidgety/restless 1 3  Suicidal thoughts 2 3  PHQ-9 Score 14 25  Difficult doing work/chores Somewhat difficult Very difficult    Scribe for Treatment Team: Izell [place], [place] 02/15/2023 2:11 PM

## 2023-02-15 NOTE — [facility] Group Notes (Signed)
Spiritual care group on grief and loss facilitated by Chaplain Dyanne Carrel, [facility]  Group Goal: Support / Education around grief and loss  Members engage in facilitated group support and psycho-social education.  Group Description:  Following introductions and group rules, group members engaged in facilitated group dialogue and support around topic of loss, with particular support around experiences of loss in their lives. Group Identified types of loss (relationships / self / things) and identified patterns, circumstances, and changes that precipitate losses. Reflected on thoughts / feelings around loss, normalized grief responses, and recognized variety in grief experience. Group encouraged individual reflection on safe space and on the coping skills that they are already utilizing.  Group drew on Adlerian / Rogerian and narrative framework  Patient Progress: Richard Moon attended the first part of group, but left shortly after group began.

## 2023-02-16 DIAGNOSIS — F1494 Cocaine use, unspecified with cocaine-induced mood disorder: Secondary | ICD-10-CM | POA: Diagnosis not present

## 2023-02-16 NOTE — Plan of Care (Signed)
Care plan complete

## 2023-02-16 NOTE — Progress Notes (Signed)
Patient is discharging at this time. Patient is A&Ox4. Stable. Patient denies SI,HI, and A/V/H with no plan/intent. Printed AVS reviewed with and given to patient along with medications and follow up appointments. Suicide safety plan complete with copy provided to patient. Original form in chart. Patient verbalized all understanding. All valuables/belongings returned to patient. Patient is being transported by taxi to [facility]. Patient denies any pain/discomfort. No s/s of current distress.

## 2023-02-16 NOTE — BHH Suicide Risk Assessment (Signed)
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 He is known in this [facility] & the [facility] for complain of worsening symptoms of depression, drug use & or suicidal ideations. Chart review reports indicated that patient has been seen at the other psychiatric hospitals within the surrounding areas with similar complaints. He is admitted to the [facility] this time around with complaint of suicidal ideations with plan to over dose on crack cocaine that did not completely work. His UDS was positive for cocaine. After medical evaluation/stabilization & clearance, he was transferred to the [facility] for further psychiatric evaluation & treatments.     During the patient's hospitalization, patient had extensive initial psychiatric evaluation, and follow-up psychiatric evaluations every day.   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 daily for depression and anxiety    During the hospitalization, other adjustments were made to the patient's psychiatric medication regimen:  -Zoloft was increased to 50 mg every day    Patient's care was discussed during the interdisciplinary team meeting every day during the hospitalization.   The patient denied having side effects to prescribed psychiatric medication.   Gradually, patient started adjusting to milieu. The patient was evaluated each day by a clinical provider to ascertain response to treatment. Improvement was noted by the patient's report of  decreasing symptoms, improved sleep and appetite, affect, medication tolerance, behavior, and participation in unit programming.  Patient was asked each day to complete a self inventory noting mood, mental status, pain, new symptoms, anxiety and concerns.     Symptoms were reported as significantly decreased or resolved completely by discharge.    On day of discharge, the patient reports that their mood is stable. The patient denied having suicidal thoughts for more than 48 hours prior to discharge.  Patient denies having homicidal thoughts.  Patient denies having auditory hallucinations.  Patient denies any visual hallucinations or other symptoms of psychosis. The patient was motivated to continue taking medication with a goal of continued improvement in mental health.    The patient reports their target psychiatric symptoms of depression and suicidal thoughts, all responded well to the psychiatric medications, and the patient reports overall benefit other psychiatric hospitalization. Supportive psychotherapy was provided to the patient. The patient also participated in regular group therapy while hospitalized. Coping skills, problem solving as well as relaxation therapies were also part of the unit programming.   Labs were reviewed with the patient, and abnormal results were discussed with the patient.   The patient is able to verbalize their individual safety plan to this provider.   # It is recommended to the patient to continue psychiatric medications as prescribed, after discharge from the hospital.     # It is recommended to the patient to follow up with your outpatient psychiatric provider and PCP.   # It was discussed with the patient, the impact of alcohol, drugs, tobacco have been there overall psychiatric and medical wellbeing, and total abstinence from substance use was recommended the patient.ed.   # Prescriptions provided or sent directly to preferred pharmacy at discharge.  Patient  agreeable to plan. Given opportunity to ask questions. Appears to feel comfortable with discharge.    # In the event of worsening symptoms, the patient is instructed to call the crisis hotline, 911 and or go to the nearest ED for appropriate evaluation and treatment of symptoms. To follow-up with primary care provider for other medical issues, concerns and or health care needs   # [facility], with a plan to follow up as noted below.    Psychiatric Specialty Exam  Presentation  General Appearance:  Appropriate for Environment; Casual; Fairly Groomed  Eye Contact: Good  Speech: Normal Rate; Clear and Coherent  Speech Volume: Normal  Handedness: Right   Mood and Affect  Mood: Euthymic  Duration of Depression Symptoms: Greater than two weeks  Affect: Appropriate; Congruent; Full Range   Thought Process  Thought Processes: Linear  Descriptions of Associations:Intact  Orientation:Full (Time, Place and Person)  Thought Content:Logical  History of Schizophrenia/Schizoaffective disorder:No  Duration of Psychotic Symptoms:No data recorded Hallucinations:Hallucinations: None Description of Visual Hallucinations: -- (n/a)  Ideas of Reference:None  Suicidal Thoughts:Suicidal Thoughts: No SI Active Intent and/or Plan: -- (n/a)  Homicidal Thoughts:Homicidal Thoughts: No   Sensorium  Memory: Immediate Good; Recent Good; Remote Good  Judgment: Fair  Insight: Fair   Art therapist  Concentration: Fair  Attention Span: Fair  Recall: Good  Fund of Knowledge: Good  Language: Good   Psychomotor Activity  Psychomotor Activity: Psychomotor Activity: Normal   Assets  Assets: Communication Skills; Desire for Improvement; Physical Health; Resilience   Sleep  Sleep: Sleep: Fair Number of Hours of Sleep: 8.25   Physical Exam: Physical Exam See discharge summary  ROS See discharge summary  Blood  pressure 94/73, pulse 79, temperature 98 F (36.7 C), temperature source Oral, resp. rate 16, height 5\' 10"  (1.778 m), weight 62.1 kg, SpO2 98%. Body mass index is 19.66 kg/m.  Mental Status Per Nursing Assessment::   On Admission:  Self-harm thoughts  Demographic factors:  Male, Low socioeconomic status, Unemployed Loss Factors:  Financial problems / change in socioeconomic status Historical Factors:  NA Risk Reduction Factors:  NA  Continued Clinical Symptoms:  Mood is stable. Denying SI. Denying drug w/d or cravings.   Cognitive Features That Contribute To Risk:  None    Suicide Risk:  Mild:  There are no identifiable suicide plans, no associated intent, mild dysphoria and related symptoms, good self-control (both objective and subjective assessment), few other risk factors, and identifiable protective factors, including available and accessible social support.    Follow-up Information     [facility]. Go to.   Specialty: Behavioral Health Why: You may go to this provider for an assessment, to obtain therapy and medication management services. For fastest service, please go on Monday through Friday, arrive by 7:00 am for same day services. Contact information: [address] [place] [address] [phone]        [facility] Follow up.   Why: Referral made Contact information: [address] [place] [address] [phone]                 Plan Of Care/Follow-up recommendations:   -Follow-up with your outpatient psychiatric provider -instructions on appointment date, time, and address (location) are provided to you in discharge paperwork.   -Take your psychiatric medications as prescribed at discharge - instructions are provided to you in the discharge paperwork   -Follow-up with outpatient primary care  doctor and other specialists -for management of preventative medicine and chronic medical  disease   -Testing: Follow-up with outpatient provider for abnormal lab results:  02-16-2023: BUN 29; Cr 1.34; AST 42; ALT 49    -If you are prescribed an atypical antipsychotic medication, we recommend that your outpatient psychiatrist follow routine screening for side effects within 3 months of discharge, including monitoring: AIMS scale, height, weight, blood pressure, fasting lipid panel, HbA1c, and fasting blood sugar.    -Recommend total abstinence from alcohol, tobacco, and other illicit drug use at discharge.    -If your psychiatric symptoms recur, worsen, or if you have side effects to your psychiatric medications, call your outpatient psychiatric provider, 911, 988 or go to the nearest emergency department.   -If suicidal thoughts occur, immediately call your outpatient psychiatric provider, 911, 988 or go to the nearest emergency department.    Cristy Hilts, MD 02/16/2023, 7:23 AM

## 2023-02-16 NOTE — Discharge Summary (Signed)
Physician Discharge Summary Note  Patient:  Richard Moon is an 62 y.o., male MRN:  [id number] DOB:  06/15/1961 Patient phone:  [phone] (home)  Patient address:   [address] [place] [address],  Total Time spent with patient: 20 minutes  Date of Admission:  02/08/2023 Date of Discharge: 02-16-2023  Reason for Admission:   Richard Moon is a 62 year old AA male with prior psychiatric hx significant for major depressive disorder & stimulant (cocaine) use disorder. He is known in this [facility] & the [facility] for complain of worsening symptoms of depression, drug use & or suicidal ideations. Chart review reports indicated that patient has been seen at the other psychiatric hospitals within the surrounding areas with similar complaints. He is admitted to the [facility] this time around with complaint of suicidal ideations with plan to over dose on crack cocaine that did not completely work. His UDS was positive for cocaine. After medical evaluation/stabilization & clearance, he was transferred [facility] the [facility] for further psychiatric evaluation & treatments.      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  Recently attempted to overdose on crack cocaine. History of homicide or aggression: Denies Psychiatric medication history: Yes,, trial of medication on Zoloft and hydroxyzine Psychiatric medication compliance history: Noncompliance Neuromodulation history: Denies Current Psychiatrist: Denies Current therapist: Denies    Past Medical History: History reviewed. No  pertinent past medical history.  Past Surgical History:  Procedure Laterality Date   HERNIA REPAIR     Family History: History reviewed. No pertinent family history. Family Psychiatric  History: See H&P  Social History:  Social History   Substance and Sexual Activity  Alcohol Use Yes   Comment: Occasional alcohol use     Social History   Substance and Sexual Activity  Drug Use Yes   Types: Marijuana, Cocaine, Heroin   Comment: daily cocaine use (reported $200-300/ daily); occasional cannabis use    Social History   Socioeconomic History   Marital status: Single    Spouse name: Not on file   Number of children: Not on file   Years of education: Not on file   Highest education level: Not on file  Occupational History   Not on file  Tobacco Use   Smoking status: Some Days    Current packs/day: 0.25    Average packs/day: 0.3 packs/day for 20.0 years (5.0 ttl pk-yrs)    Types: Cigarettes   Smokeless tobacco: Never  Vaping Use   Vaping status: Every Day  Substance and Sexual Activity   Alcohol use: Yes    Comment: Occasional alcohol use   Drug use: Yes    Types: Marijuana, Cocaine, Heroin    Comment: daily cocaine use (reported $200-300/ daily); occasional cannabis use   Sexual activity: Yes    Birth control/protection: Condom  Other Topics Concern   Not on file  Social History Narrative   Not on file   Social Determinants of Health   Financial Resource Strain: Not on file  Food Insecurity: Patient Declined (02/08/2023)   Hunger Vital Sign    Worried About Running Out of Food in the Last Year: Patient declined    Barista  in the Last Year: Patient declined  Transportation Needs: Unknown (02/08/2023)   PRAPARE - Administrator, Civil Service (Medical): Patient declined    Lack of Transportation (Non-Medical): No  Physical Activity: Not on file  Stress: Not on file  Social Connections: Not on file    Hospital Course:   During the patient's  hospitalization, patient had extensive initial psychiatric evaluation, and follow-up psychiatric evaluations every day.  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 daily for depression and anxiety   During the hospitalization, other adjustments were made to the patient's psychiatric medication regimen:  -Zoloft was increased to 50 mg every day   Patient's care was discussed during the interdisciplinary team meeting every day during the hospitalization.  The patient denied having side effects to prescribed psychiatric medication.  Gradually, patient started adjusting to milieu. The patient was evaluated each day by a clinical provider to ascertain response to treatment. Improvement was noted by the patient's report of decreasing symptoms, improved sleep and appetite, affect, medication tolerance, behavior, and participation in unit programming.  Patient was asked each day to complete a self inventory noting mood, mental status, pain, new symptoms, anxiety and concerns.    Symptoms were reported as significantly decreased or resolved completely by discharge.   On day of discharge, the patient reports that their mood is stable. The patient denied having suicidal thoughts for more than 48 hours prior to discharge.  Patient denies having homicidal thoughts.  Patient denies having auditory hallucinations.  Patient denies any visual hallucinations or other symptoms of psychosis. The patient was motivated to continue taking medication with a goal of continued improvement in mental health.   The patient reports their target psychiatric symptoms of depression and suicidal thoughts, all responded well to the psychiatric medications, and the patient reports overall benefit other psychiatric hospitalization. Supportive psychotherapy was  provided to the patient. The patient also participated in regular group therapy while hospitalized. Coping skills, problem solving as well as relaxation therapies were also part of the unit programming.  Labs were reviewed with the patient, and abnormal results were discussed with the patient.  The patient is able to verbalize their individual safety plan to this provider.  # It is recommended to the patient to continue psychiatric medications as prescribed, after discharge from the hospital.    # It is recommended to the patient to follow up with your outpatient psychiatric provider and PCP.  # It was discussed with the patient, the impact of alcohol, drugs, tobacco have been there overall psychiatric and medical wellbeing, and total abstinence from substance use was recommended the patient.ed.  # Prescriptions provided or sent directly to preferred pharmacy at discharge. Patient agreeable to plan. Given opportunity to ask questions. Appears to feel comfortable with discharge.    # In the event of worsening symptoms, the patient is instructed to call the crisis hotline, 911 and or go to the nearest ED for appropriate evaluation and treatment of symptoms. To follow-up with primary care provider for other medical issues, concerns and or health care needs  # [facility], with a plan to follow up as noted below.   Physical Findings: AIMS: Facial and Oral Movements Muscles of Facial Expression: None, normal Lips and Perioral Area: None, normal Jaw: None, normal Tongue: None, normal,Extremity Movements Upper (arms, wrists, hands, fingers): None,  normal Lower (legs, knees, ankles, toes): None, normal, Trunk Movements Neck, shoulders, hips: None, normal, Overall Severity Severity of abnormal movements (highest score from questions above): None, normal Incapacitation due to abnormal movements: None, normal Patient's awareness of abnormal movements (rate only  patient's report): No Awareness, Dental Status Current problems with teeth and/or dentures?: No Does patient usually wear dentures?: No  CIWA:    COWS:     Musculoskeletal: Strength & Muscle Tone: within normal limits Gait & Station: normal Patient leans: N/A   Psychiatric Specialty Exam:  Presentation  General Appearance:  Appropriate for Environment; Casual; Fairly Groomed  Eye Contact: Good  Speech: Normal Rate; Clear and Coherent  Speech Volume: Normal  Handedness: Right   Mood and Affect  Mood: Euthymic  Affect: Appropriate; Congruent; Full Range   Thought Process  Thought Processes: Linear  Descriptions of Associations:Intact  Orientation:Full (Time, Place and Person)  Thought Content:Logical  History of Schizophrenia/Schizoaffective disorder:No  Duration of Psychotic Symptoms:No data recorded Hallucinations:Hallucinations: None Description of Visual Hallucinations: -- (n/a)  Ideas of Reference:None  Suicidal Thoughts:Suicidal Thoughts: No SI Active Intent and/or Plan: -- (n/a)  Homicidal Thoughts:Homicidal Thoughts: No   Sensorium  Memory: Immediate Good; Recent Good; Remote Good  Judgment: Fair  Insight: Fair   Art therapist  Concentration: Fair  Attention Span: Fair  Recall: Good  Fund of Knowledge: Good  Language: Good   Psychomotor Activity  Psychomotor Activity: Psychomotor Activity: Normal   Assets  Assets: Communication Skills; Desire for Improvement; Physical Health; Resilience   Sleep  Sleep: Sleep: Fair Number of Hours of Sleep: 8.25    Physical Exam: Physical Exam Vitals reviewed.  Constitutional:      General: He is not in acute distress.    Appearance: He is normal weight. He is not toxic-appearing.  Pulmonary:     Effort: Pulmonary effort is normal. No respiratory distress.  Neurological:     Mental Status: He is alert.     Motor: No weakness.     Gait: Gait normal.   Psychiatric:        Mood and Affect: Mood normal.        Behavior: Behavior normal.        Thought Content: Thought content normal.        Judgment: Judgment normal.    Review of Systems  Constitutional:  Negative for chills and fever.  Cardiovascular:  Negative for chest pain and palpitations.  Neurological:  Negative for dizziness, tingling, tremors and headaches.  Psychiatric/Behavioral:  Negative for depression, hallucinations, memory loss, substance abuse and suicidal ideas. The patient is not nervous/anxious and does not have insomnia.   All other systems reviewed and are negative.  Blood pressure 94/73, pulse 79, temperature 98 F (36.7 C), temperature source Oral, resp. rate 16, height 5\' 10"  (1.778 m), weight 62.1 kg, SpO2 98%. Body mass index is 19.66 kg/m.   Social History   Tobacco Use  Smoking Status Some Days   Current packs/day: 0.25   Average packs/day: 0.3 packs/day for 20.0 years (5.0 ttl pk-yrs)   Types: Cigarettes  Smokeless Tobacco Never   Tobacco Cessation:  Prescription not provided because: pt specifically declined treatment/NRT   Blood Alcohol level:  Lab Results  Component Value Date   ETH <10 02/06/2023   ETH <10 12/04/2021    Metabolic Disorder Labs:  Lab Results  Component Value Date   HGBA1C 6.0 (H) 02/09/2023   MPG 125.5 02/09/2023   MPG 119.76 04/02/2021   No results found  for: "PROLACTIN" Lab Results  Component Value Date   CHOL 174 02/09/2023   TRIG 148 02/09/2023   HDL 79 02/09/2023   CHOLHDL 2.2 02/09/2023   VLDL 30 02/09/2023   LDLCALC 65 02/09/2023   LDLCALC 99 04/02/2021    See Psychiatric Specialty Exam and Suicide Risk Assessment completed by Attending Physician prior to discharge.  Discharge destination:  [name]  Is patient on multiple antipsychotic therapies at discharge:  No   Has Patient had three or more failed trials of antipsychotic monotherapy by history:  No  Recommended Plan for Multiple  Antipsychotic Therapies: NA  Discharge Instructions     Diet - low sodium heart healthy   Complete by: As directed    Increase activity slowly   Complete by: As directed       Allergies as of 02/16/2023   No Known Allergies      Medication List     TAKE these medications      Indication  hydrOXYzine 25 MG tablet Commonly known as: ATARAX Take 1 tablet (25 mg total) by mouth 3 (three) times daily as needed for anxiety.  Indication: Feeling Anxious   sertraline 50 MG tablet Commonly known as: ZOLOFT Take 1 tablet (50 mg total) by mouth daily.  Indication: Major Depressive Disorder   traZODone 50 MG tablet Commonly known as: DESYREL Take 1 tablet (50 mg total) by mouth at bedtime as needed for sleep.  Indication: Trouble Sleeping        Follow-up Information     [facility]. Go to.   Specialty: Behavioral Health Why: You may go to this provider for an assessment, to obtain therapy and medication management services. For fastest service, please go on Monday through Friday, arrive by 7:00 am for same day services. Contact information: [address] [place] [address] [phone]        [facility] Follow up.   Why: Referral made Contact information: [address] [place] [address] [phone]                 Follow-up recommendations:    Activity: as tolerated  Diet: heart healthy  Other: -Follow-up with your outpatient psychiatric provider -instructions on appointment date, time, and address (location) are provided to you in discharge paperwork.  -Take your psychiatric medications as prescribed at discharge - instructions are provided to you in the discharge paperwork  -Follow-up with outpatient primary care doctor and other specialists -for management of preventative medicine and chronic medical disease  -Testing: Follow-up with outpatient provider for abnormal lab results:   02-16-2023: BUN 29; Cr 1.34; AST 42; ALT 49   -If you are prescribed an atypical antipsychotic medication, we recommend that your outpatient psychiatrist follow routine screening for side effects within 3 months of discharge, including monitoring: AIMS scale, height, weight, blood pressure, fasting lipid panel, HbA1c, and fasting blood sugar.   -Recommend total abstinence from alcohol, tobacco, and other illicit drug use at discharge.   -If your psychiatric symptoms recur, worsen, or if you have side effects to your psychiatric medications, call your outpatient psychiatric provider, 911, 988 or go to the nearest emergency department.  -If suicidal thoughts occur, immediately call your outpatient psychiatric provider, 911, 988 or go to the nearest emergency department.   Signed: Cristy Hilts, MD 02/16/2023, 7:20 AM  Total Time Spent in Direct Patient Care:  I personally spent 35 minutes on the unit in direct patient care. The  direct patient care time included face-to-face time with the patient, reviewing the patient's chart, communicating with other professionals, and coordinating care. Greater than 50% of this time was spent in counseling or coordinating care with the patient regarding goals of hospitalization, psycho-education, and discharge planning needs.   Phineas Inches, MD Psychiatrist

## 2023-06-03 ENCOUNTER — Other Ambulatory Visit: Payer: Self-pay

## 2023-06-03 ENCOUNTER — Emergency Department (HOSPITAL_COMMUNITY)
Admission: EM | Admit: 2023-06-03 | Discharge: 2023-06-04 | Disposition: A | Discharge status: Other Acute Inpatient Hospital | Payer: MEDICAID | Attending: Emergency Medicine | Admitting: Emergency Medicine

## 2023-06-03 DIAGNOSIS — X58XXXA Exposure to other specified factors, initial encounter: Secondary | ICD-10-CM | POA: Diagnosis not present

## 2023-06-03 DIAGNOSIS — S81802A Unspecified open wound, left lower leg, initial encounter: Secondary | ICD-10-CM | POA: Insufficient documentation

## 2023-06-03 DIAGNOSIS — R45851 Suicidal ideations: Secondary | ICD-10-CM | POA: Diagnosis not present

## 2023-06-03 DIAGNOSIS — F17210 Nicotine dependence, cigarettes, uncomplicated: Secondary | ICD-10-CM | POA: Diagnosis not present

## 2023-06-03 DIAGNOSIS — F1414 Cocaine abuse with cocaine-induced mood disorder: Secondary | ICD-10-CM | POA: Diagnosis present

## 2023-06-03 NOTE — ED Triage Notes (Signed)
Patient requesting wound check at his old left posterior lower leg scar . No redness/no drainage.

## 2023-06-04 ENCOUNTER — Emergency Department (HOSPITAL_COMMUNITY)
Admission: EM | Admit: 2023-06-04 | Discharge: 2023-06-04 | Disposition: A | Discharge status: Other Acute Inpatient Hospital | Payer: MEDICAID | Source: Home / Self Care | Attending: Emergency Medicine | Admitting: Emergency Medicine

## 2023-06-04 ENCOUNTER — Other Ambulatory Visit: Payer: Self-pay

## 2023-06-04 ENCOUNTER — Other Ambulatory Visit (HOSPITAL_COMMUNITY)
Admission: EM | Admit: 2023-06-04 | Discharge: 2023-06-08 | Disposition: A | Discharge status: Home / Self Care | Payer: MEDICAID | Attending: Addiction Medicine | Admitting: Addiction Medicine

## 2023-06-04 DIAGNOSIS — Z79899 Other long term (current) drug therapy: Secondary | ICD-10-CM | POA: Insufficient documentation

## 2023-06-04 DIAGNOSIS — F1994 Other psychoactive substance use, unspecified with psychoactive substance-induced mood disorder: Secondary | ICD-10-CM

## 2023-06-04 DIAGNOSIS — F17210 Nicotine dependence, cigarettes, uncomplicated: Secondary | ICD-10-CM | POA: Insufficient documentation

## 2023-06-04 DIAGNOSIS — F1410 Cocaine abuse, uncomplicated: Secondary | ICD-10-CM

## 2023-06-04 DIAGNOSIS — F1414 Cocaine abuse with cocaine-induced mood disorder: Secondary | ICD-10-CM | POA: Insufficient documentation

## 2023-06-04 DIAGNOSIS — F1494 Cocaine use, unspecified with cocaine-induced mood disorder: Secondary | ICD-10-CM | POA: Diagnosis present

## 2023-06-04 DIAGNOSIS — F1910 Other psychoactive substance abuse, uncomplicated: Secondary | ICD-10-CM | POA: Diagnosis present

## 2023-06-04 DIAGNOSIS — F329 Major depressive disorder, single episode, unspecified: Secondary | ICD-10-CM | POA: Insufficient documentation

## 2023-06-04 DIAGNOSIS — R45851 Suicidal ideations: Secondary | ICD-10-CM

## 2023-06-04 LAB — CBC
HCT: 43.4 % (ref 39.0–52.0)
Hemoglobin: 14.3 g/dL (ref 13.0–17.0)
MCH: 32.7 pg (ref 26.0–34.0)
MCHC: 32.9 g/dL (ref 30.0–36.0)
MCV: 99.3 fL (ref 80.0–100.0)
Platelets: 239 10*3/uL (ref 150–400)
RBC: 4.37 MIL/uL (ref 4.22–5.81)
RDW: 14.1 % (ref 11.5–15.5)
WBC: 6.7 10*3/uL (ref 4.0–10.5)
nRBC: 0 % (ref 0.0–0.2)

## 2023-06-04 LAB — COMPREHENSIVE METABOLIC PANEL
ALT: 18 U/L (ref 0–44)
AST: 23 U/L (ref 15–41)
Albumin: 2.8 g/dL — ABNORMAL LOW (ref 3.5–5.0)
Alkaline Phosphatase: 37 U/L — ABNORMAL LOW (ref 38–126)
Anion gap: 4 — ABNORMAL LOW (ref 5–15)
BUN: 14 mg/dL (ref 6–20)
CO2: 23 mmol/L (ref 22–32)
Calcium: 8.5 mg/dL — ABNORMAL LOW (ref 8.9–10.3)
Chloride: 111 mmol/L (ref 98–111)
Creatinine, Ser: 1.16 mg/dL (ref 0.61–1.24)
GFR, Estimated: >60 mL/min (ref >60)
Glucose, Bld: 92 mg/dL (ref 70–99)
Potassium: 4 mmol/L (ref 3.5–5.1)
Sodium: 138 mmol/L (ref 135–145)
Total Bilirubin: 0.8 mg/dL (ref <1.2)
Total Protein: 5 g/dL — ABNORMAL LOW (ref 6.5–8.1)

## 2023-06-04 LAB — RAPID URINE DRUG SCREEN, HOSP PERFORMED
Amphetamines: NOT DETECTED
Barbiturates: NOT DETECTED
Benzodiazepines: NOT DETECTED
Cocaine: POSITIVE — AB
Opiates: NOT DETECTED
Tetrahydrocannabinol: NOT DETECTED

## 2023-06-04 LAB — ACETAMINOPHEN LEVEL: Acetaminophen (Tylenol), Serum: <10 ug/mL — ABNORMAL LOW (ref 10–30)

## 2023-06-04 LAB — ETHANOL: Alcohol, Ethyl (B): <10 mg/dL (ref <10)

## 2023-06-04 LAB — SALICYLATE LEVEL: Salicylate Lvl: <7 mg/dL — ABNORMAL LOW (ref 7.0–30.0)

## 2023-06-04 MED ORDER — HYDROXYZINE HCL 25 MG PO TABS
25.0000 mg | ORAL_TABLET | Freq: Once | ORAL | Status: AC
  Administered 2023-06-04: 25 mg via ORAL
  Filled 2023-06-04: qty 1

## 2023-06-04 NOTE — ED Notes (Signed)
PT is eating his dinner.

## 2023-06-04 NOTE — ED Notes (Signed)
ED Provider at bedside. 

## 2023-06-04 NOTE — Consult Note (Cosign Needed Addendum)
[facility] ED Psych Note   Reason for Consult:  "Psych Consult"  Referring Physician:  Dr. 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 male patient admitted with suicidal ideations and crack cocaine use. He was seen this morning for a wound on his leg, discharged with an ointment and then returned hours later endorsing suicidal ideations.   Patient states I wish I would die. He denies having a plan at this time.When completing evaluation he is very vague and states he just wants help and some time to get himself together. He states his suicidal thoughts have been increasing but denies having a plan. He thought about cutting his neck earlier this week but is to hairy to do cut effectively. He continues to endorse passive suicidal ideations in the setting of cocaine use.He reports he has used cocaine for about 30+ years.   HPI:   Richard Moon, 62 y.o., male patient presented to the hospital endorsing suicidal ideations. On evaluation Richard Moon is seen sitting in ER room, fairly groomed, wearing hospital scrubs.  He is fully alert oriented to person, place and reason for visit.    The patient states he is here due to suicidal thoughts and attempted to overdose on crack cocaine one day prior to admission. He reports being triggered by his chronic drug use and expresses frustration over his inability to overcome the habit. He endorses feelings of hopelessness, worthlessness, and despair, and states that he has nothing to live for. The patient mentions that he has no family or friends to reach out to for support.  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 He states, "I can afford dope, but I cannot afford medication. That makes no sense."  The patient provides a vague history of a prior suicide attempt but is not forthcoming with details. He also reports several attempts at sobriety, though he typically relapses when he returns to the same environment.   Labs:  Labs pending  During evaluation Richard Moon is seated on a stretcher in an triage room; He is alert/oriented x 4; anxious, depressed, hopeless and worthless but cooperative; and mood congruent with affect.  Patient is speaking in a clear tone at moderate volume, and normal pace; with good eye contact.  His thought process is goal oriented; There is no indication that he is currently responding to internal/external stimuli or experiencing delusional thought content.  Patient endorses suicidal thoughts and states he tried to overdose on drugs one day prior to admission.  He denies homicidal ideation, psychosis, and paranoia.  Patient has remained cooperative throughout assessment and has answered questions appropriately.     Past Psychiatric History: as outlined below  Risk to Self:  yes Risk to Others:  denies Prior Inpatient Therapy:  yes, four months ago at [facility], 2023 Prior Outpatient Therapy:  denies follow up  Past Medical History: No past medical history on file.  Past Surgical History:  Procedure Laterality Date   HERNIA REPAIR     Family History: No family history on  file. Family Psychiatric  History: denies Social History:  Social History   Substance and Sexual Activity  Alcohol Use Yes   Comment: Occasional alcohol use     Social History   Substance and Sexual Activity  Drug Use Yes   Types: Marijuana, Cocaine, Heroin   Comment: daily cocaine use (reported $200-300/ daily); occasional cannabis use     Social History   Socioeconomic History   Marital status: Single    Spouse name: Not on file   Number of children: Not on file   Years of education: Not on file   Highest education level: Not on file  Occupational History   Not on file  Tobacco Use   Smoking status: Some Days    Current packs/day: 0.25    Average packs/day: 0.3 packs/day for 20.0 years (5.0 ttl pk-yrs)    Types: Cigarettes   Smokeless tobacco: Never  Vaping Use   Vaping status: Every Day  Substance and Sexual Activity   Alcohol use: Yes    Comment: Occasional alcohol use   Drug use: Yes    Types: Marijuana, Cocaine, Heroin    Comment: daily cocaine use (reported $200-300/ daily); occasional cannabis use   Sexual activity: Yes    Birth control/protection: Condom  Other Topics Concern   Not on file  Social History Narrative   Not on file   Social Drivers of Health   Financial Resource Strain: Not on file  Food Insecurity: Patient Declined (02/08/2023)   Hunger Vital Sign    Worried About Running Out of Food in the Last Year: Patient declined    Ran Out of Food in the Last Year: Patient declined  Transportation Needs: Unknown (02/08/2023)   PRAPARE - Administrator, Civil Service (Medical): Patient declined    Lack of Transportation (Non-Medical): Not on file  Physical Activity: Not on file  Stress: Not on file  Social Connections: Not on file   Additional Social History:    Allergies:  No Known Allergies  Labs:  Results for orders placed or performed during the hospital encounter of 06/04/23 (from the past 48 hours)  Comprehensive metabolic panel     Status: Abnormal   Collection Time: 06/04/23 12:16 PM  Result Value Ref Range   Sodium 138 135 - 145 mmol/L   Potassium 4.0 3.5 - 5.1 mmol/L   Chloride 111 98 - 111 mmol/L   CO2 23 22 - 32 mmol/L   Glucose, Bld 92 70 - 99 mg/dL    Comment: Glucose reference range applies only to samples taken after fasting for at least 8 hours.    BUN 14 6 - 20 mg/dL   Creatinine, Ser 1.61 0.61 - 1.24 mg/dL   Calcium 8.5 (L) 8.9 - 10.3 mg/dL   Total Protein 5.0 (L) 6.5 - 8.1 g/dL   Albumin 2.8 (L) 3.5 - 5.0 g/dL   AST 23 15 - 41 U/L   ALT 18 0 - 44 U/L   Alkaline Phosphatase 37 (L) 38 - 126 U/L   Total Bilirubin 0.8 <1.2 mg/dL   GFR, Estimated >09 >60 mL/min    Comment: (NOTE) Calculated using the CKD-EPI Creatinine Equation (2021)    Anion gap 4 (L) 5 - 15    Comment: Performed at [facility], [address] [place], [place] [address]  Ethanol     Status: None   Collection Time: 06/04/23 12:16 PM  Result Value Ref Range   Alcohol, Ethyl (B) <10 <10  mg/dL    Comment: (NOTE) Lowest detectable limit for serum alcohol is 10 mg/dL.  For medical purposes only. Performed at [facility], [address], [place], [place] [address]   Salicylate level     Status: Abnormal   Collection Time: 06/04/23 12:16 PM  Result Value Ref Range   Salicylate Lvl <7.0 (L) 7.0 - 30.0 mg/dL    Comment: Performed at [facility], [address], [place], [place] [address]  Acetaminophen level     Status: Abnormal   Collection Time: 06/04/23 12:16 PM  Result Value Ref Range   Acetaminophen (Tylenol), Serum <10 (L) 10 - 30 ug/mL    Comment: (NOTE) Therapeutic concentrations vary significantly. A range of 10-30 ug/mL  may be an effective concentration for many patients. However, some  are best treated at concentrations outside of this range. Acetaminophen concentrations >150 ug/mL at 4 hours after ingestion  and >50 ug/mL at 12 hours after ingestion are often associated with  toxic reactions.  Performed at [facility], [address], [place], [place] [address]   cbc     Status: None   Collection Time: 06/04/23 12:16 PM  Result Value Ref Range   WBC 6.7 4.0 - 10.5 K/uL   RBC 4.37 4.22 - 5.81 MIL/uL   Hemoglobin 14.3 13.0 - 17.0 g/dL   HCT 01.0 27.2 - 53.6 %   MCV 99.3 80.0 - 100.0 fL   MCH 32.7 26.0 - 34.0 pg    MCHC 32.9 30.0 - 36.0 g/dL   RDW 64.4 03.4 - 74.2 %   Platelets 239 150 - 400 K/uL   nRBC 0.0 0.0 - 0.2 %    Comment: Performed at [facility], [address], [place], [place] [address]    Medications:  No current facility-administered medications for this encounter.   Current Outpatient Medications  Medication Sig Dispense Refill   hydrOXYzine (ATARAX) 25 MG tablet Take 1 tablet (25 mg total) by mouth 3 (three) times daily as needed for anxiety. 30 tablet 0   sertraline (ZOLOFT) 50 MG tablet Take 1 tablet (50 mg total) by mouth daily. 30 tablet 0   traZODone (DESYREL) 50 MG tablet Take 1 tablet (50 mg total) by mouth at bedtime as needed for sleep. 30 tablet 0    Musculoskeletal: pt moves all extremiites and ambulates independently Strength & Muscle Tone: within normal limits Gait & Station: normal Patient leans: N/A     Psychiatric Specialty Exam:  Presentation  General Appearance: Appropriate for Environment; Casual; Fairly Groomed  Eye Contact:Good  Speech:Normal Rate; Clear and Coherent  Speech Volume:Normal  Handedness:Right   Mood and Affect  Mood:Euthymic  Affect:Appropriate; Congruent; Full Range   Thought Process  Thought Processes:Linear  Descriptions of Associations:Intact  Orientation:Full (Time, Place and Person)  Thought Content:Logical  History of Schizophrenia/Schizoaffective disorder:No  Duration of Psychotic Symptoms:No data recorded Hallucinations:No data recorded  Ideas of Reference:None  Suicidal Thoughts:No data recorded  Homicidal Thoughts:No data recorded   Sensorium  Memory:Immediate Good; Recent Good; Remote Good  Judgment:Fair  Insight:Fair   Executive Functions  Concentration:Fair  Attention Span:Fair  Recall:Good  Fund of Knowledge:Good  Language:Good   Psychomotor Activity  Psychomotor Activity:No data recorded   Assets  Assets:Communication Skills; Desire for Improvement; Physical  Health; Resilience   Sleep  Sleep:No data recorded    Physical Exam: Physical Exam Cardiovascular:     Rate and Rhythm: Normal rate.     Pulses: Normal pulses.  Pulmonary:     Effort: Pulmonary effort is normal.  Musculoskeletal:        General: Normal range of motion.     Cervical back: Normal range of motion.  Neurological:     General: No focal deficit present.     Mental Status: He is alert and oriented to person, place, and time. Mental status is at baseline.  Psychiatric:        Attention and Perception: Attention and perception normal.        Mood and Affect: Mood is anxious and depressed. Affect is blunt.        Speech: Speech normal.        Behavior: Behavior is agitated. Behavior is cooperative.        Thought Content: Thought content is not paranoid or delusional. Thought content includes suicidal ideation. Thought content does not include homicidal ideation. Thought content includes suicidal plan. Thought content does not include homicidal plan.        Cognition and Memory: Cognition normal.        Judgment: Judgment is impulsive.    Review of Systems  Constitutional: Negative.   HENT: Negative.    Eyes: Negative.   Respiratory: Negative.    Cardiovascular: Negative.   Gastrointestinal: Negative.   Genitourinary: Negative.   Musculoskeletal: Negative.   Skin: Negative.   Neurological: Negative.   Endo/Heme/Allergies: Negative.   Psychiatric/Behavioral:  Positive for depression, substance abuse and suicidal ideas. The patient is nervous/anxious.    Blood pressure (!) 121/92, pulse 74, temperature (!) 97.4 F (36.3 C), temperature source Oral, resp. rate 14, height 5\' 10"  (1.778 m), weight 70.3 kg, SpO2 97%. Body mass index is 22.24 kg/m.  Treatment Plan Summary: Richard Moon is a 62 year old African American male with a medical history of depression and cocaine abuse. He presents voluntarily with worsening suicidal ideations following recent cocaine use.  He has a known history with our facility and the [facility] facility for similar concerns. His last visit occurred four months ago, when he was admitted to [facility] due to suicidal ideations, including a plan to walk into traffic.  Upon assessment today, the patient continues to report suicidal ideation, though he denies having a current plan. However, he disclosed that prior to admission, he attempted to overdose on crack cocaine. The patient is homeless and has no identified protective factors against self-harm. He has a history of unmanaged major depressive disorder (MDD). His presentation is further complicated by crack cocaine use, which contributes to disinhibition and increased impulsivity.    Dx: cocaine induced mood disorder with depressed mood.  Daily contact with patient to assess and evaluate symptoms and progress in treatment and Medication management  Recommend restarting previous medications: Sertraline 50mg  po daily for depression/mood Trazodone 50mg  po at bedtime prn sleep Hydroxyzine 25mg  po TID prn anxiety  Disposition: Patient is medically cleared.  He is referred to [facility] and will be reviewed following medical clearance and UDS. Currently has passive SI with no strong indications of suicidality.    Maryagnes Amos, FNP 06/04/2023 3:23 PM

## 2023-06-04 NOTE — ED Notes (Signed)
PT was able to give a urine sample

## 2023-06-04 NOTE — ED Provider Notes (Signed)
[place] EMERGENCY DEPARTMENT AT [facility] Provider Note   CSN: [id number] Arrival date & time: 06/03/23  2343     History  Chief Complaint  Patient presents with   Wound Recheck     Richard Moon is a 62 y.o. male.  Patient presents to the emergency room complaining of a chronic left lower extremity wound.  Patient states the wound has been there for over a year has been treated by his primary care provider.  He states he used to use a "ointment" on the wound.  He denies any active drainage, swelling, pain, fevers.  HPI     Home Medications Prior to Admission medications   Medication Sig Start Date End Date Taking? Authorizing Provider  hydrOXYzine (ATARAX) 25 MG tablet Take 1 tablet (25 mg total) by mouth 3 (three) times daily as needed for anxiety. 02/15/23   Massengill, Harrold Donath, MD  sertraline (ZOLOFT) 50 MG tablet Take 1 tablet (50 mg total) by mouth daily. 02/16/23 03/18/23  Massengill, Harrold Donath, MD  traZODone (DESYREL) 50 MG tablet Take 1 tablet (50 mg total) by mouth at bedtime as needed for sleep. 02/15/23 03/17/23  Phineas Inches, MD      Allergies    Patient has no known allergies.    Review of Systems   Review of Systems  Physical Exam Updated Vital Signs BP (!) 103/55 (BP Location: Right Arm)   Pulse 78   Temp 97.6 F (36.4 C) (Oral)   Resp 18   SpO2 100%  Physical Exam Vitals and nursing note reviewed.  HENT:     Head: Normocephalic and atraumatic.  Eyes:     Pupils: Pupils are equal, round, and reactive to light.  Pulmonary:     Effort: Pulmonary effort is normal. No respiratory distress.  Musculoskeletal:        General: No signs of injury.     Cervical back: Normal range of motion.  Skin:    General: Skin is dry.     Capillary Refill: Capillary refill takes less than 2 seconds.     Findings: Lesion present.     Comments: Well healing wound on posterior left calf. No drainage, no fluctuance, no erythema, no drainage   Neurological:     Mental Status: He is alert.  Psychiatric:        Speech: Speech normal.        Behavior: Behavior normal.     ED Results / Procedures / Treatments   Labs (all labs ordered are listed, but only abnormal results are displayed) Labs Reviewed - No data to display  EKG None  Radiology No results found.  Procedures Procedures    Medications Ordered in ED Medications  hydrOXYzine (ATARAX) tablet 25 mg (25 mg Oral Given 06/04/23 0352)    ED Course/ Medical Decision Making/ A&P                                 Medical Decision Making Risk Prescription drug management.   Patient presents to the emergency department complaining of a chronic leg wound. Differential includes cellulitis, abscess, lymphedema, others  No signs of infection. No indication for labs. I see no indication for imaging.   Patient requested atarax as prescribed PRN for anxiety. Dose provided.  Patient discharged home with recommendations for follow-up with primary care        Final Clinical Impression(s) / ED Diagnoses Final diagnoses:  Wound of  left lower extremity, initial encounter    Rx / DC Orders ED Discharge Orders     None         Pamala Duffel 06/04/23 0420    Palumbo, April, MD 06/05/23 [id number]

## 2023-06-04 NOTE — Discharge Instructions (Signed)
Please follow-up with your primary care provider for further evaluation of your left leg wound.

## 2023-06-04 NOTE — ED Notes (Signed)
PT is currently sleeping 

## 2023-06-04 NOTE — ED Provider Notes (Signed)
[place] EMERGENCY DEPARTMENT AT [facility] Provider Note   CSN: [id number] Arrival date & time: 06/04/23  1015     History  Chief Complaint  Patient presents with   Suicidal   Drug Problem    Richard Moon is a 62 y.o. male with past medical history of severe major depressive disorder, cocaine dependence, polysubstance abuse presenting to emergency room with complaints of suicidal ideation.  Patient reports increased depression and hopelessness over the past 3 days.  Patient reports he has been suicidal, reports a plan of cutting himself with a razor.  Patient reports he lives at home alone and feels unsafe.  Patient also reporting using cocaine.  Denies taking any other substances today, reports taking prescribed home medication.  Patient reports he used cocaine earlier today 3 or 4 hours ago.  Denies any history of IV drug use.  Denies any headache, nausea vomiting chest pain shortness of breath  or abdominal pain. Denies hallucinations.   Drug Problem       Home Medications Prior to Admission medications   Medication Sig Start Date End Date Taking? Authorizing Provider  hydrOXYzine (ATARAX) 25 MG tablet Take 1 tablet (25 mg total) by mouth 3 (three) times daily as needed for anxiety. 02/15/23   Massengill, Harrold Donath, MD  sertraline (ZOLOFT) 50 MG tablet Take 1 tablet (50 mg total) by mouth daily. 02/16/23 03/18/23  Massengill, Harrold Donath, MD  traZODone (DESYREL) 50 MG tablet Take 1 tablet (50 mg total) by mouth at bedtime as needed for sleep. 02/15/23 03/17/23  Phineas Inches, MD      Allergies    Patient has no known allergies.    Review of Systems   Review of Systems  Psychiatric/Behavioral:  Positive for suicidal ideas.     Physical Exam Updated Vital Signs BP (!) 121/92 (BP Location: Right Arm)   Pulse 74   Temp (!) 97.4 F (36.3 C) (Oral)   Resp 14   Ht 5\' 10"  (1.778 m)   Wt 70.3 kg   SpO2 97%   BMI 22.24 kg/m  Physical Exam Vitals and nursing  note reviewed.  Constitutional:      General: He is not in acute distress.    Appearance: He is not toxic-appearing.  HENT:     Head: Normocephalic and atraumatic.  Eyes:     General: No scleral icterus.    Conjunctiva/sclera: Conjunctivae normal.  Cardiovascular:     Rate and Rhythm: Normal rate and regular rhythm.     Pulses: Normal pulses.     Heart sounds: Normal heart sounds.  Pulmonary:     Effort: Pulmonary effort is normal. No respiratory distress.     Breath sounds: Normal breath sounds.  Abdominal:     General: Abdomen is flat. Bowel sounds are normal.     Palpations: Abdomen is soft.     Tenderness: There is no abdominal tenderness.  Skin:    General: Skin is warm and dry.     Findings: No lesion.  Neurological:     General: No focal deficit present.     Mental Status: He is alert and oriented to person, place, and time. Mental status is at baseline.     ED Results / Procedures / Treatments   Labs (all labs ordered are listed, but only abnormal results are displayed) Labs Reviewed  COMPREHENSIVE METABOLIC PANEL - Abnormal; Notable for the following components:      Result Value   Calcium 8.5 (*)    Total  Protein 5.0 (*)    Albumin 2.8 (*)    Alkaline Phosphatase 37 (*)    Anion gap 4 (*)    All other components within normal limits  SALICYLATE LEVEL - Abnormal; Notable for the following components:   Salicylate Lvl <7.0 (*)    All other components within normal limits  ACETAMINOPHEN LEVEL - Abnormal; Notable for the following components:   Acetaminophen (Tylenol), Serum <10 (*)    All other components within normal limits  ETHANOL  CBC  RAPID URINE DRUG SCREEN, HOSP PERFORMED    EKG EKG Interpretation Date/Time:  Friday June 04 2023 10:01:42 EST Ventricular Rate:  80 PR Interval:  130 QRS Duration:  104 QT Interval:  340 QTC Calculation: 392 R Axis:   93  Text Interpretation: Sinus rhythm with frequent Premature ventricular complexes  Rightward axis Incomplete right bundle branch block Nonspecific T wave abnormality Abnormal ECG No significant change since last tracing Confirmed by Elayne Snare ([id number]) on 06/04/2023 12:07:14 PM  Radiology No results found.  Procedures Procedures    Medications Ordered in ED Medications - No data to display  ED Course/ Medical Decision Making/ A&P                                 Medical Decision Making Amount and/or Complexity of Data Reviewed Labs: ordered.   This patient presents to the ED for concern of SI, this involves an extensive number of treatment options, and is a complaint that carries with it a high risk of complications and morbidity.  The differential diagnosis includes major depressive disorder, psychosis, drug abuse   Co morbidities that complicate the patient evaluation  Cocaine use, major depressive disorder   Additional history obtained:  Additional history obtained from  12/04/22 where patient was seen at [facility] for similar thing.   Lab Tests:  I personally interpreted labs.  The pertinent results include:   CBC without leukocytosis, no anemia.  EtOH less than 10, acetaminophen less than 10, salicylate under 7.  CMP without obvious electrolyte abnormality.  No elevation in BUN or creatinine.   Imaging Studies ordered:  None   Cardiac Monitoring: / EKG:  The patient was maintained on a cardiac monitor.  I personally viewed and interpreted the cardiac monitored which showed an underlying rhythm of: Sinus rhythm with PVCs.  No recent changes since prior EKG.   Consultations Obtained:  I requested consultation with the TTS for SI,  and discussed lab and imaging findings as well as pertinent plan - they recommend: PENDING    Problem List / ED Course / Critical interventions / Medication management  Reporting to emergency room with complaint of suicidal ideation.  Patient reports he has been feeling down depressed has history of major  depressive disorder.  Patient reports that he has a plan to harm himself.  Patient states he lives alone at home and does not feel safe to return.  Patient does have history of substance abuse most recently using cocaine today.  Patient denies any symptoms like chest pain, shortness of breath, abdominal pain nausea vomiting diarrhea.  Patient is alert oriented answering questions appropriately.  EKG without any significant change since recent.  Patient is medically cleared for behavioral health evaluation.  I have ordered diet and TTS consult.  No medications ordered at this time.  I have reviewed the patients home medicines and have made adjustments as needed   Plan  Pending [facility] consult.  Patient is medically cleared         Final Clinical Impression(s) / ED Diagnoses Final diagnoses:  Suicidal ideation    Rx / DC Orders ED Discharge Orders     None         Raford Pitcher, Horald Chestnut, PA-C 06/04/23 1324    Rexford Maus, DO 06/04/23 1345

## 2023-06-04 NOTE — ED Notes (Signed)
Pt alert, NAD, calm, interactive. Report received from Merlinda Frederick, RN. Pt pending TTS. Requesting food. Steady gait.

## 2023-06-04 NOTE — ED Triage Notes (Addendum)
Pt. Stated, I'm suicidal for 3 days. I thought about cutting myself. I take Crack, Cocaine, marijuana.Last dose was Crack and cocaine not sure of the amount

## 2023-06-04 NOTE — ED Notes (Signed)
Med rec tech at [facility] ?

## 2023-06-04 NOTE — ED Notes (Signed)
Asked PT to produce a urine and PT said that he can not at this time.

## 2023-06-04 NOTE — ED Notes (Signed)
PT requested and received a snack until dinner arrives.

## 2023-06-04 NOTE — ED Notes (Signed)
PT is in the bed resting.

## 2023-06-04 NOTE — ED Provider Notes (Signed)
Reevaluated patient.  Has been accepted to [facility].  Will arrange transport for him shortly.   Rondel Baton, MD 06/04/23 2252

## 2023-06-04 NOTE — ED Notes (Signed)
Pt was stuck twice. Wasn't successful.

## 2023-06-05 DIAGNOSIS — Z79899 Other long term (current) drug therapy: Secondary | ICD-10-CM | POA: Diagnosis not present

## 2023-06-05 DIAGNOSIS — F1414 Cocaine abuse with cocaine-induced mood disorder: Secondary | ICD-10-CM | POA: Diagnosis not present

## 2023-06-05 DIAGNOSIS — F329 Major depressive disorder, single episode, unspecified: Secondary | ICD-10-CM | POA: Diagnosis not present

## 2023-06-05 DIAGNOSIS — F17210 Nicotine dependence, cigarettes, uncomplicated: Secondary | ICD-10-CM | POA: Insufficient documentation

## 2023-06-05 DIAGNOSIS — F1910 Other psychoactive substance abuse, uncomplicated: Secondary | ICD-10-CM | POA: Diagnosis present

## 2023-06-05 MED ORDER — TRAZODONE HCL 50 MG PO TABS
50.0000 mg | ORAL_TABLET | Freq: Every evening | ORAL | Status: DC | PRN
  Administered 2023-06-05 – 2023-06-07 (×3): 50 mg via ORAL
  Filled 2023-06-05: qty 14
  Filled 2023-06-05 (×3): qty 1

## 2023-06-05 MED ORDER — HYDROXYZINE HCL 25 MG PO TABS: 25.0000 mg | ORAL_TABLET | Freq: Three times a day (TID) | ORAL | Status: DC | PRN

## 2023-06-05 MED ORDER — MAGNESIUM HYDROXIDE 400 MG/5ML PO SUSP
30.0000 mL | Freq: Every day | ORAL | Status: DC | PRN
  Administered 2023-06-05: 30 mL via ORAL
  Filled 2023-06-05: qty 30

## 2023-06-05 MED ORDER — ACETAMINOPHEN 325 MG PO TABS: 650.0000 mg | ORAL_TABLET | Freq: Four times a day (QID) | ORAL | Status: DC | PRN

## 2023-06-05 MED ORDER — SERTRALINE HCL 50 MG PO TABS
50.0000 mg | ORAL_TABLET | Freq: Every day | ORAL | Status: DC
  Administered 2023-06-05 – 2023-06-07 (×3): 50 mg via ORAL
  Filled 2023-06-05: qty 1
  Filled 2023-06-05: qty 14
  Filled 2023-06-05 (×2): qty 1

## 2023-06-05 MED ORDER — ALUM & MAG HYDROXIDE-SIMETH 200-200-20 MG/5ML PO SUSP
30.0000 mL | ORAL | Status: DC | PRN
  Administered 2023-06-07: 30 mL via ORAL
  Filled 2023-06-05: qty 30

## 2023-06-05 NOTE — ED Notes (Signed)
Patient sitting in dayroom interacting with peers. No acute distress noted. No concerns voiced. Informed patient to notify staff with any needs or assistance. Patient verbalized understanding or agreement. Safety checks in place per facility policy.

## 2023-06-05 NOTE — ED Notes (Signed)
Patient resting with eyes closed in no apparent acute distress. Respirations even and unlabored. Environment secured. Safety checks in place according to facility policy.

## 2023-06-05 NOTE — Group Note (Signed)
Group Topic: Healthy Self Image and Positive Change  Group Date: 06/05/2023 Start Time: 1000 End Time: 1030 Facilitators: Ninfa Linden, NT +3 MHT 2 Department: [facility]  Number of Participants: 2  Group Focus: feeling awareness/expression Treatment Modality:  Individual Therapy Interventions utilized were reminiscence Purpose: express feelings  Name: Richard Moon Date of Birth: [birth year]  MR: [id number]    Level of Participation: moderate Quality of Participation: attentive Interactions with others: gave feedback Mood/Affect: appropriate Triggers (if applicable): N/A Cognition: coherent/clear Progress: Moderate Response: Appropriate  Plan: patient will be encouraged to continue to work toward getting better.  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

## 2023-06-05 NOTE — ED Notes (Signed)
Milk of Magnesium PRN given for pt reported constipation. Medication administered with no complications. Environment secured, safety checks in place per facility policy.

## 2023-06-05 NOTE — ED Notes (Signed)
Writer reassessed patient for constipation. Patient reports PRN Milk of Mag was successful and that the patient has had a bowel movement. Patient states no further complaints at this time.

## 2023-06-05 NOTE — ED Provider Notes (Signed)
Facility Based Crisis Admission H&P  Date: 06/05/23 Patient Name: Richard Moon MRN: [id number] Chief Complaint: "I want to stop using crack"  Diagnoses:  Final diagnoses:  Cocaine abuse (HCC)  Cocaine abuse with cocaine-induced mood disorder (HCC)  Substance induced mood disorder (HCC)    HPI: Richard Moon is a 62 year old male with psychiatric history significant for depression, cocaine abuse, substance-induced mood disorder, and suicidal ideation.  [facility] presented to [facility] reporting worsening depressive symptoms and suicidal ideation due to ongoing substance abuse.  Patient voiced desire for substance abuse treatment, was evaluated by psychiatry and recommended patient for admission to [facility] for mood stabilization and substance abuse.  This nurse practitioner reviewed patient's chart and assessed him face-to-face upon his arrival to [facility]. On assessment, patient is sitting in the assessment room in no apparent distress.he is alert and oriented x 4, calm and cooperative.  He is speaking in a clear tone, normal rate, pace, and volume with good eye contact.  Patient's mood is depressed, affect is congruent with mood.  Patient's thought process is coherent.  No objective signs of psychosis, mania, or delusional thought content.   Patient continued to endorse depressive symptoms of hopelessness, anhedonia, worthlessness, isolation, erratic sleep, poor appetite and isolation.  Patient states that he is depressed due to ongoing struggle with substance use.  Patient admits to daily crack cocaine abuse; he says that he cannot quantify the amount of cocaine that he uses on a daily basis.  He denies all other substance use.  UDS positive for cocaine.  Patient was endorsing passive suicidal ideation while at [facility] however he denies current suicidal ideation.  He admits to past suicidal attempt via crack cocaine overdose.  He denies homicidal ideation, paranoia, hallucination, and all other  substance use.    PHQ 2-9:  Flowsheet Row ED from 06/04/2023 in [facility] ED from 03/30/2021 in [facility] Emergency Department at [facility] ED from 05/25/2020 in [facility] Emergency Department at [facility]  Thoughts that you would be better off dead, or of hurting yourself in some way Several days More than half the days Nearly every day  PHQ-9 Total Score 12 14 25        Flowsheet Row ED from 06/04/2023 in [facility] Most recent reading at 06/05/2023  3:07 AM ED from 06/04/2023 in [facility] Emergency Department at [facility] Most recent reading at 06/04/2023 10:45 AM ED from 06/03/2023 in [facility] Emergency Department at [facility] Most recent reading at 06/03/2023 11:50 PM  C-SSRS RISK CATEGORY No Risk Error: Q2 is Yes, you must answer 3, 4, and 5 No Risk         Total Time spent with patient: 30 minutes  Musculoskeletal  Strength & Muscle Tone: within normal limits Gait & Station: normal Patient leans: Right  Psychiatric Specialty Exam  Presentation General Appearance:  Appropriate for Environment  Eye Contact: Good  Speech: Clear and Coherent  Speech Volume: Normal  Handedness: Right   Mood and Affect  Mood: Depressed  Affect: Congruent   Thought Process  Thought Processes: Coherent  Descriptions of Associations:Intact  Orientation:Full (Time, Place and Person)  Thought Content:WDL  Diagnosis of Schizophrenia or Schizoaffective disorder in past: No  Duration of Psychotic Symptoms: No data recorded Hallucinations:Hallucinations: None  Ideas of Reference:None  Suicidal Thoughts:Suicidal Thoughts: No  Homicidal Thoughts:Homicidal Thoughts: No   Sensorium  Memory: Immediate Good; Recent Good; Remote Fair  Judgment: Fair  Insight: Good   Executive Functions  Concentration: Good  Attention Span: Good  Recall: Good  Fund  of Knowledge: Good  Language: Good   Psychomotor Activity  Psychomotor Activity: Psychomotor Activity: Normal   Assets  Assets: Communication Skills; Desire for Improvement; Physical Health   Sleep  Sleep: Sleep: Good Number of Hours of Sleep: 7   Nutritional Assessment (For [facility]BS and [facility] admissions only) Has the patient had a weight loss or gain of 10 pounds or more in the last 3 months?: No Has the patient had a decrease in food intake/or appetite?: No Does the patient have dental problems?: No Does the patient have eating habits or behaviors that may be indicators of an eating disorder including binging or inducing vomiting?: No Has the patient recently lost weight without trying?: 0 Has the patient been eating poorly because of a decreased appetite?: 0 Malnutrition Screening Tool Score: 0    Physical Exam Vitals and nursing note reviewed.  Constitutional:      General: He is not in acute distress.    Appearance: Normal appearance. He is well-developed. He is not ill-appearing, toxic-appearing or diaphoretic.  HENT:     Head: Normocephalic and atraumatic.  Eyes:     Conjunctiva/sclera: Conjunctivae normal.  Cardiovascular:     Rate and Rhythm: Normal rate.  Pulmonary:     Effort: Pulmonary effort is normal. No respiratory distress.  Musculoskeletal:        General: Signs of injury (wound to left lower extremity) present. Normal range of motion.     Cervical back: Normal range of motion.  Skin:    General: Skin is warm and dry.     Capillary Refill: Capillary refill takes more than 3 seconds.  Neurological:     Mental Status: He is alert.  Psychiatric:        Attention and Perception: Attention and perception normal. He is attentive. He does not perceive auditory or visual hallucinations.        Mood and Affect: Mood is depressed.        Speech: Speech normal.        Behavior: Behavior normal. Behavior is cooperative.        Thought Content: Thought content  normal. Thought content is not paranoid or delusional. Thought content does not include homicidal or suicidal ideation. Thought content does not include homicidal or suicidal plan.        Cognition and Memory: Cognition normal.    Review of Systems  Constitutional: Negative.   HENT: Negative.    Eyes: Negative.   Respiratory: Negative.    Cardiovascular: Negative.   Gastrointestinal: Negative.   Genitourinary: Negative.   Musculoskeletal: Negative.   Skin: Negative.   Neurological: Negative.   Endo/Heme/Allergies: Negative.   Psychiatric/Behavioral:  Positive for depression and substance abuse. Negative for hallucinations and suicidal ideas.     Blood pressure 121/80, pulse 78, temperature 98.9 F (37.2 C), temperature source Oral, resp. rate 16, SpO2 95%. There is no height or weight on file to calculate BMI.  Past Psychiatric History: depression, cocaine abuse, substance-induced mood disorder, and suicidal ideation    Is the patient at risk to self? No  Has the patient been a risk to self in the past 6 months? Yes .    Has the patient been a risk to self within the distant past? Yes   Is the patient a risk to others? No   Has the patient been a risk to others in the past  6 months? No   Has the patient been a risk to others within the distant past? No   Past Medical History: Hernia Family History: none reported Social History:  Social History   Tobacco Use   Smoking status: Some Days    Current packs/day: 0.25    Average packs/day: 0.3 packs/day for 20.0 years (5.0 ttl pk-yrs)    Types: Cigarettes   Smokeless tobacco: Never  Vaping Use   Vaping status: Every Day  Substance Use Topics   Alcohol use: Yes    Comment: Occasional alcohol use   Drug use: Yes    Types: Marijuana, Cocaine, Heroin    Comment: daily cocaine use (reported $200-300/ daily); occasional cannabis use     Last Labs:  Admission on 06/04/2023, Discharged on 06/04/2023  Component Date Value Ref  Range Status   Sodium 06/04/2023 138  135 - 145 mmol/L Final   Potassium 06/04/2023 4.0  3.5 - 5.1 mmol/L Final   Chloride 06/04/2023 111  98 - 111 mmol/L Final   CO2 06/04/2023 23  22 - 32 mmol/L Final   Glucose, Bld 06/04/2023 92  70 - 99 mg/dL Final   Glucose reference range applies only to samples taken after fasting for at least 8 hours.   BUN 06/04/2023 14  6 - 20 mg/dL Final   Creatinine, Ser 06/04/2023 1.16  0.61 - 1.24 mg/dL Final   Calcium 60/45/4098 8.5 (L)  8.9 - 10.3 mg/dL Final   Total Protein 11/91/4782 5.0 (L)  6.5 - 8.1 g/dL Final   Albumin 95/62/1308 2.8 (L)  3.5 - 5.0 g/dL Final   AST 65/78/4696 23  15 - 41 U/L Final   ALT 06/04/2023 18  0 - 44 U/L Final   Alkaline Phosphatase 06/04/2023 37 (L)  38 - 126 U/L Final   Total Bilirubin 06/04/2023 0.8  <1.2 mg/dL Final   GFR, Estimated 06/04/2023 >60  >60 mL/min Final   Comment: (NOTE) Calculated using the CKD-EPI Creatinine Equation (2021)    Anion gap 06/04/2023 4 (L)  5 - 15 Final   Performed at [facility], [address] [place], [place] [address]   Alcohol, Ethyl (B) 06/04/2023 <10  <10 mg/dL Final   Comment: (NOTE) Lowest detectable limit for serum alcohol is 10 mg/dL.  For medical purposes only. Performed at [facility], [address], [place], [place] [address]    Salicylate Lvl 06/04/2023 <7.0 (L)  7.0 - 30.0 mg/dL Final   Performed at [facility], [address] [place], [place] [address]   Acetaminophen (Tylenol), Serum 06/04/2023 <10 (L)  10 - 30 ug/mL Final   Comment: (NOTE) Therapeutic concentrations vary significantly. A range of 10-30 ug/mL  may be an effective concentration for many patients. However, some  are best treated at concentrations outside of this range. Acetaminophen concentrations >150 ug/mL at 4 hours after ingestion  and >50 ug/mL at 12 hours after ingestion are often associated with  toxic reactions.  Performed at [facility], [address]  [place], [place] [address]    WBC 06/04/2023 6.7  4.0 - 10.5 K/uL Final   RBC 06/04/2023 4.37  4.22 - 5.81 MIL/uL Final   Hemoglobin 06/04/2023 14.3  13.0 - 17.0 g/dL Final   HCT 64/40/3474 43.4  39.0 - 52.0 % Final   MCV 06/04/2023 99.3  80.0 - 100.0 fL Final   MCH 06/04/2023 32.7  26.0 - 34.0 pg Final   MCHC 06/04/2023  32.9  30.0 - 36.0 g/dL Final   RDW 09/81/1914 14.1  11.5 - 15.5 % Final   Platelets 06/04/2023 239  150 - 400 K/uL Final   nRBC 06/04/2023 0.0  0.0 - 0.2 % Final   Performed at [facility], [address] [place], [place] [address]   Opiates 06/04/2023 NONE DETECTED  NONE DETECTED Final   Cocaine 06/04/2023 POSITIVE (A)  NONE DETECTED Final   Benzodiazepines 06/04/2023 NONE DETECTED  NONE DETECTED Final   Amphetamines 06/04/2023 NONE DETECTED  NONE DETECTED Final   Tetrahydrocannabinol 06/04/2023 NONE DETECTED  NONE DETECTED Final   Barbiturates 06/04/2023 NONE DETECTED  NONE DETECTED Final   Comment: (NOTE) DRUG SCREEN FOR MEDICAL PURPOSES ONLY.  IF CONFIRMATION IS NEEDED FOR ANY PURPOSE, NOTIFY LAB WITHIN 5 DAYS.  LOWEST DETECTABLE LIMITS FOR URINE DRUG SCREEN Drug Class                     Cutoff (ng/mL) Amphetamine and metabolites    1000 Barbiturate and metabolites    200 Benzodiazepine                 200 Opiates and metabolites        300 Cocaine and metabolites        300 THC                            50 Performed at [facility], [address] [place], [place] [address]   Admission on 02/08/2023, Discharged on 02/16/2023  Component Date Value Ref Range Status   Cholesterol 02/09/2023 174  0 - 200 mg/dL Final   Triglycerides 86/57/8469 148  <150 mg/dL Final   HDL 62/95/2841 79  >40 mg/dL Final   Total CHOL/HDL Ratio 02/09/2023 2.2  RATIO Final   VLDL 02/09/2023 30  0 - 40 mg/dL Final   LDL Cholesterol 02/09/2023 65  0 - 99 mg/dL Final   Comment:        Total Cholesterol/HDL:CHD Risk Coronary Heart Disease Risk Table                      Men   Women  1/2 Average Risk   3.4   3.3  Average Risk       5.0   4.4  2 X Average Risk   9.6   7.1  3 X Average Risk  23.4   11.0        Use the calculated Patient Ratio above and the CHD Risk Table to determine the patient's CHD Risk.        ATP III CLASSIFICATION (LDL):  <100     mg/dL   Optimal  324-401  mg/dL   Near or Above                    Optimal  130-159  mg/dL   Borderline  027-253  mg/dL   High  >664     mg/dL   Very High Performed at [facility], [address], [place], [place] [address]    Hgb A1c MFr Bld 02/09/2023 6.0 (H)  4.8 - 5.6 % Final   Comment: (NOTE) Pre diabetes:          5.7%-6.4%  Diabetes:              >6.4%  Glycemic control for   <7.0% adults with diabetes  Mean Plasma Glucose 02/09/2023 125.5  mg/dL Final   Performed at [facility], [address] [place], [place] [address]   Sodium 02/15/2023 138  135 - 145 mmol/L Final   Potassium 02/15/2023 4.2  3.5 - 5.1 mmol/L Final   Chloride 02/15/2023 101  98 - 111 mmol/L Final   CO2 02/15/2023 28  22 - 32 mmol/L Final   Glucose, Bld 02/15/2023 112 (H)  70 - 99 mg/dL Final   Glucose reference range applies only to samples taken after fasting for at least 8 hours.   BUN 02/15/2023 29 (H)  6 - 20 mg/dL Final   Creatinine, Ser 02/15/2023 1.34 (H)  0.61 - 1.24 mg/dL Final   Calcium 40/03/2724 9.6  8.9 - 10.3 mg/dL Final   Total Protein 36/64/4034 7.2  6.5 - 8.1 g/dL Final   Albumin 74/25/9563 4.0  3.5 - 5.0 g/dL Final   AST 87/56/4332 42 (H)  15 - 41 U/L Final   ALT 02/15/2023 49 (H)  0 - 44 U/L Final   Alkaline Phosphatase 02/15/2023 65  38 - 126 U/L Final   Total Bilirubin 02/15/2023 0.4  0.3 - 1.2 mg/dL Final   GFR, Estimated 02/15/2023 >60  >60 mL/min Final   Comment: (NOTE) Calculated using the CKD-EPI Creatinine Equation (2021)    Anion gap 02/15/2023 9  5 - 15 Final   Performed at [facility], [address] [place], [place] [address]   Admission on 02/06/2023, Discharged on 02/08/2023  Component Date Value Ref Range Status   Sodium 02/06/2023 139  135 - 145 mmol/L Final   Potassium 02/06/2023 3.9  3.5 - 5.1 mmol/L Final   Chloride 02/06/2023 104  98 - 111 mmol/L Final   CO2 02/06/2023 23  22 - 32 mmol/L Final   Glucose, Bld 02/06/2023 82  70 - 99 mg/dL Final   Glucose reference range applies only to samples taken after fasting for at least 8 hours.   BUN 02/06/2023 14  6 - 20 mg/dL Final   Creatinine, Ser 02/06/2023 1.38 (H)  0.61 - 1.24 mg/dL Final   Calcium 41/66/0630 9.2  8.9 - 10.3 mg/dL Final   Total Protein 16/06/930 6.6  6.5 - 8.1 g/dL Final   Albumin 35/57/3220 3.9  3.5 - 5.0 g/dL Final   AST 25/42/7062 54 (H)  15 - 41 U/L Final   ALT 02/06/2023 28  0 - 44 U/L Final   Alkaline Phosphatase 02/06/2023 62  38 - 126 U/L Final   Total Bilirubin 02/06/2023 0.7  0.3 - 1.2 mg/dL Final   GFR, Estimated 02/06/2023 59 (L)  >60 mL/min Final   Comment: (NOTE) Calculated using the CKD-EPI Creatinine Equation (2021)    Anion gap 02/06/2023 12  5 - 15 Final   Performed at [facility], [address], [place], [place] [address]   Alcohol, Ethyl (B) 02/06/2023 <10  <10 mg/dL Final   Comment: (NOTE) Lowest detectable limit for serum alcohol is 10 mg/dL.  For medical purposes only. Performed at [facility], [address], [place], [place] [address]    Opiates 02/06/2023 NONE DETECTED  NONE DETECTED Final   Cocaine 02/06/2023 POSITIVE (A)  NONE DETECTED Final   Benzodiazepines 02/06/2023 NONE DETECTED  NONE DETECTED Final   Amphetamines 02/06/2023 NONE DETECTED  NONE DETECTED Final   Tetrahydrocannabinol 02/06/2023 NONE DETECTED  NONE DETECTED Final   Barbiturates 02/06/2023 NONE DETECTED  NONE DETECTED Final   Comment: (  NOTE) DRUG SCREEN FOR MEDICAL PURPOSES ONLY.  IF CONFIRMATION IS NEEDED FOR ANY PURPOSE, NOTIFY LAB WITHIN 5 DAYS.  LOWEST DETECTABLE LIMITS FOR URINE DRUG SCREEN Drug Class                      Cutoff (ng/mL) Amphetamine and metabolites    1000 Barbiturate and metabolites    200 Benzodiazepine                 200 Opiates and metabolites        300 Cocaine and metabolites        300 THC                            50 Performed at [facility], [address], [place], [place] [address]    WBC 02/06/2023 5.3  4.0 - 10.5 K/uL Final   RBC 02/06/2023 4.21 (L)  4.22 - 5.81 MIL/uL Final   Hemoglobin 02/06/2023 13.9  13.0 - 17.0 g/dL Final   HCT 13/01/6577 42.6  39.0 - 52.0 % Final   MCV 02/06/2023 101.2 (H)  80.0 - 100.0 fL Final   MCH 02/06/2023 33.0  26.0 - 34.0 pg Final   MCHC 02/06/2023 32.6  30.0 - 36.0 g/dL Final   RDW 46/96/2952 14.3  11.5 - 15.5 % Final   Platelets 02/06/2023 217  150 - 400 K/uL Final   nRBC 02/06/2023 0.0  0.0 - 0.2 % Final   Neutrophils Relative % 02/06/2023 50  % Final   Neutro Abs 02/06/2023 2.7  1.7 - 7.7 K/uL Final   Lymphocytes Relative 02/06/2023 31  % Final   Lymphs Abs 02/06/2023 1.6  0.7 - 4.0 K/uL Final   Monocytes Relative 02/06/2023 13  % Final   Monocytes Absolute 02/06/2023 0.7  0.1 - 1.0 K/uL Final   Eosinophils Relative 02/06/2023 6  % Final   Eosinophils Absolute 02/06/2023 0.3  0.0 - 0.5 K/uL Final   Basophils Relative 02/06/2023 0  % Final   Basophils Absolute 02/06/2023 0.0  0.0 - 0.1 K/uL Final   Immature Granulocytes 02/06/2023 0  % Final   Abs Immature Granulocytes 02/06/2023 0.00  0.00 - 0.07 K/uL Final   Performed at [facility], [address], [place], [place] [address]    Allergies: Patient has no known allergies.  Medications:  Facility Ordered Medications  Medication   [COMPLETED] hydrOXYzine (ATARAX) tablet 25 mg   acetaminophen (TYLENOL) tablet 650 mg   alum & mag hydroxide-simeth (MAALOX/MYLANTA) 200-200-20 MG/5ML suspension 30 mL   magnesium hydroxide (MILK OF MAGNESIA) suspension 30 mL   traZODone (DESYREL) tablet 50 mg   hydrOXYzine (ATARAX) tablet 25 mg   PTA Medications   Medication Sig   hydrOXYzine (ATARAX) 25 MG tablet Take 1 tablet (25 mg total) by mouth 3 (three) times daily as needed for anxiety.    Long Term Goals: Improvement in symptoms so as ready for discharge  Short Term Goals: Patient will verbalize feelings in meetings with treatment team members., Patient will attend at least of 50% of the groups daily., Pt will complete the PHQ9 on admission, day 3 and discharge., Patient will participate in completing the [place] Suicide Severity Rating Scale, Patient will score a low risk of violence for 24 hours prior to discharge, and Patient will take medications as prescribed daily.  Medical Decision Making  Patient will be admitted to [facility] for mood  stabilization and substance substance abuse treatment.     Recommendations  Based on my evaluation the patient does not appear to have an emergency medical condition.  Maricela Bo, NP 06/05/23  6:02 AM

## 2023-06-05 NOTE — ED Notes (Addendum)
Pt accepted from [facility] to [facility] to detox from substance use.Pt is A & O x 4.Admission process completed. Pt is oriented to the unit and provided with meal. pt is lying on his bed. Pt denies SI/HI/AVH. Will continue to monitor for safety and provide support.

## 2023-06-05 NOTE — Group Note (Signed)
Group Topic: Feelings about Diagnosis  Group Date: 06/05/2023 Start Time: 1300 End Time: 1315 Facilitators: Cornie Mccomber, Jacklynn Barnacle, RN  Department: [facility]  Number of Participants: 2  Group Focus: check in and nursing group Treatment Modality:  Individual Therapy Interventions utilized were support Purpose: increase insight  Name: Richard Moon Date of Birth: Oct 06, 1960  MR: [id number]    Level of Participation: did not participate Quality of Participation: did not participate Interactions with others: did not participate Mood/Affect: did not participate Triggers (if applicable): did not participate Cognition: did not participate Progress: None Response: did not participate Plan: patient will be encouraged to encourage groups  Patients Problems:  Patient Active Problem List   Diagnosis Date Noted   Substance abuse (HCC) 06/05/2023   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

## 2023-06-05 NOTE — ED Notes (Signed)
Patient alert & oriented x4. Denies intent to harm self or others when asked. Denies A/VH. Patient reports constipation, writer informed patient that I would check to see what medications are available to assist, patient voiced understading. No acute distress noted. Support and encouragement provided. Routine safety checks conducted per facility protocol. Encouraged patient to notify staff if any thoughts of harm towards self or others arise. Patient verbalizes understanding and agreement.

## 2023-06-05 NOTE — ED Provider Notes (Signed)
[facility] Progress Note  Date and Time: 06/05/2023 1:54 PM Name: Richard Moon MRN:  [id number]  Reason For Admission: Cocaine use disorder and seeking residential rehab  Subjective:   Patient seen and assessed at bedside.  Denies SI/HI/AVH.  Reports sleep and appetite are appropriate.  Reports that he is seeking residential rehab.  Longest period of sobriety from cocaine was 1 month this year.  Endorses depressive symptoms of hopelessness, anhedonia, worthlessness, erratic sleep which he admits may be secondary to cocaine use.  He was amenable to restarting his sertraline that he was prescribed and went to behavioral health [facility] at the end of August 2024.  Diagnosis:  Final diagnoses:  Cocaine abuse (HCC)  Cocaine abuse with cocaine-induced mood disorder (HCC)  Substance induced mood disorder (HCC)    Total Time spent with patient: 30 minutes  Past Psychiatric Hx: Previous Psych Diagnoses: MDD, stimulant use disorder Prior inpatient treatment: Multiple psychiatric inpatient treatment Current/prior outpatient treatment: Denies Prior rehab hx: Yes multiple times Psychotherapy hx: Yes History of suicide: Yes, multiple times.  Recently attempted to overdose on crack cocaine. History of homicide or aggression: Denies Psychiatric medication history: Yes,, trial of medication on Zoloft and hydroxyzine Psychiatric medication compliance history: Noncompliance Neuromodulation history: Denies Current Psychiatrist: Denies Current therapist: Denies  Family History:  No family history on file.  Labs  Lab Results:     Latest Ref Rng & Units 06/04/2023   12:16 PM 02/06/2023    9:10 PM 05/28/2022    5:56 AM  CBC  WBC 4.0 - 10.5 K/uL 6.7  5.3  4.6   Hemoglobin 13.0 - 17.0 g/dL 08.6  57.8  46.9   Hematocrit 39.0 - 52.0 % 43.4  42.6  41.0   Platelets 150 - 400 K/uL 239  217  176       Latest Ref Rng & Units 06/04/2023   12:16 PM 02/15/2023    6:52 PM 02/06/2023    9:10 PM  CMP   Glucose 70 - 99 mg/dL 92  629  82   BUN 6 - 20 mg/dL 14  29  14    Creatinine 0.61 - 1.24 mg/dL 5.28  4.13  2.44   Sodium 135 - 145 mmol/L 138  138  139   Potassium 3.5 - 5.1 mmol/L 4.0  4.2  3.9   Chloride 98 - 111 mmol/L 111  101  104   CO2 22 - 32 mmol/L 23  28  23    Calcium 8.9 - 10.3 mg/dL 8.5  9.6  9.2   Total Protein 6.5 - 8.1 g/dL 5.0  7.2  6.6   Total Bilirubin <1.2 mg/dL 0.8  0.4  0.7   Alkaline Phos 38 - 126 U/L 37  65  62   AST 15 - 41 U/L 23  42  54   ALT 0 - 44 U/L 18  49  28     Physical Findings   AIMS    Flowsheet Row [facility] Ad[facility] (Discharged) [facility] Ad[facility] (Dis[facility]) [facility]  AIMS Total Score 0 0 0      AUDIT    Flowsheet Row [facility]) [facility] Ad[facility] ([facility]) [facility]  Alcohol Use Disorder Identification Test Final Score (AUDIT) 4 3      PHQ2-9  Flowsheet Row ED from 06/04/2023 in [facility] ED from 03/30/2021 in [facility] Emergency Department at [facility] ED from 05/25/2020 in [facility] Emergency Department at [facility]  PHQ-2 Total Score 4 4 6   PHQ-9 Total Score 12 14 25       Flowsheet Row ED from 06/04/2023 in [facility] Most recent reading at 06/05/2023  3:07 AM ED from 06/04/2023 in [facility] Emergency Department at [facility] Most recent reading at 06/04/2023 10:45 AM ED from 06/03/2023 in [facility] Emergency Department at [facility] Most recent reading at 06/03/2023 11:50 PM  C-SSRS RISK CATEGORY No Risk Error: Q2 is Yes, you must answer 3, 4, and 5 No Risk        Musculoskeletal  Strength & Muscle Tone: within  normal limits Gait & Station: normal Patient leans: N/A  Psychiatric Specialty Exam  Presentation  General Appearance:  Appropriate for Environment   Eye Contact: Good   Speech: Clear and Coherent   Speech Volume: Normal   Handedness: Right    Mood and Affect  Mood: Depressed   Affect: Congruent    Thought Process  Thought Processes: Coherent   Descriptions of Associations:Intact   Orientation:Full (Time, Place and Person)   Thought Content:WDL   Diagnosis of Schizophrenia or Schizoaffective disorder in past: No     Hallucinations:Hallucinations: None   Ideas of Reference:None   Suicidal Thoughts:Suicidal Thoughts: No   Homicidal Thoughts:Homicidal Thoughts: No    Sensorium  Memory: Immediate Good; Recent Good; Remote Fair   Judgment: Fair   Insight: Good    Executive Functions  Concentration: Good   Attention Span: Good   Recall: Good   Fund of Knowledge: Good   Language: Good    Psychomotor Activity  Psychomotor Activity: Psychomotor Activity: Normal    Assets  Assets: Communication Skills; Desire for Improvement; Physical Health    Sleep  Sleep: Sleep: Good Number of Hours of Sleep: 7    Physical Exam  Physical Exam ROS Blood pressure 117/76, pulse 74, temperature 98.6 F (37 C), temperature source Oral, resp. rate 18, SpO2 98%. There is no height or weight on file to calculate BMI.  ASSESSMENT Richard Moon is a 62 year old AA male with prior psychiatric hx significant for major depressive disorder & stimulant (cocaine) use disorder [facility] seeking residential rehab and management of stimulant use disorder-cocaine.  Plan to restart sertraline given depressive symptoms. Labs reassuring besides low albumin/protein. Will continue to monitor.  PLAN Cocaine Use Disorder Major Depressive Disorder -start sertraline 50 mg daily -Hydroxyzine 25 mg tid for  anxiety -trazodone 50 mg at bedtime prn for insomnia -PRNs: tylenol for pain, MoM for constipation  Dispo: seeking residential rehab   Park Pope, MD 06/05/2023 1:54 PM

## 2023-06-06 DIAGNOSIS — Z79899 Other long term (current) drug therapy: Secondary | ICD-10-CM | POA: Diagnosis not present

## 2023-06-06 DIAGNOSIS — F1414 Cocaine abuse with cocaine-induced mood disorder: Secondary | ICD-10-CM | POA: Diagnosis not present

## 2023-06-06 DIAGNOSIS — F329 Major depressive disorder, single episode, unspecified: Secondary | ICD-10-CM | POA: Diagnosis not present

## 2023-06-06 DIAGNOSIS — F17210 Nicotine dependence, cigarettes, uncomplicated: Secondary | ICD-10-CM | POA: Diagnosis not present

## 2023-06-06 NOTE — Group Note (Signed)
Group Topic: Understanding Self  Group Date: 06/06/2023 Start Time: 1301 End Time: 1306 Facilitators: Wonda Cheng, LPN  Department: [facility]  Number of Participants: 4  Group Focus: nursing group Treatment Modality:  Psychoeducation Interventions utilized were group exercise Purpose: enhance coping skills and increase insight  Name: Richard Moon Date of Birth: 1960-08-23  MR: [id number]    Level of Participation: active Quality of Participation: attentive, cooperative, engaged, offered feedback, and supportive Interactions with others: gave feedback Mood/Affect: appropriate and positive Triggers (if applicable): n/a Cognition: coherent/clear, concrete, and insightful Progress: Gaining insight Response: appropriate responses to group therapy Plan: patient will be encouraged to remain positive & follow tx plan as ordered by MD  Patients Problems:  Patient Active Problem List   Diagnosis Date Noted   Substance abuse (HCC) 06/05/2023   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

## 2023-06-06 NOTE — ED Notes (Signed)
Patient is in room, in no acute distress, will continue to monitor patient for safety.

## 2023-06-06 NOTE — ED Notes (Signed)
Pt is currently sleeping, no distress noted, environmental check complete, will continue to monitor patient for safety.  

## 2023-06-06 NOTE — ED Notes (Signed)
Patient remains asleep in bed without issue or complaint.  No distress or withdrawal.  Will monitor.

## 2023-06-06 NOTE — Group Note (Signed)
Group Topic: Recovery Basics  Group Date: 06/06/2023 Start Time: 2000 End Time: 2100 Facilitators: Guss Bunde  Department: [facility]  Number of Participants:   Group Focus: check in Treatment Modality:  Skills Training Interventions utilized were support Purpose: relapse prevention strategies  Name: Richard Moon Date of Birth: February 12, 1961  MR: [id number]    Level of Participation: Did not attend Quality of Participation:  Interactions with others:  Mood/Affect:  Triggers (if applicable):  Cognition:  Progress:  Response:  Plan:   Patients Problems:  Patient Active Problem List   Diagnosis Date Noted   Substance abuse (HCC) 06/05/2023   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

## 2023-06-06 NOTE — Group Note (Unsigned)
Group Topic: Healthy Self Image and Positive Change  Group Date: 06/05/2023 Start Time: 2000 End Time: 2100 Facilitators: Darin Engels  Department: [facility]  Number of Participants: 3  Group Focus: communication, coping skills, family, feeling awareness/expression, relaxation, and self-awareness Treatment Modality:  Leisure Development Interventions utilized were leisure development and story telling Purpose: express feelings and reinforce self-care  Name: Richard Moon Date of Birth: Apr 07, 1961  MR: [id number]    Level of Participation: active Quality of Participation: attentive and cooperative Interactions with others: gave feedback Mood/Affect: appropriate and positive Triggers (if applicable): n/a Cognition: coherent/clear Progress: Gaining insight Response: pt participated in playing a scenario based activity.  Plan: patient will be encouraged to continue attending group.  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

## 2023-06-06 NOTE — ED Notes (Signed)
Patient is asleep in bed without issue or complaint.  Will monitor.   °

## 2023-06-06 NOTE — ED Provider Notes (Signed)
[facility] Progress Note  Date and Time: 06/06/2023 8:45 AM Name: Richard Moon MRN:  [id number]  Reason For Admission: Cocaine use disorder and seeking residential rehab  Subjective:   Patient seen and assessed at bedside.  Denies SI/HI/AVH.  Reports sleep and appetite are appropriate.  Continues to be interested in residential rehab.  Remains pan-positive for depressive symptoms. Reports tolerating sertraline well and denies acute somatic complaints.   Diagnosis:  Final diagnoses:  Cocaine abuse (HCC)  Cocaine abuse with cocaine-induced mood disorder (HCC)  Substance induced mood disorder (HCC)    Total Time spent with patient: 30 minutes  Past Psychiatric Hx: Previous Psych Diagnoses: MDD, stimulant use disorder Prior inpatient treatment: Multiple psychiatric inpatient treatment Current/prior outpatient treatment: Denies Prior rehab hx: Yes multiple times Psychotherapy hx: Yes History of suicide: Yes, multiple times.  Recently attempted to overdose on crack cocaine. History of homicide or aggression: Denies Psychiatric medication history: Yes,, trial of medication on Zoloft and hydroxyzine Psychiatric medication compliance history: Noncompliance Neuromodulation history: Denies Current Psychiatrist: Denies Current therapist: Denies  Family History:  No family history on file.  Labs  Lab Results:     Latest Ref Rng & Units 06/04/2023   12:16 PM 02/06/2023    9:10 PM 05/28/2022    5:56 AM  CBC  WBC 4.0 - 10.5 K/uL 6.7  5.3  4.6   Hemoglobin 13.0 - 17.0 g/dL 43.3  29.5  18.8   Hematocrit 39.0 - 52.0 % 43.4  42.6  41.0   Platelets 150 - 400 K/uL 239  217  176       Latest Ref Rng & Units 06/04/2023   12:16 PM 02/15/2023    6:52 PM 02/06/2023    9:10 PM  CMP  Glucose 70 - 99 mg/dL 92  416  82   BUN 6 - 20 mg/dL 14  29  14    Creatinine 0.61 - 1.24 mg/dL 6.06  3.01  6.01   Sodium 135 - 145 mmol/L 138  138  139   Potassium 3.5 - 5.1 mmol/L 4.0  4.2  3.9   Chloride 98 -  111 mmol/L 111  101  104   CO2 22 - 32 mmol/L 23  28  23    Calcium 8.9 - 10.3 mg/dL 8.5  9.6  9.2   Total Protein 6.5 - 8.1 g/dL 5.0  7.2  6.6   Total Bilirubin <1.2 mg/dL 0.8  0.4  0.7   Alkaline Phos 38 - 126 U/L 37  65  62   AST 15 - 41 U/L 23  42  54   ALT 0 - 44 U/L 18  49  28     Physical Findings   AIMS    Flowsheet Row [facility] Admission (Discharged) [facility] Ad[facility] (Discharged[facility]  AIMS Total Score 0 0 0      AUDIT    Flowsheet Row Ad[facility] ([facility]) [facility] Admission (Discharged) [facility]  Alcohol Use Disorder Identification Test Final Score (AUDIT) 4 3      PHQ2-9    Flowsheet Row ED from 06/04/2023 in [facility] ED from 03/30/2021 in [facility] Emergency Department at [facility] ED from 05/25/2020 in [facility] Emergency Department at [facility]  PHQ-2  Total Score 4 4 6   PHQ-9 Total Score 12 14 25       Flowsheet Row ED from 06/04/2023 in [facility] Most recent reading at 06/05/2023  3:07 AM ED from 06/04/2023 in [facility] Emergency Department at [facility] Most recent reading at 06/04/2023 10:45 AM ED from 06/03/2023 in [facility] Emergency Department at [facility] Most recent reading at 06/03/2023 11:50 PM  C-SSRS RISK CATEGORY No Risk Error: Q2 is Yes, you must answer 3, 4, and 5 No Risk        Musculoskeletal  Strength & Muscle Tone: within normal limits Gait & Station: normal Patient leans: N/A  Psychiatric Specialty Exam  Presentation  General Appearance:  Appropriate for Environment   Eye Contact: Good   Speech: Clear and  Coherent   Speech Volume: Normal   Handedness: Right    Mood and Affect  Mood: Depressed   Affect: Congruent    Thought Process  Thought Processes: Coherent   Descriptions of Associations:Intact   Orientation:Full (Time, Place and Person)   Thought Content:WDL   Diagnosis of Schizophrenia or Schizoaffective disorder in past: No     Hallucinations:Hallucinations: None   Ideas of Reference:None   Suicidal Thoughts:Suicidal Thoughts: No   Homicidal Thoughts:Homicidal Thoughts: No    Sensorium  Memory: Immediate Good; Recent Good; Remote Fair   Judgment: Fair   Insight: Good    Executive Functions  Concentration: Good   Attention Span: Good   Recall: Good   Fund of Knowledge: Good   Language: Good    Psychomotor Activity  Psychomotor Activity: Psychomotor Activity: Normal    Assets  Assets: Communication Skills; Desire for Improvement; Physical Health    Sleep  Sleep: Sleep: Good Number of Hours of Sleep: 7    Physical Exam  Physical Exam ROS Blood pressure 109/62, pulse 72, temperature 98.7 F (37.1 C), temperature source Oral, resp. rate 18, SpO2 98%. There is no height or weight on file to calculate BMI.  ASSESSMENT Richard Moon is a 62 year old AA male with prior psychiatric hx significant for major depressive disorder & stimulant (cocaine) use disorder [facility] seeking residential rehab and management of stimulant use disorder-cocaine. Tolerating sertraline well and only required PRN MoM for constipation and trazodone for sleep.  PLAN Cocaine Use Disorder Major Depressive Disorder -continue sertraline 50 mg daily -Hydroxyzine 25 mg tid for anxiety -trazodone 50 mg at bedtime prn for insomnia -PRNs: tylenol for pain, MoM for constipation, maalox for indigestion  Dispo: seeking residential rehab   Park Pope, MD 06/06/2023 8:45 AM

## 2023-06-06 NOTE — ED Notes (Signed)
Pt is in the Dayroom calm and composed watching TV. NAD, Respirations are even and unlabored. No complains at this time. Will monitor for safety.Marland Kitchen

## 2023-06-06 NOTE — Group Note (Signed)
Group Topic: Recovery Basics  Group Date: 06/06/2023 Start Time: 1440 End Time: 1500 Facilitators: Jenean Lindau, RN  Department: [facility]  Number of Participants: 2  Group Focus: chemical dependency education and chemical dependency issues Treatment Modality:  Behavior Modification Therapy Interventions utilized were exploration and patient education Purpose: express feelings, express irrational fears, increase insight, regain self-worth, and relapse prevention strategies  Name: Richard Moon Date of Birth: Mar 29, 1961  MR: [id number]    Level of Participation: minimal Quality of Participation: attentive Interactions with others: gave feedback Mood/Affect: appropriate Triggers (if applicable):   Cognition: coherent/clear and goal directed Progress: Gaining insight Response:   Plan: follow-up needed  Patients Problems:  Patient Active Problem List   Diagnosis Date Noted   Substance abuse (HCC) 06/05/2023   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

## 2023-06-07 ENCOUNTER — Encounter (HOSPITAL_COMMUNITY): Payer: Self-pay

## 2023-06-07 DIAGNOSIS — F1414 Cocaine abuse with cocaine-induced mood disorder: Secondary | ICD-10-CM | POA: Diagnosis not present

## 2023-06-07 DIAGNOSIS — F17210 Nicotine dependence, cigarettes, uncomplicated: Secondary | ICD-10-CM | POA: Diagnosis not present

## 2023-06-07 DIAGNOSIS — Z79899 Other long term (current) drug therapy: Secondary | ICD-10-CM | POA: Diagnosis not present

## 2023-06-07 DIAGNOSIS — F329 Major depressive disorder, single episode, unspecified: Secondary | ICD-10-CM | POA: Diagnosis not present

## 2023-06-07 MED ORDER — SERTRALINE HCL 50 MG PO TABS: 50.0000 mg | ORAL_TABLET | Freq: Every day | ORAL | 0 refills | Status: DC

## 2023-06-07 MED ORDER — TRAZODONE HCL 50 MG PO TABS: 50.0000 mg | ORAL_TABLET | Freq: Every evening | ORAL | 0 refills | Status: DC | PRN

## 2023-06-07 NOTE — ED Notes (Signed)
Patient sleeping comfortable. No complaints at the moment. Will continue to monitor for safety.

## 2023-06-07 NOTE — ED Notes (Signed)
Patient is awake and alert on unit.  He ate breakfast and is now attending an AA meeting.  Will monitor and provide support as needed.

## 2023-06-07 NOTE — ED Notes (Signed)
Pt is currently sleeping, no distress noted, environmental check complete, will continue to monitor patient for safety.  

## 2023-06-07 NOTE — BH IP Treatment Plan (Signed)
Interdisciplinary Treatment and Diagnostic Plan Update  06/07/2023 Time of Session: 9:15AM Richard Moon MRN: [id number]  Diagnosis:  Final diagnoses:  Cocaine abuse (HCC)  Cocaine abuse with cocaine-induced mood disorder (HCC)  Substance induced mood disorder (HCC)     Current Medications:  Current Facility-Administered Medications  Medication Dose Route Frequency Provider Last Rate Last Admin   acetaminophen (TYLENOL) tablet 650 mg  650 mg Oral Q6H PRN Ajibola, Ene A, NP       alum & mag hydroxide-simeth (MAALOX/MYLANTA) 200-200-20 MG/5ML suspension 30 mL  30 mL Oral Q4H PRN Ajibola, Ene A, NP       hydrOXYzine (ATARAX) tablet 25 mg  25 mg Oral TID PRN Ajibola, Ene A, NP       magnesium hydroxide (MILK OF MAGNESIA) suspension 30 mL  30 mL Oral Daily PRN Ajibola, Ene A, NP   30 mL at 06/05/23 1013   sertraline (ZOLOFT) tablet 50 mg  50 mg Oral Daily Park Pope, MD   50 mg at 06/06/23 0944   traZODone (DESYREL) tablet 50 mg  50 mg Oral QHS PRN Ajibola, Ene A, NP   50 mg at 06/06/23 2114   Current Outpatient Medications  Medication Sig Dispense Refill   hydrOXYzine (ATARAX) 25 MG tablet Take 1 tablet (25 mg total) by mouth 3 (three) times daily as needed for anxiety. 30 tablet 0   PTA Medications: Prior to Admission medications   Medication Sig Start Date End Date Taking? Authorizing Provider  hydrOXYzine (ATARAX) 25 MG tablet Take 1 tablet (25 mg total) by mouth 3 (three) times daily as needed for anxiety. 02/15/23  Yes Massengill, Harrold Donath, MD    Patient Stressors: Substance abuse   Other: conflicts in home he was living in; Patient reports no desire to return back.     Patient Strengths: Average or above average intelligence  Capable of independent living  General fund of knowledge  Motivation for treatment/growth   Treatment Modalities: Medication Management, Group therapy, Case management,  1 to 1 session with clinician, Psychoeducation, Recreational  therapy.   Physician Treatment Plan for Primary and Secondary Diagnosis:  Final diagnoses:  Cocaine abuse (HCC)  Cocaine abuse with cocaine-induced mood disorder (HCC)  Substance induced mood disorder (HCC)   Long Term Goal(s): Improvement in symptoms so as ready for discharge  Short Term Goals: Patient will verbalize feelings in meetings with treatment team members. Patient will attend at least of 50% of the groups daily. Pt will complete the PHQ9 on admission, day 3 and discharge. Patient will participate in completing the [place] Suicide Severity Rating Scale Patient will score a low risk of violence for 24 hours prior to discharge Patient will take medications as prescribed daily.  Medication Management: Evaluate patient's response, side effects, and tolerance of medication regimen.  Therapeutic Interventions: 1 to 1 sessions, Unit Group sessions and Medication administration.  Evaluation of Outcomes: Progressing  LCSW Treatment Plan for Primary Diagnosis:  Final diagnoses:  Cocaine abuse (HCC)  Cocaine abuse with cocaine-induced mood disorder (HCC)  Substance induced mood disorder (HCC)    Long Term Goal(s): Safe transition to appropriate next level of care at discharge.  Short Term Goals: Facilitate acceptance of mental health diagnosis and concerns through verbal commitment to aftercare plan and appointments at discharge., Patient will identify one social support prior to discharge to aid in patient's recovery., Patient will attend AA/NA groups as scheduled., Identify minimum of 2 triggers associated with mental health/substance abuse issues with treatment team members., and Increase  skills for wellness and recovery by attending 50% of scheduled groups.  Therapeutic Interventions: Assess for all discharge needs, 1 to 1 time with Child psychotherapist, Explore available resources and support systems, Assess for adequacy in community support network, Educate family and significant  other(s) on suicide prevention, Complete Psychosocial Assessment, Interpersonal group therapy.  Evaluation of Outcomes: Progressing   Progress in Treatment: Attending groups: Yes. Participating in groups: Yes. Taking medication as prescribed: Yes. Toleration medication: Yes. Family/Significant other contact made: No, will contact:  Patient reports he does not have any support and declined collateral at this time.  Patient understands diagnosis: Yes. Discussing patient identified problems/goals with staff: Yes. Medical problems stabilized or resolved: Yes. Denies suicidal/homicidal ideation: Yes. Issues/concerns per patient self-inventory: Yes. Other: substance use and need for further treatment  New problem(s) identified: No, Describe:  other than reported on admission.   New Short Term/Long Term Goal(s): Safe transition to appropriate next level of care at discharge, Engage patient in therapeutic group addressing interpersonal concerns. Engage patient in aftercare planning with referrals and resources, Increase ability to appropriately verbalize feelings, Facilitate acceptance of mental health diagnosis and concerns and Identify triggers associated with mental health/substance abuse issues.   Patient Goals:  Patient reports a 30 year history of cocaine use, and reports he has sought treatment in the past at [facility]. Patient reports his last admission at [facility] was about 6 months ago. Patient reports he would like to be reconsidered for their program or any other program that would accept him.  Discharge Plan or Barriers: LCSW will send referrals out for review and will follow up to provide updates as received.  Reason for Continuation of Hospitalization: Medication stabilization Withdrawal symptoms  Estimated Length of Stay: 3-5 days  Last 3 [place] Suicide Severity Risk Score: Flowsheet Row ED from 06/04/2023 in [facility] Most recent reading at  06/05/2023  3:07 AM ED from 06/04/2023 in [facility] Emergency Department at [facility] Most recent reading at 06/04/2023 10:45 AM ED from 06/03/2023 in [facility] Emergency Department at [facility] Most recent reading at 06/03/2023 11:50 PM  C-SSRS RISK CATEGORY No Risk Error: Q2 is Yes, you must answer 3, 4, and 5 No Risk       Last PHQ 2/9 Scores:    06/05/2023    4:55 AM 03/31/2021    4:41 PM 05/25/2020    2:08 PM  Depression screen PHQ 2/9  Decreased Interest 2 2 3   Down, Depressed, Hopeless 2 2 3   PHQ - 2 Score 4 4 6   Altered sleeping 1 2 3   Tired, decreased energy 1 1 3   Change in appetite 1 1 3   Feeling bad or failure about yourself  3 2 3   Trouble concentrating 1 1 1   Moving slowly or fidgety/restless 0 1 3  Suicidal thoughts 1 2 3   PHQ-9 Score 12 14 25   Difficult doing work/chores Very difficult Somewhat difficult Very difficult    Scribe for Treatment Team: Loleta Dicker, LCSW 06/07/2023 9:27 AM

## 2023-06-07 NOTE — Tx Team (Signed)
LCSW met with patient at bedside to assess current mood, affect, physical state, and inquire about needs/goals while here in [facility] and after discharge. Patient reports he presented due to needing to detox from cocaine use and seek further treatment for himself. Patient reports he has been living in an apartment with one other guy who he reports uses substances as well. Patient reports he does not plan to return back there as he is tired of substance use and wants to get clean. Patient reports a 30 year history of cocaine use, and reports he has sought treatment in the past at [facility]. Patient reports his last admission at [facility] was about 6 months ago. Patient reports he would like to be reconsidered for their program or any other program that would accept him. Patient denies having access to transportation, and reports having no support. Patient denies having any legal charges or upcoming court dates. Patient currently denies any SI/HI/AVH and reports mood as "alright". Patient aware that LCSW will send referrals out for review and will follow up to provide updates as received. Patient expressed understanding and appreciation of LCSW assistance. No other needs were reported at this time by patient.   Referral has been sent to [facility] and [facility] for review. LCSW will continue to follow and provide support to patient while on [facility] unit.   Fernande Boyden, LCSW Clinical Social Worker [facility] Ph: [phone]

## 2023-06-07 NOTE — Discharge Instructions (Addendum)
Patient will be discharging to [facility] on Tuesday 06/08/2023 by 9:00am with transportation provided via Taxi. Address is [address] [place], [place] [address].    [facility] [address], [place], [address] [phone] phone  New Patient Assessment/Therapy Walk-Ins:  Monday and Wednesday: 8 am until slots are full. Every 1st and 2nd Fridays of the month: 1 pm - 5 pm.  NO ASSESSMENT/THERAPY WALK-INS ON TUESDAYS OR THURSDAYS  New Patient Assessment/Medication Management Walk-Ins:  Monday - Friday:  8 am - 11 am.  For all walk-ins, we ask that you arrive by 7:30 am because patients will be seen in the order of arrival.  Availability is limited; therefore, you may not be seen on the same day that you walk-in.  Our goal is to serve and meet the needs of our community to the best of our ability.  SUBSTANCE USE TREATMENT for Medicaid and State Funded/IPRS  [facility] [address], [place], [address] [phone] phone NOTE: [facility] is no longer offering IOP services.  Serves those who are low-income or have no insurance.  [facility] [address] [place], [place], [address] [phone] phone [phone] fax NOTE: Does have Substance Abuse-Intensive Outpatient Program [facility]) as well as transitional housing if eligible.  [facility] [address] [place], [place], [address] [phone] phone [phone] fax  [facility] [phone] [place], [place], [address] [phone] phone [phone] fax  HALFWAY HOUSES:  [facility] [phone]  [employer].oxfordvacancies.com  12 STEP PROGRAMS:  Alcoholics Anonymous of [place] [employer]  Narcotics Anonymous of [place] [employer]  Al-Anon of [place], [place] www.[place]alanon.org/find-meetings.html  Nar-Anon  https://nar-anon.org/find-a-meetin  List of Residential placements:   [facility] in [place]: [phone]  [facility]: [phone]  [place], [place] [phone]: Male and male facility; 30-day program: (uninsured and Medicaid such as Laurena Bering, [place], [place], partners)  [facility]: [phone]; men and women's facility; 28 days; Can have Medicaid tailored plan Tour manager or Partners)  [facility]: [phone] Karoline Caldwell or Larita Fife; 28 day program; must be fully detox; tailored Medicaid or no insurance  [address] in [place], [place]; [phone]; 28 day all males program; no insurance accepted  [facility] Referral in [place]: Gabriel Rung [phone] (no insurance or Medicaid only); 90 days; outpatient services but provide housing in apartments [place]  [facility] Admission: [phone]: Patient must complete phone screening for placement: [place], [place]; 6 month program; uninsured, Medicaid, and [employer].   [facility]: no insurance required; [phone]  [facility]: [phone]; Intake: Molly Maduro; Must fill out application online; Alecia Lemming Delay [phone] x [address] [facility] in [place], [place]: [phone]; Admissions Coordinators Mr. Maurine Minister or Barron Alvine; 90 day program.  [facility]: [place], [place] [phone]; Co-Ed 9 month to a year program; Online application; Men entry fee is $500 (6-89months);  [employer]: [address] [place], [place] [address]; no fee or insurance required; minimum of 2 years; Highly structured; work based; Intake Coordinator is Thayer Ohm [phone]  [facility] in [place], [place]: [phone]; Fax number is [phone]; website: www.[facility].org; Requires 3-6 page autobiography; 2 year program (18 months and then 65month transitional housing); Admission fee is $300; no insurance needed; work  Automotive engineer in [place], [place]: [employer] Desk Staff: Danise Edge [phone]: They have a Men's Regenerations Program 6-63months. Free program; There is an initial $300 fee however, they  are willing to work with patients regarding that. Application is online.  [facility]: Admissions [phone] Doran Heater ext [phone]; Any 7-90 day program is out of pocket; 12 month program is free of charge; there is a $275 entry fee; Patient is responsible for own transportation

## 2023-06-07 NOTE — ED Provider Notes (Signed)
[facility]BC/OBS ASAP Discharge Summary  Date and Time: 06/07/2023 3:20 PM  Name: Richard Moon  MRN:  [id number]   Discharge Diagnoses:  Final diagnoses:  Cocaine abuse (HCC)  Cocaine abuse with cocaine-induced mood disorder (HCC)  Substance induced mood disorder (HCC)    Subjective: Richard Moon is a 62 year old male with psychiatric history significant for depression, cocaine abuse, substance-induced mood disorder presents to [facility] for cocaine use and residential rehab.   Patient seen in in his room, no acute distress. Patient reports feeling "a little down" today. Patient reports fair sleep and good appetite. Regarding withdrawal symptoms, he denies and states he just feels tired. Regarding cravings, he denies. Patient denies current SI, HI, and AVH. Regarding discharge plans, he prefers to go to residential rehab.  He was living with a friend in an apartment and states that that friend was not supportive which caused him to want to move out.   He reports using cocaine for over 30 years.  He reports being to a residential facility for rehab less than a year ago and states that it was a fair experience, reporting having a periods of sobriety for 6 months.  Stay Summary: The patient was evaluated each day by a clinical provider to ascertain response to treatment. Improvement was noted by the patient's report of decreasing symptoms, improved sleep and appetite, affect, medication tolerance, behavior, and participation in unit programming.  Patient was asked each day to complete a self inventory noting mood, mental status, pain, new symptoms, anxiety and concerns.  The patient's medications were managed with the following directions: Cocaine Use Disorder Major Depressive Disorder -Continue sertraline 50 mg daily -Hydroxyzine 25 mg tid for anxiety -Trazodone 50 mg at bedtime prn for insomnia -PRNs: tylenol for pain, MoM for constipation, maalox for indigestion  Patient responded well to  medication and being in a therapeutic and supportive environment. Positive and appropriate behavior was noted and the patient was motivated for recovery. The patient worked closely with the treatment team and case manager to develop a discharge plan with appropriate goals. Coping skills, problem solving as well as relaxation therapies were also part of the unit programming.   Total Time spent with patient: 30 minutes  Past Psychiatric History: Previous Psych Diagnoses: MDD, stimulant use disorder Prior inpatient treatment: Multiple psychiatric inpatient treatment Current/prior outpatient treatment: Denies Prior rehab hx: Yes multiple times Psychotherapy hx: Yes History of suicide: Yes, multiple times.  Recently attempted to overdose on crack cocaine. History of homicide or aggression: Denies Psychiatric medication history: Yes,, trial of medication on Zoloft and hydroxyzine Psychiatric medication compliance history: Noncompliance Neuromodulation history: Denies Current Psychiatrist: Denies Current therapist: Denies   Past Medical History: Hernia Family History: none reported  Social History:  Tobacco Use   Smoking status: Some Days      Current packs/day: 0.25      Average packs/day: 0.3 packs/day for 20.0 years (5.0 ttl pk-yrs)      Types: Cigarettes   Smokeless tobacco: Never  Vaping Use   Vaping status: Every Day  Substance Use Topics   Alcohol use: Yes      Comment: Occasional alcohol use   Drug use: Yes      Types: Marijuana, Cocaine, Heroin   Tobacco Cessation:  N/A, patient does not currently use tobacco products  Current Medications:  Current Facility-Administered Medications  Medication Dose Route Frequency Provider Last Rate Last Admin   acetaminophen (TYLENOL) tablet 650 mg  650 mg Oral Q6H PRN Ajibola, Ene A, NP  alum & mag hydroxide-simeth (MAALOX/MYLANTA) 200-200-20 MG/5ML suspension 30 mL  30 mL Oral Q4H PRN Ajibola, Ene A, NP       hydrOXYzine (ATARAX)  tablet 25 mg  25 mg Oral TID PRN Ajibola, Ene A, NP       magnesium hydroxide (MILK OF MAGNESIA) suspension 30 mL  30 mL Oral Daily PRN Ajibola, Ene A, NP   30 mL at 06/05/23 1013   sertraline (ZOLOFT) tablet 50 mg  50 mg Oral Daily Park Pope, MD   50 mg at 06/07/23 0953   traZODone (DESYREL) tablet 50 mg  50 mg Oral QHS PRN Ajibola, Ene A, NP   50 mg at 06/06/23 2114   Current Outpatient Medications  Medication Sig Dispense Refill   [START ON 06/08/2023] sertraline (ZOLOFT) 50 MG tablet Take 1 tablet (50 mg total) by mouth daily. 30 tablet 0   traZODone (DESYREL) 50 MG tablet Take 1 tablet (50 mg total) by mouth at bedtime as needed for sleep. 30 tablet 0    PTA Medications:  Facility Ordered Medications  Medication   [COMPLETED] hydrOXYzine (ATARAX) tablet 25 mg   acetaminophen (TYLENOL) tablet 650 mg   alum & mag hydroxide-simeth (MAALOX/MYLANTA) 200-200-20 MG/5ML suspension 30 mL   magnesium hydroxide (MILK OF MAGNESIA) suspension 30 mL   traZODone (DESYREL) tablet 50 mg   hydrOXYzine (ATARAX) tablet 25 mg   sertraline (ZOLOFT) tablet 50 mg   PTA Medications  Medication Sig   [START ON 06/08/2023] sertraline (ZOLOFT) 50 MG tablet Take 1 tablet (50 mg total) by mouth daily.   traZODone (DESYREL) 50 MG tablet Take 1 tablet (50 mg total) by mouth at bedtime as needed for sleep.       06/07/2023    2:09 PM 06/05/2023    4:55 AM 03/31/2021    4:41 PM  Depression screen PHQ 2/9  Decreased Interest 2 2 2   Down, Depressed, Hopeless 2 2 2   PHQ - 2 Score 4 4 4   Altered sleeping 1 1 2   Tired, decreased energy 1 1 1   Change in appetite 1 1 1   Feeling bad or failure about yourself  3 3 2   Trouble concentrating 1 1 1   Moving slowly or fidgety/restless 0 0 1  Suicidal thoughts 1 1 2   PHQ-9 Score 12 12 14   Difficult doing work/chores  Very difficult Somewhat difficult    Flowsheet Row ED [facility] 06[facility]04/2023 in [facility] Most recent reading at  06/05/2023  3:07 AM ED from 06/04/2023 in [facility] Emergency Department at [facility] Most recent reading at 06/04/2023 10:45 AM ED from 06/03/2023 in [facility] Emergency Department at [facility] Most recent reading at 06/03/2023 11:50 PM  C-SSRS RISK CATEGORY No Risk Error: Q2 is Yes, you must answer 3, 4, and 5 No Risk       Musculoskeletal  Strength & Muscle Tone: within normal limits Gait & Station: normal Patient leans: N/A  Psychiatric Specialty Exam  Presentation  General Appearance:  Appropriate for Environment  Eye Contact: Good  Speech: Clear and Coherent  Speech Volume: Normal  Handedness: Right   Mood and Affect  Mood: Depressed  Affect: Congruent   Thought Process  Thought Processes: Coherent  Descriptions of Associations:Intact  Orientation:Full (Time, Place and Person)  Thought Content:Logical; WDL  Diagnosis of Schizophrenia or Schizoaffective disorder in past: No  Duration of Psychotic Symptoms: No data recorded  Hallucinations:Hallucinations: None  Ideas of Reference:None  Suicidal Thoughts:Suicidal Thoughts:  No  Homicidal Thoughts:Homicidal Thoughts: No   Sensorium  Memory: Remote Good  Judgment: Fair  Insight: Shallow   Executive Functions  Concentration: Good  Attention Span: Good  Recall: Good  Fund of Knowledge: Good  Language: Good   Psychomotor Activity  Psychomotor Activity: Psychomotor Activity: Normal   Assets  Assets: Communication Skills; Desire for Improvement; Resilience   Sleep  Sleep: Sleep: Fair     Physical Exam  Physical Exam Vitals reviewed.  Constitutional:      Appearance: Normal appearance.  HENT:     Head: Normocephalic and atraumatic.  Cardiovascular:     Rate and Rhythm: Normal rate.  Pulmonary:     Effort: Pulmonary effort is normal.  Neurological:     General: No focal deficit present.     Mental Status: He is alert and oriented to  person, place, and time.    Review of Systems  Constitutional:  Negative for chills and fever.  Respiratory:  Negative for shortness of breath.   Cardiovascular:  Negative for chest pain and palpitations.  Gastrointestinal:  Negative for nausea and vomiting.  Neurological:  Negative for headaches.   Blood pressure 119/75, pulse 75, temperature 98.2 F (36.8 C), temperature source Oral, resp. rate 18, SpO2 99%. There is no height or weight on file to calculate BMI.  Demographic Factors:  Male and Low socioeconomic status  Loss Factors: Financial problems/change in socioeconomic status  Historical Factors: Impulsivity  Risk Reduction Factors:   Positive coping skills or problem solving skills  Continued Clinical Symptoms:  Alcohol/Substance Abuse/Dependencies  Cognitive Features That Contribute To Risk:  Closed-mindedness    Suicide Risk:  Mild:  Suicidal ideation of limited frequency, intensity, duration, and specificity.  There are no identifiable plans, no associated intent, mild dysphoria and related symptoms, good self-control (both objective and subjective assessment), few other risk factors, and identifiable protective factors, including available and accessible social support.  Plan Of Care/Follow-up recommendations:  Activity as tolerated Regular diet Continue prescription medications See PCP for medical conditions   Disposition: [facility] on 12/16  Lance Muss, MD 06/07/2023, 3:20 PM

## 2023-06-07 NOTE — Group Note (Signed)
Group Topic: Balance in Life  Group Date: 06/07/2023 Start Time: 1400 End Time: 1401 Facilitators: Loleta Dicker, LCSW  Department: [facility]  Number of Participants: 0  Group Focus: activities of daily living skills, check in, clarity of thought, daily focus, personal responsibility, and problem solving Treatment Modality:  Cognitive Behavioral Therapy Interventions utilized were assignment Purpose: explore maladaptive thinking, express feelings, improve communication skills, increase insight, regain self-worth, reinforce self-care, relapse prevention strategies, and trigger / craving management  Name: Richard Moon Date of Birth: [birth year]  MR: [id number]    Level of Participation: Patient declined to attend group on today. Patient decided to remain lying in bed. Patient has been encouraged to participate in all programming on unit.   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

## 2023-06-07 NOTE — Discharge Planning (Signed)
Referral was received and per Marcelino Duster at [facility], patient has been accepted and can transfer to the facility on tomorrow by 9:00am. Update has been provided to the patient and MD made aware. Patient will need a 14-30 day supply of medication and one month refill. No nicotine gum allowed, however 14-30 day nicotine patches to be provided if needed. No other needs to report at this time.    LCSW will continue to follow up and provide updates as received.    Fernande Boyden, LCSW Clinical Social Worker [facility] Ph: [phone]

## 2023-06-07 NOTE — ED Notes (Signed)
Patient remains asleep in bed at this time.  Patient is without distress or complaint.  No withdrawal.  Will monitor.

## 2023-06-07 NOTE — Group Note (Signed)
Group Topic: Change and Accountability  Group Date: 06/07/2023 Start Time: 1003 End Time: 1105 Facilitators: Bary Leriche, NT  Department: [facility]  Number of Participants: 2  Group Focus: substance abuse education Treatment Modality:  Psychoeducation Interventions utilized were patient education Purpose: trigger / craving management  Name: Richard Moon Date of Birth: 1960/10/23  MR: [id number]    Level of Participation: active Quality of Participation: attentive and cooperative Interactions with others: gave feedback Mood/Affect: positive Triggers (if applicable): n/a Cognition: coherent/clear Progress: Minimal Response: At this time, Chasyn is unsure of what his next steps will be.  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

## 2023-06-07 NOTE — ED Notes (Signed)
Patient in the bedroom calm and sleeping. NAD. Will monitor for safety.

## 2023-06-07 NOTE — ED Provider Notes (Signed)
[facility] Progress Note  Date and Time: 06/07/2023 8:47 AM Name: Richard Moon MRN:  [id number]  Reason For Admission: Cocaine use disorder and seeking residential rehab  Subjective:   Richard Moon is a 62 year old male with psychiatric history significant for depression, cocaine abuse, substance-induced mood disorder presents to [facility] for cocaine use and residential rehab.  Patient seen in in his room, no acute distress. Patient reports feeling "a little down" today. Patient reports fair sleep and good appetite. Regarding withdrawal symptoms, he denies and states he just feels tired. Regarding cravings, he denies. Patient denies current SI, HI, and AVH. Regarding discharge plans, he prefers to go to residential rehab.  He was living with a friend in an apartment and states that that friend was not supportive which caused him to want to move out.  He reports using cocaine for over 30 years.  He reports being to a residential facility for rehab less than a year ago and states that it was a fair experience, reporting having a periods of sobriety for 6 months.   Diagnosis:  Final diagnoses:  Cocaine abuse (HCC)  Cocaine abuse with cocaine-induced mood disorder (HCC)  Substance induced mood disorder (HCC)    Total Time spent with patient: 30 minutes  Past Psychiatric Hx: Previous Psych Diagnoses: MDD, stimulant use disorder Prior inpatient treatment: Multiple psychiatric inpatient treatment Current/prior outpatient treatment: Denies Prior rehab hx: Yes multiple times Psychotherapy hx: Yes History of suicide: Yes, multiple times.  Recently attempted to overdose on crack cocaine. History of homicide or aggression: Denies Psychiatric medication history: Yes,, trial of medication on Zoloft and hydroxyzine Psychiatric medication compliance history: Noncompliance Neuromodulation history: Denies Current Psychiatrist: Denies Current therapist: Denies  Family History:  No family history on  file.  Labs  Lab Results:     Latest Ref Rng & Units 06/04/2023   12:16 PM 02/06/2023    9:10 PM 05/28/2022    5:56 AM  CBC  WBC 4.0 - 10.5 K/uL 6.7  5.3  4.6   Hemoglobin 13.0 - 17.0 g/dL 43.3  29.5  18.8   Hematocrit 39.0 - 52.0 % 43.4  42.6  41.0   Platelets 150 - 400 K/uL 239  217  176       Latest Ref Rng & Units 06/04/2023   12:16 PM 02/15/2023    6:52 PM 02/06/2023    9:10 PM  CMP  Glucose 70 - 99 mg/dL 92  416  82   BUN 6 - 20 mg/dL 14  29  14    Creatinine 0.61 - 1.24 mg/dL 6.06  3.01  6.01   Sodium 135 - 145 mmol/L 138  138  139   Potassium 3.5 - 5.1 mmol/L 4.0  4.2  3.9   Chloride 98 - 111 mmol/L 111  101  104   CO2 22 - 32 mmol/L 23  28  23    Calcium 8.9 - 10.3 mg/dL 8.5  9.6  9.2   Total Protein 6.5 - 8.1 g/dL 5.0  7.2  6.6   Total Bilirubin <1.2 mg/dL 0.8  0.4  0.7   Alkaline Phos 38 - 126 U/L 37  65  62   AST 15 - 41 U/L 23  42  54   ALT 0 - 44 U/L 18  49  28     Physical Findings   AIMS    Flowsheet Row [facility]) [facility]) [facility]  AIMS Total Score 0 0 0      AUDIT    Flowsheet Row [facility]) [facility] Ad[facility] ([facility]) [facility]  Alcohol Use Disorder Identification Test Final Score (AUDIT) 4 3      PHQ2-9    Flowsheet Row ED from 06/04/2023 in [facility] ED from 03/30/2021 in [facility] Department at [facility] ED from 05/25/2020 in [facility] Department at [facility]  PHQ-2 Total Score 4 4 6   PHQ-9 Total Score 12 14 25       Flowsheet Row ED from 06/04/2023 in [facility] Most recent reading at  06/05/2023  3:07 AM ED from 06/04/2023 in [facility] Emergency Department at [facility] Most recent reading at 06/04/2023 10:45 AM ED from 06/03/2023 in [facility] Emergency Department at [facility] Most recent reading at 06/03/2023 11:50 PM  C-SSRS RISK CATEGORY No Risk Error: Q2 is Yes, you must answer 3, 4, and 5 No Risk        Musculoskeletal  Strength & Muscle Tone: within normal limits Gait & Station: normal Patient leans: N/A  Psychiatric Specialty Exam  Presentation  General Appearance:  Appropriate for Environment   Eye Contact: Good   Speech: Clear and Coherent   Speech Volume: Normal   Handedness: Right    Mood and Affect  Mood: Depressed   Affect: Congruent    Thought Process  Thought Processes: Coherent   Descriptions of Associations:Intact   Orientation:Full (Time, Place and Person)   Thought Content:Logical; WDL   Diagnosis of Schizophrenia or Schizoaffective disorder in past: No     Hallucinations:Hallucinations: None    Ideas of Reference:None   Suicidal Thoughts:Suicidal Thoughts: No    Homicidal Thoughts:Homicidal Thoughts: No     Sensorium  Memory: Remote Good   Judgment: Fair   Insight: Shallow    Executive Functions  Concentration: Good   Attention Span: Good   Recall: Good   Fund of Knowledge: Good   Language: Good    Psychomotor Activity  Psychomotor Activity: Psychomotor Activity: Normal     Assets  Assets: Communication Skills; Desire for Improvement; Resilience    Sleep  Sleep: Sleep: Fair     Physical Exam  Physical Exam Vitals reviewed.  Constitutional:      Appearance: Normal appearance.  HENT:     Head: Normocephalic and atraumatic.  Cardiovascular:     Rate and Rhythm: Normal rate.  Pulmonary:     Effort: Pulmonary effort is normal.  Neurological:     Mental Status: He is alert and oriented to person, place, and time.      Review of Systems  Constitutional:  Negative for chills and fever.  Cardiovascular:  Negative for chest pain and palpitations.  Gastrointestinal:  Negative for nausea and vomiting.  Neurological:  Negative for tremors and headaches.   Blood pressure 119/75, pulse 75, temperature 98.2 F (36.8 C), temperature source Oral, resp. rate 18, SpO2 99%. There is no height or weight on file to calculate BMI.  ASSESSMENT Richard Moon is a 62 year old AA male with prior psychiatric hx significant for major depressive disorder & stimulant (cocaine) use disorder [facility] seeking residential rehab and management of stimulant use disorder-cocaine. Tolerating sertraline well and only required  PRN MoM for constipation and trazodone for sleep.  PLAN Cocaine Use Disorder Major Depressive Disorder -Continue sertraline 50 mg daily -Hydroxyzine 25 mg tid for anxiety -Trazodone 50 mg at bedtime prn for insomnia -PRNs: tylenol for pain, MoM for constipation, maalox for indigestion  Dispo: seeking residential rehab  Lance Muss, MD 06/07/2023 8:47 AM

## 2023-06-07 NOTE — ED Notes (Signed)
Patient has taken night time medications and has gone back to his room, no complaint of distress other than indigestion in which patient was given medication. Will continue to monitor patient for safety.

## 2023-06-08 DIAGNOSIS — F329 Major depressive disorder, single episode, unspecified: Secondary | ICD-10-CM | POA: Diagnosis not present

## 2023-06-08 DIAGNOSIS — F17210 Nicotine dependence, cigarettes, uncomplicated: Secondary | ICD-10-CM | POA: Diagnosis not present

## 2023-06-08 DIAGNOSIS — Z79899 Other long term (current) drug therapy: Secondary | ICD-10-CM | POA: Diagnosis not present

## 2023-06-08 DIAGNOSIS — F1414 Cocaine abuse with cocaine-induced mood disorder: Secondary | ICD-10-CM | POA: Diagnosis not present

## 2023-06-08 MED ORDER — SERTRALINE HCL 50 MG PO TABS: 50.0000 mg | ORAL_TABLET | Freq: Every day | ORAL | 0 refills | Status: DC

## 2023-06-08 MED ORDER — TRAZODONE HCL 50 MG PO TABS: 50.0000 mg | ORAL_TABLET | Freq: Every evening | ORAL | 0 refills | Status: DC | PRN

## 2023-06-08 NOTE — ED Notes (Signed)
Patient A&Ox4. Denies intent to harm self/others when asked. Denies A/VH. Patient denies any physical complaints when asked. No acute distress noted. Pt voiced excitement about being transferred to [facility] this am. Support and encouragement provided. Routine safety checks conducted according to facility protocol. Encouraged patient to notify staff if thoughts of harm toward self or others arise. Patient verbalize understanding and agreement. Will continue to monitor for safety.

## 2023-06-08 NOTE — ED Notes (Signed)
Patient A&O x 4, ambulatory. Patient discharged in no acute distress. Patient denied SI/HI, A/VH upon discharge. Patient verbalized understanding of all discharge instructions explained by staff, to include follow up recommendations, RX's and safety plan. Pt belongings returned to patient from locker #27 intact. Patient escorted to lobby via staff for transport to [facility] via [employer]. Safety maintained.

## 2023-06-08 NOTE — Group Note (Signed)
Group Topic: Change and Accountability  Group Date: 06/07/2023 Start Time: 1930 End Time: 2000 Facilitators: Debe Coder, NT  Department: [facility]  Number of Participants: 1  Group Focus: communication Treatment Modality:  Individual Therapy Interventions utilized were problem solving Purpose: regain self-worth  Name: Richard Moon Date of Birth: 08-01-1960  MR: [id number]    Level of Participation: pt did not attend group Quality of Participation:  pt did not attend group Interactions with others:  pt did not attend group Mood/Affect:  pt did not attend group Triggers (if applicable):  pt did not attend group Cognition:  pt did not attend group Progress:  pt did not attend group Response:  pt did not attend group Plan:  pt did not attend group  Patients Problems:  Patient Active Problem List   Diagnosis Date Noted   Substance abuse (HCC) 06/05/2023   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

## 2023-06-08 NOTE — ED Notes (Signed)
Pt is currently sleeping, no distress noted, environmental check complete, will continue to monitor patient for safety.  

## 2023-06-21 ENCOUNTER — Emergency Department (HOSPITAL_BASED_OUTPATIENT_CLINIC_OR_DEPARTMENT_OTHER)
Admission: EM | Admit: 2023-06-21 | Discharge: 2023-06-21 | Disposition: A | Discharge status: Home / Self Care | Payer: MEDICAID | Attending: Emergency Medicine | Admitting: Emergency Medicine

## 2023-06-21 ENCOUNTER — Other Ambulatory Visit: Payer: Self-pay

## 2023-06-21 ENCOUNTER — Emergency Department (HOSPITAL_BASED_OUTPATIENT_CLINIC_OR_DEPARTMENT_OTHER): Payer: MEDICAID

## 2023-06-21 ENCOUNTER — Encounter (HOSPITAL_BASED_OUTPATIENT_CLINIC_OR_DEPARTMENT_OTHER): Payer: Self-pay | Admitting: Emergency Medicine

## 2023-06-21 DIAGNOSIS — H9222 Otorrhagia, left ear: Secondary | ICD-10-CM | POA: Diagnosis not present

## 2023-06-21 DIAGNOSIS — R059 Cough, unspecified: Secondary | ICD-10-CM | POA: Insufficient documentation

## 2023-06-21 DIAGNOSIS — R058 Other specified cough: Secondary | ICD-10-CM

## 2023-06-21 NOTE — Discharge Instructions (Addendum)
Thank you for allowing [place] to be a part of your care today.  You were evaluated in the ED for bleeding in your ear and cough.  Your chest x-ray does not show evidence of pneumonia or other disease.  The dark specks you see may be old blood or anything you have previously inhaled, especially if you have recently smoked.   Your ear has a small abrasion (like a scratch) in the canal.  This should heal on its own.  You may see a small amount of blood coming from the ear while this is healing.  DO NOT insert anything into the ear as this may cause further injury.   Follow up with your PCP if you symptoms persist.  If you do not have a PCP, call the [facility] phone number to make an appointment with one.

## 2023-06-21 NOTE — ED Notes (Signed)
ED Provider at bedside. 

## 2023-06-21 NOTE — ED Triage Notes (Signed)
Pt reports having blood in his left ear, no active bleeding in triage, reports intermittent HA for the last 2 days

## 2023-06-21 NOTE — ED Provider Notes (Signed)
[facility] Provider Note   CSN: [id number] Arrival date & time: 06/21/23  1330     History  Chief Complaint  Patient presents with   Otalgia    Richard Moon is a 62 y.o. male with past medical history significant for MDD, cocaine dependence, polysubstance abuse, presents to the ED complaining of blood in his left ear and a productive cough with "black stuff".  Patient states he noticed blood coming out of his left ear, but it is not painful.  Denies inserting anything into his ear or injury.  He reports he has had a cough for the past 3 days and has mucus with dark specks.  He reports he has not smoked anything in 2 weeks.  Patient also had cold-like symptoms, but they have mostly resolved, aside from the cough.  Denies fever, chills.        Home Medications Prior to Admission medications   Medication Sig Start Date End Date Taking? Authorizing Provider  sertraline (ZOLOFT) 50 MG tablet Take 1 tablet (50 mg total) by mouth daily. 06/08/23   Lauro Franklin, MD  traZODone (DESYREL) 50 MG tablet Take 1 tablet (50 mg total) by mouth at bedtime as needed for sleep. 06/08/23   Lauro Franklin, MD      Allergies    Patient has no known allergies.    Review of Systems   Review of Systems  Constitutional:  Negative for chills and fever.  HENT:  Positive for ear discharge (blood) and ear pain. Negative for congestion, rhinorrhea and sore throat.   Respiratory:  Positive for cough.     Physical Exam Updated Vital Signs BP 105/69 (BP Location: Left Arm)   Pulse 63   Temp 98.2 F (36.8 C) (Oral)   Resp 14   Ht 5\' 10"  (1.778 m)   Wt 70.3 kg   SpO2 100%   BMI 22.24 kg/m  Physical Exam Vitals and nursing note reviewed.  Constitutional:      General: He is not in acute distress.    Appearance: Normal appearance. He is not ill-appearing or diaphoretic.  HENT:     Right Ear: Tympanic membrane and ear canal normal.      Left Ear: Tympanic membrane normal.  No middle ear effusion. No hemotympanum. Tympanic membrane is not erythematous or bulging.     Ears:     Comments: Small amount of blood in the bottom of the ear canal.  Some of the blood appears dried.  TM is intact.   Cardiovascular:     Rate and Rhythm: Normal rate and regular rhythm.  Pulmonary:     Effort: Pulmonary effort is normal. No tachypnea or respiratory distress.     Breath sounds: Normal breath sounds and air entry.  Skin:    General: Skin is warm and dry.     Capillary Refill: Capillary refill takes less than 2 seconds.  Neurological:     Mental Status: He is alert. Mental status is at baseline.  Psychiatric:        Mood and Affect: Mood normal.        Behavior: Behavior normal.     ED Results / Procedures / Treatments   Labs (all labs ordered are listed, but only abnormal results are displayed) Labs Reviewed - No data to display  EKG None  Radiology DG Chest 2 View Result Date: 06/21/2023 CLINICAL DATA:  Cough with dark sputum. EXAM: CHEST - 2 VIEW COMPARISON:  05/26/2022 FINDINGS: The cardiomediastinal contours are normal. Mild chronic interstitial coarsening. Pulmonary vasculature is normal. No consolidation, pleural effusion, or pneumothorax. No acute osseous abnormalities are seen. IMPRESSION: No active cardiopulmonary disease. Electronically Signed   By: Narda Rutherford M.D.   On: 06/21/2023 19:18    Procedures Procedures    Medications Ordered in ED Medications - No data to display  ED Course/ Medical Decision Making/ A&P                                 Medical Decision Making Amount and/or Complexity of Data Reviewed Radiology: ordered.   This patient presents to the ED with chief complaint(s) of cough, bleeding from ear with pertinent past medical history of cocaine abuse.  The complaint involves an extensive differential diagnosis and also carries with it a high risk of complications and morbidity.     The differential diagnosis includes injury to ear canal, TM rupture, pneumonia, malignancy, environmental/irritant inhalation   The initial plan is to obtain chest x-ray  Initial Assessment:   Exam significant for well-appearing patient who is not in acute distress.  Small amount of blood in the bottom of the ear canal.  Some of the blood appears dried.  TM is intact.  Lungs are clear to auscultation bilaterally.  He is speaking in full sentences without difficulty.    Independent visualization and interpretation of imaging: I independently visualized the following imaging with scope of interpretation limited to determining acute life threatening conditions related to emergency care: chest x-ray, which revealed no evidence of pneumonia or other infiltrate.  Treatment and Reassessment: Patient's ear was gently irrigated.  Appears there is an abrasion within the ear canal likely causing the bleeding.   Disposition:   Advised patient his bleeding ear should resolve on its own and to not insert anything into the ear canal as this may cause further damage.  Patient's cough may be viral as he did have recent URI symptoms.  No evidence of pneumonia.  Dark specks in sputum may be from smoking or old blood, and he does not have significant hemoptysis.    The patient has been appropriately medically screened and/or stabilized in the ED. I have low suspicion for any other emergent medical condition which would require further screening, evaluation or treatment in the ED or require inpatient management. At time of discharge the patient is hemodynamically stable and in no acute distress. I have discussed work-up results and diagnosis with patient and answered all questions. Patient is agreeable with discharge plan. We discussed strict return precautions for returning to the emergency department and they verbalized understanding.             Final Clinical Impression(s) / ED Diagnoses Final  diagnoses:  Bleeding from left ear  Productive cough    Rx / DC Orders ED Discharge Orders     None         Lenard Simmer, PA-C 06/21/23 1934    Charlynne Pander, MD 06/21/23 2133

## 2023-06-21 NOTE — ED Notes (Signed)
Patient transported to X-ray 

## 2023-09-24 IMAGING — DX DG CHEST 2V
3 series · 3 of 3 positions shown · non-contrast
Comparison: Chest radiograph 05/20/2020

CLINICAL DATA: Weakness, fatigue for 2 days whole-body soreness,
congestion

EXAM:
CHEST - 2 VIEW

[chest lat]
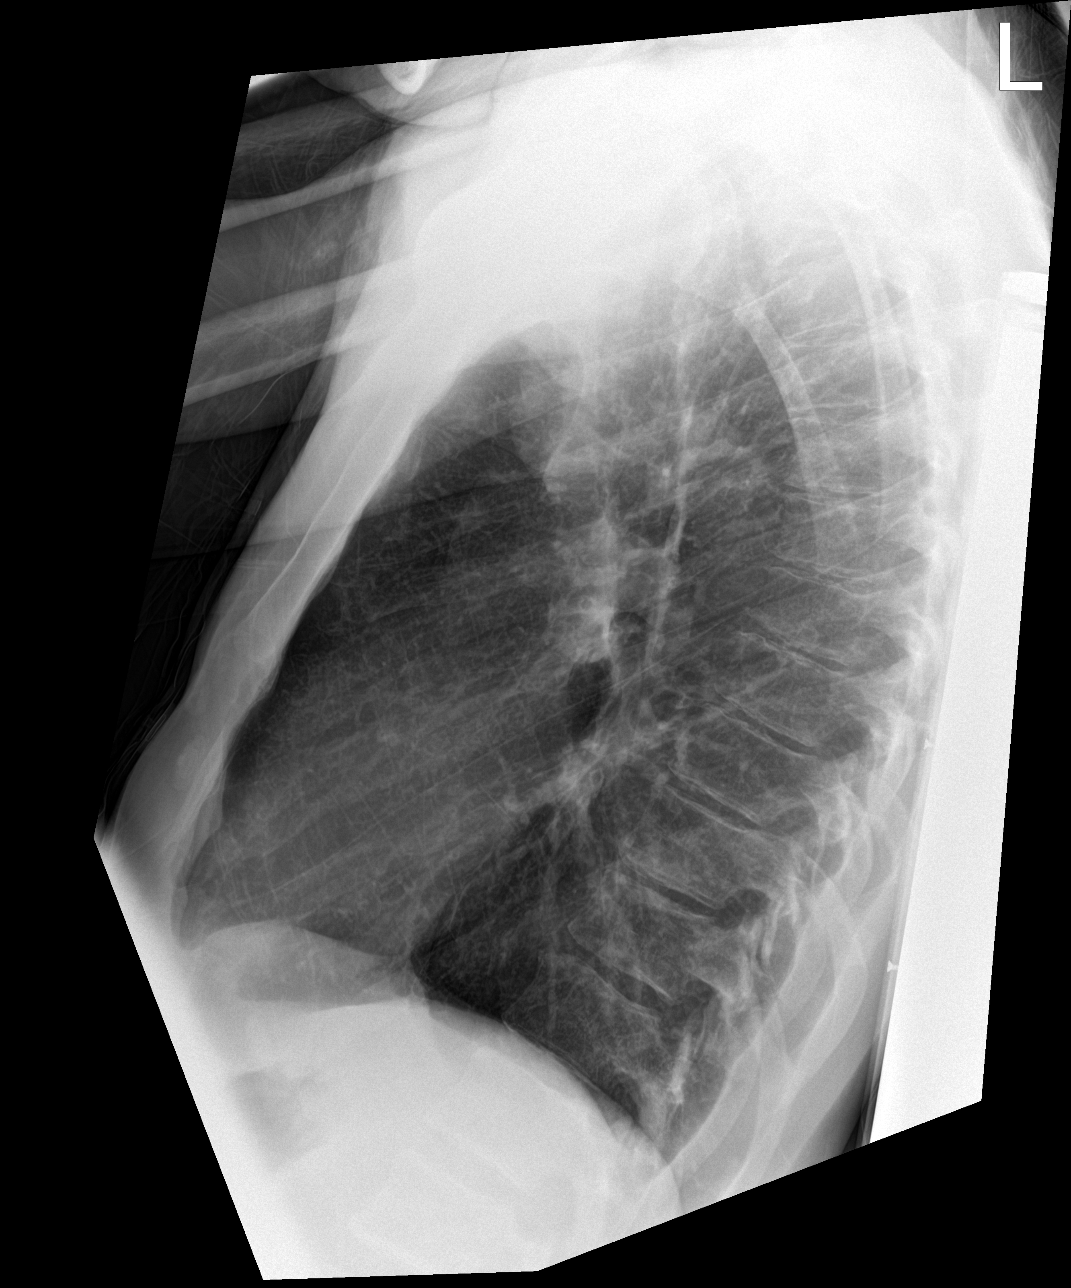

[chest ap (1 of 2)]
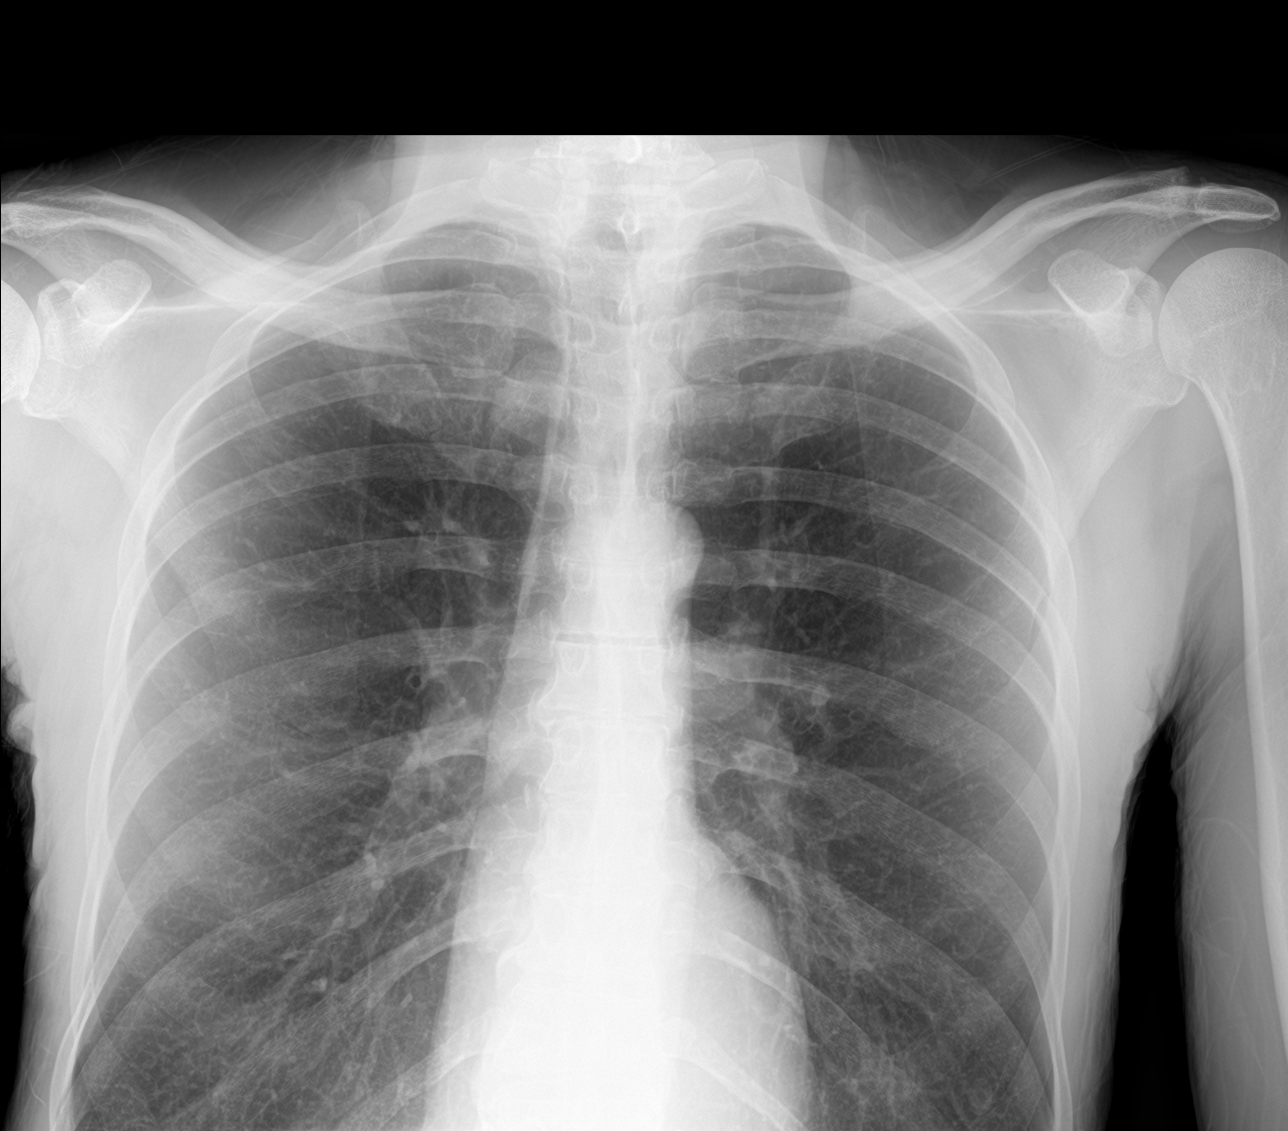

[chest ap (2 of 2)]
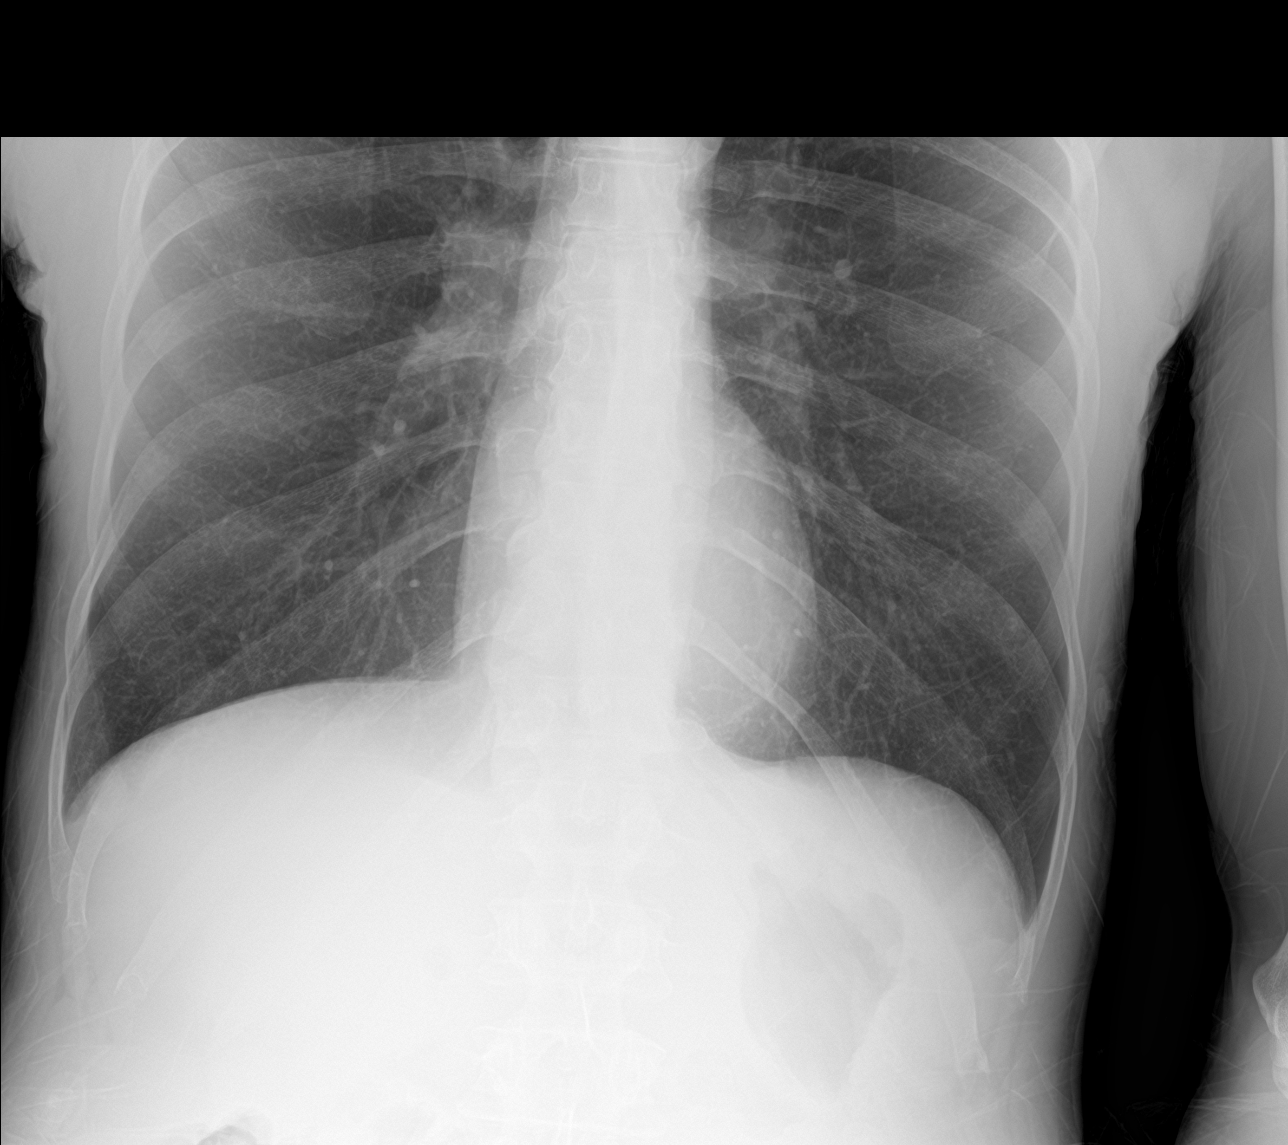

[3 of 3 positions shown; findings below may reference images not displayed]

FINDINGS: The cardiomediastinal silhouette is normal.

There is no focal consolidation or pulmonary edema. There is no
pleural effusion or pneumothorax

There is no acute osseous abnormality.
IMPRESSION: No radiographic evidence of acute cardiopulmonary process.

## 2024-01-21 ENCOUNTER — Emergency Department (HOSPITAL_COMMUNITY)
Admission: EM | Admit: 2024-01-21 | Discharge: 2024-01-22 | Disposition: A | Discharge status: Other Acute Inpatient Hospital | Payer: 59 | Attending: Emergency Medicine | Admitting: Emergency Medicine

## 2024-01-21 ENCOUNTER — Encounter (HOSPITAL_COMMUNITY): Payer: Self-pay | Admitting: Emergency Medicine

## 2024-01-21 ENCOUNTER — Other Ambulatory Visit: Payer: Self-pay

## 2024-01-21 DIAGNOSIS — F322 Major depressive disorder, single episode, severe without psychotic features: Secondary | ICD-10-CM | POA: Diagnosis present

## 2024-01-21 DIAGNOSIS — F1494 Cocaine use, unspecified with cocaine-induced mood disorder: Secondary | ICD-10-CM | POA: Diagnosis present

## 2024-01-21 DIAGNOSIS — R45851 Suicidal ideations: Secondary | ICD-10-CM | POA: Insufficient documentation

## 2024-01-21 DIAGNOSIS — R440 Auditory hallucinations: Secondary | ICD-10-CM | POA: Insufficient documentation

## 2024-01-21 NOTE — ED Triage Notes (Signed)
 Pt c/o several days of suicidal thoughts and hearing voices telling him to pour gasoline on himself and light himself on fire. Pt was taking zoloft  but stopped taking it about 3 weeks ago. Pt admits he did have suicidal intent but has not attempted self harm.

## 2024-01-21 NOTE — ED Notes (Signed)
 Pt unable to void at this time.  KM

## 2024-01-22 ENCOUNTER — Encounter: Payer: Self-pay | Admitting: Psychiatry

## 2024-01-22 ENCOUNTER — Inpatient Hospital Stay
Admission: AD | Admit: 2024-01-22 | Discharge: 2024-01-27 | DRG: 885 | Disposition: A | Discharge status: Home / Self Care | Payer: 59 | Source: Intra-hospital | Attending: Psychiatry | Admitting: Psychiatry

## 2024-01-22 DIAGNOSIS — F17290 Nicotine dependence, other tobacco product, uncomplicated: Secondary | ICD-10-CM | POA: Diagnosis present

## 2024-01-22 DIAGNOSIS — F1494 Cocaine use, unspecified with cocaine-induced mood disorder: Secondary | ICD-10-CM | POA: Diagnosis present

## 2024-01-22 DIAGNOSIS — Z1152 Encounter for screening for COVID-19: Secondary | ICD-10-CM | POA: Diagnosis not present

## 2024-01-22 DIAGNOSIS — G4700 Insomnia, unspecified: Secondary | ICD-10-CM | POA: Diagnosis present

## 2024-01-22 DIAGNOSIS — F322 Major depressive disorder, single episode, severe without psychotic features: Principal | ICD-10-CM | POA: Diagnosis present

## 2024-01-22 DIAGNOSIS — F17210 Nicotine dependence, cigarettes, uncomplicated: Secondary | ICD-10-CM | POA: Diagnosis present

## 2024-01-22 DIAGNOSIS — Z79899 Other long term (current) drug therapy: Secondary | ICD-10-CM | POA: Diagnosis not present

## 2024-01-22 DIAGNOSIS — Z5900 Homelessness unspecified: Secondary | ICD-10-CM

## 2024-01-22 DIAGNOSIS — R45851 Suicidal ideations: Secondary | ICD-10-CM | POA: Diagnosis present

## 2024-01-22 DIAGNOSIS — F1510 Other stimulant abuse, uncomplicated: Secondary | ICD-10-CM | POA: Diagnosis present

## 2024-01-22 DIAGNOSIS — Z5941 Food insecurity: Secondary | ICD-10-CM | POA: Diagnosis not present

## 2024-01-22 LAB — CBC
HCT: 48.4 % (ref 39.0–52.0)
Hemoglobin: 15.6 g/dL (ref 13.0–17.0)
MCH: 32.4 pg (ref 26.0–34.0)
MCHC: 32.2 g/dL (ref 30.0–36.0)
MCV: 100.6 fL — ABNORMAL HIGH (ref 80.0–100.0)
Platelets: 204 K/uL (ref 150–400)
RBC: 4.81 MIL/uL (ref 4.22–5.81)
RDW: 13.7 % (ref 11.5–15.5)
WBC: 5.6 K/uL (ref 4.0–10.5)
nRBC: 0 % (ref 0.0–0.2)

## 2024-01-22 LAB — COMPREHENSIVE METABOLIC PANEL WITH GFR
ALT: 20 U/L (ref 0–44)
AST: 35 U/L (ref 15–41)
Albumin: 3.6 g/dL (ref 3.5–5.0)
Alkaline Phosphatase: 32 U/L — ABNORMAL LOW (ref 38–126)
Anion gap: 8 (ref 5–15)
BUN: 18 mg/dL (ref 8–23)
CO2: 22 mmol/L (ref 22–32)
Calcium: 8.7 mg/dL — ABNORMAL LOW (ref 8.9–10.3)
Chloride: 110 mmol/L (ref 98–111)
Creatinine, Ser: 1.34 mg/dL — ABNORMAL HIGH (ref 0.61–1.24)
GFR, Estimated: >60 mL/min (ref >60)
Glucose, Bld: 86 mg/dL (ref 70–99)
Potassium: 4.1 mmol/L (ref 3.5–5.1)
Sodium: 140 mmol/L (ref 135–145)
Total Bilirubin: 0.8 mg/dL (ref 0.0–1.2)
Total Protein: 6 g/dL — ABNORMAL LOW (ref 6.5–8.1)

## 2024-01-22 LAB — RAPID URINE DRUG SCREEN, HOSP PERFORMED
Amphetamines: NOT DETECTED
Barbiturates: NOT DETECTED
Benzodiazepines: NOT DETECTED
Cocaine: POSITIVE — AB
Opiates: NOT DETECTED
Tetrahydrocannabinol: NOT DETECTED

## 2024-01-22 LAB — ACETAMINOPHEN LEVEL: Acetaminophen (Tylenol), Serum: <10 ug/mL — ABNORMAL LOW (ref 10–30)

## 2024-01-22 LAB — ETHANOL: Alcohol, Ethyl (B): <15 mg/dL (ref <15)

## 2024-01-22 LAB — SARS CORONAVIRUS 2 BY RT PCR: SARS Coronavirus 2 by RT PCR: NEGATIVE

## 2024-01-22 LAB — SALICYLATE LEVEL: Salicylate Lvl: <7 mg/dL — ABNORMAL LOW (ref 7.0–30.0)

## 2024-01-22 MED ORDER — MAGNESIUM HYDROXIDE 400 MG/5ML PO SUSP: 30.0000 mL | Freq: Every day | ORAL | Status: DC | PRN

## 2024-01-22 MED ORDER — TRAZODONE HCL 50 MG PO TABS
50.0000 mg | ORAL_TABLET | Freq: Every day | ORAL | Status: DC
  Administered 2024-01-22 – 2024-01-26 (×5): 50 mg via ORAL
  Filled 2024-01-22 (×6): qty 1

## 2024-01-22 MED ORDER — ALUM & MAG HYDROXIDE-SIMETH 200-200-20 MG/5ML PO SUSP
30.0000 mL | ORAL | Status: DC | PRN
  Administered 2024-01-25: 30 mL via ORAL
  Filled 2024-01-22: qty 30

## 2024-01-22 MED ORDER — SERTRALINE HCL 50 MG PO TABS
50.0000 mg | ORAL_TABLET | Freq: Every day | ORAL | Status: DC
Start: 2024-01-23 — End: 2024-01-27
  Administered 2024-01-23 – 2024-01-27 (×5): 50 mg via ORAL
  Filled 2024-01-22 (×5): qty 1

## 2024-01-22 MED ORDER — SERTRALINE HCL 50 MG PO TABS
50.0000 mg | ORAL_TABLET | Freq: Every day | ORAL | Status: DC
  Administered 2024-01-22: 50 mg via ORAL
  Filled 2024-01-22: qty 1

## 2024-01-22 MED ORDER — ACETAMINOPHEN 325 MG PO TABS
650.0000 mg | ORAL_TABLET | Freq: Four times a day (QID) | ORAL | Status: DC | PRN
  Administered 2024-01-24: 650 mg via ORAL
  Filled 2024-01-22: qty 2

## 2024-01-22 MED ORDER — TRAZODONE HCL 50 MG PO TABS: 50.0000 mg | ORAL_TABLET | Freq: Every day | ORAL | Status: DC

## 2024-01-22 MED ORDER — OLANZAPINE 5 MG PO TBDP: 5.0000 mg | ORAL_TABLET | Freq: Three times a day (TID) | ORAL | Status: DC | PRN

## 2024-01-22 MED ORDER — HYDROXYZINE HCL 25 MG PO TABS: 25.0000 mg | ORAL_TABLET | Freq: Three times a day (TID) | ORAL | Status: DC | PRN

## 2024-01-22 MED ORDER — OLANZAPINE 10 MG IM SOLR: 5.0000 mg | Freq: Three times a day (TID) | INTRAMUSCULAR | Status: DC | PRN

## 2024-01-22 NOTE — Plan of Care (Signed)
  Problem: Substance Use Goal: LTG: Richard Moon will improve quality of life by maintaining ongoing abstinence from all mood-altering substances Outcome: Not Progressing Goal: LTG: Richard Moon will increase coping skills to promote long-term recovery and improve ability to perform daily activities Outcome: Not Progressing

## 2024-01-22 NOTE — ED Provider Notes (Signed)
 [facility] AT [facility] Provider Note   CSN: [id number] Arrival date & time: 01/21/24  2306    Patient presents with: Suicidal and Hallucinations   Sharrod Achille is a 63 y.o. male here for evaluation of suicide ideation.  Patient states he has had suicidal thoughts over the last few days.  States he has a plan however is not willing to tell me as you will take it away from me.  He states he has been using cocaine and something else that I got but I do not know what it is.  He states when he uses cocaine he hears voices that tell him to harm himself.  He states he was going to pour gasoline on himself and light himself on fire however he decided to come here instead.  He was previously on medication for depression however has not been on this for many months.  He states he has been inpatient previously for suicide ideation.  He denies any alcohol use.  No headache, numbness, weakness, chest pain, shortness of breath abdominal pain, nausea or vomiting.  Denies any recent falls or injuries   HPI     Prior to Admission medications   Medication Sig Start Date End Date Taking? Authorizing Provider  sertraline  (ZOLOFT ) 50 MG tablet Take 1 tablet (50 mg total) by mouth daily. 06/08/23   Raliegh Marsa RAMAN, DO  traZODone  (DESYREL ) 50 MG tablet Take 1 tablet (50 mg total) by mouth at bedtime as needed for sleep. 06/08/23   Raliegh Marsa RAMAN, DO    Allergies: Patient has no known allergies.    Review of Systems  Constitutional: Negative.   HENT: Negative.    Respiratory: Negative.    Cardiovascular: Negative.   Gastrointestinal: Negative.   Genitourinary: Negative.   Musculoskeletal: Negative.   Skin: Negative.   Neurological: Negative.   Psychiatric/Behavioral:  Positive for hallucinations, sleep disturbance and suicidal ideas.   All other systems reviewed and are negative.   Updated Vital Signs BP 116/83 (BP Location: Left Arm)   Pulse 81    Temp 98 F (36.7 C)   Resp 15   Ht 5' 9 (1.753 m)   Wt 72.6 kg   SpO2 100%   BMI 23.63 kg/m   Physical Exam Vitals and nursing note reviewed.  Constitutional:      General: He is not in acute distress.    Appearance: He is well-developed. He is not ill-appearing, toxic-appearing or diaphoretic.  HENT:     Head: Normocephalic and atraumatic.  Eyes:     Pupils: Pupils are equal, round, and reactive to light.  Cardiovascular:     Rate and Rhythm: Normal rate and regular rhythm.     Pulses: Normal pulses.     Heart sounds: Normal heart sounds.  Pulmonary:     Effort: Pulmonary effort is normal. No respiratory distress.     Breath sounds: Normal breath sounds.  Abdominal:     General: Bowel sounds are normal. There is no distension.     Palpations: Abdomen is soft.     Tenderness: There is no abdominal tenderness. There is no guarding or rebound.  Musculoskeletal:        General: Normal range of motion.     Cervical back: Normal range of motion and neck supple.  Skin:    General: Skin is warm and dry.  Neurological:     General: No focal deficit present.     Mental Status: He is alert  and oriented to person, place, and time.     (all labs ordered are listed, but only abnormal results are displayed) Labs Reviewed  COMPREHENSIVE METABOLIC PANEL WITH GFR - Abnormal; Notable for the following components:      Result Value   Creatinine, Ser 1.34 (*)    Calcium 8.7 (*)    Total Protein 6.0 (*)    Alkaline Phosphatase 32 (*)    All other components within normal limits  CBC - Abnormal; Notable for the following components:   MCV 100.6 (*)    All other components within normal limits  SALICYLATE LEVEL - Abnormal; Notable for the following components:   Salicylate Lvl <7.0 (*)    All other components within normal limits  ACETAMINOPHEN  LEVEL - Abnormal; Notable for the following components:   Acetaminophen  (Tylenol ), Serum <10 (*)    All other components within normal  limits  ETHANOL  RAPID URINE DRUG SCREEN, HOSP PERFORMED    EKG: None  Radiology: No results found.   Procedures   Medications Ordered in the ED - No data to display  63 year old here for evaluation of SI.  States been going on for 2 days.  Depression has been worsening since he has been off his medication over the last few months.  States he was going to pour gasoline on himself and attempted let himself on fire however decided to come to the emergency department.  He admits to using cocaine and something else I bought.  He states when he uses cocaine he does occasionally have auditory hallucinations.  He denies any recent falls or injuries.  He has no other complaints.  Neurovascular intact.  Will plan on medical clearance.  Labs personally viewed and interpreted:  CBC without leukocytosis Metabolic panel creatinine 1.34--similar to prior Acetaminophen , ethanol, salicylate within normal limits   Patient medically cleared.  Disposition per psychiatry.                                  Medical Decision Making Amount and/or Complexity of Data Reviewed External Data Reviewed: labs and notes. Labs: ordered. Decision-making details documented in ED Course.  Risk Decision regarding hospitalization. Diagnosis or treatment significantly limited by social determinants of health.       Final diagnoses:  Suicidal ideation    ED Discharge Orders     None          Brithany Whitworth A, PA-C 01/22/24 [id number]    Bari Charmaine FALCON, MD 01/23/24 [phone]

## 2024-01-22 NOTE — ED Notes (Signed)
 Safe transport is on the way to take the patient to [facility]

## 2024-01-22 NOTE — Consult Note (Signed)
 [facility] Psychiatric Consult Initial  Patient Name: .Richard Moon  MRN: [id number]  DOB: August 28, 1960  Consult Order details:  Orders (From admission, onward)     Start     Ordered   01/22/24 0312  CONSULT TO CALL ACT TEAM       Ordering Provider: Edie Rosebud LABOR, PA-C  Provider:  (Not yet assigned)  Question:  Reason for Consult?  Answer:  Psych consult   01/22/24 0312             Mode of Visit: In person    Psychiatry Consult Evaluation  Service Date: January 22, 2024 LOS:  LOS: 0 days  Chief Complaint I do not know what is wrong with me  Primary Psychiatric Diagnoses  Major depressive disorder, severe recurrent 2.  Cocaine induced mood disorder   Assessment  Richard Moon is a 63 y.o. male admitted: Presented to the EDfor 01/21/2024 11:09 PM for suicidal thoughts.  He is hearing a voice that tells him to harm himself.SABRA He carries the psychiatric diagnoses of MDD, stimulant use disorder, cocaine induced mood disorder and no pertinent medical history.  His current presentation of depressive symptoms and suicidal ideations with a plan to stab self on fire in the context of cocaine use in the past 2 weeks is most consistent with major depressive disorder and cocaine induced mood disorder. He meets criteria for inpatient psychiatric admission current presentation.   He currently takes no psychotropic medications.    On initial examination, patient is observed laying in his bed asleep.  He is easily awakened.  He is alert/oriented x 4, cooperative, and attentive.  He is disheveled and makes good eye contact.  He has normal speech and behavior.  He had been living at [facility] and 2 weeks ago he walked away.  He has been homeless since that time.  He is unable to state why he left other than my depression was getting worse.  He has not been taking his Zoloft  and cannot remember when he last took it.  He relapsed on cocaine roughly 2 weeks ago.  He is unsure how much  cocaine he is actually using throughout the day and at times is not sure that he is even using cocaine.  UDS on admission was positive for cocaine.  He last smoked cocaine yesterday.  He is currently endorsing depression with feelings of hopelessness, helplessness, guilt, worthlessness, decreased motivation, decreased appetite and sleep.  He has not slept in days.  He currently is endorsing suicidal ideations with a plan to set himself on fire.  He is endorsing auditory hallucinations with a voice that is telling him to set himself on fire.  He mainly hears this voices in the context of cocaine use.  He is unable to verbally contract for safety.  He denies visual hallucinations.  He denies homicidal ideations.  He does not appear to be responding to internal/external stimuli.  He does not appear psychotic, manic, delusional, or paranoid.   Please see plan below for detailed recommendations.   Diagnoses:  Active Hospital problems: Principal Problem:   MDD (major depressive disorder), severe (HCC) Active Problems:   Cocaine-induced mood disorder (HCC)    Plan   ## Psychiatric Medication Recommendations:  Continue Zoloft  50 mg daily Continue trazodone  50 mg daily  ## Medical Decision Making Capacity: Not specifically addressed in this encounter  ## Further Work-up:  -- COVID PCR-being reviewed by [facility]. --Obtain EKG  -- most recent EKG on 01/22/2024 had QtC  of 418 -- Pertinent labwork reviewed earlier this admission includes: CMP CBC, BAL<15, UDS positive for cocaine   ## Disposition:-- We recommend inpatient psychiatric hospitalization when medically cleared. Patient is under voluntary admission status at this time; please IVC if attempts to leave hospital.  ## Behavioral / Environmental: -Utilize compassion and acknowledge the patient's experiences while setting clear and realistic expectations for care.    ## Safety and Observation Level:  - Based on my clinical evaluation, I estimate  the patient to be at low risk of self harm in the current setting. - At this time, we recommend  routine. This decision is based on my review of the chart including patient's history and current presentation, interview of the patient, mental status examination, and consideration of suicide risk including evaluating suicidal ideation, plan, intent, suicidal or self-harm behaviors, risk factors, and protective factors. This judgment is based on our ability to directly address suicide risk, implement suicide prevention strategies, and develop a safety plan while the patient is in the clinical setting. Please contact our team if there is a concern that risk level has changed.  CSSR Risk Category:C-SSRS RISK CATEGORY: High Risk  Suicide Risk Assessment: Patient has following modifiable risk factors for suicide: active suicidal ideation, untreated depression, medication noncompliance, current symptoms: anxiety/panic, insomnia, impulsivity, anhedonia, hopelessness, triggering events, and recent psychiatric hospitalization, which we are addressing by recommending inpatient psychiatric admission. Patient has following non-modifiable or demographic risk factors for suicide: male gender, history of suicide attempt, and psychiatric hospitalization Patient has the following protective factors against suicide: Access to outpatient mental health care and Cultural, spiritual, or religious beliefs that discourage suicide  Thank you for this consult request. Recommendations have been communicated to the primary team.  We will recommend inpatient psychiatric admission and continue to follow at this time.   Elveria VEAR Batter, NP       History of Present Illness  Relevant Aspects of Hospital ED Course:  Admitted on 01/21/2024 for  for suicidal thoughts.  He is hearing a voice that tells him to harm himself.SABRA He carries the psychiatric diagnoses of MDD, stimulant use disorder, cocaine induced mood disorder and no pertinent  medical history.  Patient Report:  I do not know what is wrong with me   Britni Henderly PA-C EDP, Isais Klipfel is a 63 y.o. male here for evaluation of suicide ideation.  Patient states he has had suicidal thoughts over the last few days.  States he has a plan however is not willing to tell me as you will take it away from me.  He states he has been using cocaine and something else that I got but I do not know what it is.  He states when he uses cocaine he hears voices that tell him to harm himself.  He states he was going to pour gasoline on himself and light himself on fire however he decided to come here instead.  He was previously on medication for depression however has not been on this for many months.  He states he has been inpatient previously for suicide ideation.  He denies any alcohol use.  No headache, numbness, weakness, chest pain, shortness of breath abdominal pain, nausea or vomiting.  Denies any recent falls or injuries   RN triage note on admission, Pt c/o several days of suicidal thoughts and hearing voices telling him to pour gasoline on himself and light himself on fire. Pt was taking zoloft  but stopped taking it about 3 weeks ago. Pt  admits he did have suicidal intent but has not attempted self harm.  Psych ROS:  Depression: endorses Anxiety: Endorses Mania (lifetime and current): Denies Psychosis: (lifetime and current): Endorses auditory hallucinations  Collateral information:  No collateral contacted   Review of Systems  Constitutional:  Negative for chills and fever.  Respiratory:  Negative for cough and shortness of breath.   Musculoskeletal: Negative.   Neurological:  Negative for tremors.  Psychiatric/Behavioral:  Positive for depression, hallucinations, substance abuse and suicidal ideas. The patient is nervous/anxious and has insomnia.      Psychiatric and Social History  Psychiatric History:  Information collected from chart review and  patient  Prev Dx/Sx: MDD, stimulant use disorder, cocaine induced mood disorder Current Psych Provider: Currently no provider in place Home Meds (current): Currently takes no medications Previous Med Trials: Zoloft  trazodone  Therapy: No therapy in place  Prior Psych Hospitalization: Multiple inpatient psychiatric admissions but [facility] and 01/2023 at [facility] Prior Self Harm: Denies Prior Violence: Denies  Family Psych History: Uncle has mental illness Family Hx suicide: Denies  Social History:  Developmental Hx: Denies Educational Hx: Completed 10th grade and has GED Occupational Hx: Unemployed Legal Hx: Denied Living Situation: Currently homeless Spiritual Hx: Christian Access to weapons/lethal means: Denies  Substance History Alcohol: Denies History of alcohol withdrawal seizures denies History of DT's denies Tobacco: Denies Illicit drugs: Cocaine Prescription drug abuse: Denies Rehab hx: Yes [facility]  Exam Findings  Physical Exam:  Vital Signs:  Temp:  [98 F (36.7 C)] 98 F (36.7 C) (08/01 2309) Pulse Rate:  [81] 81 (08/01 2309) Resp:  [15] 15 (08/01 2309) BP: (116)/(83) 116/83 (08/01 2309) SpO2:  [100 %] 100 % (08/01 2309) Weight:  [72.6 kg] 72.6 kg (08/01 2335) Blood pressure 116/83, pulse 81, temperature 98 F (36.7 C), resp. rate 15, height 5' 9 (1.753 m), weight 72.6 kg, SpO2 100%. Body mass index is 23.63 kg/m.  Physical Exam Constitutional:      Appearance: Normal appearance.  Pulmonary:     Effort: Pulmonary effort is normal. No respiratory distress.  Neurological:     Mental Status: He is alert and oriented to person, place, and time.  Psychiatric:        Attention and Perception: He perceives auditory hallucinations.        Mood and Affect: Mood is anxious and depressed.        Behavior: Behavior is actively hallucinating. Behavior is cooperative.        Thought Content: Thought content includes suicidal  ideation. Thought content includes suicidal plan.        Cognition and Memory: Cognition normal.        Judgment: Judgment is impulsive.     Mental Status Exam: General Appearance: Disheveled  Orientation:  Full (Time, Place, and Person)  Memory:  Immediate;   Good Recent;   Good Remote;   Good  Concentration:  Concentration: Fair and Attention Span: Fair  Recall:  Good  Attention  Good  Eye Contact:  Good  Speech:  Clear and Coherent and Normal Rate  Language:  Good  Volume:  Normal  Mood: Depressed  Affect:  Congruent  Thought Process:  Coherent  Thought Content:  Logical  Suicidal Thoughts:  Yes.  with intent/plan  Homicidal Thoughts:  No  Judgement:  Fair  Insight:  Fair  Psychomotor Activity:  Normal  Akathisia:  Negative  Fund of Knowledge:  Good      Assets:  Communication Skills Desire for Improvement Leisure Time Physical Health Resilience  Cognition:  WNL  ADL's:  Intact  AIMS (if indicated):        Other History   These have been pulled in through the EMR, reviewed, and updated if appropriate.  Family History:  The patient's family history is not on file.  Medical History: History reviewed. No pertinent past medical history.  Surgical History: Past Surgical History:  Procedure Laterality Date   HERNIA REPAIR       Medications:   Current Facility-Administered Medications:    sertraline  (ZOLOFT ) tablet 50 mg, 50 mg, Oral, Daily, Trifan, Donnice PARAS, MD   traZODone  (DESYREL ) tablet 50 mg, 50 mg, Oral, QHS, Trifan, Donnice PARAS, MD  Current Outpatient Medications:    sertraline  (ZOLOFT ) 50 MG tablet, Take 1 tablet (50 mg total) by mouth daily., Disp: 30 tablet, Rfl: 0   traZODone  (DESYREL ) 50 MG tablet, Take 1 tablet (50 mg total) by mouth at bedtime as needed for sleep., Disp: 30 tablet, Rfl: 0  Allergies: No Known Allergies  Elveria VEAR Batter, NP

## 2024-01-22 NOTE — Progress Notes (Signed)
 Patient is a 63 year old male admitted voluntarily to the [place] [facility] floor from [facility] ED after voicing suicidal ideation and experiencing AH telling him to set himself on fire. He is A+O x4. He currently denies SI/HI/VH. He currently endorses AH. "I can't understand what the voices are saying." Engaged in active listening. Patient's affect is appropriate and speech is logical and coherent. Patient endorses depression and anxiety but is unable to rank 1/10. He states that his main stressor is being homeless. Denies pain. Patient denies the use of a mobility aid at home. Wears reading glasses (on person). Last BM unknown. Patient denies smoking cigs regularly and is not interested in cessation. He endorses cocaine use. Denies alcohol use. Patient states that he lives alone and his homeless. When asked what his goals he would like to work on while admitted, patient states, I don't know.  Skin assessment and body search completed with Camellia, MHT. Skin: warm/dry, WNL. No contrabands found.  Emotional support and reassurance provided throughout admission intake. Consents signed. Afterwards, oriented patient to unit, room and call light, reviewed POC with all questions answered and concerns voiced. Patient verbalized understanding. Denies any needs at this time.  Will continue to monitor with ongoing Q 15 minute safety checks per unit protocol.

## 2024-01-22 NOTE — Group Note (Signed)
 Date:  01/22/2024 Time:  11:00 PM  Group Topic/Focus:  Identifying Needs:   The focus of this group is to help patients identify their personal needs that have been historically problematic and identify healthy behaviors to address their needs.    Participation Level:  Did Not Attend  Participation Quality:  Did Not Attend  Affect:  Did Not Attend  Cognitive:  Did Not Attend  Insight: None  Engagement in Group:  None  Modes of Intervention:  Education and Socialization  Additional Comments:    Richard Moon 01/22/2024, 11:00 PM

## 2024-01-22 NOTE — Group Note (Signed)
 Date:  01/22/2024 Time:  6:03 PM  Group Topic/Focus:  Early Warning Signs:   The focus of this group is to help patients identify signs or symptoms they exhibit before slipping into an unhealthy state or crisis.    Participation Level:  Did Not Attend   Richard Moon [name] 01/22/2024, 6:03 PM

## 2024-01-22 NOTE — Progress Notes (Signed)
 [facility] LCSW Progress Note   01/22/2024    12:57 PM  Richard Moon   [id number]   Type of Contact and Topic:  Psychiatric Bed Placement   Pt accepted to [facility] L31    Patient meets inpatient criteria per [name], NP    The attending provider will be Dr. [name]   Call report to [phone]   [name], RN @ [facility] ED notified.     Pt scheduled  to arrive at [facility] TODAY.    [name], MSW, LCSW-A  1:07 PM 01/22/2024

## 2024-01-22 NOTE — Tx Team (Signed)
 Initial Treatment Plan 01/22/2024 6:24 PM Satvik Parco FMW:[id number]    PATIENT STRESSORS: Financial difficulties   Medication change or noncompliance   Substance abuse     PATIENT STRENGTHS: Capable of independent living    PATIENT IDENTIFIED PROBLEMS:   anxiety  depression  homelessness               DISCHARGE CRITERIA:  Ability to meet basic life and health needs Adequate post-discharge living arrangements Improved stabilization in mood, thinking, and/or behavior Safe-care adequate arrangements made  PRELIMINARY DISCHARGE PLAN: Attend aftercare/continuing care group Placement in alternative living arrangements  PATIENT/FAMILY INVOLVEMENT: This treatment plan has been presented to and reviewed with the patient, Jasmeet Gehl. The patient has been given the opportunity to ask questions and make suggestions.   Garen CINDERELLA Daring, RN 01/22/2024, 6:24 PM

## 2024-01-23 DIAGNOSIS — F322 Major depressive disorder, single episode, severe without psychotic features: Secondary | ICD-10-CM

## 2024-01-23 NOTE — Group Note (Signed)
 [facility] LCSW Group Therapy Note   Group Date: 01/23/2024 Start Time: 1300 End Time: 1400   Type of Therapy/Topic:  Group Therapy:  Emotion Regulation  Participation Level:  Minimal   Mood:  Description of Group:    The purpose of this group is to assist patients in learning to regulate negative emotions and experience positive emotions. Patients will be guided to discuss ways in which they have been vulnerable to their negative emotions. These vulnerabilities will be juxtaposed with experiences of positive emotions or situations, and patients challenged to use positive emotions to combat negative ones. Special emphasis will be placed on coping with negative emotions in conflict situations, and patients will process healthy conflict resolution skills.  Therapeutic Goals: Patient will identify two positive emotions or experiences to reflect on in order to balance out negative emotions:  Patient will label two or more emotions that they find the most difficult to experience:  Patient will be able to demonstrate positive conflict resolution skills through discussion or role plays:   Summary of Patient Progress:   Patient was engaged with minimal participation in discussion.     Therapeutic Modalities:   Cognitive Behavioral Therapy Feelings Identification Dialectical Behavioral Therapy   Pamila Nine, LCSW

## 2024-01-23 NOTE — [facility] Suicide Risk Assessment (Signed)
 [facility] INPATIENT:  Family/Significant Other Suicide Prevention Education  Suicide Prevention Education:  Patient Refusal for Family/Significant Other Suicide Prevention Education: The patient Richard Moon has refused to provide written consent for family/significant other to be provided Family/Significant Other Suicide Prevention Education during admission and/or prior to discharge.  Physician notified.  Pamila Nine 01/23/2024, 4:05 PM

## 2024-01-23 NOTE — Plan of Care (Signed)
 Pt. Denies SI/HI and AVH this shift

## 2024-01-23 NOTE — BHH Counselor (Signed)
 Adult Comprehensive Assessment  Patient ID: Richard Moon, male   DOB: 08-09-1960, 63 y.o.   MRN: [id number]  Information Source: Information source: Patient  Current Stressors:  Patient states their primary concerns and needs for treatment are:: depressed and feeling down Patient states their goals for this hospitilization and ongoing recovery are:: to get into treatment Educational / Learning stressors: none to report Employment / Job issues: unemployed Family Relationships: none to report Surveyor, quantity / Lack of resources (include bankruptcy): unable to afford basic needs  I sell plasma to help Housing / Lack of housing: unhoused Physical health (include injuries & life threatening diseases): none to report Social relationships: none to report Substance abuse: cocaine daily Bereavement / Loss: none to report  Living/Environment/Situation:  Living Arrangements: Alone Living conditions (as described by patient or guardian): on the streets Who else lives in the home?: no one How long has patient lived in current situation?: 3 months  Family History:  Does patient have children?: Yes How many children?: 1 How is patient's relationship with their children?: One daughter. Son died by suicide  Childhood History:  By whom was/is the patient raised?: Both parents Additional childhood history information: Father was an alcoholic Description of patient's relationship with caregiver when they were a child: Patient states that he was always closest to his mother, but he loved his father How were you disciplined when you got in trouble as a child/adolescent?: reports being physically reprimanded WNL Did patient suffer any verbal/emotional/physical/sexual abuse as a child?: No Has patient ever been sexually abused/assaulted/raped as an adolescent or adult?: No Was the patient ever a victim of a crime or a disaster?: No Witnessed domestic violence?: Yes Has patient been affected by  domestic violence as an adult?: No  Education:  Highest grade of school patient has completed: 12th Currently a student?: No Learning disability?: No  Employment/Work Situation:   Employment Situation: Unemployed Patient's Job has Been Impacted by Current Illness: Yes Describe how Patient's Job has Been Impacted: Pt says he lost his job What is the Longest Time Patient has Held a Job?: 4 years Where was the Patient Employed at that Time?: [employer], maintenence Has Patient ever Been in the [employer]?: No  Financial Resources:   Financial resources: No income Does patient have a Lawyer or guardian?: No  Alcohol/Substance Abuse:   What has been your use of drugs/alcohol within the last 12 months?: Daily use of cocaine If attempted suicide, did drugs/alcohol play a role in this?: No Alcohol/Substance Abuse Treatment Hx: Past Tx, Inpatient If yes, describe treatment: [facility], [facility]  Social Support System:   Patient's Community Support System: None Describe Community Support System: Patient reports that he does have a support system  Leisure/Recreation:   Do You Have Hobbies?: Yes Leisure and Hobbies: play games,read  Strengths/Needs:   What is the patient's perception of their strengths?: I never give up Patient states these barriers may affect/interfere with their treatment: Food and housing  Discharge Plan:   Currently receiving community mental health services: No Does patient have access to transportation?: Yes (Patient states he takes public transportation or walk) Does patient have financial barriers related to discharge medications?: Yes Patient description of barriers related to discharge medications: PAtient currently unemployed Will patient be returning to same living situation after discharge?: Yes  Summary/Recommendations:   Summary and Recommendations (to be completed by the evaluator): Richard Moon is a 63 y/o AA male admitted on  01/22/2024 with a diagnosis of Major Depressive Disorder.  Patient presented to the ED reporting that he was suicidal without a plan and hearing voices telling him to harm himself. Patients stressors are unemployment, barries and access to food and housing, and active use of cocaine daily. Patient is currenlty unhoused and living on the streets, occassionally selling plasma for money to provide for himself. Patient would like to go back to [facility] for addiction treatment. Patient will benefit from  detox, crisis stabilization, medication evaluation, group therapy and psychoeducation, in addition to case management for discharge planning. At discharge it is recommended that Patient adhere to the established discharge plan and continue in treatment.  Richard Moon. 01/23/2024

## 2024-01-23 NOTE — H&P (Addendum)
 Psychiatric Admission Assessment Adult  Patient Identification: Richard Moon MRN:  [id number] Date of Evaluation:  01/23/2024 Chief Complaint:  MDD (major depressive disorder), severe (HCC) [F32.2]   History of Present Illness: Patient is a 63 year old male who has been homeless for some time he was seen in his room today he reports significant challenges with perceptual disturbances but does not appear to be responding to internal stimuli he states that he cannot tell them what the voices were saying but he was overwhelmed he has been homeless for some time he reports that he has been smoking Cocaine for several years now endorses vague suicidal thoughts with no specific plan denies any homicidal thoughts, vague perceptual disturbances reported no symptoms of mania hypomania noted as such.  Total Time spent with patient: 45 minutes Sleep  Sleep:No data recorded Past Psychiatric History: History of cocaine abuse Psychiatric History:  Information collected from patient and chart  Prev Dx/Sx: Cocaine use disorder Current Psych Provider: None Home Meds (current): None Previous Med Trials: Unknown Therapy: None  Prior Psych Hospitalization: Yes Prior Self Harm: No Prior Violence: Yes  Family Psych History: History of depression Family Hx suicide: No  Social History:  Developmental Hx: No challenges Educational Hx: High school Occupational Hx: None Legal Hx: Some Living Situation: Homeless Spiritual Hx: None significant Access to weapons/lethal means: None  Substance History Alcohol: At times Type of alcohol before hospitalization couple of Last Drink days ago Number of drinks per day few History of alcohol withdrawal seizures no History of DT's no Tobacco: Yes Illicit drugs: Cocaine Prescription drug abuse: No Rehab hx: No Is the patient at risk to self? No.  Has the patient been a risk to self in the past 6 months? No.  Has the patient been a risk to self within the  distant past? No.  Is the patient a risk to others? No.  Has the patient been a risk to others in the past 6 months? No.  Has the patient been a risk to others within the distant past? No.   [place] Scale:  Flowsheet Row Admission (Current) from 01/22/2024 in [facility] [place] ED from 01/21/2024 in [facility] Emergency Department at [facility] ED from 06/21/2023 in [facility] Emergency Department at [facility]  C-SSRS RISK CATEGORY High Risk High Risk Error: Q3, 4, or 5 should not be populated when Q2 is No     Past Medical History: History reviewed. No pertinent past medical history.  Past Surgical History:  Procedure Laterality Date   HERNIA REPAIR     Family History: History reviewed. No pertinent family history.  Social History:  Social History   Substance and Sexual Activity  Alcohol Use Yes   Comment: Occasional alcohol use     Social History   Substance and Sexual Activity  Drug Use Yes   Types: Marijuana, Cocaine, Heroin   Comment: daily cocaine use (reported $200-300/ daily); occasional cannabis use      Allergies:  No Known Allergies Lab Results:  Results for orders placed or performed during the hospital encounter of 01/21/24 (from the past 48 hours)  Comprehensive metabolic panel     Status: Abnormal   Collection Time: 01/21/24 11:50 PM  Result Value Ref Range   Sodium 140 135 - 145 mmol/L   Potassium 4.1 3.5 - 5.1 mmol/L   Chloride 110 98 - 111 mmol/L   CO2 22 22 - 32 mmol/L   Glucose, Bld 86 70 - 99 mg/dL  Comment: Glucose reference range applies only to samples taken after fasting for at least 8 hours.   BUN 18 8 - 23 mg/dL   Creatinine, Ser 8.65 (H) 0.61 - 1.24 mg/dL   Calcium 8.7 (L) 8.9 - 10.3 mg/dL   Total Protein 6.0 (L) 6.5 - 8.1 g/dL   Albumin 3.6 3.5 - 5.0 g/dL   AST 35 15 - 41 U/L   ALT 20 0 - 44 U/L   Alkaline Phosphatase 32 (L) 38 - 126 U/L   Total Bilirubin 0.8 0.0 - 1.2 mg/dL   GFR, Estimated >39  >39 mL/min    Comment: (NOTE) Calculated using the CKD-EPI Creatinine Equation (2021)    Anion gap 8 5 - 15    Comment: Performed at [facility] Lab, [address] [place], [place] [address]  Ethanol     Status: None   Collection Time: 01/21/24 11:50 PM  Result Value Ref Range   Alcohol, Ethyl (B) <15 <15 mg/dL    Comment: (NOTE) For medical purposes only. Performed at [facility] Lab, [address], [place], [place] [address]   cbc     Status: Abnormal   Collection Time: 01/21/24 11:50 PM  Result Value Ref Range   WBC 5.6 4.0 - 10.5 K/uL   RBC 4.81 4.22 - 5.81 MIL/uL   Hemoglobin 15.6 13.0 - 17.0 g/dL   HCT 51.5 60.9 - 47.9 %   MCV 100.6 (H) 80.0 - 100.0 fL   MCH 32.4 26.0 - 34.0 pg   MCHC 32.2 30.0 - 36.0 g/dL   RDW 86.2 88.4 - 84.4 %   Platelets 204 150 - 400 K/uL   nRBC 0.0 0.0 - 0.2 %    Comment: Performed at [facility] Lab, [address], [place], [place] [address]  Salicylate level     Status: Abnormal   Collection Time: 01/21/24 11:50 PM  Result Value Ref Range   Salicylate Lvl <7.0 (L) 7.0 - 30.0 mg/dL    Comment: Performed at [facility] Lab, [address] [place], [place] [address]  Acetaminophen  level     Status: Abnormal   Collection Time: 01/21/24 11:50 PM  Result Value Ref Range   Acetaminophen  (Tylenol ), Serum <10 (L) 10 - 30 ug/mL    Comment: (NOTE) Therapeutic concentrations vary significantly. A range of 10-30 ug/mL  may be an effective concentration for many patients. However, some  are best treated at concentrations outside of this range. Acetaminophen  concentrations >150 ug/mL at 4 hours after ingestion  and >50 ug/mL at 12 hours after ingestion are often associated with  toxic reactions.  Performed at [facility] Lab, [address], [place], [place] [address]   Rapid urine drug screen (hospital performed)     Status: Abnormal   Collection Time: 01/22/24  6:32 AM  Result Value Ref Range   Opiates NONE DETECTED NONE  DETECTED   Cocaine POSITIVE (A) NONE DETECTED   Benzodiazepines NONE DETECTED NONE DETECTED   Amphetamines NONE DETECTED NONE DETECTED   Tetrahydrocannabinol NONE DETECTED NONE DETECTED   Barbiturates NONE DETECTED NONE DETECTED    Comment: (NOTE) DRUG SCREEN FOR MEDICAL PURPOSES ONLY.  IF CONFIRMATION IS NEEDED FOR ANY PURPOSE, NOTIFY LAB WITHIN 5 DAYS.  LOWEST DETECTABLE LIMITS FOR URINE DRUG SCREEN Drug Class                     Cutoff (ng/mL) Amphetamine and metabolites    1000 Barbiturate and  metabolites    200 Benzodiazepine                 200 Opiates and metabolites        300 Cocaine and metabolites        300 THC                            50 Performed at [facility] Lab, [address] [place], [place] [address]   SARS Coronavirus 2 by RT PCR (hospital order, performed in [facility] hospital lab) *cepheid single result test* Anterior Nasal Swab     Status: None   Collection Time: 01/22/24 11:30 AM   Specimen: Anterior Nasal Swab  Result Value Ref Range   SARS Coronavirus 2 by RT PCR NEGATIVE NEGATIVE    Comment: Performed at [facility] Lab, [address], [place], [place] [address]    Blood Alcohol level:  Lab Results  Component Value Date   [facility] <15 01/21/2024   ETH <10 06/04/2023    Metabolic Disorder Labs:  Lab Results  Component Value Date   HGBA1C 6.0 (H) 02/09/2023   MPG 125.5 02/09/2023   MPG 119.76 04/02/2021   No results found for: PROLACTIN Lab Results  Component Value Date   CHOL 174 02/09/2023   TRIG 148 02/09/2023   HDL 79 02/09/2023   CHOLHDL 2.2 02/09/2023   VLDL 30 02/09/2023   LDLCALC 65 02/09/2023   LDLCALC 99 04/02/2021    Current Medications: Current Facility-Administered Medications  Medication Dose Route Frequency Provider Last Rate Last Admin   acetaminophen  (TYLENOL ) tablet 650 mg  650 mg Oral Q6H PRN Coleman, Carolyn H, NP       alum & mag hydroxide-simeth (MAALOX/MYLANTA) 200-200-20 MG/5ML suspension 30 mL   30 mL Oral Q4H PRN Coleman, Carolyn H, NP       hydrOXYzine  (ATARAX ) tablet 25 mg  25 mg Oral TID PRN Mardy Elveria DEL, NP       magnesium  hydroxide (MILK OF MAGNESIA) suspension 30 mL  30 mL Oral Daily PRN Mardy Elveria DEL, NP       OLANZapine  (ZYPREXA ) injection 5 mg  5 mg Intramuscular TID PRN Mardy Elveria DEL, NP       OLANZapine  zydis (ZYPREXA ) disintegrating tablet 5 mg  5 mg Oral TID PRN Mardy Elveria DEL, NP       sertraline  (ZOLOFT ) tablet 50 mg  50 mg Oral Daily Coleman, Carolyn H, NP   50 mg at 01/23/24 0940   traZODone  (DESYREL ) tablet 50 mg  50 mg Oral QHS Coleman, Carolyn H, NP   50 mg at 01/22/24 2313   PTA Medications: Medications Prior to Admission  Medication Sig Dispense Refill Last Dose/Taking   sertraline  (ZOLOFT ) 50 MG tablet Take 1 tablet (50 mg total) by mouth daily. 30 tablet 0    traZODone  (DESYREL ) 50 MG tablet Take 1 tablet (50 mg total) by mouth at bedtime as needed for sleep. 30 tablet 0    Examination heart and lungs normal breath sounds normal S1-S2 no murmurs noted Psychiatric Specialty Exam:  Presentation  General Appearance:  Appropriate for Environment  Eye Contact: Good  Speech: Clear and Coherent  Speech Volume: Normal    Mood and Affect  Mood: Depressed  Affect: Congruent   Thought Process  Thought Processes: Coherent  Descriptions of Associations:Intact  Orientation:Full (Time, Place and Person)  Thought Content:Logical; WDL  Hallucinations:No data recorded Ideas  of Reference:None  Suicidal Thoughts:No data recorded Homicidal Thoughts:No data recorded  Sensorium  Memory: Remote Good  Judgment: Fair  Insight: Shallow   Executive Functions  Concentration: Good  Attention Span: Good  Recall: Good  Fund of Knowledge: Good  Language: Good   Psychomotor Activity  Psychomotor Activity:No data recorded  Assets  Assets: Communication Skills; Desire for Improvement;  Resilience    Musculoskeletal: Strength & Muscle Tone: within normal limits Gait & Station: normal  Physical Exam: Physical Exam ROS Blood pressure 105/77, pulse 63, temperature (!) 97.5 F (36.4 C), resp. rate 15, height 5' 9 (1.753 m), weight 70.8 kg, SpO2 98%. Body mass index is 23.04 kg/m.  Principal Diagnosis: MDD (major depressive disorder), severe (HCC) Diagnosis:  Principal Problem:   MDD (major depressive disorder), severe (HCC)   Clinical Decision Making:  Treatment Plan Summary:  Safety and Monitoring:             -- Voluntary admission to inpatient psychiatric unit for safety, stabilization and treatment             -- Daily contact with patient to assess and evaluate symptoms and progress in treatment             -- Patient's case to be discussed in multi-disciplinary team meeting             -- Observation Level: q15 minute checks             -- Vital signs:  q12 hours             -- Precautions: suicide, elopement, and assault   2. Psychiatric Diagnoses and Treatment:                 Current Facility-Administered Medications  Medication Dose Route Frequency Provider Last Rate Last Admin   acetaminophen  (TYLENOL ) tablet 650 mg  650 mg Oral Q6H PRN Coleman, Carolyn H, NP       alum & mag hydroxide-simeth (MAALOX/MYLANTA) 200-200-20 MG/5ML suspension 30 mL  30 mL Oral Q4H PRN Coleman, Carolyn H, NP       hydrOXYzine  (ATARAX ) tablet 25 mg  25 mg Oral TID PRN Coleman, Carolyn H, NP       magnesium  hydroxide (MILK OF MAGNESIA) suspension 30 mL  30 mL Oral Daily PRN Coleman, Carolyn H, NP       OLANZapine  (ZYPREXA ) injection 5 mg  5 mg Intramuscular TID PRN Coleman, Carolyn H, NP       OLANZapine  zydis (ZYPREXA ) disintegrating tablet 5 mg  5 mg Oral TID PRN Mardy Elveria DEL, NP       sertraline  (ZOLOFT ) tablet 50 mg  50 mg Oral Daily Coleman, Carolyn H, NP   50 mg at 01/23/24 0940   traZODone  (DESYREL ) tablet 50 mg  50 mg Oral QHS Coleman, Carolyn H, NP   50 mg  at 01/22/24 2313      -- The risks/benefits/side-effects/alternatives to this medication were discussed in detail with the patient and time was given for questions. The patient consents to medication trial.                -- Metabolic profile and EKG monitoring obtained while on an atypical antipsychotic (BMI: Lipid Panel: HbgA1c: QTc:)              -- Encouraged patient to participate in unit milieu and in scheduled group therapies  3. Medical Issues Being Addressed:      4. Discharge Planning:              -- Social work and case management to assist with discharge planning and identification of hospital follow-up needs prior to discharge             -- Estimated LOS: 5-7 days             -- Discharge Concerns: Need to establish a safety plan; Medication compliance and effectiveness             -- Discharge Goals: Return home with outpatient referrals follow ups  Physician Treatment Plan for Primary Diagnosis: MDD (major depressive disorder), severe (HCC) Long Term Goal(s): Improvement in symptoms so as ready for discharge  Short Term Goals: Ability to identify changes in lifestyle to reduce recurrence of condition will improve  Physician Treatment Plan for Secondary Diagnosis: Principal Problem:   MDD (major depressive disorder), severe (HCC)  Long Term Goal(s): Improvement in symptoms so as ready for discharge  Short Term Goals: Ability to identify changes in lifestyle to reduce recurrence of condition will improve  I certify that inpatient services furnished can reasonably be expected to improve the patient's condition.    Tayshon Winker R Amarii Bordas, MD 8/3/20259:09 PM

## 2024-01-23 NOTE — Group Note (Signed)
 Date:  01/23/2024 Time:  10:38 AM  Group Topic/Focus:  Overcoming Stress:   The focus of this group is to define stress and help patients assess their triggers.    Participation Level:  Did Not Attend   Arland Nutting 01/23/2024, 10:38 AM

## 2024-01-24 DIAGNOSIS — F322 Major depressive disorder, single episode, severe without psychotic features: Secondary | ICD-10-CM | POA: Diagnosis not present

## 2024-01-24 NOTE — Group Note (Signed)
 Recreation Therapy Group Note   Group Topic:Leisure Education  Group Date: 01/24/2024 Start Time: 1500 End Time: 1600 Facilitators: Celestia Jeoffrey BRAVO, LRT, CTRS Location: Courtyard  Group Description: Music. Patients encouraged to name their favorite song(s) for LRT to play song through speaker for group to hear, while in the courtyard getting fresh air and sunlight. Patients educated on the definition of leisure and the importance of having different leisure interests outside of the hospital. Group discussed how leisure activities can often be used as Pharmacologist and that listening to music and being outside are examples.    Goal Area(s) Addressed:  Patient will identify a current leisure interest.  Patient will practice making a positive decision. Patient will have the opportunity to try a new leisure activity.   Affect/Mood: N/A   Participation Level: Did not attend    Clinical Observations/Individualized Feedback: Patient did not attend group.   Plan: Continue to engage patient in RT group sessions 2-3x/week.   Jeoffrey BRAVO Celestia, LRT, CTRS 01/24/2024 5:15 PM

## 2024-01-24 NOTE — Progress Notes (Signed)
   01/24/24 0100  Psych Admission Type (Psych Patients Only)  Admission Status Voluntary  Psychosocial Assessment  Patient Complaints None  Eye Contact Brief  Facial Expression Fixed smile  Affect Appropriate to circumstance  Speech Logical/coherent  Interaction Assertive  Motor Activity Slow  Appearance/Hygiene In scrubs  Behavior Characteristics Cooperative;Guarded  Mood Pleasant  Thought Process  Coherency WDL  Content WDL  Delusions None reported or observed  Perception WDL  Hallucination None reported or observed  Judgment Impaired  Confusion WDL  Danger to Self  Current suicidal ideation? Denies  Agreement Not to Harm Self Yes  Description of Agreement verbal

## 2024-01-24 NOTE — Plan of Care (Signed)
  Problem: Education: Goal: Utilization of techniques to improve thought processes will improve Outcome: Progressing Goal: Knowledge of the prescribed therapeutic regimen will improve Outcome: Progressing   Problem: Substance Use Goal: LTG: Ranger will improve quality of life by maintaining ongoing abstinence from all mood-altering substances Outcome: Progressing Goal: LTG: Sascha will increase coping skills to promote long-term recovery and improve ability to perform daily activities Outcome: Progressing

## 2024-01-24 NOTE — Group Note (Signed)
 Date:  01/24/2024 Time:  10:43 PM  Group Topic/Focus:  Self Care:   The focus of this group is to help patients understand the importance of self-care in order to improve or restore emotional, physical, spiritual, interpersonal, and financial health.    Participation Level:  Active  Participation Quality:  Appropriate  Affect:  Appropriate  Cognitive:  Appropriate  Insight: Appropriate  Engagement in Group:  Engaged  Modes of Intervention:  Education  Additional Comments:    Richard Moon 01/24/2024, 10:43 PM

## 2024-01-24 NOTE — Group Note (Signed)
 Date:  01/24/2024 Time:  2:25 PM  Group Topic/Focus:  Making Healthy Choices:   The focus of this group is to help patients identify negative/unhealthy choices they were using prior to admission and identify positive/healthier coping strategies to replace them upon discharge.    Participation Level:  Active  Participation Quality:  Appropriate  Affect:  Appropriate  Cognitive:  Appropriate  Insight: Appropriate  Engagement in Group:  Engaged  Modes of Intervention:  Activity  Additional Comments:    Rihan Schueler 01/24/2024, 2:25 PM

## 2024-01-24 NOTE — Plan of Care (Signed)
  Problem: Education: Goal: Ability to state activities that reduce stress will improve Outcome: Not Met (add Reason)   Problem: Education: Goal: Utilization of techniques to improve thought processes will improve 01/24/2024 0726 by Ann Orlean LABOR, RN Outcome: Not Met (add Reason) 01/24/2024 0723 by Ann Orlean LABOR, RN Outcome: Progressing Goal: Knowledge of the prescribed therapeutic regimen will improve 01/24/2024 0726 by Ann Orlean LABOR, RN Outcome: Not Met (add Reason) 01/24/2024 0723 by Ann Orlean LABOR, RN Outcome: Progressing   Problem: Substance Use Goal: LTG: Yuta will improve quality of life by maintaining ongoing abstinence from all mood-altering substances 01/24/2024 0726 by Ann Orlean LABOR, RN Outcome: Not Met (add Reason) 01/24/2024 0725 by Ann Orlean LABOR, RN Outcome: Progressing 01/24/2024 0723 by Ann Orlean LABOR, RN Outcome: Progressing Goal: LTG: Tanda will increase coping skills to promote long-term recovery and improve ability to perform daily activities 01/24/2024 0726 by Ann Orlean LABOR, RN Outcome: Not Met (add Reason) 01/24/2024 0725 by Ann Orlean LABOR, RN Outcome: Progressing 01/24/2024 0723 by Ann Orlean LABOR, RN Outcome: Progressing

## 2024-01-24 NOTE — Plan of Care (Signed)
  Problem: Education: Goal: Ability to state activities that reduce stress will improve 01/24/2024 0745 by Ann Orlean LABOR, RN Outcome: Adequate for Discharge 01/24/2024 0726 by Ann Orlean LABOR, RN Outcome: Not Met (add Reason)   Problem: Substance Use Goal: LTG: Turner will improve quality of life by maintaining ongoing abstinence from all mood-altering substances 01/24/2024 0745 by Ann Orlean LABOR, RN Outcome: Adequate for Discharge 01/24/2024 0726 by Ann Orlean LABOR, RN Outcome: Not Met (add Reason) 01/24/2024 0725 by Ann Orlean LABOR, RN Outcome: Progressing 01/24/2024 0723 by Ann Orlean LABOR, RN Outcome: Progressing Goal: LTG: Tanda will increase coping skills to promote long-term recovery and improve ability to perform daily activities 01/24/2024 0745 by Ann Orlean LABOR, RN Outcome: Adequate for Discharge 01/24/2024 0726 by Ann Orlean LABOR, RN Outcome: Not Met (add Reason) 01/24/2024 0725 by Ann Orlean LABOR, RN Outcome: Progressing 01/24/2024 0723 by Ann Orlean LABOR, RN Outcome: Progressing

## 2024-01-24 NOTE — BH IP Treatment Plan (Signed)
 Interdisciplinary Treatment and Diagnostic Plan Update  01/24/2024 Time of Session: 10:27 AM  Richard Moon MRN: [id number]  Principal Diagnosis: MDD (major depressive disorder), severe (HCC)  Secondary Diagnoses: Principal Problem:   MDD (major depressive disorder), severe (HCC)   Current Medications:  Current Facility-Administered Medications  Medication Dose Route Frequency Provider Last Rate Last Admin   acetaminophen  (TYLENOL ) tablet 650 mg  650 mg Oral Q6H PRN Coleman, Carolyn H, NP       alum & mag hydroxide-simeth (MAALOX/MYLANTA) 200-200-20 MG/5ML suspension 30 mL  30 mL Oral Q4H PRN Coleman, Carolyn H, NP       hydrOXYzine  (ATARAX ) tablet 25 mg  25 mg Oral TID PRN Mardy Elveria DEL, NP       magnesium  hydroxide (MILK OF MAGNESIA) suspension 30 mL  30 mL Oral Daily PRN Coleman, Carolyn H, NP       OLANZapine  (ZYPREXA ) injection 5 mg  5 mg Intramuscular TID PRN Coleman, Carolyn H, NP       OLANZapine  zydis (ZYPREXA ) disintegrating tablet 5 mg  5 mg Oral TID PRN Coleman, Carolyn H, NP       sertraline  (ZOLOFT ) tablet 50 mg  50 mg Oral Daily Coleman, Carolyn H, NP   50 mg at 01/24/24 1057   traZODone  (DESYREL ) tablet 50 mg  50 mg Oral QHS Coleman, Carolyn H, NP   50 mg at 01/23/24 2237   PTA Medications: Medications Prior to Admission  Medication Sig Dispense Refill Last Dose/Taking   sertraline  (ZOLOFT ) 50 MG tablet Take 1 tablet (50 mg total) by mouth daily. 30 tablet 0    traZODone  (DESYREL ) 50 MG tablet Take 1 tablet (50 mg total) by mouth at bedtime as needed for sleep. 30 tablet 0     Patient Stressors: Financial difficulties   Medication change or noncompliance   Substance abuse    Patient Strengths: Capable of independent living   Treatment Modalities: Medication Management, Group therapy, Case management,  1 to 1 session with clinician, Psychoeducation, Recreational therapy.   Physician Treatment Plan for Primary Diagnosis: MDD (major depressive disorder),  severe (HCC) Long Term Goal(s): Improvement in symptoms so as ready for discharge   Short Term Goals: Ability to identify changes in lifestyle to reduce recurrence of condition will improve  Medication Management: Evaluate patient's response, side effects, and tolerance of medication regimen.  Therapeutic Interventions: 1 to 1 sessions, Unit Group sessions and Medication administration.  Evaluation of Outcomes: Progressing  Physician Treatment Plan for Secondary Diagnosis: Principal Problem:   MDD (major depressive disorder), severe (HCC)  Long Term Goal(s): Improvement in symptoms so as ready for discharge   Short Term Goals: Ability to identify changes in lifestyle to reduce recurrence of condition will improve     Medication Management: Evaluate patient's response, side effects, and tolerance of medication regimen.  Therapeutic Interventions: 1 to 1 sessions, Unit Group sessions and Medication administration.  Evaluation of Outcomes: Progressing   RN Treatment Plan for Primary Diagnosis: MDD (major depressive disorder), severe (HCC) Long Term Goal(s): Knowledge of disease and therapeutic regimen to maintain health will improve  Short Term Goals: Ability to remain free from injury will improve, Ability to verbalize frustration and anger appropriately will improve, Ability to demonstrate self-control, Ability to participate in decision making will improve, Ability to verbalize feelings will improve, Ability to disclose and discuss suicidal ideas, Ability to identify and develop effective coping behaviors will improve, and Compliance with prescribed medications will improve  Medication Management: RN will administer medications  as ordered by provider, will assess and evaluate patient's response and provide education to patient for prescribed medication. RN will report any adverse and/or side effects to prescribing provider.  Therapeutic Interventions: 1 on 1 counseling sessions,  Psychoeducation, Medication administration, Evaluate responses to treatment, Monitor vital signs and CBGs as ordered, Perform/monitor CIWA, COWS, AIMS and Fall Risk screenings as ordered, Perform wound care treatments as ordered.  Evaluation of Outcomes: Progressing   LCSW Treatment Plan for Primary Diagnosis: MDD (major depressive disorder), severe (HCC) Long Term Goal(s): Safe transition to appropriate next level of care at discharge, Engage patient in therapeutic group addressing interpersonal concerns.  Short Term Goals: Engage patient in aftercare planning with referrals and resources, Increase social support, Increase ability to appropriately verbalize feelings, Increase emotional regulation, Facilitate acceptance of mental health diagnosis and concerns, Facilitate patient progression through stages of change regarding substance use diagnoses and concerns, Identify triggers associated with mental health/substance abuse issues, and Increase skills for wellness and recovery  Therapeutic Interventions: Assess for all discharge needs, 1 to 1 time with Social worker, Explore available resources and support systems, Assess for adequacy in community support network, Educate family and significant other(s) on suicide prevention, Complete Psychosocial Assessment, Interpersonal group therapy.  Evaluation of Outcomes: Progressing   Progress in Treatment: Attending groups: Yes. and No. Participating in groups: Yes. and No. Taking medication as prescribed: Yes. and No. Toleration medication: Yes. and No. Family/Significant other contact made: No, will contact:  CSW will contct if given permission  Patient understands diagnosis: Yes Discussing patient identified problems/goals with staff: Yes. Medical problems stabilized or resolved: Yes. Denies suicidal/homicidal ideation: Yes. Issues/concerns per patient self-inventory: No. Other: None   New problem(s) identified: No, Describe:  None identified    New Short Term/Long Term Goal(s): detox, elimination of symptoms of psychosis, medication management for mood stabilization; elimination of SI thoughts; development of comprehensive mental wellness/sobriety plan.   Patient Goals:   I couldn't take my medications so I started doing drugs and that became a problem  Discharge Plan or Barriers: CSW will assist with appropriate discharge planning   Reason for Continuation of Hospitalization: Medication stabilization Withdrawal symptoms  Estimated Length of Stay: 1 to 7 days   Last 3 [place] Suicide Severity Risk Score: Flowsheet Row Admission (Current) from 01/22/2024 in [facility] from 01/21/2024 in [facility] Emergency Department at [facility] ED from 06/21/2023 in [facility] Emergency Department at [facility]  C-SSRS RISK CATEGORY High Risk High Risk Error: Q3, 4, or 5 should not be populated when Q2 is No    Last PHQ 2/9 Scores:    06/08/2023    8:00 AM 06/07/2023    2:09 PM 06/05/2023    4:55 AM  Depression screen PHQ 2/9  Decreased Interest 2 2 2   Down, Depressed, Hopeless 2 2 2   PHQ - 2 Score 4 4 4   Altered sleeping 1 1 1   Tired, decreased energy 1 1 1   Change in appetite 1 1 1   Feeling bad or failure about yourself  3 3 3   Trouble concentrating 1 1 1   Moving slowly or fidgety/restless 0 0 0  Suicidal thoughts 1 1 1   PHQ-9 Score 12 12 12   Difficult doing work/chores   Very difficult    Scribe for Treatment Team: Lum JONETTA Croft, ISRA[place] 01/24/2024 2:53 PM

## 2024-01-24 NOTE — Progress Notes (Signed)
 [facility] MD Progress Note  01/24/2024 3:41 PM Torben Soloway  MRN:  [id number]  Richard Moon is a 63 y.o. male admitted: Presented to the EDfor 01/21/2024 11:09 PM for suicidal thoughts.  He is hearing a voice that tells him to harm himself.SABRA He carries the psychiatric diagnoses of MDD, stimulant use disorder, cocaine induced mood disorder and no pertinent medical history.   Subjective:  Chart reviewed, case discussed in multidisciplinary meeting, patient seen during rounds.  Patient met with the treatment team today.  He reports his goals of treatment being emotionally stable and not feeling the depression or anxiety.  He denies current suicidal/homicidal ideation/plan.  He denies any auditory/visual hallucinations.  He reports fair appetite and sleep.  He offers no complaints.  Per nursing staff patient is visible on the unit and is able to participate in the groups.   Sleep: Fair  Appetite:  Fair  Past Psychiatric History: see h&P Family History: History reviewed. No pertinent family history. Social History:  Social History   Substance and Sexual Activity  Alcohol Use Yes   Comment: Occasional alcohol use     Social History   Substance and Sexual Activity  Drug Use Yes   Types: Marijuana, Cocaine, Heroin   Comment: daily cocaine use (reported $200-300/ daily); occasional cannabis use    Social History   Socioeconomic History   Marital status: Single    Spouse name: Not on file   Number of children: Not on file   Years of education: Not on file   Highest education level: Not on file  Occupational History   Not on file  Tobacco Use   Smoking status: Some Days    Current packs/day: 0.25    Average packs/day: 0.3 packs/day for 20.0 years (5.0 ttl pk-yrs)    Types: Cigarettes   Smokeless tobacco: Never  Vaping Use   Vaping status: Every Day  Substance and Sexual Activity   Alcohol use: Yes    Comment: Occasional alcohol use   Drug use: Yes    Types: Marijuana, Cocaine,  Heroin    Comment: daily cocaine use (reported $200-300/ daily); occasional cannabis use   Sexual activity: Yes    Birth control/protection: Condom  Other Topics Concern   Not on file  Social History Narrative   Not on file   Social Drivers of Health   Financial Resource Strain: Not on file  Food Insecurity: Food Insecurity Present (01/22/2024)   Hunger Vital Sign    Worried About Running Out of Food in the Last Year: Sometimes true    Ran Out of Food in the Last Year: Sometimes true  Transportation Needs: Patient Unable To Answer (01/22/2024)   PRAPARE - Administrator, Civil Service (Medical): Patient unable to answer    Lack of Transportation (Non-Medical): Patient unable to answer  Physical Activity: Not on file  Stress: Not on file  Social Connections: Not on file   Past Medical History: History reviewed. No pertinent past medical history.  Past Surgical History:  Procedure Laterality Date   HERNIA REPAIR      Current Medications: Current Facility-Administered Medications  Medication Dose Route Frequency Provider Last Rate Last Admin   acetaminophen  (TYLENOL ) tablet 650 mg  650 mg Oral Q6H PRN Coleman, Carolyn H, NP       alum & mag hydroxide-simeth (MAALOX/MYLANTA) 200-200-20 MG/5ML suspension 30 mL  30 mL Oral Q4H PRN Coleman, Carolyn H, NP       hydrOXYzine  (ATARAX ) tablet 25 mg  25 mg Oral TID PRN Coleman, Carolyn H, NP       magnesium  hydroxide (MILK OF MAGNESIA) suspension 30 mL  30 mL Oral Daily PRN Coleman, Carolyn H, NP       OLANZapine  (ZYPREXA ) injection 5 mg  5 mg Intramuscular TID PRN Coleman, Carolyn H, NP       OLANZapine  zydis (ZYPREXA ) disintegrating tablet 5 mg  5 mg Oral TID PRN Mardy Elveria DEL, NP       sertraline  (ZOLOFT ) tablet 50 mg  50 mg Oral Daily Coleman, Carolyn H, NP   50 mg at 01/24/24 1057   traZODone  (DESYREL ) tablet 50 mg  50 mg Oral QHS Coleman, Carolyn H, NP   50 mg at 01/23/24 2237    Lab Results: No results found for this  or any previous visit (from the past 48 hours).  Blood Alcohol level:  Lab Results  Component Value Date   [facility] <15 01/21/2024   ETH <10 06/04/2023    Metabolic Disorder Labs: Lab Results  Component Value Date   HGBA1C 6.0 (H) 02/09/2023   MPG 125.5 02/09/2023   MPG 119.76 04/02/2021   No results found for: PROLACTIN Lab Results  Component Value Date   CHOL 174 02/09/2023   TRIG 148 02/09/2023   HDL 79 02/09/2023   CHOLHDL 2.2 02/09/2023   VLDL 30 02/09/2023   LDLCALC 65 02/09/2023   LDLCALC 99 04/02/2021    Physical Findings: AIMS:  , ,  ,  ,    CIWA:    COWS:      Psychiatric Specialty Exam:  Presentation  General Appearance:  Appropriate for Environment  Eye Contact: Good  Speech: Clear and Coherent  Speech Volume: Normal    Mood and Affect  Mood: Depressed  Affect: Congruent   Thought Process  Thought Processes: Coherent  Descriptions of Associations:Intact  Orientation:Full (Time, Place and Person)  Thought Content:Logical; WDL  Hallucinations: Denies Idea of Reference:None  Suicidal Thoughts: Denies Homicidal Thoughts: Denies  Sensorium  Memory: Remote Good  Judgment: Fair  Insight: Shallow   Executive Functions  Concentration: Good  Attention Span: Good  Recall: Good  Fund of Knowledge: Good  Language: Good   Psychomotor Activity  Psychomotor Activity: Denies Musculoskeletal: Strength & Muscle Tone: within normal limits Gait & Station: normal Assets  Assets: Manufacturing systems engineer; Desire for Improvement; Resilience    Physical Exam: Physical Exam ROS Blood pressure 105/77, pulse 63, temperature (!) 97.5 F (36.4 C), resp. rate 15, height 5' 9 (1.753 m), weight 70.8 kg, SpO2 98%. Body mass index is 23.04 kg/m.  Diagnosis: Principal Problem:   MDD (major depressive disorder), severe (HCC)   PLAN: Safety and Monitoring:  -- Voluntary admission to inpatient psychiatric unit for safety,  stabilization and treatment  -- Daily contact with patient to assess and evaluate symptoms and progress in treatment  -- Patient's case to be discussed in multi-disciplinary team meeting  -- Observation Level : q15 minute checks  -- Vital signs:  q12 hours  -- Precautions: suicide, elopement, and assault -- Encouraged patient to participate in unit milieu and in scheduled group therapies  2. Psychiatric Diagnoses and Treatment:  Zoloft  50 mg daily Trazodone  50 mg qhs       3. Medical Issues Being Addressed:     4. Discharge Planning:   -- Social work and case management to assist with discharge planning and identification of hospital follow-up needs prior to discharge  -- Estimated LOS: 3-4 days  Fatoumata Albaugh,  MD 01/24/2024, 3:41 PM

## 2024-01-24 NOTE — Plan of Care (Signed)
  Problem: Education: Goal: Ability to state activities that reduce stress will improve 01/24/2024 0745 by Ann Orlean LABOR, RN Outcome: Adequate for Discharge 01/24/2024 0745 by Ann Orlean LABOR, RN Outcome: Adequate for Discharge 01/24/2024 0726 by Ann Orlean LABOR, RN Outcome: Not Met (add Reason)   Problem: Education: Goal: Utilization of techniques to improve thought processes will improve 01/24/2024 0745 by Ann Orlean LABOR, RN Outcome: Adequate for Discharge 01/24/2024 0745 by Ann Orlean LABOR, RN Outcome: Adequate for Discharge 01/24/2024 0726 by Ann Orlean LABOR, RN Outcome: Not Met (add Reason) 01/24/2024 0723 by Ann Orlean LABOR, RN Outcome: Progressing Goal: Knowledge of the prescribed therapeutic regimen will improve 01/24/2024 0745 by Ann Orlean LABOR, RN Outcome: Adequate for Discharge 01/24/2024 0745 by Ann Orlean LABOR, RN Outcome: Adequate for Discharge 01/24/2024 0726 by Ann Orlean LABOR, RN Outcome: Not Met (add Reason) 01/24/2024 0723 by Ann Orlean LABOR, RN Outcome: Progressing

## 2024-01-24 NOTE — Plan of Care (Signed)
  Problem: Education: Goal: Utilization of techniques to improve thought processes will improve Outcome: Progressing Goal: Knowledge of the prescribed therapeutic regimen will improve Outcome: Progressing   Problem: Substance Use Goal: LTG: Othman will improve quality of life by maintaining ongoing abstinence from all mood-altering substances 01/24/2024 0725 by Ann Orlean LABOR, RN Outcome: Progressing 01/24/2024 0723 by Ann Orlean LABOR, RN Outcome: Progressing Goal: LTG: Tanda will increase coping skills to promote long-term recovery and improve ability to perform daily activities 01/24/2024 0725 by Ann Orlean LABOR, RN Outcome: Progressing 01/24/2024 0723 by Ann Orlean LABOR, RN Outcome: Progressing

## 2024-01-25 DIAGNOSIS — F322 Major depressive disorder, single episode, severe without psychotic features: Secondary | ICD-10-CM | POA: Diagnosis not present

## 2024-01-25 NOTE — Group Note (Signed)
 Date:  01/25/2024 Time:  10:51 AM  Group Topic/Focus:  Goals Group:   The focus of this group is to help patients establish daily goals to achieve during treatment and discuss how the patient can incorporate goal setting into their daily lives to aide in recovery.    Participation Level:  None  Richard Moon T Zulema 01/25/2024, 10:51 AM

## 2024-01-25 NOTE — Plan of Care (Signed)
  Problem: Education: Goal: Ability to state activities that reduce stress will improve Outcome: Progressing   Problem: Education: Goal: Utilization of techniques to improve thought processes will improve Outcome: Progressing Goal: Knowledge of the prescribed therapeutic regimen will improve Outcome: Progressing   Problem: Substance Use Goal: LTG: Serapio will increase coping skills to promote long-term recovery and improve ability to perform daily activities Outcome: Progressing

## 2024-01-25 NOTE — Progress Notes (Signed)
   01/25/24 1000  Psych Admission Type (Psych Patients Only)  Admission Status Voluntary  Psychosocial Assessment  Patient Complaints None  Eye Contact Brief  Facial Expression Flat  Affect Appropriate to circumstance  Speech Logical/coherent  Interaction Assertive  Motor Activity Slow  Appearance/Hygiene In scrubs  Behavior Characteristics Cooperative  Mood Pleasant  Thought Process  Coherency WDL  Content WDL  Delusions None reported or observed  Perception WDL  Hallucination None reported or observed  Judgment Impaired  Confusion WDL  Danger to Self  Current suicidal ideation? Denies  Agreement Not to Harm Self Yes  Description of Agreement verbal  Danger to Others  Danger to Others None reported or observed

## 2024-01-25 NOTE — Progress Notes (Signed)
 [facility] MD Progress Note  01/25/2024 9:39 PM Richard Moon  MRN:  [id number]  Richard Moon is a 63 y.o. male admitted: Presented to the EDfor 01/21/2024 11:09 PM for suicidal thoughts.  He is hearing a voice that tells him to harm himself.SABRA He carries the psychiatric diagnoses of MDD, stimulant use disorder, cocaine induced mood disorder and no pertinent medical history.   Subjective:  Chart reviewed, case discussed in multidisciplinary meeting, patient seen during rounds.  Patient is noted to be resting in bed.  He offers no complaints.  He denies SI/HI/plan and denies hallucinations.  She is unable to discuss clear goals on postdischarge.  Provider encouraged him to talk to the social worker on the resources he needs.  He is taking the medications with no reported side effects.  Sleep: Fair  Appetite:  Fair  Past Psychiatric History: see h&P Family History: History reviewed. No pertinent family history. Social History:  Social History   Substance and Sexual Activity  Alcohol Use Yes   Comment: Occasional alcohol use     Social History   Substance and Sexual Activity  Drug Use Yes   Types: Marijuana, Cocaine, Heroin   Comment: daily cocaine use (reported $200-300/ daily); occasional cannabis use    Social History   Socioeconomic History   Marital status: Single    Spouse name: Not on file   Number of children: Not on file   Years of education: Not on file   Highest education level: Not on file  Occupational History   Not on file  Tobacco Use   Smoking status: Some Days    Current packs/day: 0.25    Average packs/day: 0.3 packs/day for 20.0 years (5.0 ttl pk-yrs)    Types: Cigarettes   Smokeless tobacco: Never  Vaping Use   Vaping status: Every Day  Substance and Sexual Activity   Alcohol use: Yes    Comment: Occasional alcohol use   Drug use: Yes    Types: Marijuana, Cocaine, Heroin    Comment: daily cocaine use (reported $200-300/ daily); occasional cannabis use    Sexual activity: Yes    Birth control/protection: Condom  Other Topics Concern   Not on file  Social History Narrative   Not on file   Social Drivers of Health   Financial Resource Strain: Not on file  Food Insecurity: Food Insecurity Present (01/22/2024)   Hunger Vital Sign    Worried About Running Out of Food in the Last Year: Sometimes true    Ran Out of Food in the Last Year: Sometimes true  Transportation Needs: Patient Unable To Answer (01/22/2024)   PRAPARE - Administrator, Civil Service (Medical): Patient unable to answer    Lack of Transportation (Non-Medical): Patient unable to answer  Physical Activity: Not on file  Stress: Not on file  Social Connections: Not on file   Past Medical History: History reviewed. No pertinent past medical history.  Past Surgical History:  Procedure Laterality Date   HERNIA REPAIR      Current Medications: Current Facility-Administered Medications  Medication Dose Route Frequency Provider Last Rate Last Admin   acetaminophen  (TYLENOL ) tablet 650 mg  650 mg Oral Q6H PRN Coleman, Carolyn H, NP   650 mg at 01/24/24 1551   alum & mag hydroxide-simeth (MAALOX/MYLANTA) 200-200-20 MG/5ML suspension 30 mL  30 mL Oral Q4H PRN Coleman, Carolyn H, NP   30 mL at 01/25/24 1009   hydrOXYzine  (ATARAX ) tablet 25 mg  25 mg Oral TID  PRN Mardy Elveria DEL, NP       magnesium  hydroxide (MILK OF MAGNESIA) suspension 30 mL  30 mL Oral Daily PRN Coleman, Carolyn H, NP       OLANZapine  (ZYPREXA ) injection 5 mg  5 mg Intramuscular TID PRN Coleman, Carolyn H, NP       OLANZapine  zydis (ZYPREXA ) disintegrating tablet 5 mg  5 mg Oral TID PRN Coleman, Carolyn H, NP       sertraline  (ZOLOFT ) tablet 50 mg  50 mg Oral Daily Coleman, Carolyn H, NP   50 mg at 01/25/24 9075   traZODone  (DESYREL ) tablet 50 mg  50 mg Oral QHS Coleman, Carolyn H, NP   50 mg at 01/24/24 2040    Lab Results: No results found for this or any previous visit (from the past 48  hours).  Blood Alcohol level:  Lab Results  Component Value Date   [facility] <15 01/21/2024   ETH <10 06/04/2023    Metabolic Disorder Labs: Lab Results  Component Value Date   HGBA1C 6.0 (H) 02/09/2023   MPG 125.5 02/09/2023   MPG 119.76 04/02/2021   No results found for: PROLACTIN Lab Results  Component Value Date   CHOL 174 02/09/2023   TRIG 148 02/09/2023   HDL 79 02/09/2023   CHOLHDL 2.2 02/09/2023   VLDL 30 02/09/2023   LDLCALC 65 02/09/2023   LDLCALC 99 04/02/2021    Physical Findings: AIMS:  , ,  ,  ,    CIWA:    COWS:      Psychiatric Specialty Exam:  Presentation  General Appearance:  Appropriate for Environment  Eye Contact: Good  Speech: Clear and Coherent  Speech Volume: Normal    Mood and Affect  Mood: fine Affect: Congruent   Thought Process  Thought Processes: Coherent  Descriptions of Associations:Intact  Orientation:Full (Time, Place and Person)  Thought Content:Logical; WDL  Hallucinations: Denies Idea of Reference:None  Suicidal Thoughts: Denies Homicidal Thoughts: Denies  Sensorium  Memory: Remote Good  Judgment: Fair  Insight: Shallow   Executive Functions  Concentration: Good  Attention Span: Good  Recall: Good  Fund of Knowledge: Good  Language: Good   Psychomotor Activity  Psychomotor Activity: Denies Musculoskeletal: Strength & Muscle Tone: within normal limits Gait & Station: normal Assets  Assets: Manufacturing systems engineer; Desire for Improvement; Resilience    Physical Exam: Physical Exam Vitals and nursing note reviewed.    ROS Blood pressure 93/71, pulse 66, temperature (!) 97.5 F (36.4 C), resp. rate 19, height 5' 9 (1.753 m), weight 70.8 kg, SpO2 92%. Body mass index is 23.04 kg/m.  Diagnosis: Principal Problem:   MDD (major depressive disorder), severe (HCC)   PLAN: Safety and Monitoring:  -- Voluntary admission to inpatient psychiatric unit for safety,  stabilization and treatment  -- Daily contact with patient to assess and evaluate symptoms and progress in treatment  -- Patient's case to be discussed in multi-disciplinary team meeting  -- Observation Level : q15 minute checks  -- Vital signs:  q12 hours  -- Precautions: suicide, elopement, and assault -- Encouraged patient to participate in unit milieu and in scheduled group therapies  2. Psychiatric Diagnoses and Treatment:  Zoloft  50 mg daily Trazodone  50 mg qhs       3. Medical Issues Being Addressed:     4. Discharge Planning:   -- Social work and case management to assist with discharge planning and identification of hospital follow-up needs prior to discharge  -- Estimated LOS: 3-4 days  Allyn Foil, MD 01/25/2024, 9:39 PM

## 2024-01-25 NOTE — Group Note (Signed)
 Recreation Therapy Group Note   Group Topic:Other  Group Date: 01/25/2024 Start Time: 1100 End Time: 1120 Facilitators: Celestia Jeoffrey BRAVO, LRT, CTRS Location: Dayroom  Activity Description/Intervention: Therapeutic Drumming. Patients with peers and staff were given the opportunity to engage in a leader facilitated [facility] Group Empowerment Drumming Circle with staff from the [employer], in partnership with [employer]. Teaching laboratory technician and trained [employer], Norleen Mon leading with LRT observing and documenting intervention and pt response. This evidenced-based practice targets 7 areas of health and wellbeing in the human experience including: stress-reduction, exercise, self-expression, camaraderie/support, nurturing, spirituality, and music-making (leisure).    Goal Area(s) Addresses:  Patient will engage in pro-social way in music group.  Patient will follow directions of drum leader on the first prompt. Patient will demonstrate no behavioral issues during group.  Patient will identify if a reduction in stress level occurs as a result of participation in therapeutic drum circle.     Affect/Mood: N/A   Participation Level: Did not attend    Clinical Observations/Individualized Feedback: Patient did not attend group.   Plan: Continue to engage patient in RT group sessions 2-3x/week.   Jeoffrey BRAVO Celestia, LRT, CTRS 01/25/2024 2:01 PM

## 2024-01-25 NOTE — Group Note (Unsigned)
 [facility] LCSW Group Therapy Note   Group Date: 01/25/2024 Start Time: 1330 End Time: 1400   Type of Therapy/Topic:  Group Therapy:  Emotion Regulation  Participation Level:  {[facility] PARTICIPATION LEVEL:22264}   Mood:  Description of Group:    The purpose of this group is to assist patients in learning to regulate negative emotions and experience positive emotions. Patients will be guided to discuss ways in which they have been vulnerable to their negative emotions. These vulnerabilities will be juxtaposed with experiences of positive emotions or situations, and patients challenged to use positive emotions to combat negative ones. Special emphasis will be placed on coping with negative emotions in conflict situations, and patients will process healthy conflict resolution skills.  Therapeutic Goals: 1. Patient will identify two positive emotions or experiences to reflect on in order to balance out negative emotions:  2. Patient will label two or more emotions that they find the most difficult to experience:  3. Patient will be able to demonstrate positive conflict resolution skills through discussion or role plays:   Summary of Patient Progress:   ***    Therapeutic Modalities:   Cognitive Behavioral Therapy Feelings Identification Dialectical Behavioral Therapy   Lum JONETTA Croft, LCSWA

## 2024-01-25 NOTE — Progress Notes (Signed)
  Estimated Sleeping Duration (Last 24 Hours): 8.50-10.25 hours   (Sleep Hours) - 8.5-10.25 hrs (Any PRNs that were needed, meds refused, or side effects to meds)-  none noted (Any disturbances and when (visitation, over night)- none (Concerns raised by the patient)- Pt. Would like to shave this morning (01/25/24) (SI/HI/AVH)- Denied all   Patient remains in bed resting, rise and fall of chest noted.    01/25/24 0110  Psych Admission Type (Psych Patients Only)  Admission Status Voluntary  Psychosocial Assessment  Patient Complaints None  Eye Contact Brief  Facial Expression Animated  Affect Appropriate to circumstance  Speech Logical/coherent  Interaction Assertive  Motor Activity Slow  Appearance/Hygiene In scrubs  Behavior Characteristics Cooperative  Mood Pleasant  Thought Process  Coherency WDL  Content WDL  Delusions None reported or observed  Perception WDL  Hallucination None reported or observed  Judgment Impaired  Confusion WDL  Danger to Self  Current suicidal ideation? Denies  Agreement Not to Harm Self Yes  Description of Agreement verbal  Danger to Others  Danger to Others None reported or observed

## 2024-01-25 NOTE — Plan of Care (Signed)
   Problem: Safety: Goal: Ability to remain free from injury will improve Outcome: Progressing   Problem: Skin Integrity: Goal: Risk for impaired skin integrity will decrease Outcome: Progressing

## 2024-01-25 NOTE — Group Note (Signed)
 Recreation Therapy Group Note   Group Topic:Stress Management  Group Date: 01/25/2024 Start Time: 1500 End Time: 1540 Facilitators: Celestia Jeoffrey BRAVO, LRT, CTRS Location: Dayroom  Group Description: Meditation. LRT and patients discussed what they know about meditation and mindfulness. LRT played a Deep Breathing Meditation exercise script for patients to follow along to. LRT and patients discussed how meditation and deep breathing can be used as a coping skill post--discharge to help manage symptoms of stress.    Goal Area(s) Addressed: Patient will practice using relaxation technique. Patient will identify a new coping skill.  Patient will follow multistep directions to reduce anxiety and stress.   Affect/Mood: N/A   Participation Level: Did not attend    Clinical Observations/Individualized Feedback: Patient did not attend group.   Plan: Continue to engage patient in RT group sessions 2-3x/week.   Jeoffrey BRAVO Celestia, LRT, CTRS 01/25/2024 4:13 PM

## 2024-01-26 DIAGNOSIS — F322 Major depressive disorder, single episode, severe without psychotic features: Secondary | ICD-10-CM | POA: Diagnosis not present

## 2024-01-26 NOTE — Group Note (Unsigned)
 Date:  01/26/2024 Time:  3:58 PM  Group Topic/Focus:  Self Care:   The focus of this group is to help patients understand the importance of self-care in order to improve or restore emotional, physical, spiritual, interpersonal, and financial health.     Participation Level:  {[facility] PARTICIPATION OZCZO:77735}  Participation Quality:  {[facility] PARTICIPATION QUALITY:22265}  Affect:  {[facility] AFFECT:22266}  Cognitive:  {[facility] COGNITIVE:22267}  Insight: {[facility] Insight2:20797}  Engagement in Group:  {[facility] ENGAGEMENT IN HMNLE:77731}  Modes of Intervention:  {[facility] MODES OF INTERVENTION:22269}  Additional Comments:  ***  Harlene LITTIE Gavel 01/26/2024, 3:58 PM

## 2024-01-26 NOTE — Progress Notes (Deleted)
   01/26/24 2200  Psych Admission Type (Psych Patients Only)  Admission Status Voluntary  Psychosocial Assessment  Patient Complaints None  Eye Contact Fair  Facial Expression Animated  Affect Appropriate to circumstance  Speech Logical/coherent  Interaction Assertive  Motor Activity Other (Comment) (WNL)  Appearance/Hygiene In scrubs  Mood Pleasant  Thought Process  Coherency WDL  Content WDL  Delusions None reported or observed  Perception WDL  Hallucination None reported or observed  Judgment Impaired  Confusion None  Danger to Self  Current suicidal ideation? Denies  Agreement Not to Harm Self Yes  Description of Agreement Verbal  Danger to Others  Danger to Others None reported or observed

## 2024-01-26 NOTE — Plan of Care (Signed)
  Problem: Education: Goal: Utilization of techniques to improve thought processes will improve Outcome: Progressing Goal: Knowledge of the prescribed therapeutic regimen will improve Outcome: Progressing   Problem: Clinical Measurements: Goal: Ability to maintain clinical measurements within normal limits will improve Outcome: Progressing

## 2024-01-26 NOTE — Group Note (Signed)
 Physical/Occupational Therapy Group Note  Group Topic: Yoga  Group Date: 01/26/2024 Start Time: 1300 End Time: 1330 Facilitators: Cache Bills, Alm Hamilton, PT   Group Description: Group participated with series of yoga poses, designed to emphasize functional sitting balance, core stability, generalized flexibility and overall posture.  Incorporated deep breathing techniques with poses, working to promote relaxation, mindfulness and focus with targeted activities.   Discussed benefits of yoga in improving mood and self-esteem, reducing stress and anxiety, and promoting functional strength and balance for each participant.  Discussed ways to integrate into each participant's daily routine.  Provided handout with written and pictorial descriptions of included yoga movements to be utilized as appropriate outside of group time.  Therapeutic Goal(s):  Demonstrate safe ability to participate with yoga poses during group activity. Identify one benefit of participation with yoga poses as part of each participant's exercise/movement routine. Identify 1-2 individual poses that participant feels most beneficial to his/her needs and that he/she can easily replicate outside of group.  Individual Participation: Pt participated minimally with the discussion components of the session but actively with the physical activity components of the session.  Participation Level: Moderate   Participation Quality: Minimal Cues   Behavior: Appropriate   Speech/Thought Process: Minimal verbal communication during the session    Affect/Mood: Flat   Insight: Fair   Judgement: Fair   Modes of Intervention: Activity, Discussion, and Education  Patient Response to Interventions:  Attentive and Engaged   Plan: Continue to engage patient in PT/OT groups 1 - 2x/week.  CHARM Hamilton Bertin PT, DPT 01/26/24, 1:57 PM

## 2024-01-26 NOTE — Progress Notes (Signed)
   01/26/24 0948  Psych Admission Type (Psych Patients Only)  Admission Status Voluntary  Psychosocial Assessment  Patient Complaints Irritability  Eye Contact Brief  Facial Expression Flat  Affect Irritable  Speech Logical/coherent  Interaction Needy  Motor Activity Slow  Appearance/Hygiene In scrubs  Behavior Characteristics Cooperative  Mood Irritable  Thought Process  Coherency WDL  Content WDL  Delusions None reported or observed  Perception WDL  Hallucination None reported or observed  Judgment Impaired  Confusion None  Danger to Self  Current suicidal ideation? Denies  Agreement Not to Harm Self Yes  Description of Agreement Verbal  Danger to Others  Danger to Others None reported or observed

## 2024-01-26 NOTE — Progress Notes (Signed)
 Estimated Sleeping Duration (Last 24 Hours): 10.00-12.75 hours   (Sleep Hours) - 10-12.75 hrs (Any PRNs that were needed, meds refused, or side effects to meds)- none (Any disturbances and when (visitation, over night)- none (Concerns raised by the patient)- none (SI/HI/AVH)- Denied all.  Pt. Slept throughout the night without any issues / concerns. Pt. Remains in bed resting rise/fall of chest noted. All orders maintained as written.    01/26/24 0351  Psych Admission Type (Psych Patients Only)  Admission Status Voluntary  Psychosocial Assessment  Patient Complaints None  Eye Contact Brief  Facial Expression Flat  Affect Appropriate to circumstance  Speech Logical/coherent  Interaction Assertive  Motor Activity Slow  Appearance/Hygiene In scrubs  Behavior Characteristics Cooperative  Mood Pleasant  Thought Process  Coherency WDL  Content WDL  Delusions None reported or observed  Perception WDL  Hallucination None reported or observed  Judgment Impaired  Confusion WDL  Danger to Self  Current suicidal ideation? Denies  Agreement Not to Harm Self Yes  Description of Agreement verbal  Danger to Others  Danger to Others None reported or observed

## 2024-01-26 NOTE — Plan of Care (Signed)
  Problem: Education: Goal: Ability to state activities that reduce stress will improve Outcome: Progressing   Problem: Education: Goal: Utilization of techniques to improve thought processes will improve Outcome: Progressing   

## 2024-01-26 NOTE — Progress Notes (Signed)
   01/26/24 2200  Psych Admission Type (Psych Patients Only)  Admission Status Voluntary  Psychosocial Assessment  Patient Complaints None  Eye Contact Fair  Facial Expression Animated  Affect Appropriate to circumstance  Speech Logical/coherent  Interaction Assertive  Motor Activity Other (Comment) (WNL)  Appearance/Hygiene In scrubs  Behavior Characteristics Cooperative  Mood Pleasant  Aggressive Behavior  Effect No apparent injury  Thought Process  Coherency WDL  Content WDL  Delusions None reported or observed  Perception WDL  Hallucination None reported or observed  Judgment Impaired  Confusion None  Danger to Self  Current suicidal ideation? Denies  Agreement Not to Harm Self Yes  Description of Agreement Verbal  Danger to Others  Danger to Others None reported or observed

## 2024-01-26 NOTE — Progress Notes (Signed)
 [facility] MD Progress Note  01/26/2024 9:15 PM Richard Moon  MRN:  [id number]  Richard Moon is a 63 y.o. male admitted: Presented to the EDfor 01/21/2024 11:09 PM for suicidal thoughts.  He is hearing a voice that tells him to harm himself.SABRA He carries the psychiatric diagnoses of MDD, stimulant use disorder, cocaine induced mood disorder and no pertinent medical history.   Subjective:  Chart reviewed, case discussed in multidisciplinary meeting, patient seen during rounds. Today on interview patient reports doing well.  He reports that his discharge planning is to stay with his friend.  He reports that his friend has no car to come pick him up after discharge so he is requesting for transportation.  He reports improved mood, improved depression.  He is taking his medications with no reported side effects.  He is not endorsing SI/HI/plan and is not responding to internal stimuli.  Sleep: Fair  Appetite:  Fair  Past Psychiatric History: see h&P Family History: History reviewed. No pertinent family history. Social History:  Social History   Substance and Sexual Activity  Alcohol Use Yes   Comment: Occasional alcohol use     Social History   Substance and Sexual Activity  Drug Use Yes   Types: Marijuana, Cocaine, Heroin   Comment: daily cocaine use (reported $200-300/ daily); occasional cannabis use    Social History   Socioeconomic History   Marital status: Single    Spouse name: Not on file   Number of children: Not on file   Years of education: Not on file   Highest education level: Not on file  Occupational History   Not on file  Tobacco Use   Smoking status: Some Days    Current packs/day: 0.25    Average packs/day: 0.3 packs/day for 20.0 years (5.0 ttl pk-yrs)    Types: Cigarettes   Smokeless tobacco: Never  Vaping Use   Vaping status: Every Day  Substance and Sexual Activity   Alcohol use: Yes    Comment: Occasional alcohol use   Drug use: Yes    Types: Marijuana,  Cocaine, Heroin    Comment: daily cocaine use (reported $200-300/ daily); occasional cannabis use   Sexual activity: Yes    Birth control/protection: Condom  Other Topics Concern   Not on file  Social History Narrative   Not on file   Social Drivers of Health   Financial Resource Strain: Not on file  Food Insecurity: Food Insecurity Present (01/22/2024)   Hunger Vital Sign    Worried About Running Out of Food in the Last Year: Sometimes true    Ran Out of Food in the Last Year: Sometimes true  Transportation Needs: Patient Unable To Answer (01/22/2024)   PRAPARE - Administrator, Civil Service (Medical): Patient unable to answer    Lack of Transportation (Non-Medical): Patient unable to answer  Physical Activity: Not on file  Stress: Not on file  Social Connections: Not on file   Past Medical History: History reviewed. No pertinent past medical history.  Past Surgical History:  Procedure Laterality Date   HERNIA REPAIR      Current Medications: Current Facility-Administered Medications  Medication Dose Route Frequency Provider Last Rate Last Admin   acetaminophen  (TYLENOL ) tablet 650 mg  650 mg Oral Q6H PRN Coleman, Carolyn H, NP   650 mg at 01/24/24 1551   alum & mag hydroxide-simeth (MAALOX/MYLANTA) 200-200-20 MG/5ML suspension 30 mL  30 mL Oral Q4H PRN Mardy Elveria DEL, NP   30  mL at 01/25/24 1009   hydrOXYzine  (ATARAX ) tablet 25 mg  25 mg Oral TID PRN Coleman, Carolyn H, NP       magnesium  hydroxide (MILK OF MAGNESIA) suspension 30 mL  30 mL Oral Daily PRN Coleman, Carolyn H, NP       OLANZapine  (ZYPREXA ) injection 5 mg  5 mg Intramuscular TID PRN Coleman, Carolyn H, NP       OLANZapine  zydis (ZYPREXA ) disintegrating tablet 5 mg  5 mg Oral TID PRN Mardy Elveria DEL, NP       sertraline  (ZOLOFT ) tablet 50 mg  50 mg Oral Daily Coleman, Carolyn H, NP   50 mg at 01/26/24 9051   traZODone  (DESYREL ) tablet 50 mg  50 mg Oral QHS Coleman, Carolyn H, NP   50 mg at  01/25/24 2221    Lab Results: No results found for this or any previous visit (from the past 48 hours).  Blood Alcohol level:  Lab Results  Component Value Date   [facility] <15 01/21/2024   ETH <10 06/04/2023    Metabolic Disorder Labs: Lab Results  Component Value Date   HGBA1C 6.0 (H) 02/09/2023   MPG 125.5 02/09/2023   MPG 119.76 04/02/2021   No results found for: PROLACTIN Lab Results  Component Value Date   CHOL 174 02/09/2023   TRIG 148 02/09/2023   HDL 79 02/09/2023   CHOLHDL 2.2 02/09/2023   VLDL 30 02/09/2023   LDLCALC 65 02/09/2023   LDLCALC 99 04/02/2021    Physical Findings: AIMS:  , ,  ,  ,    CIWA:    COWS:      Psychiatric Specialty Exam:  Presentation  General Appearance:  Appropriate for Environment  Eye Contact: Good  Speech: Clear and Coherent  Speech Volume: Normal    Mood and Affect  Mood: fine Affect: Congruent   Thought Process  Thought Processes: Coherent  Descriptions of Associations:Intact  Orientation:Full (Time, Place and Person)  Thought Content:Logical; WDL  Hallucinations: Denies Idea of Reference:None  Suicidal Thoughts: Denies Homicidal Thoughts: Denies  Sensorium  Memory: Remote Good  Judgment: Fair  Insight: Shallow   Executive Functions  Concentration: Good  Attention Span: Good  Recall: Good  Fund of Knowledge: Good  Language: Good   Psychomotor Activity  Psychomotor Activity: Denies Musculoskeletal: Strength & Muscle Tone: within normal limits Gait & Station: normal Assets  Assets: Manufacturing systems engineer; Desire for Improvement; Resilience    Physical Exam: Physical Exam Vitals and nursing note reviewed.    ROS Blood pressure 119/79, pulse 71, temperature 98.1 F (36.7 C), resp. rate 14, height 5' 9 (1.753 m), weight 70.8 kg, SpO2 99%. Body mass index is 23.04 kg/m.  Diagnosis: Principal Problem:   MDD (major depressive disorder), severe  (HCC)   PLAN: Safety and Monitoring:  -- Voluntary admission to inpatient psychiatric unit for safety, stabilization and treatment  -- Daily contact with patient to assess and evaluate symptoms and progress in treatment  -- Patient's case to be discussed in multi-disciplinary team meeting  -- Observation Level : q15 minute checks  -- Vital signs:  q12 hours  -- Precautions: suicide, elopement, and assault -- Encouraged patient to participate in unit milieu and in scheduled group therapies  2. Psychiatric Diagnoses and Treatment:  Zoloft  50 mg daily Trazodone  50 mg qhs       3. Medical Issues Being Addressed:     4. Discharge Planning:   -- Social work and case management to assist with discharge planning  and identification of hospital follow-up needs prior to discharge  -- Estimated LOS: 3-4 days  Marqui Formby, MD 01/26/2024, 9:15 PM

## 2024-01-27 DIAGNOSIS — F322 Major depressive disorder, single episode, severe without psychotic features: Secondary | ICD-10-CM | POA: Diagnosis not present

## 2024-01-27 MED ORDER — SERTRALINE HCL 50 MG PO TABS: 50.0000 mg | ORAL_TABLET | Freq: Every day | ORAL | 0 refills | Status: DC

## 2024-01-27 MED ORDER — TRAZODONE HCL 50 MG PO TABS: 50.0000 mg | ORAL_TABLET | Freq: Every day | ORAL | 0 refills | Status: DC

## 2024-01-27 NOTE — Group Note (Signed)
 Date:  01/27/2024 Time:  4:19 AM  Group Topic/Focus:  Wrap-Up Group:   The focus of this group is to help patients review their daily goal of treatment and discuss progress on daily workbooks.    Participation Level:  Active  Participation Quality:  Appropriate  Affect:  Appropriate  Cognitive:  Appropriate  Insight: Appropriate  Engagement in Group:  Engaged  Modes of Intervention:  Discussion  Additional Comments:    Richard Moon 01/27/2024, 4:19 AM

## 2024-01-27 NOTE — Progress Notes (Signed)
 Patient is pleasant and cooperative.  Denies SI/HI and AVH.  Denies anxiety and depression.  Denies pain.  Compliant with scheduled medication. 15 min checks in place for safety.  Patient is present in the milieu.  Appropriate interaction with peers and staff.    Patient to discharge this afternoon. Suicide Safety Plan completed.

## 2024-01-27 NOTE — Discharge Summary (Signed)
 Physician Discharge Summary Note  Patient:  Donovin Kraemer is an 63 y.o., male MRN:  [id number] DOB:  1960/07/11 Patient phone:  [phone] (home)  Patient address:   [address] [place] [address],    Date of Admission:  01/22/2024 Date of Discharge: 01/27/24  Reason for Admission:  Windle Huebert is a 63 y.o. male admitted: Presented to the EDfor 01/21/2024 11:09 PM for suicidal thoughts. He is hearing a voice that tells him to harm himself.SABRA He carries the psychiatric diagnoses of MDD, stimulant use disorder, cocaine induced mood disorder and no pertinent medical history. Patient is admitted to [facility] unit with Q15 min safety monitoring. Multidisciplinary team approach is offered. Medication management; group/milieu therapy is offered.   Principal Problem: MDD (major depressive disorder), severe (HCC) Discharge Diagnoses: Principal Problem:   MDD (major depressive disorder), severe (HCC)   Past Psychiatric History: see h&p  Family Psychiatric  History: see h&p Social History:  Social History   Substance and Sexual Activity  Alcohol Use Yes   Comment: Occasional alcohol use     Social History   Substance and Sexual Activity  Drug Use Yes   Types: Marijuana, Cocaine, Heroin   Comment: daily cocaine use (reported $200-300/ daily); occasional cannabis use    Social History   Socioeconomic History   Marital status: Single    Spouse name: Not on file   Number of children: Not on file   Years of education: Not on file   Highest education level: Not on file  Occupational History   Not on file  Tobacco Use   Smoking status: Some Days    Current packs/day: 0.25    Average packs/day: 0.3 packs/day for 20.0 years (5.0 ttl pk-yrs)    Types: Cigarettes   Smokeless tobacco: Never  Vaping Use   Vaping status: Every Day  Substance and Sexual Activity   Alcohol use: Yes    Comment: Occasional alcohol use   Drug use: Yes    Types: Marijuana, Cocaine, Heroin     Comment: daily cocaine use (reported $200-300/ daily); occasional cannabis use   Sexual activity: Yes    Birth control/protection: Condom  Other Topics Concern   Not on file  Social History Narrative   Not on file   Social Drivers of Health   Financial Resource Strain: Not on file  Food Insecurity: Food Insecurity Present (01/22/2024)   Hunger Vital Sign    Worried About Running Out of Food in the Last Year: Sometimes true    Ran Out of Food in the Last Year: Sometimes true  Transportation Needs: Patient Unable To Answer (01/22/2024)   PRAPARE - Administrator, Civil Service (Medical): Patient unable to answer    Lack of Transportation (Non-Medical): Patient unable to answer  Physical Activity: Not on file  Stress: Not on file  Social Connections: Not on file   Past Medical History: History reviewed. No pertinent past medical history.  Past Surgical History:  Procedure Laterality Date   HERNIA REPAIR     Family History: History reviewed. No pertinent family history.  Hospital Course:  Ludwin Flahive is a 63 y.o. male admitted: Presented to the EDfor 01/21/2024 11:09 PM for suicidal thoughts. He is hearing a voice that tells him to harm himself.SABRA He carries the psychiatric diagnoses of MDD, stimulant use disorder, cocaine induced mood disorder and no pertinent medical history. Patient is admitted to [facility] unit with Q15 min safety monitoring. Multidisciplinary team approach is offered. Medication  management; group/milieu therapy is offered.  Detailed risk assessment is complete based on clinical exam and individual risk factors and acute suicide risk is low and acute violence risk is low.    On admission, patient was started on Zoloft  50 mg and trazodone  to help with the depression and insomnia.  Patient tolerated the medication very well.  During the stay his mood and depression improved significantly.  He participated in the groups.  On the day of discharge he consistently  denied SI/HI/plan and denied hallucinations. Currently, all modifiable risk of harm to self/harm to others have been addressed and patient is no longer appropriate for the acute inpatient setting and is able to continue treatment for mental health needs in the community with the supports as indicated below.  Patient is educated and verbalized understanding of discharge plan of care including medications, follow-up appointments, mental health resources and further crisis services in the community.  He is instructed to call 911 or present to the nearest emergency room should he experience any decompensation in mood, disturbance of bowel or return of suicidal/homicidal ideations.  Patient verbalizes understanding of this education and agrees to this plan of care  Physical Findings: AIMS:  , ,  ,  ,    CIWA:    COWS:        Psychiatric Specialty Exam:  Presentation  General Appearance:  Appropriate for Environment; Casual  Eye Contact: Fair  Speech: Clear and Coherent  Speech Volume: Normal    Mood and Affect  Mood: Euthymic  Affect: Appropriate   Thought Process  Thought Processes: Coherent  Descriptions of Associations:Intact  Orientation:Full (Time, Place and Person)  Thought Content:Logical  Hallucinations:Hallucinations: None  Ideas of Reference:None  Suicidal Thoughts:Suicidal Thoughts: No  Homicidal Thoughts:Homicidal Thoughts: No   Sensorium  Memory: Immediate Fair; Recent Fair; Remote Fair  Judgment: Fair  Insight: Fair   Art therapist  Concentration: Fair  Attention Span: Fair  Recall: [employer] of Knowledge: Fair  Language: Fair   Psychomotor Activity  Psychomotor Activity: Psychomotor Activity: Normal  Musculoskeletal: Strength & Muscle Tone: within normal limits Gait & Station: normal Assets  Assets: Manufacturing systems engineer; Housing   Sleep  Sleep: Sleep: Fair    Physical Exam: Physical Exam Vitals and  nursing note reviewed.    ROS Blood pressure 104/75, pulse 68, temperature 97.9 F (36.6 C), resp. rate 18, height 5' 9 (1.753 m), weight 70.8 kg, SpO2 97%. Body mass index is 23.04 kg/m.   Social History   Tobacco Use  Smoking Status Some Days   Current packs/day: 0.25   Average packs/day: 0.3 packs/day for 20.0 years (5.0 ttl pk-yrs)   Types: Cigarettes  Smokeless Tobacco Never   Tobacco Cessation:  N/A, patient does not currently use tobacco products   Blood Alcohol level:  Lab Results  Component Value Date   [facility] <15 01/21/2024   ETH <10 06/04/2023    Metabolic Disorder Labs:  Lab Results  Component Value Date   HGBA1C 6.0 (H) 02/09/2023   MPG 125.5 02/09/2023   MPG 119.76 04/02/2021   No results found for: PROLACTIN Lab Results  Component Value Date   CHOL 174 02/09/2023   TRIG 148 02/09/2023   HDL 79 02/09/2023   CHOLHDL 2.2 02/09/2023   VLDL 30 02/09/2023   LDLCALC 65 02/09/2023   LDLCALC 99 04/02/2021    See Psychiatric Specialty Exam and Suicide Risk Assessment completed by Attending Physician prior to discharge.  Discharge destination:  Home  Is patient  on multiple antipsychotic therapies at discharge:  No   Has Patient had three or more failed trials of antipsychotic monotherapy by history:  No  Recommended Plan for Multiple Antipsychotic Therapies: NA   Allergies as of 01/27/2024   No Known Allergies      Medication List     TAKE these medications      Indication  sertraline  50 MG tablet Commonly known as: ZOLOFT  Take 1 tablet (50 mg total) by mouth daily.    traZODone  50 MG tablet Commonly known as: DESYREL  Take 1 tablet (50 mg total) by mouth at bedtime. What changed:  when to take this reasons to take this          Follow-up recommendations:  Activity:  As tolerated    Signed: Kriya Westra, MD 01/27/2024, 10:24 AM

## 2024-01-27 NOTE — BHH Suicide Risk Assessment (Signed)
 [facility] Discharge Suicide Risk Assessment   Principal Problem: MDD (major depressive disorder), severe (HCC) Discharge Diagnoses: Principal Problem:   MDD (major depressive disorder), severe (HCC)   Total Time spent with patient: 30 minutes  Musculoskeletal: Strength & Muscle Tone: within normal limits Gait & Station: normal Patient leans: N/A  Psychiatric Specialty Exam  Presentation  General Appearance:  Appropriate for Environment; Casual  Eye Contact: Fair  Speech: Clear and Coherent  Speech Volume: Normal  Handedness: Right   Mood and Affect  Mood: Euthymic  Duration of Depression Symptoms: Greater than two weeks  Affect: Appropriate   Thought Process  Thought Processes: Coherent  Descriptions of Associations:Intact  Orientation:Full (Time, Place and Person)  Thought Content:Logical  History of Schizophrenia/Schizoaffective disorder:No  Duration of Psychotic Symptoms:No data recorded Hallucinations:Hallucinations: None  Ideas of Reference:None  Suicidal Thoughts:Suicidal Thoughts: No  Homicidal Thoughts:Homicidal Thoughts: No   Sensorium  Memory: Immediate Fair; Recent Fair; Remote Fair  Judgment: Fair  Insight: Fair   Art therapist  Concentration: Fair  Attention Span: Fair  Recall: [employer] of Knowledge: Fair  Language: Fair   Psychomotor Activity  Psychomotor Activity: Psychomotor Activity: Normal   Assets  Assets: Communication Skills; Housing   Sleep  Sleep: Sleep: Fair  Estimated Sleeping Duration (Last 24 Hours): 10.00-12.00 hours  Physical Exam: Physical Exam Vitals and nursing note reviewed.    ROS Blood pressure 104/75, pulse 68, temperature 97.9 F (36.6 C), resp. rate 18, height 5' 9 (1.753 m), weight 70.8 kg, SpO2 97%. Body mass index is 23.04 kg/m.  Mental Status Per Nursing Assessment::   On Admission:  NA  Demographic Factors:  Male  Loss Factors: Decrease in  vocational status  Historical Factors: NA  Risk Reduction Factors:   Living with another person, especially a relative, Positive social support, Positive therapeutic relationship, and Positive coping skills or problem solving skills  Continued Clinical Symptoms:  Depression:   Impulsivity  Cognitive Features That Contribute To Risk:  None    Suicide Risk:  Minimal: No identifiable suicidal ideation.  Patients presenting with no risk factors but with morbid ruminations; may be classified as minimal risk based on the severity of the depressive symptoms    Plan Of Care/Follow-up recommendations:  Activity:  As tolerated  Richard Moraes, MD 01/27/2024, 10:23 AM

## 2024-01-27 NOTE — Progress Notes (Signed)
  Discharge Note:  Patient denies SI/HI/AVH at this time.  Discharge instructions, AVS, prescriptions, and transition record gone reviewed with patient. Patient agrees to comply with medication management, follow-up visit, and outpatient therapy. Patient belongings returned to patient (cell phone).  Patient questions and concerns addressed and answered. Patient ambulatory off unit. Patient discharged to home via cab voucher.   Suicide Safety plan (copy)  given with discharge instructions.

## 2024-01-27 NOTE — Group Note (Unsigned)
 Date:  01/27/2024 Time:  11:15 AM  Group Topic/Focus:  Dimensions of Wellness:   The focus of this group is to introduce the topic of wellness and discuss the role each dimension of wellness plays in total health.     Participation Level:  {BHH PARTICIPATION OZCZO:77735}  Participation Quality:  {BHH PARTICIPATION QUALITY:22265}  Affect:  {BHH AFFECT:22266}  Cognitive:  {BHH COGNITIVE:22267}  Insight: {BHH Insight2:20797}  Engagement in Group:  {BHH ENGAGEMENT IN HMNLE:77731}  Modes of Intervention:  {BHH MODES OF INTERVENTION:22269}  Additional Comments:  ***  Memory Richard Moon 01/27/2024, 11:15 AM

## 2024-01-27 NOTE — Progress Notes (Signed)
  [facility] Adult Case Management Discharge Plan :  Will you be returning to the same living situation after discharge:  Yes,  pt will return with his friends  At discharge, do you have transportation home?: Yes,  CSW will assist with transportation  Do you have the ability to pay for your medications: Yes,  [facility]  Release of information consent forms completed and in the chart;  Patient's signature needed at discharge.  Patient to Follow up at:  Follow-up Information     [name] Follow up.   Why: Although you declined scheduled follow-up, [facility] accepts walk-in clients for their Behavioral Health Outpatient services until 3:00 PM daily, Monday through Friday Contact information: [address]  [address] [place] [address] [phone]                 Next level of care provider has access to [facility] Link:no  Safety Planning and Suicide Prevention discussed: Yes,  SPE gone over with pt      Has patient been referred to the Quitline?: Patient refused referral for treatment  Patient has been referred for addiction treatment: Patient refused referral for treatment; referral information given to patient at discharge.  [address], LCSWA 01/27/2024, 11:03 AM

## 2024-01-27 NOTE — Group Note (Signed)
 LCSW Group Therapy Note  Group Date: 01/27/2024 Start Time: 1315 End Time: 1400   Type of Therapy and Topic:  Group Therapy: Positive Affirmations  Participation Level:  Did Not Attend   Description of Group:   This group addressed positive affirmation towards self and others.  Patients went around the room and identified two positive things about themselves and two positive things about a peer in the room.  Patients reflected on how it felt to share something positive with others, to identify positive things about themselves, and to hear positive things from others/ Patients were encouraged to have a daily reflection of positive characteristics or circumstances.   Therapeutic Goals: Patients will verbalize two of their positive qualities Patients will demonstrate empathy for others by stating two positive qualities about a peer in the group Patients will verbalize their feelings when voicing positive self affirmations and when voicing positive affirmations of others Patients will discuss the potential positive impact on their wellness/recovery of focusing on positive traits of self and others.  Summary of Patient Progress: X  Therapeutic Modalities:   Cognitive Behavioral Therapy Motivational Interviewing    Lum JONETTA Croft, [place] 01/27/2024  2:14 PM

## 2024-01-27 NOTE — Plan of Care (Signed)
  Problem: Education: Goal: Ability to state activities that reduce stress will improve Outcome: Progressing   Problem: Education: Goal: Knowledge of the prescribed therapeutic regimen will improve Outcome: Progressing   Problem: Activity: Goal: Risk for activity intolerance will decrease Outcome: Progressing   Problem: Nutrition: Goal: Adequate nutrition will be maintained Outcome: Progressing

## 2024-01-27 NOTE — Plan of Care (Signed)
   Problem: Nutrition: Goal: Adequate nutrition will be maintained Outcome: Progressing   Problem: Coping: Goal: Level of anxiety will decrease Outcome: Progressing

## 2024-01-27 NOTE — Progress Notes (Signed)
(  Sleep Hours) - 9  (Any PRNs that were needed, meds refused, or side effects to meds)- no prns, no meds refused, no side effects to meds  (Any disturbances and when (visitation, over night)- N/A (Concerns raised by the patient)- N/A (SI/HI/AVH)- DENIES

## 2024-01-27 NOTE — Group Note (Signed)
 Recreation Therapy Group Note   Group Topic:Health and Wellness  Group Date: 01/27/2024 Start Time: 1100 End Time: 1135 Facilitators: Celestia Jeoffrey BRAVO, LRT, CTRS Location: Dayroom  Group Description: Seated Exercise. LRT discussed the mental and physical benefits of exercise. LRT and group discussed how physical activity can be used as a coping skill. Pt's and LRT followed along to an exercise video on the TV screen that provided a visual representation and audio description of every exercise performed. Pt's encouraged to listen to their bodies and stop at any time if they experience feelings of discomfort or pain. Pts were encouraged to drink water and stay hydrated.   Goal Area(s) Addressed: Patient will learn benefits of physical activity. Patient will identify exercise as a coping skill.  Patient will follow multistep directions. Patient will try a new leisure interest.    Affect/Mood: N/A   Participation Level: Did not attend    Clinical Observations/Individualized Feedback: Patient did not attend group.   Plan: Continue to engage patient in RT group sessions 2-3x/week.   Jeoffrey BRAVO Celestia, LRT, CTRS 01/27/2024 2:01 PM

## 2024-02-29 ENCOUNTER — Emergency Department (HOSPITAL_COMMUNITY)
Admission: EM | Admit: 2024-02-29 | Discharge: 2024-03-01 | Disposition: A | Discharge status: Other Acute Inpatient Hospital | Payer: Self-pay | Attending: Emergency Medicine | Admitting: Emergency Medicine

## 2024-02-29 ENCOUNTER — Encounter (HOSPITAL_COMMUNITY): Payer: Self-pay | Admitting: Emergency Medicine

## 2024-02-29 ENCOUNTER — Inpatient Hospital Stay: Admission: RE | Admit: 2024-02-29 | Payer: 59 | Source: Intra-hospital | Admitting: Psychiatry

## 2024-02-29 ENCOUNTER — Other Ambulatory Visit: Payer: Self-pay

## 2024-02-29 DIAGNOSIS — F1990 Other psychoactive substance use, unspecified, uncomplicated: Secondary | ICD-10-CM

## 2024-02-29 DIAGNOSIS — R45851 Suicidal ideations: Secondary | ICD-10-CM

## 2024-02-29 DIAGNOSIS — F339 Major depressive disorder, recurrent, unspecified: Secondary | ICD-10-CM

## 2024-02-29 DIAGNOSIS — F322 Major depressive disorder, single episode, severe without psychotic features: Secondary | ICD-10-CM | POA: Diagnosis present

## 2024-02-29 DIAGNOSIS — F1910 Other psychoactive substance abuse, uncomplicated: Secondary | ICD-10-CM | POA: Diagnosis present

## 2024-02-29 LAB — CBC
HCT: 48.4 % (ref 39.0–52.0)
Hemoglobin: 15.9 g/dL (ref 13.0–17.0)
MCH: 32.7 pg (ref 26.0–34.0)
MCHC: 32.9 g/dL (ref 30.0–36.0)
MCV: 99.6 fL (ref 80.0–100.0)
Platelets: 222 K/uL (ref 150–400)
RBC: 4.86 MIL/uL (ref 4.22–5.81)
RDW: 14.3 % (ref 11.5–15.5)
WBC: 5.5 K/uL (ref 4.0–10.5)
nRBC: 0 % (ref 0.0–0.2)

## 2024-02-29 LAB — RAPID URINE DRUG SCREEN, HOSP PERFORMED
Amphetamines: NOT DETECTED
Barbiturates: NOT DETECTED
Benzodiazepines: NOT DETECTED
Cocaine: POSITIVE — AB
Opiates: NOT DETECTED
Tetrahydrocannabinol: NOT DETECTED

## 2024-02-29 LAB — COMPREHENSIVE METABOLIC PANEL WITH GFR
ALT: 23 U/L (ref 0–44)
AST: 35 U/L (ref 15–41)
Albumin: 3.6 g/dL (ref 3.5–5.0)
Alkaline Phosphatase: 37 U/L — ABNORMAL LOW (ref 38–126)
Anion gap: 12 (ref 5–15)
BUN: 17 mg/dL (ref 8–23)
CO2: 23 mmol/L (ref 22–32)
Calcium: 9 mg/dL (ref 8.9–10.3)
Chloride: 105 mmol/L (ref 98–111)
Creatinine, Ser: 1.6 mg/dL — ABNORMAL HIGH (ref 0.61–1.24)
GFR, Estimated: 49 mL/min — ABNORMAL LOW (ref >60)
Glucose, Bld: 96 mg/dL (ref 70–99)
Potassium: 4.6 mmol/L (ref 3.5–5.1)
Sodium: 140 mmol/L (ref 135–145)
Total Bilirubin: 0.3 mg/dL (ref 0.0–1.2)
Total Protein: 5.7 g/dL — ABNORMAL LOW (ref 6.5–8.1)

## 2024-02-29 LAB — SARS CORONAVIRUS 2 BY RT PCR: SARS Coronavirus 2 by RT PCR: POSITIVE — AB

## 2024-02-29 LAB — ETHANOL: Alcohol, Ethyl (B): 43 mg/dL — ABNORMAL HIGH (ref <15)

## 2024-02-29 MED ORDER — SERTRALINE HCL 50 MG PO TABS
50.0000 mg | ORAL_TABLET | Freq: Every day | ORAL | Status: DC
Start: 2024-02-29 — End: 2024-03-01
  Administered 2024-02-29 – 2024-03-01 (×2): 50 mg via ORAL
  Filled 2024-02-29 (×2): qty 1

## 2024-02-29 MED ORDER — NICOTINE 21 MG/24HR TD PT24
21.0000 mg | MEDICATED_PATCH | Freq: Every day | TRANSDERMAL | Status: DC
  Filled 2024-02-29: qty 1

## 2024-02-29 MED ORDER — ONDANSETRON HCL 4 MG PO TABS: 4.0000 mg | ORAL_TABLET | Freq: Three times a day (TID) | ORAL | Status: DC | PRN

## 2024-02-29 MED ORDER — TRAZODONE HCL 50 MG PO TABS
50.0000 mg | ORAL_TABLET | Freq: Every day | ORAL | Status: DC
  Administered 2024-02-29: 50 mg via ORAL
  Filled 2024-02-29: qty 1

## 2024-02-29 MED ORDER — ACETAMINOPHEN 325 MG PO TABS
650.0000 mg | ORAL_TABLET | ORAL | Status: DC | PRN
  Administered 2024-03-01: 650 mg via ORAL
  Filled 2024-02-29: qty 2

## 2024-02-29 NOTE — Progress Notes (Deleted)
 Pt was accepted to [facility] 02/29/2024  Bed Assignment 29  Address: [address] [place], [place], [place] [address]  -[facility] Fax: [phone]  Pt meets inpatient criteria per: [name] NP  Attending Physician will be Dr. Allyn Donnelly COME  Report can be called to: [phone]  Pt can arrive after [facility] WILL UPDATE   Care Team notified: Cherylynn Ernst RN, [name] NP,     [place] Georgine Wiltse MSW, [facility] 02/29/2024 3:08 PM

## 2024-02-29 NOTE — Progress Notes (Signed)
 Inpatient Psychiatric Referral  Patient was recommended inpatient per [name], NP. There are no available beds at [facility], per [facility] . Patient was referred to the following out of network facilities:  Destination  Service Provider Address Phone Fax  [facility]  [address], [place] [address] [phone]  [facility]  [address] [place], [place] [address] [phone]  [facility]Ad[facility]  [address] [place], [place] [address] [phone]  [facility]  [address] [place] [address] [phone]  [facility]  [address] [place] [address] [phone]  [facility]  [address] [place] [address] [phone]  [facility] EFAX  [address] [place], [place] [phone]  [facility]  [address] [place] [address] [phone]  [facility] [address]  [address] [place] [address] [phone]  [facility]  [address] [place] [address] [phone]  [facility]  [address] [place], [place] [address] [phone]    Situation ongoing, CSW to continue following and update chart as more information becomes available.   Harrie Sofia MSW, LCSWA 02/29/2024  9:29 PM

## 2024-02-29 NOTE — Consult Note (Addendum)
 [facility] Psychiatric Consult Initial  Patient Name: .Richard Moon  MRN: [id number]  DOB: 16-Oct-1960  Consult Order details:  Orders (From admission, onward)     Start     Ordered   02/29/24 0902  CONSULT TO CALL ACT TEAM       Ordering Provider: Neldon Hamp RAMAN, PA  Provider:  (Not yet assigned)  Question:  Reason for Consult?  Answer:  SI   02/29/24 0901             Mode of Visit: In person    Psychiatry Consult Evaluation  Service Date: February 29, 2024 LOS:  LOS: 0 days  Chief Complaint I am tired of living  Primary Psychiatric Diagnoses  Major depressive disorder, severe 2.  Polysubstance use 3.  Suicidal ideations  Assessment  Richard Moon is a 63 y.o. male admitted: Presented to the EDfor 02/29/2024  2:52 AM for complaints of suicidal thoughts. He carries the psychiatric diagnoses of MDD, cocaine induced mood disorder, polysubstance abuse, and SI and no significant medical history  His current presentation of depression with suicidal thoughts with a plan to overdose is most consistent with major depressive disorder severe. He meets criteria for inpatient psychiatric admission based on current symptomology.  Patient currently takes no psychotropic medications.  He stopped taking medications once he left the psychiatric hospital and did not follow-up with any therapy services.   On initial examination, patient is observed laying in his bed awake.  He is pleasant upon approach.  He has normal speech and behavior.  Patient states he did well when he was in the psychiatric hospital and upon discharge he was offered residential treatment but last-minute he decided to move in with a friend instead of choosing the treatment option.  He regretted it immediately.  This ended up being a bad situation and everyone in the home does drugs.  It was difficult for him to maintain his sobriety he immediately relapsed.  He has no support in the area.  He has 1 daughter whom he does  not elaborate on.  He also had a son who died via suicide in 04/01/2022.  Patient becomes tearful when he is talking about his son.  He feels that his depression is never going to improve. States, I am tired of living and everything I touch turns into shit.  He has feelings of helplessness, hopelessness, guilt, worthlessness, tearfulness, decreased appetite and sleep.  He has a depressed affect.  He is currently suicidal with a plan to walk in front of a train.  He denies any homicidal ideations.  He denies auditory/visual hallucinations.  He does not appear to be manic, psychotic, paranoid, or delusional.  He does not appear to be responding to internal/external stimuli.    Discussed inpatient psychiatric admission and patient is in agreement.  He is interested in residential treatment upon discharge.  Please see plan below for detailed recommendations.   Diagnoses:  Active Hospital problems: Principal Problem:   MDD (major depressive disorder), severe (HCC) Active Problems:   Polysubstance abuse (HCC)    Plan   ## Psychiatric Medication Recommendations:  -Start sertraline  50 mg daily - Start trazodone  50 mg nightly - Start hydroxyzine  25 mg 3 times daily as needed for anxiety  ## Medical Decision Making Capacity: Not specifically addressed in this encounter  ## Further Work-up:  -- No further workup at this time  -- most recent EKG on 01/22/2024 had QtC of 418 -- Pertinent labwork reviewed earlier this admission  includes: CBC, CMP, glucose, BAL 43, UDS positive for cocaine   ## Disposition:-- We recommend inpatient psychiatric hospitalization when medically cleared. Patient is under voluntary admission status at this time; please IVC if attempts to leave hospital.  ## Behavioral / Environmental: -To minimize splitting of staff, assign one staff person to communicate all information from the team when feasible. or Utilize compassion and acknowledge the patient's experiences while setting  clear and realistic expectations for care.    ## Safety and Observation Level:  - Based on my clinical evaluation, I estimate the patient to be at low risk of self harm in the current setting. - At this time, we recommend  routine. This decision is based on my review of the chart including patient's history and current presentation, interview of the patient, mental status examination, and consideration of suicide risk including evaluating suicidal ideation, plan, intent, suicidal or self-harm behaviors, risk factors, and protective factors. This judgment is based on our ability to directly address suicide risk, implement suicide prevention strategies, and develop a safety plan while the patient is in the clinical setting. Please contact our team if there is a concern that risk level has changed.  CSSR Risk Category:C-SSRS RISK CATEGORY: High Risk  Suicide Risk Assessment: Patient has following modifiable risk factors for suicide: active suicidal ideation, untreated depression, recklessness, medication noncompliance, active mental illness (to encompass adhd, tbi, mania, psychosis, trauma reaction), current symptoms: anxiety/panic, insomnia, impulsivity, anhedonia, hopelessness, triggering events, and recent psychiatric hospitalization, which we are addressing by recommending inpatient psychiatric admission. Patient has following non-modifiable or demographic risk factors for suicide: male gender, history of suicide attempt, and psychiatric hospitalization Patient has the following protective factors against suicide: Access to outpatient mental health care  Thank you for this consult request. Recommendations have been communicated to the primary team.  We will continue to follow while awaiting psychiatric bed placement at this time.   Elveria VEAR Batter, NP       History of Present Illness  Relevant Aspects of Hospital ED Course:  Admitted on 02/29/2024 for or complaints of suicidal thoughts. He  carries the psychiatric diagnoses of MDD, cocaine induced mood disorder, polysubstance abuse, and SI and no significant medical history  Patient Report:  I am tired of living  [facility], Patient is a 63 year old male with a past medical history significant for suicidal ideations, MDD, cocaine induced mood disorder   Patient presents emergency room today with complaints of suicidal thoughts.  He does not seem to have a specific plan but states that he does not want to keep going/does not want to do this anymore.  It seems that he is currently living with a friend of his who uses cocaine regularly and this is caused him to relapse and using cocaine.  He endorses alcohol use but is not able to specify how often or how much he drinks.   He endorses feeling depressed he has no homicidal thoughts is not hallucinating.   Psych ROS:  Depression: Endorses Anxiety: Endorses Mania (lifetime and current): Denies Psychosis: (lifetime and current): Denies  Collateral information:  None contacted  Review of Systems  Constitutional:  Negative for chills and fever.  Respiratory:  Negative for cough and shortness of breath.   Cardiovascular:  Negative for chest pain.  Gastrointestinal:  Negative for abdominal pain, nausea and vomiting.  Musculoskeletal: Negative.   Neurological:  Negative for tremors.  Psychiatric/Behavioral:  Positive for substance abuse and suicidal ideas. The patient is nervous/anxious.  Psychiatric and Social History  Psychiatric History:  Information collected from chart review and patient  Prev Dx/Sx: MDD, polysubstance abuse including cocaine use disorder, reports anxiety and PTSD Current Psych Provider: None Home Meds (current): None Previous Med Trials: Has taken Seroquel  in the past and found it to be effective Therapy: None  Prior Psych Hospitalization: Yes most recent at [facility] 01/23/2024 Prior Self Harm: No Prior Violence: Denies  Family Psych  History: Depression Family Hx suicide: Son committed suicide 2023  Social History:  Developmental Hx: Denies Educational Hx: GED and some trade school 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: Denies any current legal charges Living Situation: Homeless Spiritual Hx: None Access to weapons/lethal means: Denies  Substance History Alcohol: Occasionally Type of alcohol beers Last drink  before admission Number of drinks per day couple at a time History of alcohol withdrawal seizures denies History of DT's denies Tobacco: 1 pack over 2 days Illicit drugs: Crack cocaine smokes 1-2 times per day daily last use yesterday Prescription drug abuse: Denies Rehab hx: Has completed [name] and caring services in the past-states he was set to go for residential treatment after his [facility] admission but at the last minute decided to go live with a friend.  Regrets this decision should have gone to treatment.  Exam Findings  Physical Exam:  Vital Signs:  Temp:  [98 F (36.7 C)-98.1 F (36.7 C)] 98.1 F (36.7 C) (09/09 0724) Pulse Rate:  [88] 88 (09/09 0724) Resp:  [17-18] 17 (09/09 0724) BP: (108-128)/(73-89) 108/73 (09/09 0724) SpO2:  [98 %] 98 % (09/09 0724) Blood pressure 108/73, pulse 88, temperature 98.1 F (36.7 C), resp. rate 17, SpO2 98%. There is no height or weight on file to calculate BMI.  Physical Exam Constitutional:      Appearance: Normal appearance.  Pulmonary:     Effort: Pulmonary effort is normal. No respiratory distress.  Musculoskeletal:        General: Normal range of motion.     Cervical back: Normal range of motion.  Neurological:     Mental Status: He is alert and oriented to person, place, and time.  Psychiatric:        Attention and Perception: Attention and perception normal.        Mood and Affect: Mood is anxious and depressed. Affect is tearful.        Speech: Speech normal.        Behavior: Behavior is cooperative.        Thought  Content: Thought content includes suicidal ideation. Thought content includes suicidal plan.        Cognition and Memory: Cognition normal.        Judgment: Judgment is impulsive.     Mental Status Exam: General Appearance: Casual  Orientation:  Full (Time, Place, and Person)  Memory:  Immediate;   Good Recent;   Good Remote;   Good  Concentration:  Concentration: Good and Attention Span: Good  Recall:  Good  Attention  Good  Eye Contact:  Good  Speech:  Clear and Coherent and Normal Rate  Language:  Good  Volume:  Normal  Mood: tired  Affect:  Depressed  Thought Process:  Coherent  Thought Content:  Logical  Suicidal Thoughts:  Yes.  with intent/plan  Homicidal Thoughts:  No  Judgement:  Impaired  Insight:  Lacking  Psychomotor Activity:  Normal  Akathisia:  No  Fund of Knowledge:  Good      Assets:  Communication Skills Desire for  Improvement Leisure Time Physical Health Resilience  Cognition:  WNL  ADL's:  Intact  AIMS (if indicated):        Other History   These have been pulled in through the EMR, reviewed, and updated if appropriate.  Family History:  The patient's family history is not on file.  Medical History: History reviewed. No pertinent past medical history.  Surgical History: Past Surgical History:  Procedure Laterality Date  . HERNIA REPAIR       Medications:   Current Facility-Administered Medications:  .  acetaminophen  (TYLENOL ) tablet 650 mg, 650 mg, Oral, Q4H PRN, Fondaw, Wylder S, PA .  nicotine  (NICODERM CQ  - dosed in mg/24 hours) patch 21 mg, 21 mg, Transdermal, Daily, Fondaw, Wylder S, PA .  ondansetron  (ZOFRAN ) tablet 4 mg, 4 mg, Oral, Q8H PRN, Fondaw, Wylder S, PA  Current Outpatient Medications:  .  sertraline  (ZOLOFT ) 50 MG tablet, Take 1 tablet (50 mg total) by mouth daily., Disp: 30 tablet, Rfl: 0 .  traZODone  (DESYREL ) 50 MG tablet, Take 1 tablet (50 mg total) by mouth at bedtime., Disp: 30 tablet, Rfl:  0  Allergies: No Known Allergies  Elveria VEAR Batter, NP

## 2024-02-29 NOTE — ED Provider Notes (Signed)
 [place] EMERGENCY DEPARTMENT AT [facility] Provider Note   CSN: [id number] Arrival date & time: 02/29/24  9757     Patient presents with: Suicidal   Richard Moon is a 63 y.o. male.   HPI  Patient is a 63 year old male with a past medical history significant for suicidal ideations, MDD, cocaine induced mood disorder  Patient presents emergency room today with complaints of suicidal thoughts.  He does not seem to have a specific plan but states that he does not want to keep going/does not want to do this anymore.  It seems that he is currently living with a friend of his who uses cocaine regularly and this is caused him to relapse and using cocaine.  He endorses alcohol use but is not able to specify how often or how much he drinks.  He endorses feeling depressed he has no homicidal thoughts is not hallucinating.     Prior to Admission medications   Medication Sig Start Date End Date Taking? Authorizing Provider  sertraline  (ZOLOFT ) 50 MG tablet Take 1 tablet (50 mg total) by mouth daily. 01/27/24   Donnelly Mellow, MD  traZODone  (DESYREL ) 50 MG tablet Take 1 tablet (50 mg total) by mouth at bedtime. 01/27/24   Donnelly Mellow, MD    Allergies: Patient has no known allergies.    Review of Systems  Updated Vital Signs BP 108/73   Pulse 88   Temp 98.1 F (36.7 C)   Resp 17   SpO2 98%   Physical Exam Vitals and nursing note reviewed.  Constitutional:      General: He is not in acute distress.    Appearance: Normal appearance. He is not ill-appearing.  HENT:     Head: Normocephalic and atraumatic.     Mouth/Throat:     Mouth: Mucous membranes are moist.  Eyes:     General: No scleral icterus.       Right eye: No discharge.        Left eye: No discharge.     Conjunctiva/sclera: Conjunctivae normal.  Cardiovascular:     Rate and Rhythm: Normal rate.  Pulmonary:     Effort: Pulmonary effort is normal.     Breath sounds: No stridor.  Abdominal:      Tenderness: There is no abdominal tenderness.  Skin:    General: Skin is warm and dry.  Neurological:     Mental Status: He is alert and oriented to person, place, and time. Mental status is at baseline.  Psychiatric:     Comments: Depressed mood Flat affect      (all labs ordered are listed, but only abnormal results are displayed) Labs Reviewed  COMPREHENSIVE METABOLIC PANEL WITH GFR - Abnormal; Notable for the following components:      Result Value   Creatinine, Ser 1.60 (*)    Total Protein 5.7 (*)    Alkaline Phosphatase 37 (*)    GFR, Estimated 49 (*)    All other components within normal limits  ETHANOL - Abnormal; Notable for the following components:   Alcohol, Ethyl (B) 43 (*)    All other components within normal limits  RAPID URINE DRUG SCREEN, HOSP PERFORMED - Abnormal; Notable for the following components:   Cocaine POSITIVE (*)    All other components within normal limits  CBC    EKG: None  Radiology: No results found.   Procedures   Medications Ordered in the ED  ondansetron  (ZOFRAN ) tablet 4 mg (has no administration in  time range)  nicotine  (NICODERM CQ  - dosed in mg/24 hours) patch 21 mg (21 mg Transdermal Not Given 02/29/24 0911)  acetaminophen  (TYLENOL ) tablet 650 mg (has no administration in time range)                                    Medical Decision Making Amount and/or Complexity of Data Reviewed Labs: ordered.  Risk OTC drugs. Prescription drug management.   Patient is a 63 year old male with a past medical history significant for suicidal ideations, MDD, cocaine induced mood disorder  Patient presents emergency room today with complaints of suicidal thoughts.  He does not seem to have a specific plan but states that he does not want to keep going/does not want to do this anymore.  It seems that he is currently living with a friend of his who uses cocaine regularly and this is caused him to relapse and using cocaine.  He endorses  alcohol use but is not able to specify how often or how much he drinks.  He endorses feeling depressed he has no homicidal thoughts is not hallucinating.  Given that patient is voluntary, endorsing suicidal thoughts and depressed feelings but is requesting treatment and evaluation and not under IVC I discussed case with Donia Snell who requested patient not be transferred to [facility]   Diet order placed, TTS consult ordered.  Patient is medically clear at this time and awaiting TTS evaluation.   Final diagnoses:  Suicidal thoughts    ED Discharge Orders     None          Richard Moon, Richard Moon 02/29/24 9083    Richard Jayson LABOR, DO 03/03/24 [phone]

## 2024-02-29 NOTE — ED Notes (Addendum)
 Voluntary consent signed by patient and this RN.  Faxed over to [phone].

## 2024-02-29 NOTE — ED Triage Notes (Signed)
 Pt comes to the ED staying he is tier of living. I don't have a home or a job, I don't want to be alive any more:

## 2024-02-29 NOTE — ED Notes (Signed)
 Psychiatry at bedside.  TTS.

## 2024-02-29 NOTE — Progress Notes (Signed)
 LCSW Progress Note  [id number]   Richard Moon  02/29/2024  3:15 PM  Description:   Inpatient Psychiatric Referral  Patient was recommended inpatient per  [name] (NP). There are no available beds at [facility], per [facility] Cherylynn Ernst RN. Patient was referred to the following out of network facilities:   Destination  Service Provider Address Phone Fax  [facility]  [address], [place] [address] [phone]  [facility]  [address] [place], [place] [address] [phone]  [facility]  [address] [place], [place] [address] [phone]  [facility]  [address] [place] [address] [phone]  [facility]  [address], [place] [address] [phone]  [facility]  [address], [place] [address] [phone]  [facility] EFAX  [address] [place], [place] [phone]  [facility]  [address] [place] [address] [phone]      Situation ongoing, CSW to continue following and update chart as more information becomes available.      Richard Moon, MSW, LCSW  02/29/2024 3:15 PM

## 2024-02-29 NOTE — ED Notes (Signed)
Breakfast tray ordered for the patient.  

## 2024-03-01 ENCOUNTER — Inpatient Hospital Stay (HOSPITAL_COMMUNITY)
Admission: AD | Admit: 2024-03-01 | Discharge: 2024-03-10 | DRG: 885 | Disposition: A | Discharge status: Home / Self Care | Source: Intra-hospital

## 2024-03-01 DIAGNOSIS — F17290 Nicotine dependence, other tobacco product, uncomplicated: Secondary | ICD-10-CM | POA: Diagnosis present

## 2024-03-01 DIAGNOSIS — G4700 Insomnia, unspecified: Secondary | ICD-10-CM | POA: Diagnosis present

## 2024-03-01 DIAGNOSIS — R45851 Suicidal ideations: Secondary | ICD-10-CM | POA: Diagnosis present

## 2024-03-01 DIAGNOSIS — Z5941 Food insecurity: Secondary | ICD-10-CM | POA: Diagnosis not present

## 2024-03-01 DIAGNOSIS — E559 Vitamin D deficiency, unspecified: Secondary | ICD-10-CM | POA: Diagnosis present

## 2024-03-01 DIAGNOSIS — F17210 Nicotine dependence, cigarettes, uncomplicated: Secondary | ICD-10-CM | POA: Diagnosis present

## 2024-03-01 DIAGNOSIS — Z79890 Hormone replacement therapy (postmenopausal): Secondary | ICD-10-CM | POA: Diagnosis not present

## 2024-03-01 DIAGNOSIS — U071 COVID-19: Secondary | ICD-10-CM | POA: Diagnosis present

## 2024-03-01 DIAGNOSIS — E039 Hypothyroidism, unspecified: Secondary | ICD-10-CM | POA: Diagnosis present

## 2024-03-01 DIAGNOSIS — Z5900 Homelessness unspecified: Secondary | ICD-10-CM | POA: Diagnosis not present

## 2024-03-01 DIAGNOSIS — Z79899 Other long term (current) drug therapy: Secondary | ICD-10-CM | POA: Diagnosis not present

## 2024-03-01 DIAGNOSIS — R7303 Prediabetes: Secondary | ICD-10-CM | POA: Diagnosis present

## 2024-03-01 DIAGNOSIS — F1424 Cocaine dependence with cocaine-induced mood disorder: Principal | ICD-10-CM | POA: Diagnosis present

## 2024-03-01 DIAGNOSIS — F322 Major depressive disorder, single episode, severe without psychotic features: Secondary | ICD-10-CM | POA: Diagnosis present

## 2024-03-01 HISTORY — DX: Major depressive disorder, recurrent severe without psychotic features: F33.2

## 2024-03-01 HISTORY — DX: Other psychoactive substance use, unspecified with psychoactive substance-induced mood disorder: F19.94

## 2024-03-01 HISTORY — DX: Homelessness unspecified: Z59.00

## 2024-03-01 HISTORY — DX: Cocaine abuse, uncomplicated: F14.10

## 2024-03-01 MED ORDER — NICOTINE 21 MG/24HR TD PT24
21.0000 mg | MEDICATED_PATCH | Freq: Every day | TRANSDERMAL | Status: DC
  Administered 2024-03-09 – 2024-03-10 (×2): 21 mg via TRANSDERMAL
  Filled 2024-03-01 (×3): qty 1

## 2024-03-01 MED ORDER — OLANZAPINE 10 MG IM SOLR: 5.0000 mg | Freq: Three times a day (TID) | INTRAMUSCULAR | Status: DC | PRN

## 2024-03-01 MED ORDER — ALUM & MAG HYDROXIDE-SIMETH 200-200-20 MG/5ML PO SUSP
30.0000 mL | ORAL | Status: DC | PRN
  Administered 2024-03-07: 30 mL via ORAL
  Filled 2024-03-01: qty 30

## 2024-03-01 MED ORDER — OLANZAPINE 5 MG PO TBDP: 5.0000 mg | ORAL_TABLET | Freq: Three times a day (TID) | ORAL | Status: DC | PRN

## 2024-03-01 MED ORDER — ACETAMINOPHEN 325 MG PO TABS
650.0000 mg | ORAL_TABLET | Freq: Four times a day (QID) | ORAL | Status: DC | PRN
  Filled 2024-03-01: qty 2

## 2024-03-01 MED ORDER — MAGNESIUM HYDROXIDE 400 MG/5ML PO SUSP: 30.0000 mL | Freq: Every day | ORAL | Status: DC | PRN

## 2024-03-01 MED ORDER — TRAZODONE HCL 50 MG PO TABS
50.0000 mg | ORAL_TABLET | Freq: Every day | ORAL | Status: DC
  Administered 2024-03-02 – 2024-03-09 (×8): 50 mg via ORAL
  Filled 2024-03-01 (×5): qty 1
  Filled 2024-03-01: qty 7
  Filled 2024-03-01 (×3): qty 1

## 2024-03-01 MED ORDER — HYDROXYZINE HCL 25 MG PO TABS
25.0000 mg | ORAL_TABLET | Freq: Three times a day (TID) | ORAL | Status: DC | PRN
  Administered 2024-03-09: 25 mg via ORAL
  Filled 2024-03-01: qty 1

## 2024-03-01 MED ORDER — SERTRALINE HCL 50 MG PO TABS
50.0000 mg | ORAL_TABLET | Freq: Every day | ORAL | Status: DC
  Administered 2024-03-02 – 2024-03-10 (×9): 50 mg via ORAL
  Filled 2024-03-01 (×2): qty 1
  Filled 2024-03-01: qty 7
  Filled 2024-03-01 (×7): qty 1

## 2024-03-01 NOTE — Progress Notes (Signed)
 Pt has been accepted to [facility] on 03/01/2024 Bed assignment: 307-01  Pt meets inpatient criteria per: [name] NP  Attending Physician will be: Dr. Raliegh MD  Report can be called un:lwpu:[place] unit: [phone]  Pt can arrive after TONIGHT AFTER SHIFT CHANGE  Care Team Notified: [facility] Cherylynn Ernst RN, Jerel Gravely NP  [place] Dorr Perrot LCSW-A   03/01/2024 12:56 PM

## 2024-03-01 NOTE — ED Provider Notes (Signed)
 Emergency Medicine Observation Re-evaluation Note  Richard Moon is a 63 y.o. male, seen on rounds today.  Pt initially presented to the ED for complaints of Suicidal Currently, the patient is resting comfortably.  Physical Exam  BP 100/73 (BP Location: Right Arm)   Pulse 67   Temp 98 F (36.7 C) (Oral)   Resp 17   SpO2 96%  Physical Exam  ED Course / MDM  EKG:   I have reviewed the labs performed to date as well as medications administered while in observation.  Recent changes in the last 24 hours include looking for placement.  Plan  Current plan is for placement by [facility].    Dasie Faden, MD 03/01/24 [phone]

## 2024-03-02 ENCOUNTER — Encounter (HOSPITAL_COMMUNITY): Payer: Self-pay | Admitting: Psychiatry

## 2024-03-02 ENCOUNTER — Other Ambulatory Visit: Payer: Self-pay

## 2024-03-02 DIAGNOSIS — F322 Major depressive disorder, single episode, severe without psychotic features: Principal | ICD-10-CM

## 2024-03-02 LAB — LIPID PANEL
Cholesterol: 179 mg/dL (ref 0–200)
HDL: 45 mg/dL (ref >40)
LDL Cholesterol: 100 mg/dL — ABNORMAL HIGH (ref 0–99)
Total CHOL/HDL Ratio: 4 ratio
Triglycerides: 168 mg/dL — ABNORMAL HIGH (ref <150)
VLDL: 34 mg/dL (ref 0–40)

## 2024-03-02 LAB — FOLATE: Folate: 11.3 ng/mL (ref >5.9)

## 2024-03-02 LAB — VITAMIN B12: Vitamin B-12: 533 pg/mL (ref 180–914)

## 2024-03-02 LAB — TSH: TSH: 0.316 u[IU]/mL — ABNORMAL LOW (ref 0.350–4.500)

## 2024-03-02 MED ORDER — CLONIDINE HCL 0.1 MG PO TABS
0.2000 mg | ORAL_TABLET | Freq: Once | ORAL | Status: AC
Start: 2024-03-02 — End: 2024-03-02
  Administered 2024-03-02: 0.2 mg via ORAL
  Filled 2024-03-02: qty 2

## 2024-03-02 NOTE — H&P (Signed)
 Psychiatric Admission Assessment Adult  Patient Identification: Richard Moon  MRN:  [id number]  Date of Evaluation:  03/02/24  Chief Complaint:  MDD (major depressive disorder), severe (HCC) [F32.2]   Principal Diagnosis: MDD (major depressive disorder), severe (HCC)  Diagnosis:  Principal Problem:   MDD (major depressive disorder), severe (HCC)    Chief Complaint: I am tired of living like this   History of Present Illness: Richard Moon is a 63 y.o. who  has a past medical history of Cocaine abuse (HCC), Homeless, Major depressive disorder, recurrent severe without psychotic features (HCC), and Substance induced mood disorder (HCC).  He presented to [facility] for MDD (major depressive disorder), severe (HCC).  He presented with suicidal ideation in the context of homelessness and drug abuse.  He had a similar presentation last month.  Patient is a poor historian.  On my exam the patient denies suicidal ideation today.  He reports he would like to get treatment for cocaine abuse as it is fueling depression.  He tells me that he has been homeless for about 3 months.  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  He tells me he has financial means to get a place, if he were not spending his money on crack.  The patient reports symptoms of major depressive disorder including depressed mood, anhedonia, insomnia, increased fatigue, feelings of hopelessness, helplessness, and worthlessness, feelings of guilt, decreased concentration, recurrent thoughts of death, and suicidal ideation.  Secondary gain cannot be excluded from the differential.    Past Psychiatric History: He  has a past medical history of Cocaine abuse (HCC), Homeless, Major depressive disorder, recurrent severe without psychotic features (HCC), and Substance induced mood disorder (HCC).   Is the patient at risk to self?  Yes Has the patient been a risk to self in the past 6 months? No Has the patient  been a risk to self within the distant past? No Is the patient a risk to others? No Has the patient been a risk to others in the past 6 months? No Has the patient been a risk to others within the distant past? No  [place] Scale:  Flowsheet Row [facility]) [facility] from 02/29/2024 in [facility] Admission (Discharged) from 01/22/2024 in [facility] BE[facility]  C-SSRS RISK CATEGORY High Risk High Risk High Risk       Prior Inpatient Therapy: Yes Prior Outpatient Therapy: Yes  Alcohol Screening:  1. How often do you have a drink containing alcohol?: Monthly or less 2. How many drinks containing alcohol do you have on a typical day when you are drinking?: 3 or 4 3. How often do you have six or more drinks on one occasion?: Never AUDIT-C Score: 2 4. How often during the last year have you found that you were not able to stop drinking once you had started?: Never 5. How often during the last year have you failed to do what was normally expected from you because of drinking?: Never 6. How often during the last year have you needed a first drink in the morning to get yourself going after a heavy drinking session?: Never 7. How often during the last year have you had a feeling of guilt of remorse after drinking?: Never 8. How often during the last year have you been unable to remember what happened the night before because you had been drinking?: Never  9. Have you or someone else been injured as a result of your drinking?: No 10. Has a relative or friend or a doctor or another health worker been concerned about your drinking or suggested you cut down?: No Alcohol Use Disorder Identification Test Final Score (AUDIT): 2  Substance Abuse History in the last 12 months: Yes Consequences of Substance Abuse: NA  Previous Psychotropic Medications: Yes Psychological Evaluations:  No  Past Medical History:  Past Medical History:  Diagnosis Date   Cocaine abuse (HCC)    Homeless    Major depressive disorder, recurrent severe without psychotic features (HCC)    Substance induced mood disorder [facility])      Family Psychiatric & Medical History:  History reviewed. No pertinent family history.   Tobacco Screening:  Social History   Tobacco Use  Smoking Status Some Days   Current packs/day: 0.25   Average packs/day: 0.3 packs/day for 20.0 years (5.0 ttl pk-yrs)   Types: Cigarettes  Smokeless Tobacco Never      Social History:  Social History   Substance and Sexual Activity  Alcohol Use Yes   Comment: Occasional alcohol use      Additional Social History: Marital status: Single Are you sexually active?: Yes What is your sexual orientation?: heterosexual Has your sexual activity been affected by drugs, alcohol, medication, or emotional stress?: None reported Does patient have children?: Yes How many children?: 1 How is patient's relationship with their children?: Patient reported having one living daughter and a son who died by way of suicide.     Allergies:  No Known Allergies   Lab Results:  No results found for this or any previous visit (from the past 48 hours).   Blood Alcohol level:  Lab Results  Component Value Date   ETH 43 (H) 02/29/2024   ETH <15 01/21/2024    Metabolic Disorder Labs:  Lab Results  Component Value Date   HGBA1C 6.0 (H) 02/09/2023   MPG 125.5 02/09/2023   MPG 119.76 04/02/2021   No results found for: PROLACTIN  Lab Results  Component Value Date   CHOL 174 02/09/2023   TRIG 148 02/09/2023   HDL 79 02/09/2023   VLDL 30 02/09/2023   LDLCALC 65 02/09/2023   LDLCALC 99 04/02/2021      Current Medications: Current Facility-Administered Medications  Medication Dose Route Frequency Provider Last Rate Last Admin   acetaminophen  (TYLENOL ) tablet 650 mg  650 mg Oral Q6H PRN Coleman, Carolyn H, NP        alum & mag hydroxide-simeth (MAALOX/MYLANTA) 200-200-20 MG/5ML suspension 30 mL  30 mL Oral Q4H PRN Coleman, Carolyn H, NP       hydrOXYzine  (ATARAX ) tablet 25 mg  25 mg Oral TID PRN Mardy Elveria DEL, NP       magnesium  hydroxide (MILK OF MAGNESIA) suspension 30 mL  30 mL Oral Daily PRN Mardy Elveria DEL, NP       nicotine  (NICODERM CQ  - dosed in mg/24 hours) patch 21 mg  21 mg Transdermal Daily Coleman, Carolyn H, NP       OLANZapine  (ZYPREXA ) injection 5 mg  5 mg Intramuscular TID PRN Coleman, Carolyn H, NP       OLANZapine  zydis (ZYPREXA ) disintegrating tablet 5 mg  5 mg Oral TID PRN Coleman, Carolyn H, NP       sertraline  (ZOLOFT ) tablet 50 mg  50 mg Oral Daily Coleman, Carolyn H, NP   50 mg at 03/02/24 0825   traZODone  (DESYREL ) tablet 50  mg  50 mg Oral QHS Coleman, Carolyn H, NP        PTA Medications: Medications Prior to Admission  Medication Sig Dispense Refill Last Dose/Taking   sertraline  (ZOLOFT ) 50 MG tablet Take 1 tablet (50 mg total) by mouth daily. 30 tablet 0    traZODone  (DESYREL ) 50 MG tablet Take 1 tablet (50 mg total) by mouth at bedtime. 30 tablet 0      Musculoskeletal: Strength & Muscle Tone: within normal limits Gait & Station: normal Patient leans: N/A    Psychiatric Specialty Exam:  Presentation  General Appearance: Disheveled  Eye Contact: Fair  Speech: Clear and Coherent  Speech Volume: Normal  Handedness: Right   Mood and Affect  Mood: Dysphoric; Anxious  Affect: Restricted; Congruent   Thought Process  Thought Processes: Linear  Descriptions of Associations: Intact  Orientation: Full (Time, Place and Person)  Thought Content: Logical  History of Schizophrenia/Schizoaffective disorder: No data recorded Duration of Psychotic Symptoms: NA Hallucinations: Hallucinations: None  Ideas of Reference: None  Suicidal Thoughts: Suicidal Thoughts: Yes, Active SI Active Intent and/or Plan: Without Plan  Homicidal Thoughts: Homicidal  Thoughts: No   Sensorium  Memory: Immediate Good; Recent Good  Judgment: Poor  Insight: Fair   Art therapist  Concentration: Good  Attention Span: Good  Recall: Good  Fund of Knowledge: Good  Language: Good   Psychomotor Activity  Psychomotor Activity: Psychomotor Activity: Psychomotor Retardation   Assets  Assets: Communication Skills; Resilience   Sleep  Sleep: Sleep: Poor    Physical Exam: General: Sitting comfortably. NAD. HEENT: Normocephalic, atraumatic, MMM, EMOI Lungs: no increased work of breathing noted Heart: no cyanosis Abdomen: Non distended Musculoskeletal: FROM. No obvious deformities Skin: Warm, dry, intact. No rashes noted Neuro: No obvious focal deficits.  Gait and station are normal  Review of Systems  Constitutional: Negative.   HENT: Negative.    Eyes: Negative.   Respiratory: Negative.    Cardiovascular: Negative.   Gastrointestinal: Negative.   Genitourinary: Negative.   Skin: Negative.   Neurological: Negative.   Psychiatric/Behavioral:  Positive for depression.     Blood pressure 95/63, pulse 69, temperature 98.1 F (36.7 C), temperature source Oral, resp. rate 18, height 5' 9 (1.753 m), weight 70.3 kg, SpO2 100%. Body mass index is 22.89 kg/m.   Treatment Plan Summary: ASSESSMENT: Richard Moon is an 63 y.o. male who  has a past medical history of Cocaine abuse (HCC), Homeless, Major depressive disorder, recurrent severe without psychotic features (HCC), and Substance induced mood disorder (HCC).  He presented on 03/01/2024 10:41 PM for MDD (major depressive disorder), severe (HCC).  He presented with suicidal ideation in the context of homelessness and crack abuse.  Of note, he is COVID-positive.  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) 02/08/2023   Acute on chronic respiratory  failure with hypoxia (HCC) 05/26/2022   Influenza A 05/26/2022   AKI (acute kidney injury) (HCC) 05/26/2022   Acute respiratory failure with hypoxemia (HCC) 05/26/2022   MDD (major depressive disorder) 12/04/2021   Substance induced mood disorder (HCC) 04/01/2021   Polysubstance abuse (HCC) 01/28/2021   Suicidal ideations 01/28/2021   Suicidal ideation    Cocaine-induced mood disorder (HCC)    Cocaine dependence with cocaine-induced mood disorder (HCC)    MDD (major depressive disorder), severe (HCC) 03/12/2018     PLAN: Safety and Monitoring:  -- Voluntary  admission to inpatient psychiatric unit for safety, stabilization and treatment  -- Daily contact with patient to assess and evaluate symptoms and progress in treatment  -- Patient's case to be discussed in multi-disciplinary team meeting  -- Observation Level : q15 minute checks  -- Vital signs:  q12 hours  -- Precautions: suicide, elopement, and assault  2. 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     Scheduled Medications:  nicotine   21 mg Transdermal Daily   sertraline   50 mg Oral Daily   traZODone   50 mg Oral QHS     As Needed Medications: acetaminophen , alum & mag hydroxide-simeth, hydrOXYzine , magnesium  hydroxide,  OLANZapine , OLANZapine  zydis    3. Medical Issues Being Addressed:    Labs reviewed, unremarkable     4. Discharge Planning:   -- Social work and case management to assist with discharge planning and identification of hospital follow-up needs prior to discharge  -- Estimated LOS: 5-7 days  -- Discharge Concerns: Need to establish a safety plan; Medication compliance and effectiveness  -- Discharge Goals: Return home with outpatient referrals for mental health follow-up including medication management/psychotherapy  5. Short Term Goals:  Improve ability to identify changes in lifestyle to reduce recurrence of condition, verbalize feelings, disclose and discuss suicidal ideas, demonstrate self-control, identify and develop effective coping behaviors, compliance with prescribed medications, identify triggers associated with substance abuse/mental health issues, participate in unit milieu and in scheduled group therapies   6. Long Term Goals: Improvement in symptoms so the patient is ready for discharge   --The risks/benefits/side-effects/alternatives to the medications above were discussed in detail with the patient and time was given for questions. The patient provided informed consent.   -- Metabolic profile and EKG monitoring obtained while on an atypical antipsychotic and listed in the EHR    Total Time Spent in Direct Patient Care:  I personally spent 60 minutes on the unit in direct patient care. The direct patient care time included face-to-face time with the patient, reviewing the patient's chart, communicating with other professionals, and coordinating care. Greater than 50% of this time was spent in counseling or coordinating care with the patient regarding goals of hospitalization, psycho-education, and discharge planning needs.   I certify that inpatient services furnished can reasonably be expected to improve the patient's condition.    Glendia Kitty, MD 03/02/2024, 1:41  PM      Portions of this note were created using voice recognition software. Minor syntax errors, grammatical content, spelling, or punctuation errors may have occurred unintentionally. Please notify the dino if the meaning of any statement is unclear.

## 2024-03-02 NOTE — Group Note (Signed)
 Date:  03/02/2024 Time:  8:56 PM  Group Topic/Focus:  Wrap-Up Group:   The focus of this group is to help patients review their daily goal of treatment and discuss progress on daily workbooks.    Participation Level:  Did Not Attend  Participation Quality:  N/A  Affect:  N/A  Cognitive:  N/A  Insight: None  Engagement in Group:  N/A  Modes of Intervention:  N/A  Additional Comments:  Patient could not attend due to being sick.  Eward Mace 03/02/2024, 8:56 PM
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
## 2024-03-02 NOTE — B[facility] Suicide Risk Assessment (Signed)
 [facility] Admission Suicide Risk Assessment   Nursing information obtained from:  Patient Demographic factors:  Male, Unemployed Current Mental Status:  Self-harm thoughts Loss Factors:  Loss of significant relationship, Decline in physical health, Financial problems / change in socioeconomic status Historical Factors:  NA Risk Reduction Factors:  Living with another person, especially a relative  Total Time spent with patient: 45 minutes Principal Problem: MDD (major depressive disorder), severe (HCC) Diagnosis:  Principal Problem:   MDD (major depressive disorder), severe (HCC)  Subjective Data: Richard Moon is a 63 y.o. male who has a past medical history of Cocaine abuse (HCC), Homeless, Major depressive disorder, recurrent severe without psychotic features (HCC), and Substance induced mood disorder (HCC). He presented from an outside hospital for MDD (major depressive disorder), severe (HCC) [F32.2].  He reported suicidal ideation with a plan to overdose.   Continued Clinical Symptoms:  Alcohol Use Disorder Identification Test Final Score (AUDIT): 2 The Alcohol Use Disorders Identification Test, Guidelines for Use in Primary Care, Second Edition.  World Science writer [facility]). Score between 0-7:  no or low risk or alcohol related problems. Score between 8-15:  moderate risk of alcohol related problems. Score between 16-19:  high risk of alcohol related problems. Score 20 or above:  warrants further diagnostic evaluation for alcohol dependence and treatment.   CLINICAL FACTORS:   Depression:   Anhedonia Comorbid alcohol abuse/dependence Hopelessness Insomnia Severe Alcohol/Substance Abuse/Dependencies More than one psychiatric diagnosis Unstable or Poor Therapeutic Relationship   Musculoskeletal: Strength & Muscle Tone: within normal limits Gait & Station: normal Patient leans: N/A  Psychiatric Specialty Exam:  Presentation  General Appearance:  Disheveled  Eye  Contact: Fair  Speech: Clear and Coherent  Speech Volume: Normal  Handedness: Right   Mood and Affect  Mood: Dysphoric; Anxious  Affect: Restricted; Congruent   Thought Process  Thought Processes: Linear  Descriptions of Associations:Intact  Orientation:Full (Time, Place and Person)  Thought Content:Logical  History of Schizophrenia/Schizoaffective disorder:No data recorded Duration of Psychotic Symptoms:No data recorded Hallucinations:Hallucinations: None  Ideas of Reference:None  Suicidal Thoughts:Suicidal Thoughts: Yes, Active SI Active Intent and/or Plan: Without Plan  Homicidal Thoughts:Homicidal Thoughts: No   Sensorium  Memory: Immediate Good; Recent Good  Judgment: Poor  Insight: Fair   Art therapist  Concentration: Good  Attention Span: Good  Recall: Good  Fund of Knowledge: Good  Language: Good   Psychomotor Activity  Psychomotor Activity: Psychomotor Activity: Psychomotor Retardation   Assets  Assets: Communication Skills; Resilience   Sleep  Sleep: Sleep: Poor    Physical Exam: Physical Exam ROS Blood pressure 95/63, pulse 69, temperature 98.1 F (36.7 C), temperature source Oral, resp. rate 18, height 5' 9 (1.753 m), weight 70.3 kg, SpO2 100%. Body mass index is 22.89 kg/m.   COGNITIVE FEATURES THAT CONTRIBUTE TO RISK:  None    SUICIDE RISK:   Moderate:  Frequent suicidal ideation with limited intensity, and duration, some specificity in terms of plans, no associated intent, good self-control, limited dysphoria/symptomatology, some risk factors present, and identifiable protective factors, including available and accessible social support.  PLAN OF CARE: See history and physical  I certify that inpatient services furnished can reasonably be expected to improve the patient's condition.   Starleen GORMAN Kitty, MD 03/02/2024, 1:23 PM

## 2024-03-02 NOTE — Plan of Care (Signed)
   Problem: Education: Goal: Knowledge of [place] [facility] Education information/materials will improve Outcome: Progressing Goal: Emotional status will improve Outcome: Progressing Goal: Mental status will improve Outcome: Progressing Goal: Verbalization of understanding the information provided will improve Outcome: Progressing

## 2024-03-02 NOTE — Progress Notes (Signed)
   03/02/24 0825  Psych Admission Type (Psych Patients Only)  Admission Status Voluntary  Psychosocial Assessment  Patient Complaints Depression  Eye Contact Fair  Facial Expression Animated  Affect Appropriate to circumstance  Speech Logical/coherent  Interaction Assertive  Motor Activity Other (Comment) (WDL)  Appearance/Hygiene Unremarkable  Behavior Characteristics Cooperative;Appropriate to situation  Mood Pleasant  Thought Process  Coherency WDL  Content WDL  Delusions None reported or observed  Perception WDL  Hallucination None reported or observed  Judgment Poor  Confusion None  Danger to Self  Current suicidal ideation? Denies  Agreement Not to Harm Self Yes  Description of Agreement Verbal  Danger to Others  Danger to Others None reported or observed

## 2024-03-02 NOTE — Group Note (Signed)
 LCSW Group Therapy Note   Group Date: 03/02/2024 Start Time: 1100 End Time: 1200   Participation:  did not attend (patient was sick)  Type of Therapy:  Group Therapy  Topic:  Lifestyle:  from "One Day" to "Today is Day One"  Objective:  To promote mental and physical well-being through lifestyle changes in routine, nutrition, sleep, and movement.  Goals: Increase awareness of how lifestyle habits impact mental health. Encourage one small, achievable wellness goal. Support group sharing and accountability.  Summary:  Group members explored how daily habits influence mental health and discussed the importance of starting with small, manageable changes. Participants identified personal goals and shared reflections on improving structure, sleep, diet, and physical activity.  Therapeutic Modalities: CBT - Identifying and challenging all-or-nothing thinking; promoting realistic, helpful thoughts about change. Psychoeducation - Teaching about the impact of sleep, nutrition, movement, and routine on mental health. Motivational Interviewing - Eliciting personal motivation and exploring readiness for change. Goal-Setting - Supporting SMART goals to build self-efficacy and encourage follow-through.   Richard Moon, LCSWA 03/02/2024  12:29 PM

## 2024-03-02 NOTE — Group Note (Signed)
 Date:  03/02/2024 Time:  5:21 PM  Group Topic/Focus:  Coping With Mental Health Crisis:   The purpose of this group is to help patients identify strategies for coping with mental health crisis.  Group discusses possible causes of crisis and ways to manage them effectively. Developing a Wellness Toolbox:   The focus of this group is to help patients develop a wellness toolbox with skills and strategies to promote recovery upon discharge. Wellness Toolbox:   The focus of this group is to discuss various aspects of wellness, balancing those aspects and exploring ways to increase the ability to experience wellness.  Patients will create a wellness toolbox for use upon discharge.    Participation Level:  Did Not Attend  Participation Quality:    Affect:    Cognitive:    Insight:   Engagement in Group:    Modes of Intervention:    Additional Comments:    Richard Moon [place] 03/02/2024, 5:21 PM

## 2024-03-02 NOTE — Plan of Care (Signed)
   Problem: Education: Goal: Knowledge of [place] [facility] Education information/materials will improve Outcome: Progressing Goal: Emotional status will improve Outcome: Progressing Goal: Mental status will improve Outcome: Progressing

## 2024-03-02 NOTE — Plan of Care (Signed)
   Problem: Health Behavior/Discharge Planning: Goal: Compliance with treatment plan for underlying cause of condition will improve Outcome: Progressing   Problem: Safety: Goal: Periods of time without injury will increase Outcome: Progressing

## 2024-03-02 NOTE — Progress Notes (Signed)
(  Sleep Hours) -6.0 (Any PRNs that were needed, meds refused, or side effects to meds)- none (Any disturbances and when (visitation, over night)-none (Concerns raised by the patient)- wants to be retested for COVID (SI/HI/AVH)- Passive, no plan
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
## 2024-03-03 ENCOUNTER — Encounter (HOSPITAL_COMMUNITY): Payer: Self-pay

## 2024-03-03 LAB — RPR: RPR Ser Ql: NONREACTIVE

## 2024-03-03 LAB — HEMOGLOBIN A1C
Hgb A1c MFr Bld: 5.9 % — ABNORMAL HIGH (ref 4.8–5.6)
Mean Plasma Glucose: 122.63 mg/dL

## 2024-03-03 LAB — T4, FREE: Free T4: 0.58 ng/dL — ABNORMAL LOW (ref 0.61–1.12)

## 2024-03-03 LAB — HIV ANTIBODY (ROUTINE TESTING W REFLEX): HIV Screen 4th Generation wRfx: NONREACTIVE

## 2024-03-03 LAB — VITAMIN D 25 HYDROXY (VIT D DEFICIENCY, FRACTURES): Vit D, 25-Hydroxy: 25.23 ng/mL — ABNORMAL LOW (ref 30–100)

## 2024-03-03 MED ORDER — VITAMIN D (ERGOCALCIFEROL) 1.25 MG (50000 UNIT) PO CAPS
50000.0000 [IU] | ORAL_CAPSULE | Freq: Every day | ORAL | Status: DC
Start: 2024-03-03 — End: 2024-03-10
  Administered 2024-03-03 – 2024-03-10 (×8): 50000 [IU] via ORAL
  Filled 2024-03-03 (×8): qty 1

## 2024-03-03 NOTE — Group Note (Signed)
 Recreation Therapy Group Note   Group Topic:Leisure Education  Group Date: 03/03/2024 Start Time: 0935 End Time: 1011 Facilitators: Tija Biss-McCall, LRT,CTRS Location: [address] Dayroom   Group Topic: Leisure Education   Goal Area(s) Addresses:  Patient will successfully demonstrate knowledge of leisure and recreation interests. Patient will successfully identify benefits of leisure participation.  Patient will verbalize appropriate recreation activities to use post discharge.   Behavioral Response:    Intervention: Guess the Lyric   Activity: LRT facilitated a competitive group game that had patients guess the missing lyric to songs presented. Patients had 6 categories (Pop, Rock, R&B, Dance, Indie and Hip Hop) to choose from. Patient would spin the flicker and whatever category the spinner landed on, the patient would be read a line from that song. If they had the correct answer, they kept the card. If they made the wrong answer, everyone else got a chance to steal the point. The person with the most cards at the end, was the winner.   Education:  Leisure Education, Publishing copy Outcome: Acknowledges education   Affect/Mood: N/A   Participation Level: Did not attend    Clinical Observations/Individualized Feedback:      Plan: Continue to engage patient in RT group sessions 2-3x/week.   Carol Loftin-McCall, LRT,CTRS 03/03/2024 12:43 PM

## 2024-03-03 NOTE — Group Note (Signed)
 Date:  03/03/2024 Time:  4:33 PM  Group Topic/Focus:  Overcoming Stress:   The focus of this group is to define stress and help patients assess their triggers.    Participation Level:  Did Not Attend   Annalee Larch 03/03/2024, 4:33 PM

## 2024-03-03 NOTE — Plan of Care (Signed)
  Problem: Activity: Goal: Sleeping patterns will improve Outcome: Progressing   Problem: Health Behavior/Discharge Planning: Goal: Compliance with treatment plan for underlying cause of condition will improve Outcome: Progressing   Problem: Safety: Goal: Periods of time without injury will increase Outcome: Progressing   

## 2024-03-03 NOTE — Progress Notes (Signed)
   03/03/24 0757  Psych Admission Type (Psych Patients Only)  Admission Status Voluntary  Psychosocial Assessment  Patient Complaints None  Eye Contact Fair  Facial Expression Animated  Affect Appropriate to circumstance  Speech Logical/coherent  Interaction Assertive  Motor Activity Other (Comment) (WDL)  Appearance/Hygiene Unremarkable  Behavior Characteristics Cooperative;Appropriate to situation  Mood Pleasant  Thought Process  Coherency WDL  Content WDL  Delusions None reported or observed  Perception WDL  Hallucination None reported or observed  Judgment Poor  Confusion None  Danger to Self  Current suicidal ideation? Denies  Agreement Not to Harm Self Yes  Description of Agreement Verbal  Danger to Others  Danger to Others None reported or observed

## 2024-03-03 NOTE — Progress Notes (Signed)
 Pt did not attend goals group.

## 2024-03-03 NOTE — BH IP Treatment Plan (Signed)
 Interdisciplinary Treatment and Diagnostic Plan Update  03/03/2024 Time of Session: 10:45 AM Richard Moon MRN: [id number]  Principal Diagnosis: MDD (major depressive disorder), severe (HCC)  Secondary Diagnoses: Principal Problem:   MDD (major depressive disorder), severe (HCC)   Current Medications:  Current Facility-Administered Medications  Medication Dose Route Frequency Provider Last Rate Last Admin   acetaminophen  (TYLENOL ) tablet 650 mg  650 mg Oral Q6H PRN Coleman, Carolyn H, NP       alum & mag hydroxide-simeth (MAALOX/MYLANTA) 200-200-20 MG/5ML suspension 30 mL  30 mL Oral Q4H PRN Coleman, Carolyn H, NP       hydrOXYzine  (ATARAX ) tablet 25 mg  25 mg Oral TID PRN Mardy Elveria DEL, NP       magnesium  hydroxide (MILK OF MAGNESIA) suspension 30 mL  30 mL Oral Daily PRN Coleman, Carolyn H, NP       nicotine  (NICODERM CQ  - dosed in mg/24 hours) patch 21 mg  21 mg Transdermal Daily Coleman, Carolyn H, NP       OLANZapine  (ZYPREXA ) injection 5 mg  5 mg Intramuscular TID PRN Coleman, Carolyn H, NP       OLANZapine  zydis (ZYPREXA ) disintegrating tablet 5 mg  5 mg Oral TID PRN Mardy Elveria DEL, NP       sertraline  (ZOLOFT ) tablet 50 mg  50 mg Oral Daily Coleman, Carolyn H, NP   50 mg at 03/03/24 0757   traZODone  (DESYREL ) tablet 50 mg  50 mg Oral QHS Coleman, Carolyn H, NP   50 mg at 03/02/24 2122   Vitamin D  (Ergocalciferol ) (DRISDOL ) 1.25 MG (50000 UNIT) capsule 50,000 Units  50,000 Units Oral Daily Parker, Alvin S, MD   50,000 Units at 03/03/24 0758   PTA Medications: Medications Prior to Admission  Medication Sig Dispense Refill Last Dose/Taking   sertraline  (ZOLOFT ) 50 MG tablet Take 1 tablet (50 mg total) by mouth daily. 30 tablet 0    traZODone  (DESYREL ) 50 MG tablet Take 1 tablet (50 mg total) by mouth at bedtime. 30 tablet 0     Patient Stressors:    Patient Strengths:    Treatment Modalities: Medication Management, Group therapy, Case management,  1 to 1 session  with clinician, Psychoeducation, Recreational therapy.   Physician Treatment Plan for Primary Diagnosis: MDD (major depressive disorder), severe (HCC) Long Term Goal(s):     Short Term Goals:    Medication Management: Evaluate patient's response, side effects, and tolerance of medication regimen.  Therapeutic Interventions: 1 to 1 sessions, Unit Group sessions and Medication administration.  Evaluation of Outcomes: Not Progressing  Physician Treatment Plan for Secondary Diagnosis: Principal Problem:   MDD (major depressive disorder), severe (HCC)  Long Term Goal(s):     Short Term Goals:       Medication Management: Evaluate patient's response, side effects, and tolerance of medication regimen.  Therapeutic Interventions: 1 to 1 sessions, Unit Group sessions and Medication administration.  Evaluation of Outcomes: Not Progressing   RN Treatment Plan for Primary Diagnosis: MDD (major depressive disorder), severe (HCC) Long Term Goal(s): Knowledge of disease and therapeutic regimen to maintain health will improve  Short Term Goals: Ability to remain free from injury will improve, Ability to verbalize frustration and anger appropriately will improve, Ability to demonstrate self-control, Ability to participate in decision making will improve, Ability to verbalize feelings will improve, Ability to disclose and discuss suicidal ideas, Ability to identify and develop effective coping behaviors will improve, and Compliance with prescribed medications will improve  Medication Management: RN  will administer medications as ordered by provider, will assess and evaluate patient's response and provide education to patient for prescribed medication. RN will report any adverse and/or side effects to prescribing provider.  Therapeutic Interventions: 1 on 1 counseling sessions, Psychoeducation, Medication administration, Evaluate responses to treatment, Monitor vital signs and CBGs as ordered,  Perform/monitor CIWA, COWS, AIMS and Fall Risk screenings as ordered, Perform wound care treatments as ordered.  Evaluation of Outcomes: Not Progressing   LCSW Treatment Plan for Primary Diagnosis: MDD (major depressive disorder), severe (HCC) Long Term Goal(s): Safe transition to appropriate next level of care at discharge, Engage patient in therapeutic group addressing interpersonal concerns.  Short Term Goals: Engage patient in aftercare planning with referrals and resources, Increase social support, Increase ability to appropriately verbalize feelings, Increase emotional regulation, Facilitate acceptance of mental health diagnosis and concerns, Facilitate patient progression through stages of change regarding substance use diagnoses and concerns, Identify triggers associated with mental health/substance abuse issues, and Increase skills for wellness and recovery  Therapeutic Interventions: Assess for all discharge needs, 1 to 1 time with Social worker, Explore available resources and support systems, Assess for adequacy in community support network, Educate family and significant other(s) on suicide prevention, Complete Psychosocial Assessment, Interpersonal group therapy.  Evaluation of Outcomes: Not Progressing   Progress in Treatment: Attending groups: No.   Participating in groups: No. Taking medication as prescribed: Yes. Toleration medication: Yes. Family/Significant other contact made: patient declined to provide consents Patient understands diagnosis: Yes. Discussing patient identified problems/goals with staff: Yes. Medical problems stabilized or resolved: Yes. Denies suicidal/homicidal ideation: Yes. Issues/concerns per patient self-inventory: No.  New problem(s) identified:  No  New Short Term/Long Term Goal(s):   medication stabilization, elimination of SI thoughts, development of comprehensive mental wellness plan.    Patient Goals:  I want to go to a drug treatment  program to get the help I need.   Discharge Plan or Barriers:  Patient recently admitted. CSW will continue to follow and assess for appropriate referrals and possible discharge planning.    Reason for Continuation of Hospitalization: Hallucinations Medication stabilization Suicidal ideation  Estimated Length of Stay:  5 - 7 days  Last 3 [place] Suicide Severity Risk Score: Flowsheet Row [facility]29/2024 [facility] Admission (Discharged) from 01/22/2024 in [facility] BE[facility]  C-SSRS RISK CATEGORY High Risk High Risk High Risk    Last PHQ 2/9 Scores:    06/08/2023    8:00 AM 06/07/2023    2:09 PM 06/05/2023    4:55 AM  Depression screen PHQ 2/9  Decreased Interest 2 2 2   Down, Depressed, Hopeless 2 2 2   PHQ - 2 Score 4 4 4   Altered sleeping 1 1 1   Tired, decreased energy 1 1 1   Change in appetite 1 1 1   Feeling bad or failure about yourself  3 3 3   Trouble concentrating 1 1 1   Moving slowly or fidgety/restless 0 0 0  Suicidal thoughts 1 1 1   PHQ-9 Score 12 12 12   Difficult doing work/chores   Very difficult    Scribe for Treatment Team: Shukri Nistler O Janiel Crisostomo, LCSWA 03/03/2024 12:53 PM

## 2024-03-03 NOTE — Progress Notes (Signed)
(  Sleep Hours) -9.75 (Any PRNs that were needed, meds refused, or side effects to meds)- none (Any disturbances and when (visitation, over night)-none (Concerns raised by the patient)- none (SI/HI/AVH)- denies all

## 2024-03-03 NOTE — Progress Notes (Signed)
 [facility] MD Progress Note  03/03/2024 1:19 PM Richard Moon  MRN:  [id number] Principal Problem: MDD (major depressive disorder), severe (HCC) Diagnosis: Principal Problem:   MDD (major depressive disorder), severe (HCC)   ID & Admission Information: Richard Moon is an 63 y.o. male who  has a past medical history of Cocaine abuse (HCC), Homeless, Major depressive disorder, recurrent severe without psychotic features (HCC), and Substance induced mood disorder (HCC).  He presented on 03/01/2024 10:41 PM for MDD (major depressive disorder), severe (HCC).  He presented with suicidal ideation in the context of crack cocaine addiction.  Subjective:   Case was discussed in the multidisciplinary team. MAR was reviewed and patient was compliant with medications.  No acute events occurred overnight.  Patient is COVID-positive.  PRN's in last 24 hours: None  Psychiatric Team made the following recommendations yesterday: Continued Zoloft  and trazodone  and vitamin D .  The patient continues to report depressed mood and suicidal ideation.  He contracts for safety in the hospital.  He is forward thinking and is interested in getting into a drug treatment program.  He prefers outpatient however inpatient.  Will plan on continuing current medications for today.  The patient denies homicidal ideation, auditory hallucinations, or visual hallucinations.  He denies medication side effects.    Past Psychiatric and Medical Medical History:  Past Medical History:  Diagnosis Date   Cocaine abuse (HCC)    Homeless    Major depressive disorder, recurrent severe without psychotic features (HCC)    Substance induced mood disorder (HCC)     Past Surgical History:  Procedure Laterality Date   HERNIA REPAIR      Family History(Medical and Psychiatric): History reviewed. No pertinent family history.     Social History:  Social History   Substance and Sexual Activity  Alcohol Use Yes   Comment: Occasional  alcohol use     Social History   Substance and Sexual Activity  Drug Use Yes   Types: Marijuana, Cocaine, Heroin   Comment: daily cocaine use (reported $200-300/ daily); occasional cannabis use    Social History   Socioeconomic History   Marital status: Single    Spouse name: Not on file   Number of children: Not on file   Years of education: Not on file   Highest education level: Not on file  Occupational History   Not on file  Tobacco Use   Smoking status: Some Days    Current packs/day: 0.25    Average packs/day: 0.3 packs/day for 20.0 years (5.0 ttl pk-yrs)    Types: Cigarettes   Smokeless tobacco: Never  Vaping Use   Vaping status: Every Day  Substance and Sexual Activity   Alcohol use: Yes    Comment: Occasional alcohol use   Drug use: Yes    Types: Marijuana, Cocaine, Heroin    Comment: daily cocaine use (reported $200-300/ daily); occasional cannabis use   Sexual activity: Yes    Birth control/protection: Condom  Other Topics Concern   Not on file  Social History Narrative   Not on file   Social Drivers of Health   Financial Resource Strain: Not on file  Food Insecurity: No Food Insecurity (03/02/2024)   Hunger Vital Sign    Worried About Running Out of Food in the Last Year: Never true    Ran Out of Food in the Last Year: Never true  Recent Concern: Food Insecurity - Food Insecurity Present (01/22/2024)   Hunger Vital Sign    Worried About Running  Out of Food in the Last Year: Sometimes true    Ran Out of Food in the Last Year: Sometimes true  Transportation Needs: No Transportation Needs (03/02/2024)   PRAPARE - Administrator, Civil Service (Medical): No    Lack of Transportation (Non-Medical): No  Physical Activity: Not on file  Stress: Not on file  Social Connections: Not on file        Current Medications: Current Facility-Administered Medications  Medication Dose Route Frequency Provider Last Rate Last Admin   acetaminophen   (TYLENOL ) tablet 650 mg  650 mg Oral Q6H PRN Coleman, Carolyn H, NP       alum & mag hydroxide-simeth (MAALOX/MYLANTA) 200-200-20 MG/5ML suspension 30 mL  30 mL Oral Q4H PRN Coleman, Carolyn H, NP       hydrOXYzine  (ATARAX ) tablet 25 mg  25 mg Oral TID PRN Coleman, Carolyn H, NP       magnesium  hydroxide (MILK OF MAGNESIA) suspension 30 mL  30 mL Oral Daily PRN Coleman, Carolyn H, NP       nicotine  (NICODERM CQ  - dosed in mg/24 hours) patch 21 mg  21 mg Transdermal Daily Coleman, Carolyn H, NP       OLANZapine  (ZYPREXA ) injection 5 mg  5 mg Intramuscular TID PRN Coleman, Carolyn H, NP       OLANZapine  zydis (ZYPREXA ) disintegrating tablet 5 mg  5 mg Oral TID PRN Coleman, Carolyn H, NP       sertraline  (ZOLOFT ) tablet 50 mg  50 mg Oral Daily Coleman, Carolyn H, NP   50 mg at 03/03/24 0757   traZODone  (DESYREL ) tablet 50 mg  50 mg Oral QHS Coleman, Carolyn H, NP   50 mg at 03/02/24 2122   Vitamin D  (Ergocalciferol ) (DRISDOL ) 1.25 MG (50000 UNIT) capsule 50,000 Units  50,000 Units Oral Daily Dmonte Maher S, MD   50,000 Units at 03/03/24 9241    Lab Results:  Results for orders placed or performed during the hospital encounter of 03/01/24 (from the past 48 hours)  RPR     Status: None   Collection Time: 03/02/24  7:00 PM  Result Value Ref Range   RPR Ser Ql NON REACTIVE NON REACTIVE    Comment: Performed at [facility], [address], [place], [place] [address]  VITAMIN D  25 Hydroxy (Vit-D Deficiency, Fractures)     Status: Abnormal   Collection Time: 03/02/24  7:00 PM  Result Value Ref Range   Vit D, 25-Hydroxy 25.23 (L) 30 - 100 ng/mL    Comment: (NOTE) Vitamin D  deficiency has been defined by the Institute of Medicine  and an Endocrine Society practice guideline as a level of serum 25-OH  vitamin D  less than 20 ng/mL (1,2). The Endocrine Society went on to  further define vitamin D  insufficiency as a level between 21 and 29  ng/mL (2).  1. IOM (Institute of Medicine). 2010.  Dietary reference intakes for  calcium and D. [place]: [employer]. 2. Holick MF, Binkley [place], Bischoff-Ferrari HA, et al. Evaluation,  treatment, and prevention of vitamin D  deficiency: an Endocrine  Society clinical practice guideline, JCEM. 2011 Jul; 96(7): 1911-30.  Performed at [facility], [address], [place], [place] [address]   HIV Antibody (routine testing w rflx)     Status: None   Collection Time: 03/02/24  7:00 PM  Result Value Ref Range   HIV Screen 4th Generation wRfx Non Reactive Non Reactive  Comment: Performed at [facility], [address], [place], [place] [address]  Folate     Status: None   Collection Time: 03/02/24  7:02 PM  Result Value Ref Range   Folate 11.3 >5.9 ng/mL    Comment: Performed at [facility], [address], [place], [place] [address]  Hemoglobin A1c     Status: Abnormal   Collection Time: 03/02/24  7:02 PM  Result Value Ref Range   Hgb A1c MFr Bld 5.9 (H) 4.8 - 5.6 %    Comment: (NOTE) Diagnosis of Diabetes The following HbA1c ranges recommended by the American Diabetes Association (ADA) may be used as an aid in the diagnosis of diabetes mellitus.  Hemoglobin             Suggested A1C NGSP%              Diagnosis  <5.7                   Non Diabetic  5.7-6.4                Pre-Diabetic  >6.4                   Diabetic  <7.0                   Glycemic control for                       adults with diabetes.     Mean Plasma Glucose 122.63 mg/dL    Comment: Performed at [facility], [address] [place], [place] [address]  Lipid panel     Status: Abnormal   Collection Time: 03/02/24  7:02 PM  Result Value Ref Range   Cholesterol 179 0 - 200 mg/dL    Comment:        ATP III CLASSIFICATION:  <200     mg/dL   Desirable  799-760  mg/dL   Borderline High  >=759    mg/dL   High           Triglycerides 168 (H) <150 mg/dL   HDL 45 >59 mg/dL   Total CHOL/HDL  Ratio 4.0 RATIO   VLDL 34 0 - 40 mg/dL   LDL Cholesterol 899 (H) 0 - 99 mg/dL    Comment:        Total Cholesterol/HDL:CHD Risk Coronary Heart Disease Risk Table                     Men   Women  1/2 Average Risk   3.4   3.3  Average Risk       5.0   4.4  2 X Average Risk   9.6   7.1  3 X Average Risk  23.4   11.0        Use the calculated Patient Ratio above and the CHD Risk Table to determine the patient's CHD Risk.        ATP III CLASSIFICATION (LDL):  <100     mg/dL   Optimal  899-870  mg/dL   Near or Above                    Optimal  130-159  mg/dL   Borderline  839-810  mg/dL   High  >809     mg/dL   Very High Performed at [facility], [address] [place], [place] [address]   TSH     Status: Abnormal   Collection Time: 03/02/24  7:02 PM  Result Value Ref Range   TSH 0.316 (L) 0.350 - 4.500 uIU/mL    Comment: Performed at [facility], [address] [place], [place] [address]  Vitamin B12     Status: None   Collection Time: 03/02/24  7:02 PM  Result Value Ref Range   Vitamin B-12 533 180 - 914 pg/mL    Comment: Performed at [facility], [address] [place], [place] [address]    Blood Alcohol level:  Lab Results  Component Value Date   ETH 43 (H) 02/29/2024   ETH <15 01/21/2024    Metabolic Disorder Labs: Lab Results  Component Value Date   HGBA1C 5.9 (H) 03/02/2024   MPG 122.63 03/02/2024   MPG 125.5 02/09/2023   No results found for: PROLACTIN Lab Results  Component Value Date   CHOL 179 03/02/2024   TRIG 168 (H) 03/02/2024   HDL 45 03/02/2024   CHOLHDL 4.0 03/02/2024   VLDL 34 03/02/2024   LDLCALC 100 (H) 03/02/2024   LDLCALC 65 02/09/2023    Physical Findings: AIMS:  , ,  ,  ,    CIWA:    COWS:     Psychiatric Specialty Exam:  Presentation  General Appearance: Fairly Groomed  Eye Contact: Good  Speech: Slow  Speech Volume: Decreased  Handedness: Right   Mood  and Affect  Mood: Depressed; Anxious  Affect: Restricted; Congruent   Thought Process  Thought Processes: Linear  Descriptions of Associations: Intact  Orientation: Full (Time, Place and Person)  Thought Content: Logical  History of Schizophrenia/Schizoaffective disorder: No data recorded Duration of Psychotic Symptoms: NA Hallucinations: Hallucinations: None  Ideas of Reference: None  Suicidal Thoughts: Suicidal Thoughts: Yes, Passive SI Active Intent and/or Plan: Without Plan SI Passive Intent and/or Plan: Without Intent; With Plan  Homicidal Thoughts: Homicidal Thoughts: No   Sensorium  Memory: Immediate Good; Recent Good  Judgment: Poor  Insight: Fair   Chartered certified accountant: Fair  Attention Span: Good  Recall: Good  Fund of Knowledge: Good  Language: Good   Psychomotor Activity  Psychomotor Activity: Psychomotor Activity: Restlessness   Assets  Assets: Communication Skills; Desire for Improvement; Resilience   Sleep  Sleep: Sleep: Fair   Musculoskeletal: Strength & Muscle Tone: within normal limits Gait & Station: normal Patient leans: N/A   Physical Exam: General: Sitting comfortably. NAD. HEENT: Normocephalic, atraumatic, MMM, EMOI Lungs: no increased work of breathing noted Heart: no cyanosis Abdomen: Non distended Musculoskeletal: FROM. No obvious deformities Skin: Warm, dry, intact. No rashes noted Neuro: No obvious focal deficits.  Gait and station are normal  Review of Systems  Constitutional: Negative.   HENT: Negative.    Eyes: Negative.   Respiratory: Negative.    Cardiovascular: Negative.   Gastrointestinal: Negative.   Genitourinary: Negative.   Skin: Negative.   Neurological: Negative.   Psychiatric/Behavioral:  Positive for depression, anxiety, suicidal ideation.     Blood pressure 95/63, pulse 69, temperature 98.1 F (36.7 C), temperature source Oral, resp. rate 18, height 5' 9 (1.753 m),  weight 70.3 kg, SpO2 100%. Body mass index is 22.89 kg/m.  ASSESSMENT: Rudolfo Brandow is an 63 y.o. male who  has a past medical history of Cocaine abuse (HCC), Homeless, Major depressive disorder, recurrent severe without psychotic features (HCC), and Substance induced mood disorder (HCC).  He presented on  03/01/2024 10:41 PM for MDD (major depressive disorder), severe (HCC).  He presented for suicidal ideation.  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      PLAN: Safety and Monitoring:  -- Voluntary admission to inpatient psychiatric unit for safety, stabilization and treatment  -- Daily contact with patient to assess and evaluate symptoms and progress in treatment  -- Patient's case to be discussed in multi-disciplinary team meeting  -- Observation Level : q15 minute checks  -- Vital signs:  q12 hours  -- Precautions: suicide, elopement, and assault  2. 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) 02/08/2023   Acute on chronic respiratory failure with hypoxia (HCC) 05/26/2022   Influenza A 05/26/2022   AKI (acute kidney injury) (HCC) 05/26/2022    Acute respiratory failure with hypoxemia (HCC) 05/26/2022   MDD (major depressive disorder) 12/04/2021   Substance induced mood disorder (HCC) 04/01/2021   Polysubstance abuse (HCC) 01/28/2021   Suicidal ideations 01/28/2021   Suicidal ideation    Cocaine-induced mood disorder (HCC)    Cocaine dependence with cocaine-induced mood disorder (HCC)    MDD (major depressive disorder), severe (HCC) 03/12/2018     Scheduled Medications:  nicotine   21 mg Transdermal Daily   sertraline   50 mg Oral Daily   traZODone   50 mg Oral QHS   Vitamin D  (Ergocalciferol )  50,000 Units Oral Daily    As Needed Medications: acetaminophen , alum & mag hydroxide-simeth, hydrOXYzine , magnesium  hydroxide, OLANZapine , OLANZapine  zydis    3. Medical Issues Being Addressed:   -- None  Labs reviewed, unremarkable with the exception of: Hypovitaminosis D   Tobacco Use Disorder  -- Nicotine  replacement as above  -- Smoking cessation encouraged  4. Discharge Planning:   -- Social work and case management to assist with discharge planning and identification of hospital follow-up needs prior to discharge  -- Estimated LOS: Likely discharge early next week  -- Discharge Concerns: Need to establish a safety plan; Medication compliance and effectiveness  -- Discharge Goals: Return home with outpatient referrals for mental health follow-up including medication management/psychotherapy  5. Short Term Goals:  Improve ability to identify changes in lifestyle to reduce recurrence of condition, verbalize feelings, disclose and discuss suicidal ideas, demonstrate self-control, identify and develop effective coping behaviors, compliance with prescribed medications, identify triggers associated with substance abuse/mental health issues, participate in unit milieu and in scheduled group therapies   6. Long Term Goals: Improvement in symptoms so the patient is ready for discharge   --The  risks/benefits/side-effects/alternatives to the medications above were discussed in detail with the patient and time was given for questions. The patient provided informed consent.   -- Metabolic profile and EKG monitoring obtained while on an atypical antipsychotic and  listed in the EHR    Total Time Spent in Direct Patient Care:  I personally spent 35 minutes on the unit in direct patient care. The direct patient care time included face-to-face time with the patient, reviewing the patient's chart, communicating with other professionals, and coordinating care. Greater than 50% of this time was spent in counseling or coordinating care with the patient regarding goals of hospitalization, psycho-education, and discharge planning needs.      Glendia Kitty, MD Psychiatrist  03/03/2024, 1:19 PM   I certify that inpatient services furnished can reasonably be expected to improve the patient's condition.    Portions of this note were created using voice recognition software. Minor syntax errors, grammatical content, spelling, or punctuation errors may have occurred unintentionally. Please notify the dino if the meaning of any statement is unclear.

## 2024-03-04 ENCOUNTER — Encounter (HOSPITAL_COMMUNITY): Payer: Self-pay | Admitting: Psychiatry

## 2024-03-04 DIAGNOSIS — E559 Vitamin D deficiency, unspecified: Secondary | ICD-10-CM

## 2024-03-04 DIAGNOSIS — E039 Hypothyroidism, unspecified: Secondary | ICD-10-CM

## 2024-03-04 DIAGNOSIS — R7303 Prediabetes: Secondary | ICD-10-CM

## 2024-03-04 HISTORY — DX: Hypothyroidism, unspecified: E03.9

## 2024-03-04 HISTORY — DX: Prediabetes: R73.03

## 2024-03-04 HISTORY — DX: Vitamin D deficiency, unspecified: E55.9

## 2024-03-04 MED ORDER — LEVOTHYROXINE SODIUM 25 MCG PO TABS
25.0000 ug | ORAL_TABLET | Freq: Every day | ORAL | Status: DC
  Administered 2024-03-05 – 2024-03-10 (×6): 25 ug via ORAL
  Filled 2024-03-04 (×3): qty 1
  Filled 2024-03-04: qty 7
  Filled 2024-03-04 (×2): qty 1
  Filled 2024-03-04: qty 7
  Filled 2024-03-04: qty 1

## 2024-03-04 NOTE — [facility] Group Notes (Deleted)
 [facility] Group Notes:  (Nursing/MHT/Case Management/Adjunct)  Date:  03/04/2024  Time:  4:22 AM  Type of Therapy:  AA Group  Participation Level:  Active  Participation Quality:  Appropriate  Affect:  Appropriate  Cognitive:  Appropriate  Insight:  Appropriate  Engagement in Group:  Engaged  Modes of Intervention:  Education  Summary of Progress/Problems: Attended group.  Richard Moon 03/04/2024, 4:22 AM

## 2024-03-04 NOTE — Group Note (Signed)
 Date:  03/04/2024 Time:  11:24 AM  Group Topic/Focus:  Safety Plan This group focused on helping patients identify personal safety goals and strategies to support emotional and physical well-being. Patients discussed the meaning of safety, explored warning signs, coping skills, and sources of support, and were encouraged to share individualized safety goals. The group emphasized self-awareness, personal strengths, and the importance of proactive planning in maintaining safety and supporting recovery.    Participation Level:  Did Not Attend  Participation Quality:  N/A  Affect:  N/A  Cognitive:  N/A  Insight: None  Engagement in Group:  None  Modes of Intervention:  N/A  Additional Comments:  Pt did not attend this safety plan group.  Kristi HERO Shavanna Furnari 03/04/2024, 11:24 AM

## 2024-03-04 NOTE — Plan of Care (Addendum)
 Patient was observed sleeping in his room during assessment. He was calm and cooperative throughout the shift. He denied suicidal ideation, homicidal ideation, and auditory or visual hallucinations, pain and vitals stable. He remain in isolation for Covid. He reported that his mood was "good," which was congruent with his calm affect. The patient was compliant with all prescribed medications and was later observed sleeping in his room later. He enquired when he will come out of isolation. Patient was educated that another test will be done in a couple of days to clear off Covid. Patient communicated an understanding of teachings. No unsafe behaviors were observed during the shift. Safety checks were completed every 15 minutes as per protocol.   Problem: Education: Goal: Knowledge of [facility] Education information/materials will improve Outcome: Progressing Goal: Emotional status will improve Outcome: Progressing Goal: Mental status will improve Outcome: Progressing Goal: Verbalization of understanding the information provided will improve Outcome: Progressing   Problem: Activity: Goal: Interest or engagement in activities will improve Outcome: Progressing Goal: Sleeping patterns will improve Outcome: Progressing   Problem: Coping: Goal: Ability to verbalize frustrations and anger appropriately will improve Outcome: Progressing Goal: Ability to demonstrate self-control will improve Outcome: Progressing   Problem: Health Behavior/Discharge Planning: Goal: Identification of resources available to assist in meeting health care needs will improve Outcome: Progressing Goal: Compliance with treatment plan for underlying cause of condition will improve Outcome: Progressing   Problem: Physical Regulation: Goal: Ability to maintain clinical measurements within normal limits will improve Outcome: Progressing   Problem: Safety: Goal: Periods of time without injury will  increase Outcome: Progressing

## 2024-03-04 NOTE — Progress Notes (Signed)
 D: Patient is alert, oriented, pleasant, and cooperative. Denies SI, HI, AVH, and verbally contracts for safety. Patient denies physical symptoms/pain. Patient is on isolation for covid.    A: Scheduled medications administered per MD order. Support provided. Patient educated on safety on the unit and medications. Routine safety checks every 15 minutes. Patient stated understanding to tell nurse about any new physical symptoms. Patient understands to tell staff of any needs.     R: No adverse drug reactions noted. Patient remains safe at this time and will continue to monitor.    03/04/24 0900  Psych Admission Type (Psych Patients Only)  Admission Status Voluntary  Psychosocial Assessment  Patient Complaints None  Eye Contact Fair  Facial Expression Animated  Affect Appropriate to circumstance  Speech Logical/coherent  Interaction Assertive  Motor Activity Other (Comment) (WNL)  Appearance/Hygiene Unremarkable  Behavior Characteristics Cooperative;Appropriate to situation;Calm  Mood Pleasant  Thought Process  Coherency WDL  Content WDL  Delusions None reported or observed  Perception WDL  Hallucination None reported or observed  Judgment Poor  Confusion None  Danger to Self  Current suicidal ideation? Denies  Agreement Not to Harm Self Yes  Description of Agreement verbal  Danger to Others  Danger to Others None reported or observed

## 2024-03-04 NOTE — [facility] Group Notes (Signed)
 [facility] Group Notes:  (Nursing/MHT/Case Management/Adjunct)  Date:  03/04/2024  Time:  4:44 AM  Type of Therapy:  AA Group  Participation Level:  Did Not Attend  Participation Quality:    Affect:    Cognitive:    Insight:    Engagement in Group:    Modes of Intervention:    Summary of Progress/Problems:PT unable to attend.  Grayce LITTIE Essex 03/04/2024, 4:44 AM

## 2024-03-04 NOTE — Progress Notes (Signed)
   03/04/24 0442  Psychosocial Assessment  Patient Complaints None  Eye Contact Fair  Facial Expression Animated  Affect Appropriate to circumstance  Speech Logical/coherent  Interaction Assertive  Motor Activity Other (Comment)  Appearance/Hygiene Unremarkable  Behavior Characteristics Cooperative;Appropriate to situation  Mood Pleasant  Thought Process  Coherency WDL  Content WDL  Delusions None reported or observed  Perception WDL  Hallucination None reported or observed  Judgment Poor  Confusion None  Danger to Self  Current suicidal ideation? Denies  Agreement Not to Harm Self Yes  Description of Agreement Verbal  Danger to Others  Danger to Others None reported or observed

## 2024-03-04 NOTE — Group Note (Signed)
 Date:  03/04/2024 Time:  10:22 AM  Group Topic/Focus:  Goals Group:   The focus of this group is to help patients establish daily goals to achieve during treatment and discuss how the patient can incorporate goal setting into their daily lives to aide in recovery.    Participation Level:  Did Not Attend  Participation Quality:  N/A  Affect:  N/A  Cognitive:  N/A  Insight: None  Engagement in Group:  None  Modes of Intervention:  N/A  Additional Comments:  Pt did not attend goals group.  Kristi HERO Juvencio Verdi 03/04/2024, 10:22 AM

## 2024-03-04 NOTE — Group Note (Signed)
 Date:  03/04/2024 Time:  4:50 PM  Group Topic/Focus:  Self Care:   The focus of this group is to help patients understand the importance of self-care and sleep hygiene in order to improve or restore emotional, physical, spiritual, interpersonal, and financial health.    Additional Comments:  Patient did not attend. Patient in isolation d/t covid.   Powell JAYSON Sharps 03/04/2024, 4:50 PM

## 2024-03-04 NOTE — Plan of Care (Signed)

## 2024-03-04 NOTE — Progress Notes (Signed)
 [facility] MD Progress Note  03/04/2024 12:31 PM Richard Moon  MRN:  [id number] Principal Problem: MDD (major depressive disorder), severe (HCC) Diagnosis: Principal Problem:   MDD (major depressive disorder), severe (HCC)   ID & Admission Information: Richard Moon is an 63 y.o. male who  has a past medical history of Cocaine abuse (HCC), Homeless, Hypothyroidism (03/04/2024), Hypovitaminosis D (03/04/2024), Major depressive disorder, recurrent severe without psychotic features (HCC), Prediabetes (03/04/2024), and Substance induced mood disorder (HCC).  He presented on 03/01/2024 10:41 PM for MDD (major depressive disorder), severe (HCC).  He presented with suicidal ideation in the context of crack cocaine addiction.  Subjective:   Case was discussed in the multidisciplinary team. MAR was reviewed and patient was compliant with medications.  No acute events occurred overnight.  Patient is COVID-positive.  PRN's in last 24 hours: None  Psychiatric Team made the following recommendations yesterday: Continued Zoloft  and trazodone  and vitamin D .  Richard Moon was seen in his room during rounds.  He reported that his mood was good today.  He denies any new psychiatric or medical complaints.  He reports that his appetite is good.  He reports that focus and concentration are adequate.  He denies issues with energy.  He reports adequate sleep.  He denies any medication side effects.  He denies suicidal ideations, homicidal ideations, auditory hallucinations, visual hallucinations, or delusions.     Past Psychiatric and Medical Medical History:  Past Medical History:  Diagnosis Date   Cocaine abuse (HCC)    Homeless    Hypothyroidism 03/04/2024   Hypovitaminosis D 03/04/2024   Major depressive disorder, recurrent severe without psychotic features (HCC)    Prediabetes 03/04/2024   Substance induced mood disorder (HCC)     Past Surgical History:  Procedure Laterality Date   HERNIA REPAIR       Family History(Medical and Psychiatric): History reviewed. No pertinent family history.     Social History:  Social History   Substance and Sexual Activity  Alcohol Use Yes   Comment: Occasional alcohol use     Social History   Substance and Sexual Activity  Drug Use Yes   Types: Marijuana, Cocaine, Heroin   Comment: daily cocaine use (reported $200-300/ daily); occasional cannabis use    Social History   Socioeconomic History   Marital status: Single    Spouse name: Not on file   Number of children: Not on file   Years of education: Not on file   Highest education level: Not on file  Occupational History   Not on file  Tobacco Use   Smoking status: Some Days    Current packs/day: 0.25    Average packs/day: 0.3 packs/day for 20.0 years (5.0 ttl pk-yrs)    Types: Cigarettes   Smokeless tobacco: Never  Vaping Use   Vaping status: Every Day  Substance and Sexual Activity   Alcohol use: Yes    Comment: Occasional alcohol use   Drug use: Yes    Types: Marijuana, Cocaine, Heroin    Comment: daily cocaine use (reported $200-300/ daily); occasional cannabis use   Sexual activity: Yes    Birth control/protection: Condom  Other Topics Concern   Not on file  Social History Narrative   Not on file   Social Drivers of Health   Financial Resource Strain: Not on file  Food Insecurity: No Food Insecurity (03/02/2024)   Hunger Vital Sign    Worried About Running Out of Food in the Last Year: Never true    Ran  Out of Food in the Last Year: Never true  Recent Concern: Food Insecurity - Food Insecurity Present (01/22/2024)   Hunger Vital Sign    Worried About Running Out of Food in the Last Year: Sometimes true    Ran Out of Food in the Last Year: Sometimes true  Transportation Needs: No Transportation Needs (03/02/2024)   PRAPARE - Administrator, Civil Service (Medical): No    Lack of Transportation (Non-Medical): No  Physical Activity: Not on file   Stress: Not on file  Social Connections: Not on file        Current Medications: Current Facility-Administered Medications  Medication Dose Route Frequency Provider Last Rate Last Admin   acetaminophen  (TYLENOL ) tablet 650 mg  650 mg Oral Q6H PRN Coleman, Carolyn H, NP       alum & mag hydroxide-simeth (MAALOX/MYLANTA) 200-200-20 MG/5ML suspension 30 mL  30 mL Oral Q4H PRN Coleman, Carolyn H, NP       hydrOXYzine  (ATARAX ) tablet 25 mg  25 mg Oral TID PRN Coleman, Carolyn H, NP       levothyroxine  (SYNTHROID ) tablet 25 mcg  25 mcg Oral Q0600 Kennyth Starleen RAMAN, MD       magnesium  hydroxide (MILK OF MAGNESIA) suspension 30 mL  30 mL Oral Daily PRN Coleman, Carolyn H, NP       nicotine  (NICODERM CQ  - dosed in mg/24 hours) patch 21 mg  21 mg Transdermal Daily Coleman, Carolyn H, NP       OLANZapine  (ZYPREXA ) injection 5 mg  5 mg Intramuscular TID PRN Coleman, Carolyn H, NP       OLANZapine  zydis (ZYPREXA ) disintegrating tablet 5 mg  5 mg Oral TID PRN Coleman, Carolyn H, NP       sertraline  (ZOLOFT ) tablet 50 mg  50 mg Oral Daily Coleman, Carolyn H, NP   50 mg at 03/04/24 9173   traZODone  (DESYREL ) tablet 50 mg  50 mg Oral QHS Coleman, Carolyn H, NP   50 mg at 03/03/24 2119   Vitamin D  (Ergocalciferol ) (DRISDOL ) 1.25 MG (50000 UNIT) capsule 50,000 Units  50,000 Units Oral Daily Yanisa Goodgame S, MD   50,000 Units at 03/04/24 9173    Lab Results:  Results for orders placed or performed during the hospital encounter of 03/01/24 (from the past 48 hours)  RPR     Status: None   Collection Time: 03/02/24  7:00 PM  Result Value Ref Range   RPR Ser Ql NON REACTIVE NON REACTIVE    Comment: Performed at [facility], [address] [place], [place] [address]  VITAMIN D  25 Hydroxy (Vit-D Deficiency, Fractures)     Status: Abnormal   Collection Time: 03/02/24  7:00 PM  Result Value Ref Range   Vit D, 25-Hydroxy 25.23 (L) 30 - 100 ng/mL    Comment: (NOTE) Vitamin D  deficiency has been defined  by the Institute of Medicine  and an Endocrine Society practice guideline as a level of serum 25-OH  vitamin D  less than 20 ng/mL (1,2). The Endocrine Society went on to  further define vitamin D  insufficiency as a level between 21 and 29  ng/mL (2).  1. IOM (Institute of Medicine). 2010. Dietary reference intakes for  calcium and D. [place]: [employer]. 2. Holick MF, Binkley Dermott, Bischoff-Ferrari HA, et al. Evaluation,  treatment, and prevention of vitamin D  deficiency: an Endocrine  Society clinical practice guideline, JCEM. 2011 Jul; 96(7): 1911-30.  Performed at [facility], [address] [place], [place] [address]   HIV Antibody (routine testing w rflx)     Status: None   Collection Time: 03/02/24  7:00 PM  Result Value Ref Range   HIV Screen 4th Generation wRfx Non Reactive Non Reactive    Comment: Performed at [facility], [address] [place], [place] [address]  Folate     Status: None   Collection Time: 03/02/24  7:02 PM  Result Value Ref Range   Folate 11.3 >5.9 ng/mL    Comment: Performed at [facility], [address] [place], [place] [address]  Hemoglobin A1c     Status: Abnormal   Collection Time: 03/02/24  7:02 PM  Result Value Ref Range   Hgb A1c MFr Bld 5.9 (H) 4.8 - 5.6 %    Comment: (NOTE) Diagnosis of Diabetes The following HbA1c ranges recommended by the American Diabetes Association (ADA) may be used as an aid in the diagnosis of diabetes mellitus.  Hemoglobin             Suggested A1C NGSP%              Diagnosis  <5.7                   Non Diabetic  5.7-6.4                Pre-Diabetic  >6.4                   Diabetic  <7.0                   Glycemic control for                       adults with diabetes.     Mean Plasma Glucose 122.63 mg/dL    Comment: Performed at [facility], [address] [place], [place] [address]  Lipid panel     Status: Abnormal   Collection Time:  03/02/24  7:02 PM  Result Value Ref Range   Cholesterol 179 0 - 200 mg/dL    Comment:        ATP III CLASSIFICATION:  <200     mg/dL   Desirable  799-760  mg/dL   Borderline High  >=759    mg/dL   High           Triglycerides 168 (H) <150 mg/dL   HDL 45 >59 mg/dL   Total CHOL/HDL Ratio 4.0 RATIO   VLDL 34 0 - 40 mg/dL   LDL Cholesterol 899 (H) 0 - 99 mg/dL    Comment:        Total Cholesterol/HDL:CHD Risk Coronary Heart Disease Risk Table                     Men   Women  1/2 Average Risk   3.4   3.3  Average Risk       5.0   4.4  2 X Average Risk   9.6   7.1  3 X Average Risk  23.4   11.0        Use the calculated Patient Ratio above and the CHD Risk Table to determine the patient's CHD Risk.        ATP III CLASSIFICATION (LDL):  <100     mg/dL   Optimal  899-870  mg/dL   Near or Above  Optimal  130-159  mg/dL   Borderline  839-810  mg/dL   High  >809     mg/dL   Very High Performed at [facility], [address] [place], [place] [address]   TSH     Status: Abnormal   Collection Time: 03/02/24  7:02 PM  Result Value Ref Range   TSH 0.316 (L) 0.350 - 4.500 uIU/mL    Comment: Performed at [facility], [address] [place], [place] [address]  Vitamin B12     Status: None   Collection Time: 03/02/24  7:02 PM  Result Value Ref Range   Vitamin B-12 533 180 - 914 pg/mL    Comment: Performed at [facility], [address], [place], [place] [address]  T4, free     Status: Abnormal   Collection Time: 03/03/24  6:57 PM  Result Value Ref Range   Free T4 0.58 (L) 0.61 - 1.12 ng/dL    Comment: (NOTE) Biotin ingestion may interfere with free T4 tests. If the results are inconsistent with the TSH level, previous test results, or the clinical presentation, then consider biotin interference. If needed, order repeat testing after stopping biotin. Performed at [facility], [address] [place], [place] [address]     Blood Alcohol level:  Lab Results  Component Value Date   ETH 43 (H) 02/29/2024   ETH <15 01/21/2024    Metabolic Disorder Labs: Lab Results  Component Value Date   HGBA1C 5.9 (H) 03/02/2024   MPG 122.63 03/02/2024   MPG 125.5 02/09/2023   No results found for: PROLACTIN Lab Results  Component Value Date   CHOL 179 03/02/2024   TRIG 168 (H) 03/02/2024   HDL 45 03/02/2024   CHOLHDL 4.0 03/02/2024   VLDL 34 03/02/2024   LDLCALC 100 (H) 03/02/2024   LDLCALC 65 02/09/2023    Physical Findings: AIMS:  , ,  ,  ,    CIWA:    COWS:     Psychiatric Specialty Exam:  Presentation  General Appearance: Fairly Groomed  Eye Contact: Good  Speech: Slow  Speech Volume: Decreased  Handedness: Right   Mood and Affect  Mood: Depressed; Anxious  Affect: Restricted; Congruent   Thought Process  Thought Processes: Linear  Descriptions of Associations: Intact  Orientation: Full (Time, Place and Person)  Thought Content: Logical  History of Schizophrenia/Schizoaffective disorder: No data recorded Duration of Psychotic Symptoms: NA Hallucinations: Hallucinations: None  Ideas of Reference: None  Suicidal Thoughts: Suicidal Thoughts: Yes, Passive SI Passive Intent and/or Plan: Without Intent; With Plan  Homicidal Thoughts: Homicidal Thoughts: No   Sensorium  Memory: Immediate Good; Recent Good  Judgment: Poor  Insight: Fair   Chartered certified accountant: Fair  Attention Span: Good  Recall: Good  Fund of Knowledge: Good  Language: Good   Psychomotor Activity  Psychomotor Activity: Psychomotor Activity: Restlessness   Assets  Assets: Communication Skills; Desire for Improvement; Resilience   Sleep  Sleep: Sleep: Fair   Musculoskeletal: Strength & Muscle Tone: within normal limits Gait & Station: normal Patient leans: N/A   Physical Exam: General: Sitting comfortably. NAD. HEENT:  Normocephalic, atraumatic, MMM, EMOI Lungs: no increased work of breathing noted Heart: no cyanosis Abdomen: Non distended Musculoskeletal: FROM. No obvious deformities Skin: Warm, dry, intact. No rashes noted Neuro: No obvious focal deficits.  Gait and station are normal  Review of Systems  Constitutional: Negative.   HENT: Negative.  Eyes: Negative.   Respiratory: Negative.    Cardiovascular: Negative.   Gastrointestinal: Negative.   Genitourinary: Negative.   Skin: Negative.   Neurological: Negative.   Psychiatric/Behavioral:  Positive for depression, anxiety, suicidal ideation.     Blood pressure 95/63, pulse 69, temperature 97.9 F (36.6 C), temperature source Oral, resp. rate 18, height 5' 9 (1.753 m), weight 70.3 kg, SpO2 100%. Body mass index is 22.89 kg/m.  ASSESSMENT: Richard Moon is an 63 y.o. male who  has a past medical history of Cocaine abuse (HCC), Homeless, Hypothyroidism (03/04/2024), Hypovitaminosis D (03/04/2024), Major depressive disorder, recurrent severe without psychotic features (HCC), Prediabetes (03/04/2024), and Substance induced mood disorder (HCC).  He presented on 03/01/2024 10:41 PM for MDD (major depressive disorder), severe (HCC).  He presented for suicidal ideation.  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) 02/08/2023   Acute on chronic respiratory failure with hypoxia (HCC) 05/26/2022   Influenza A 05/26/2022   AKI (acute kidney injury) (HCC) 05/26/2022   Acute respiratory failure with hypoxemia (HCC) 05/26/2022   MDD (major depressive disorder) 12/04/2021   Substance induced mood disorder (HCC) 04/01/2021   Polysubstance abuse (HCC) 01/28/2021   Suicidal ideations 01/28/2021   Suicidal ideation    Cocaine-induced mood disorder (HCC)    Cocaine dependence with cocaine-induced mood disorder (HCC)     MDD (major depressive disorder), severe (HCC) 03/12/2018      PLAN: Safety and Monitoring:  -- Voluntary admission to inpatient psychiatric unit for safety, stabilization and treatment  -- Daily contact with patient to assess and evaluate symptoms and progress in treatment  -- Patient's case to be discussed in multi-disciplinary team meeting  -- Observation Level : q15 minute checks  -- Vital signs:  q12 hours  -- Precautions: suicide, elopement, and assault  2. 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     Scheduled Medications:  levothyroxine   25 mcg Oral Q0600   nicotine   21 mg Transdermal Daily   sertraline   50 mg Oral Daily   traZODone   50 mg Oral QHS   Vitamin D  (Ergocalciferol )  50,000 Units Oral Daily    As Needed Medications: acetaminophen , alum & mag hydroxide-simeth, hydrOXYzine , magnesium  hydroxide, OLANZapine , OLANZapine  zydis    3. Medical Issues Being Addressed:   -- None  Labs reviewed, unremarkable with the exception of: Hypovitaminosis D   Tobacco Use Disorder  -- Nicotine  replacement as above  -- Smoking cessation encouraged  4. Discharge Planning:   -- Social work and case management to assist with discharge planning and identification of hospital  follow-up needs prior to discharge  -- Estimated LOS: Likely discharge early next week  -- Discharge Concerns: Need to establish a safety plan; Medication compliance and  effectiveness  -- Discharge Goals: Return home with outpatient referrals for mental health follow-up including medication management/psychotherapy  5. Short Term Goals:  Improve ability to identify changes in lifestyle to reduce recurrence of condition, verbalize feelings, disclose and discuss suicidal ideas, demonstrate self-control, identify and develop effective coping behaviors, compliance with prescribed medications, identify triggers associated with substance abuse/mental health issues, participate in unit milieu and in scheduled group therapies   6. Long Term Goals: Improvement in symptoms so the patient is ready for discharge   --The risks/benefits/side-effects/alternatives to the medications above were discussed in detail with the patient and time was given for questions. The patient provided informed consent.   -- Metabolic profile and EKG monitoring obtained while on an atypical antipsychotic and listed in the EHR    Total Time Spent in Direct Patient Care:  I personally spent 35 minutes on the unit in direct patient care. The direct patient care time included face-to-face time with the patient, reviewing the patient's chart, communicating with other professionals, and coordinating care. Greater than 50% of this time was spent in counseling or coordinating care with the patient regarding goals of hospitalization, psycho-education, and discharge planning needs.      Glendia Kitty, MD Psychiatrist  03/04/2024, 12:31 PM   I certify that inpatient services furnished can reasonably be expected to improve the patient's condition.    Portions of this note were created using voice recognition software. Minor syntax errors, grammatical content, spelling, or punctuation errors may have occurred unintentionally. Please  notify the dino if the meaning of any statement is unclear.

## 2024-03-04 NOTE — [facility] Group Notes (Signed)
 LCSW Wellness Group Note   03/04/2024 11:00am  Type of Group and Topic: Psychoeducational Group:  Wellness  Participation Level:  did not attend  Description of Group  Wellness group introduces the topic and its focus on developing healthy habits across the spectrum and its relationship to a decrease in hospital admissions.  Six areas of wellness are discussed: physical, social spiritual, intellectual, occupational, and emotional.  Patients are asked to consider their current wellness habits and to identify areas of wellness where they are interested and able to focus on improvements.    Therapeutic Goals Patients will understand components of wellness and how they can positively impact overall health.  Patients will identify areas of wellness where they have developed good habits. Patients will identify areas of wellness where they would like to make improvements.    Summary of Patient Progress     Therapeutic Modalities: Cognitive Behavioral Therapy Psychoeducation    Bridget Cordella Simmonds, LCSW

## 2024-03-05 NOTE — Group Note (Signed)
 Date:  03/05/2024 Time:  5:45 PM  Group Topic/Focus:  Coping With Mental Health Crisis:   The purpose of this group is to help patients identify strategies for coping with mental health crisis.  Group discusses possible causes of crisis and ways to manage them effectively. Identifying Needs:   The focus of this group is to help patients identify their personal needs that have been historically problematic and identify healthy behaviors to address their needs. Wellness Toolbox:   The focus of this group is to discuss various aspects of wellness, balancing those aspects and exploring ways to increase the ability to experience wellness.  Patients will create a wellness toolbox for use upon discharge.    Participation Level:  Did Not Attend  Participation Quality:    Affect:    Cognitive:    Insight:   Engagement in Group:    Modes of Intervention:    Additional Comments:    Richard Moon 03/05/2024, 5:45 PM

## 2024-03-05 NOTE — Plan of Care (Signed)

## 2024-03-05 NOTE — Group Note (Signed)
 Date:  03/05/2024 Time:  9:26 PM  Group Topic/Focus:  Wrap-Up Group:   The focus of this group is to help patients review their daily goal of treatment and discuss progress on daily workbooks.    Participation Level:  Did Not Attend  Participation Quality:  none  Affect:  none  Cognitive:  none  Insight: None  Engagement in Group:  None  Modes of Intervention:  none  Additional Comments:   Pt was encouraged but refused to attend wrap up group  Elroy Schembri A Shamiyah Ngu 03/05/2024, 9:26 PM

## 2024-03-05 NOTE — Plan of Care (Signed)
   Problem: Education: Goal: Knowledge of [facility] Education information/materials will improve Outcome: Progressing   Problem: Coping: Goal: Ability to verbalize frustrations and anger appropriately will improve Outcome: Progressing   Problem: Safety: Goal: Periods of time without injury will increase Outcome: Progressing

## 2024-03-05 NOTE — Progress Notes (Signed)
   03/05/24 0900  Psych Admission Type (Psych Patients Only)  Admission Status Voluntary  Psychosocial Assessment  Patient Complaints None  Eye Contact Fair  Facial Expression Animated  Affect Appropriate to circumstance  Speech Logical/coherent  Interaction Assertive  Motor Activity Other (Comment) (WNL)  Appearance/Hygiene Unremarkable  Behavior Characteristics Appropriate to situation;Cooperative;Calm  Mood Pleasant  Thought Process  Coherency WDL  Content WDL  Delusions None reported or observed  Perception WDL  Hallucination None reported or observed  Judgment Poor  Confusion None  Danger to Self  Current suicidal ideation? Denies  Agreement Not to Harm Self Yes  Description of Agreement verbal  Danger to Others  Danger to Others None reported or observed

## 2024-03-05 NOTE — Progress Notes (Signed)
 [facility] MD Progress Note  03/05/2024 2:50 PM Richard Moon  MRN:  [id number] Principal Problem: MDD (major depressive disorder), severe (HCC) Diagnosis: Principal Problem:   MDD (major depressive disorder), severe (HCC)   ID & Admission Information: Richard Moon is an 63 y.o. male who  has a past medical history of Cocaine abuse (HCC), Homeless, Hypothyroidism (03/04/2024), Hypovitaminosis D (03/04/2024), Major depressive disorder, recurrent severe without psychotic features (HCC), Prediabetes (03/04/2024), and Substance induced mood disorder (HCC).  He presented on 03/01/2024 10:41 PM for MDD (major depressive disorder), severe (HCC).  He presented with suicidal ideation in the context of crack cocaine addiction.  Subjective:   Case was discussed in the multidisciplinary team. MAR was reviewed and patient was compliant with medications.  No acute events occurred overnight.  Patient is COVID-positive.   Richard Moon was seen in his room during rounds.  He reported that his mood was good today.  He denies any new psychiatric or medical complaints.  He reports that his appetite is good.  He reports that focus and concentration are adequate.  He denies issues with energy.  He reports adequate sleep.  He denies any medication side effects.  He denies suicidal ideations, homicidal ideations, auditory hallucinations, visual hallucinations, or delusions.  We discussed adding Synthroid  to his regimen due to hypothyroidism.  Psychoeducation was provided.  He states he has never been diagnosed with hypothyroidism before.     Past Psychiatric and Medical Medical History:  Past Medical History:  Diagnosis Date   Cocaine abuse (HCC)    Homeless    Hypothyroidism 03/04/2024   Hypovitaminosis D 03/04/2024   Major depressive disorder, recurrent severe without psychotic features (HCC)    Prediabetes 03/04/2024   Substance induced mood disorder (HCC)     Past Surgical History:  Procedure Laterality Date    HERNIA REPAIR      Family History(Medical and Psychiatric): History reviewed. No pertinent family history.     Social History:  Social History   Substance and Sexual Activity  Alcohol Use Yes   Comment: Occasional alcohol use     Social History   Substance and Sexual Activity  Drug Use Yes   Types: Marijuana, Cocaine, Heroin   Comment: daily cocaine use (reported $200-300/ daily); occasional cannabis use    Social History   Socioeconomic History   Marital status: Single    Spouse name: Not on file   Number of children: Not on file   Years of education: Not on file   Highest education level: Not on file  Occupational History   Not on file  Tobacco Use   Smoking status: Some Days    Current packs/day: 0.25    Average packs/day: 0.3 packs/day for 20.0 years (5.0 ttl pk-yrs)    Types: Cigarettes   Smokeless tobacco: Never  Vaping Use   Vaping status: Every Day  Substance and Sexual Activity   Alcohol use: Yes    Comment: Occasional alcohol use   Drug use: Yes    Types: Marijuana, Cocaine, Heroin    Comment: daily cocaine use (reported $200-300/ daily); occasional cannabis use   Sexual activity: Yes    Birth control/protection: Condom  Other Topics Concern   Not on file  Social History Narrative   Not on file   Social Drivers of Health   Financial Resource Strain: Not on file  Food Insecurity: No Food Insecurity (03/02/2024)   Hunger Vital Sign    Worried About Running Out of Food in the Last Year: Never  true    Ran Out of Food in the Last Year: Never true  Recent Concern: Food Insecurity - Food Insecurity Present (01/22/2024)   Hunger Vital Sign    Worried About Running Out of Food in the Last Year: Sometimes true    Ran Out of Food in the Last Year: Sometimes true  Transportation Needs: No Transportation Needs (03/02/2024)   PRAPARE - Administrator, Civil Service (Medical): No    Lack of Transportation (Non-Medical): No  Physical Activity: Not  on file  Stress: Not on file  Social Connections: Not on file        Current Medications: Current Facility-Administered Medications  Medication Dose Route Frequency Provider Last Rate Last Admin   acetaminophen  (TYLENOL ) tablet 650 mg  650 mg Oral Q6H PRN Coleman, Carolyn H, NP       alum & mag hydroxide-simeth (MAALOX/MYLANTA) 200-200-20 MG/5ML suspension 30 mL  30 mL Oral Q4H PRN Coleman, Carolyn H, NP       hydrOXYzine  (ATARAX ) tablet 25 mg  25 mg Oral TID PRN Coleman, Carolyn H, NP       levothyroxine  (SYNTHROID ) tablet 25 mcg  25 mcg Oral Q0600 Richard Reliford S, MD   25 mcg at 03/05/24 9381   magnesium  hydroxide (MILK OF MAGNESIA) suspension 30 mL  30 mL Oral Daily PRN Coleman, Carolyn H, NP       nicotine  (NICODERM CQ  - dosed in mg/24 hours) patch 21 mg  21 mg Transdermal Daily Coleman, Carolyn H, NP       OLANZapine  (ZYPREXA ) injection 5 mg  5 mg Intramuscular TID PRN Coleman, Carolyn H, NP       OLANZapine  zydis (ZYPREXA ) disintegrating tablet 5 mg  5 mg Oral TID PRN Coleman, Carolyn H, NP       sertraline  (ZOLOFT ) tablet 50 mg  50 mg Oral Daily Coleman, Carolyn H, NP   50 mg at 03/05/24 0757   traZODone  (DESYREL ) tablet 50 mg  50 mg Oral QHS Coleman, Carolyn H, NP   50 mg at 03/04/24 2148   Vitamin D  (Ergocalciferol ) (DRISDOL ) 1.25 MG (50000 UNIT) capsule 50,000 Units  50,000 Units Oral Daily Richard Siebel S, MD   50,000 Units at 03/05/24 0757    Lab Results:  Results for orders placed or performed during the hospital encounter of 03/01/24 (from the past 48 hours)  T4, free     Status: Abnormal   Collection Time: 03/03/24  6:57 PM  Result Value Ref Range   Free T4 0.58 (L) 0.61 - 1.12 ng/dL    Comment: (NOTE) Biotin ingestion may interfere with free T4 tests. If the results are inconsistent with the TSH level, previous test results, or the clinical presentation, then consider biotin interference. If needed, order repeat testing after stopping biotin. Performed at [facility], [address] [place], [place] [address]     Blood Alcohol level:  Lab Results  Component Value Date   ETH 43 (H) 02/29/2024   ETH <15 01/21/2024    Metabolic Disorder Labs: Lab Results  Component Value Date   HGBA1C 5.9 (H) 03/02/2024   MPG 122.63 03/02/2024   MPG 125.5 02/09/2023   No results found for: PROLACTIN Lab Results  Component Value Date   CHOL 179 03/02/2024   TRIG 168 (H) 03/02/2024   HDL 45 03/02/2024   CHOLHDL 4.0 03/02/2024   VLDL 34 03/02/2024   LDLCALC 100 (H) 03/02/2024   LDLCALC 65 02/09/2023  Physical Findings: AIMS:  , ,  ,  ,    CIWA:    COWS:     Psychiatric Specialty Exam:  Presentation  General Appearance: Fairly Groomed  Eye Contact: Good  Speech: Slow  Speech Volume: Decreased  Handedness: Right   Mood and Affect  Mood: Depressed; Anxious  Affect: Restricted; Congruent   Thought Process  Thought Processes: Linear  Descriptions of Associations: Intact  Orientation: Full (Time, Place and Person)  Thought Content: Logical  History of Schizophrenia/Schizoaffective disorder: No data recorded Duration of Psychotic Symptoms: NA Hallucinations: No data recorded  Ideas of Reference: None  Suicidal Thoughts: No data recorded  Homicidal Thoughts: No data recorded   Sensorium  Memory: Immediate Good; Recent Good  Judgment: Poor  Insight: Fair   Art therapist  Concentration: Fair  Attention Span: Good  Recall: Good  Fund of Knowledge: Good  Language: Good   Psychomotor Activity  Psychomotor Activity: No data recorded   Assets  Assets: Communication Skills; Desire for Improvement; Resilience   Sleep  Sleep: No data recorded   Musculoskeletal: Strength & Muscle Tone: within normal limits Gait & Station: normal Patient leans: N/A   Physical Exam: General: Sitting comfortably. NAD. HEENT: Normocephalic, atraumatic, MMM, EMOI Lungs: no increased work of breathing  noted Heart: no cyanosis Abdomen: Non distended Musculoskeletal: FROM. No obvious deformities Skin: Warm, dry, intact. No rashes noted Neuro: No obvious focal deficits.  Gait and station are normal  Review of Systems  Constitutional: Negative.   HENT: Negative.    Eyes: Negative.   Respiratory: Negative.    Cardiovascular: Negative.   Gastrointestinal: Negative.   Genitourinary: Negative.   Skin: Negative.   Neurological: Negative.   Psychiatric/Behavioral:  Positive for depression, anxiety, suicidal ideation.     Blood pressure 95/63, pulse 69, temperature 97.9 F (36.6 C), temperature source Oral, resp. rate 18, height 5' 9 (1.753 m), weight 70.3 kg, SpO2 100%. Body mass index is 22.89 kg/m.  ASSESSMENT: Dayvon Dax is an 63 y.o. male who  has a past medical history of Cocaine abuse (HCC), Homeless, Hypothyroidism (03/04/2024), Hypovitaminosis D (03/04/2024), Major depressive disorder, recurrent severe without psychotic features (HCC), Prediabetes (03/04/2024), and Substance induced mood disorder (HCC).  He presented on 03/01/2024 10:41 PM for MDD (major depressive disorder), severe (HCC).  He presented for suicidal ideation.  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       PLAN: Safety and Monitoring:  -- Voluntary admission to inpatient psychiatric unit for safety, stabilization and treatment  -- Daily contact with patient to assess and evaluate symptoms and progress in treatment  -- Patient'Moon case to be discussed in multi-disciplinary team meeting  -- Observation Level : q15 minute checks  -- Vital signs:  q12 hours  -- Precautions: suicide, elopement, and assault  2. Psychiatric Diagnoses and Treatment:  Patient Active  Problem List   Diagnosis Date Noted   Substance abuse (HCC) 06/05/2023   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     Scheduled Medications:  levothyroxine   25 mcg Oral Q0600   nicotine   21 mg Transdermal Daily   sertraline   50 mg Oral Daily   traZODone   50 mg Oral QHS   Vitamin D  (Ergocalciferol )  50,000 Units Oral Daily    As Needed Medications: acetaminophen , alum & mag hydroxide-simeth, hydrOXYzine , magnesium  hydroxide, OLANZapine , OLANZapine  zydis    3. Medical Issues Being Addressed:   -- None  Labs reviewed, unremarkable with the exception of: Hypovitaminosis D   Tobacco Use Disorder  -- Nicotine  replacement as above  -- Smoking cessation encouraged  4. Discharge Planning:   -- Social work and case management to assist with discharge planning and identification of hospital follow-up needs prior to discharge  -- Estimated LOS: Likely  discharge early next week  -- Discharge Concerns: Need to establish a safety plan; Medication compliance and effectiveness  -- Discharge Goals: Return home with outpatient referrals for mental health follow-up including medication management/psychotherapy  5. Short Term Goals:  Improve ability to identify changes in lifestyle to reduce recurrence of condition, verbalize feelings, disclose and discuss suicidal ideas, demonstrate self-control, identify and develop effective coping behaviors, compliance with prescribed medications, identify triggers associated with substance abuse/mental health issues, participate in unit milieu and in scheduled group therapies   6. Long Term Goals: Improvement in symptoms so the patient is ready for discharge   --The risks/benefits/side-effects/alternatives to the medications above were discussed in detail with the patient and time was given for questions. The patient provided informed consent.   -- Metabolic profile and EKG monitoring obtained while on an atypical antipsychotic and listed in the EHR    Total Time Spent in Direct Patient Care:  I personally spent 35 minutes on the unit in direct patient care. The direct patient care time included face-to-face time with the patient, reviewing the patient'Moon chart, communicating with other professionals, and coordinating care. Greater than 50% of this time was spent in counseling or coordinating care with the patient regarding goals of hospitalization, psycho-education, and discharge planning needs.      Glendia Kitty, MD Psychiatrist  03/05/2024, 2:50 PM   I certify that inpatient services furnished can reasonably be expected to improve the patient'Moon condition.    Portions of this note were created using voice recognition software. Minor syntax errors, grammatical content, spelling, or punctuation errors may have occurred unintentionally. Please notify the dino if the meaning of any statement is  unclear.

## 2024-03-05 NOTE — Progress Notes (Signed)
(  Sleep Hours) - 10.5 (Any PRNs that were needed, meds refused, or side effects to meds)- Trazodone  (Any disturbances and when (visitation, over night)- none (Concerns raised by the patient)- none  (SI/HI/AVH)- denies.

## 2024-03-05 NOTE — Group Note (Signed)
 Date:  03/05/2024 Time:  12:00 PM  Group Topic/Focus:  Goals Group:   The focus of this group is to help patients establish daily goals to achieve during treatment and discuss how the patient can incorporate goal setting into their daily lives to aide in recovery.    Participation Level:  Pt Did Not Attend  Participation Quality:  Pt Did Not Attend  Affect:  Pt Did Not Attend  Cognitive:  Pt Did Not Attend  Insight: Pt Did Not Attend  Engagement in Group:  Pt Did Not Attend  Modes of Intervention:  Pt Did Not Attend  Additional Comments:  Pt Did Not Attend  Trek Kimball E Tobenna Needs 03/05/2024, 12:00 PM

## 2024-03-06 LAB — T3: T3, Total: 98 ng/dL (ref 71–180)

## 2024-03-06 NOTE — Plan of Care (Signed)
   Problem: Education: Goal: Knowledge of [place] [facility] Education information/materials will improve Outcome: Progressing Goal: Emotional status will improve Outcome: Progressing Goal: Mental status will improve Outcome: Progressing Goal: Verbalization of understanding the information provided will improve Outcome: Progressing

## 2024-03-06 NOTE — BHH Counselor (Signed)
 CSW spoke with patient regarding admission denial to [facility]. CSW offered patient nurse cell phone for Martie Quinones [phone], which is a faith based treatment program in [place], however patient reported not being interested as he feels it would have too much of a [place] component. Informed patient that this option would be better than not having anywhere to go upon discharge. CSW to resend referral to [facility] with updated substance use disorder on progress note as discussed with provider.   Honour Schwieger, LCSWA 03/06/24 2:55PM

## 2024-03-06 NOTE — Group Note (Signed)
 Date:  03/06/2024 Time:  4:36 PM  Group Topic/Focus:  Ask Me 3    Participation Level:  Did Not Attend  Participation Quality:  Did Not Attend  Affect:  Did Not Attend  Cognitive:  Did Not Attend  Insight: Did Not Attend  Engagement in Group:  Did Not Attend  Modes of Intervention:  Did Not Attend  Additional Comments:    Jamar Casagrande 03/06/2024, 4:36 PM

## 2024-03-06 NOTE — Progress Notes (Signed)
   03/05/24 1955  Psych Admission Type (Psych Patients Only)  Admission Status Voluntary  Psychosocial Assessment  Patient Complaints None  Eye Contact Fair  Facial Expression Other (Comment) (WNL)  Affect Appropriate to circumstance  Speech Logical/coherent  Interaction Other (Comment) (WNL)  Motor Activity Other (Comment) (WNL)  Appearance/Hygiene Unremarkable  Behavior Characteristics Appropriate to situation  Mood Pleasant  Thought Process  Coherency WDL  Content WDL  Delusions None reported or observed  Perception WDL  Hallucination None reported or observed  Judgment Poor  Confusion None  Danger to Self  Current suicidal ideation?  (Denies)  Agreement Not to Harm Self Yes  Description of Agreement Notify Staff  Danger to Others  Danger to Others None reported or observed

## 2024-03-06 NOTE — Progress Notes (Signed)

## 2024-03-06 NOTE — Progress Notes (Signed)
   03/06/24 0800  Psych Admission Type (Psych Patients Only)  Admission Status Voluntary  Psychosocial Assessment  Patient Complaints None  Eye Contact Fair  Facial Expression Animated  Affect Appropriate to circumstance  Speech Logical/coherent  Interaction Assertive  Motor Activity Other (Comment) (WNL)  Appearance/Hygiene Unremarkable  Behavior Characteristics Cooperative;Appropriate to situation;Calm  Mood Pleasant  Thought Process  Coherency WDL  Content WDL  Delusions None reported or observed  Perception WDL  Hallucination None reported or observed  Judgment Limited  Confusion None  Danger to Self  Current suicidal ideation? Denies  Agreement Not to Harm Self Yes  Description of Agreement verbal  Danger to Others  Danger to Others None reported or observed

## 2024-03-06 NOTE — Progress Notes (Addendum)
 [facility] Psychiatry Progress Note  Date: 03/06/24 Patient: Richard Moon MRN: [id number]  Assessment and Plan: Richard Moon is a 63 y.o. male admitted for suicidal ideation in the context of homelessness and drug abuse (cocaine). He has a history of cocaine abuse and depression.   # Cocaine dependence with cocaine-induced mood disorder (HCC) - Sertraline  50 mg daily - Recommendation is for inpatient SUD w/ [facility] - $200-300 daily use  # Hypothyroidism - Synthroid  25 mcg daily  Risk Assessment - Moderate  Discharge Planning Barriers to discharge: Unable to go to SUD treatment due to COVID positive test on 9th. Estimated length of stay: TBD Predicted Discharge location: Inpatient rehab     Interval History and update: Chart reviewed. No significant events overnight. Patient has been med compliant. He continues to be on isolation protocol from the positive COVID test on the 9th. He denies any symptoms related to COVID. He stated that he continues to experience intermittent SI, and had some thoughts of being better off dead this morning. He states that he is motivated to engage in SUD treatment but was denied at [facility] as he is COVID positive. He expressed some conditional suicidal ideation related to homelessness. He reported that he mood was okay and he endorsed some generalized mild to moderate anxiety. He denied any medication side effects. We discussed discharge planning and he is uncertain what he would do or where he would go if not to inpatient SUD treatment.       Physical Exam MSK/Neuro - Normal gait and station Mental Status Exam Appearance - casually dressed, lying in bed Attitude - Calm, polite, not guarded Speech - normal volume, prosody, inflection Mood - Okay Affect - Restricted Thought Process - LLGD Thought Content - No delusional TC expressed SI/HI - Endorses ongoing thoughts of being better off dead, no expressed  active intent Perceptions - Denies AVH; not RIS Judgement/Insight - [employer] of knowledge - WNL Language - No impairments      Lab Results:  Admission on 03/01/2024  Component Date Value Ref Range Status   Folate 03/02/2024 11.3  >5.9 ng/mL Final   Hgb A1c MFr Bld 03/02/2024 5.9 (H)  4.8 - 5.6 % Final   Mean Plasma Glucose 03/02/2024 122.63  mg/dL Final   Cholesterol 90/88/7974 179  0 - 200 mg/dL Final   Triglycerides 90/88/7974 168 (H)  <150 mg/dL Final   HDL 90/88/7974 45  >40 mg/dL Final   Total CHOL/HDL Ratio 03/02/2024 4.0  RATIO Final   VLDL 03/02/2024 34  0 - 40 mg/dL Final   LDL Cholesterol 03/02/2024 100 (H)  0 - 99 mg/dL Final   RPR Ser Ql 90/88/7974 NON REACTIVE  NON REACTIVE Final   TSH 03/02/2024 0.316 (L)  0.350 - 4.500 uIU/mL Final   Vitamin B-12 03/02/2024 533  180 - 914 pg/mL Final   Vit D, 25-Hydroxy 03/02/2024 25.23 (L)  30 - 100 ng/mL Final   HIV Screen 4th Generation wRfx 03/02/2024 Non Reactive  Non Reactive Final   T3, Total 03/03/2024 98  71 - 180 ng/dL Final   Free T4 90/87/7974 0.58 (L)  0.61 - 1.12 ng/dL Final     Vitals: Blood pressure 95/63, pulse 69, temperature 97.9 F (36.6 C), temperature source Oral, resp. rate 18, height 5' 9 (1.753 m), weight 70.3 kg, SpO2 100%.    Oliva [name] Salmon, DO

## 2024-03-06 NOTE — BH Assessment (Signed)
.(  Sleep Hours) - 7.25 (Any PRNs that were needed, meds refused, or side effects to meds)- None (Any disturbances and when (visitation, over night)- None (Concerns raised by the patient)- None (SI/HI/AVH)- Denies

## 2024-03-06 NOTE — Group Note (Signed)
 Recreation Therapy Group Note   Group Topic:Communication  Group Date: 03/06/2024 Start Time: 0930 End Time: 1000 Facilitators: Salihah Peckham-McCall, LRT,CTRS Location: [address] Dayroom   Group Topic: Communication, Team Building, Problem Solving  Goal Area(s) Addresses:  Patient will effectively work with peer towards shared goal.  Patient will identify skills used to make activity successful.  Patient will identify how skills used during activity can be applied to reach post d/c goals.   Behavioral Response:   Intervention: STEM Activity- Glass blower/designer  Activity: Tallest [employer]. In teams of 5-6, patients were given 11 craft pipe cleaners. Using the materials provided, patients were instructed to compete again the opposing team(s) to build the tallest free-standing structure from floor level. The activity was timed; difficulty increased by Clinical research associate as Production designer, theatre/television/film continued.  Systematically resources were removed with additional directions for example, placing one arm behind their back, working in silence, and shape stipulations. LRT facilitated post-activity discussion reviewing team processes and necessary communication skills involved in completion. Patients were encouraged to reflect how the skills utilized, or not utilized, in this activity can be incorporated to positively impact support systems post discharge.  Education: Pharmacist, community, Scientist, physiological, Discharge Planning   Education Outcome: Acknowledges education/In group clarification offered/Needs additional education.    Affect/Mood: N/A   Participation Level: Did not attend    Clinical Observations/Individualized Feedback:      Plan: Continue to engage patient in RT group sessions 2-3x/week.   Chastity Noland-McCall, LRT,CTRS  03/06/2024 12:20 PM

## 2024-03-06 NOTE — Plan of Care (Signed)

## 2024-03-07 NOTE — BHH Counselor (Signed)
 CSW received a written note from provider to aid in patient's transition from [facility] to [facility] for substance use treatment. CSW faxed referral on 9/16. This Clinical research associate will call [facility] tomorrow for an update related to patient's admission.  Marvella Jenning, LCSWA 03/07/24 4:23PM

## 2024-03-07 NOTE — Progress Notes (Signed)
   03/06/24 2125  Psych Admission Type (Psych Patients Only)  Admission Status Voluntary  Psychosocial Assessment  Patient Complaints None  Eye Contact Fair  Facial Expression Animated  Affect Appropriate to circumstance  Speech Logical/coherent  Interaction Assertive  Motor Activity Other (Comment) (WNL)  Appearance/Hygiene Unremarkable  Behavior Characteristics Appropriate to situation  Mood Pleasant  Thought Process  Coherency WDL  Content WDL  Delusions None reported or observed  Perception WDL  Hallucination None reported or observed  Judgment Limited  Confusion None  Danger to Self  Current suicidal ideation?  (Denies)  Agreement Not to Harm Self Yes  Description of Agreement Notify Staff  Danger to Others  Danger to Others None reported or observed

## 2024-03-07 NOTE — Group Note (Signed)
 Recreation Therapy Group Note   Group Topic:Animal Assisted Therapy   Group Date: 03/07/2024 Start Time: 0947 End Time: 1030 Facilitators: Nyah Shepherd-McCall, LRT,CTRS Location: [address] Dayroom   Animal-Assisted Activity (AAA) Program Checklist/Progress Notes Patient Eligibility Criteria Checklist & Daily Group note for Rec Tx Intervention  AAA/T Program Assumption of Risk Form signed by Patient/ or Parent Legal Guardian Yes  Patient understands his/her participation is voluntary Yes  Behavioral Response:    Education: Charity fundraiser, Appropriate Animal Interaction   Education Outcome: Acknowledges education.    Affect/Mood: N/A   Participation Level: Did not attend    Clinical Observations/Individualized Feedback:      Plan: Continue to engage patient in RT group sessions 2-3x/week.   Enda Santo-McCall, LRT,CTRS 03/07/2024 12:00 PM

## 2024-03-07 NOTE — BHH Group Notes (Signed)
 Psychoeducational Group Note  Date:  03/07/2024 Time:  2112  Group Topic/Focus:  Wrap-Up Group:   The focus of this group is to help patients review their daily goal of treatment and discuss progress on daily workbooks.  Participation Level: Did Not Attend  Participation Quality:  Not Applicable  Affect:  Not Applicable  Cognitive:  Not Applicable  Insight:  Not Applicable  Engagement in Group: Not Applicable  Additional Comments:  The patient did not attend group since he is being quarantined to his room.   Laniyah Rosenwald S 03/07/2024, 9:13 PM

## 2024-03-07 NOTE — Group Note (Signed)
 Date:  03/07/2024 Time:  5:03 PM  Group Topic/Focus:  Emotional Education:   The focus of this group is to discuss what feelings/emotions are, and how they are experienced.    Participation Level:  Did Not Attend   Annalee Larch 03/07/2024, 5:03 PM

## 2024-03-07 NOTE — Plan of Care (Signed)
  Problem: Education: Goal: Emotional status will improve Outcome: Progressing Goal: Verbalization of understanding the information provided will improve Outcome: Progressing   Problem: Coping: Goal: Ability to verbalize frustrations and anger appropriately will improve Outcome: Progressing   Problem: Health Behavior/Discharge Planning: Goal: Compliance with treatment plan for underlying cause of condition will improve Outcome: Progressing   Problem: Physical Regulation: Goal: Ability to maintain clinical measurements within normal limits will improve Outcome: Progressing   Problem: Safety: Goal: Periods of time without injury will increase Outcome: Progressing

## 2024-03-07 NOTE — Group Note (Signed)
 LCSW Group Therapy Note   Group Date: 03/07/2024 Start Time: 1100 End Time: 1200   Participation:  did not attend (patient was unable to attend because he was sick)  Type of Therapy:  Group Therapy  Topic:  Healing From Within: Understanding Our Past, Building Our Future"  Objective:  To help participants understand the impact of early experiences on mental and physical health, with a focus on Adverse Childhood Experiences (ACEs), and to explore ways to build resilience and healing.  Goals: Understand ACEs and Their Impact: Learn how childhood experiences shape mental and physical health. Build Resilience: Develop strategies for overcoming challenges and creating positive change. Promote Healing: Recognize the value of support and the possibility of healing through therapy and self-care.  Summary:  In today's session, we discussed how early experiences, especially ACEs, impact mental and physical health. We explored the effects of stress, abuse, and neglect on brain development and well-being. The group focused on resilience, understanding that healing and positive change are possible with support and self-awareness.  Therapeutic Modalities Used: Psychoeducation: Sharing information about ACEs and their effects. Cognitive Behavioral Therapy (CBT): Helping reframe negative thought patterns. Trauma-Informed Therapy: Creating a safe, supportive space for healing.   Kariana Wiles O Jacarie Pate, LCSWA 03/07/2024  12:25 PM

## 2024-03-07 NOTE — Progress Notes (Signed)
 [facility] Inpatient Psychiatry Progress Note  Date: 03/07/24 Patient: Richard Moon MRN: [id number]  Assessment and Plan: Richard Moon is a 63 y.o. male admitted for suicidal ideation in the context of homelessness and drug abuse (cocaine). He has a history of cocaine abuse and depression.   # Cocaine dependence with cocaine-induced mood disorder (HCC) - Sertraline  50 mg daily - Recommendation is for inpatient SUD w/ [facility]  # Hypothyroidism - Synthroid  25 mcg daily  Risk Assessment - Moderate  Discharge Planning Barriers to discharge: Unable to go to SUD treatment due to COVID positive test on 9th. Estimated length of stay: TBD Predicted Discharge location: Inpatient rehab     Interval History and update: Chart reviewed. No significant events overnight. Patient continues to be isolated in his room due to recent COVID positive test. He denies any SI today, and states that his mood has been up and down. He reported to staff that he has been experiencing intermittent SI. He continues to desire to engage in inpatient SUD treatment, specifically for cocaine. We discussed his cocaine use. He reports consistent problematic use over at least the past six months, spending beyond his means daily on it, he cannot control his use, experiences withdrawal symptoms when not using, and cravings.       Physical Exam MSK/Neuro - Normal gait and station Mental Status Exam Appearance - casually dressed, lying in bed Attitude - Calm, polite, not guarded Speech - normal volume, prosody, inflection Mood - alright Affect - Restricted Thought Process - LLGD Thought Content - No delusional TC expressed SI/HI - Denies SI today Perceptions - Denies AVH; not RIS Judgement/Insight - Fair Fund of knowledge - WNL Language - No impairments      Lab Results:  Admission on 03/01/2024  Component Date Value Ref Range Status   Folate 03/02/2024 11.3  >5.9 ng/mL Final    Hgb A1c MFr Bld 03/02/2024 5.9 (H)  4.8 - 5.6 % Final   Mean Plasma Glucose 03/02/2024 122.63  mg/dL Final   Cholesterol 90/88/7974 179  0 - 200 mg/dL Final   Triglycerides 90/88/7974 168 (H)  <150 mg/dL Final   HDL 90/88/7974 45  >40 mg/dL Final   Total CHOL/HDL Ratio 03/02/2024 4.0  RATIO Final   VLDL 03/02/2024 34  0 - 40 mg/dL Final   LDL Cholesterol 03/02/2024 100 (H)  0 - 99 mg/dL Final   RPR Ser Ql 90/88/7974 NON REACTIVE  NON REACTIVE Final   TSH 03/02/2024 0.316 (L)  0.350 - 4.500 uIU/mL Final   Vitamin B-12 03/02/2024 533  180 - 914 pg/mL Final   Vit D, 25-Hydroxy 03/02/2024 25.23 (L)  30 - 100 ng/mL Final   HIV Screen 4th Generation wRfx 03/02/2024 Non Reactive  Non Reactive Final   T3, Total 03/03/2024 98  71 - 180 ng/dL Final   Free T4 90/87/7974 0.58 (L)  0.61 - 1.12 ng/dL Final     Vitals: Blood pressure 95/63, pulse 69, temperature 97.9 F (36.6 C), temperature source Oral, resp. rate 18, height 5' 9 (1.753 m), weight 70.3 kg, SpO2 100%.    Richard [name] Salmon, DO

## 2024-03-07 NOTE — BH Assessment (Signed)
(  Sleep Hours) - 7.25 (Any PRNs that were needed, meds refused, or side effects to meds)- Trazodone  50 mg PO (Any disturbances and when (visitation, over night)- None (Concerns raised by the patient)- None (SI/HI/AVH)- Denies

## 2024-03-07 NOTE — Plan of Care (Signed)
   Problem: Education: Goal: Knowledge of [place] [facility] Education information/materials will improve Outcome: Progressing Goal: Emotional status will improve Outcome: Progressing Goal: Mental status will improve Outcome: Progressing Goal: Verbalization of understanding the information provided will improve Outcome: Progressing

## 2024-03-07 NOTE — Progress Notes (Signed)
   03/07/24 2200  Psych Admission Type (Psych Patients Only)  Admission Status Voluntary  Psychosocial Assessment  Patient Complaints Loneliness  Eye Contact Fair  Facial Expression Animated  Affect Appropriate to circumstance  Speech Logical/coherent  Interaction Minimal;Other (Comment) (quaratine)  Motor Activity Other (Comment) (WNL)  Appearance/Hygiene Unremarkable  Behavior Characteristics Appropriate to situation  Mood Pleasant  Thought Process  Coherency WDL  Content Ambivalence  Delusions None reported or observed  Perception WDL  Hallucination None reported or observed  Judgment Limited  Confusion None  Danger to Self  Current suicidal ideation? Denies  Agreement Not to Harm Self Yes  Description of Agreement Verbal  Danger to Others  Danger to Others None reported or observed

## 2024-03-07 NOTE — Progress Notes (Signed)
   03/07/24 0900  Psych Admission Type (Psych Patients Only)  Admission Status Voluntary  Psychosocial Assessment  Patient Complaints None  Eye Contact Fair  Facial Expression Animated  Affect Appropriate to circumstance  Speech Logical/coherent  Interaction Assertive  Motor Activity Other (Comment) (WNL)  Appearance/Hygiene Unremarkable  Behavior Characteristics Appropriate to situation  Mood Pleasant  Thought Process  Coherency WDL  Content WDL  Delusions None reported or observed  Perception WDL  Hallucination None reported or observed  Judgment Limited  Confusion None  Danger to Self  Current suicidal ideation? Denies  Agreement Not to Harm Self Yes  Description of Agreement verbal  Danger to Others  Danger to Others None reported or observed

## 2024-03-08 ENCOUNTER — Encounter (HOSPITAL_COMMUNITY): Payer: Self-pay

## 2024-03-08 LAB — SARS CORONAVIRUS 2 BY RT PCR: SARS Coronavirus 2 by RT PCR: POSITIVE — AB

## 2024-03-08 NOTE — BHH Suicide Risk Assessment (Signed)
 BHH INPATIENT:  Family/Significant Other Suicide Prevention Education  Suicide Prevention Education:  Patient Refusal for Family/Significant Other Suicide Prevention Education: The patient Richard Moon has refused to provide written consent for family/significant other to be provided Family/Significant Other Suicide Prevention Education during admission and/or prior to discharge.  Physician notified.  Alexandre Faries M Arnell Slivinski, LCSWA 03/08/2024, 8:12 AM

## 2024-03-08 NOTE — BH IP Treatment Plan (Signed)
 Interdisciplinary Treatment and Diagnostic Plan Update  03/08/2024 Time of Session: 12:10 PM - UPDATE Richard Moon MRN: [id number]  Principal Diagnosis: Cocaine dependence with cocaine-induced mood disorder (HCC)  Secondary Diagnoses: Principal Problem:   Cocaine dependence with cocaine-induced mood disorder (HCC) Active Problems:   MDD (major depressive disorder), severe (HCC)   Current Medications:  Current Facility-Administered Medications  Medication Dose Route Frequency Provider Last Rate Last Admin   acetaminophen  (TYLENOL ) tablet 650 mg  650 mg Oral Q6H PRN Coleman, Carolyn H, NP       alum & mag hydroxide-simeth (MAALOX/MYLANTA) 200-200-20 MG/5ML suspension 30 mL  30 mL Oral Q4H PRN Coleman, Carolyn H, NP   30 mL at 03/07/24 2311   hydrOXYzine  (ATARAX ) tablet 25 mg  25 mg Oral TID PRN Mardy Elveria DEL, NP       levothyroxine  (SYNTHROID ) tablet 25 mcg  25 mcg Oral Q0600 Parker, Alvin S, MD   25 mcg at 03/08/24 0612   magnesium  hydroxide (MILK OF MAGNESIA) suspension 30 mL  30 mL Oral Daily PRN Mardy Elveria DEL, NP       nicotine  (NICODERM CQ  - dosed in mg/24 hours) patch 21 mg  21 mg Transdermal Daily Coleman, Carolyn H, NP       OLANZapine  (ZYPREXA ) injection 5 mg  5 mg Intramuscular TID PRN Coleman, Carolyn H, NP       OLANZapine  zydis (ZYPREXA ) disintegrating tablet 5 mg  5 mg Oral TID PRN Mardy Elveria DEL, NP       sertraline  (ZOLOFT ) tablet 50 mg  50 mg Oral Daily Coleman, Carolyn H, NP   50 mg at 03/08/24 0743   traZODone  (DESYREL ) tablet 50 mg  50 mg Oral QHS Coleman, Carolyn H, NP   50 mg at 03/07/24 2111   Vitamin D  (Ergocalciferol ) (DRISDOL ) 1.25 MG (50000 UNIT) capsule 50,000 Units  50,000 Units Oral Daily Parker, Alvin S, MD   50,000 Units at 03/08/24 0743   PTA Medications: Medications Prior to Admission  Medication Sig Dispense Refill Last Dose/Taking   sertraline  (ZOLOFT ) 50 MG tablet Take 1 tablet (50 mg total) by mouth daily. 30 tablet 0    traZODone   (DESYREL ) 50 MG tablet Take 1 tablet (50 mg total) by mouth at bedtime. 30 tablet 0     Patient Stressors:    Patient Strengths:    Treatment Modalities: Medication Management, Group therapy, Case management,  1 to 1 session with clinician, Psychoeducation, Recreational therapy.   Physician Treatment Plan for Primary Diagnosis: Cocaine dependence with cocaine-induced mood disorder (HCC) Long Term Goal(s):     Short Term Goals:    Medication Management: Evaluate patient's response, side effects, and tolerance of medication regimen.  Therapeutic Interventions: 1 to 1 sessions, Unit Group sessions and Medication administration.  Evaluation of Outcomes: Progressing  Physician Treatment Plan for Secondary Diagnosis: Principal Problem:   Cocaine dependence with cocaine-induced mood disorder (HCC) Active Problems:   MDD (major depressive disorder), severe (HCC)  Long Term Goal(s):     Short Term Goals:       Medication Management: Evaluate patient's response, side effects, and tolerance of medication regimen.  Therapeutic Interventions: 1 to 1 sessions, Unit Group sessions and Medication administration.  Evaluation of Outcomes: Progressing   RN Treatment Plan for Primary Diagnosis: Cocaine dependence with cocaine-induced mood disorder (HCC) Long Term Goal(s): Knowledge of disease and therapeutic regimen to maintain health will improve  Short Term Goals: Ability to remain free from injury will improve, Ability to verbalize frustration  and anger appropriately will improve, Ability to demonstrate self-control, Ability to participate in decision making will improve, Ability to verbalize feelings will improve, Ability to disclose and discuss suicidal ideas, Ability to identify and develop effective coping behaviors will improve, and Compliance with prescribed medications will improve  Medication Management: RN will administer medications as ordered by provider, will assess and evaluate  patient's response and provide education to patient for prescribed medication. RN will report any adverse and/or side effects to prescribing provider.  Therapeutic Interventions: 1 on 1 counseling sessions, Psychoeducation, Medication administration, Evaluate responses to treatment, Monitor vital signs and CBGs as ordered, Perform/monitor CIWA, COWS, AIMS and Fall Risk screenings as ordered, Perform wound care treatments as ordered.  Evaluation of Outcomes: Progressing   LCSW Treatment Plan for Primary Diagnosis: Cocaine dependence with cocaine-induced mood disorder (HCC) Long Term Goal(s): Safe transition to appropriate next level of care at discharge, Engage patient in therapeutic group addressing interpersonal concerns.  Short Term Goals: Engage patient in aftercare planning with referrals and resources, Increase social support, Increase ability to appropriately verbalize feelings, Increase emotional regulation, Facilitate acceptance of mental health diagnosis and concerns, Facilitate patient progression through stages of change regarding substance use diagnoses and concerns, Identify triggers associated with mental health/substance abuse issues, and Increase skills for wellness and recovery  Therapeutic Interventions: Assess for all discharge needs, 1 to 1 time with Social worker, Explore available resources and support systems, Assess for adequacy in community support network, Educate family and significant other(s) on suicide prevention, Complete Psychosocial Assessment, Interpersonal group therapy.  Evaluation of Outcomes: Progressing   Progress in Treatment: Attending groups: No   Participating in groups: No. Taking medication as prescribed: Yes. Toleration medication: Yes. Family/Significant other contact made: patient declined to provide consents Patient understands diagnosis: Yes. Discussing patient identified problems/goals with staff: Yes. Medical problems stabilized or  resolved: Yes. Denies suicidal/homicidal ideation: Yes. Issues/concerns per patient self-inventory: No.   New problem(s) identified:  No   New Short Term/Long Term Goal(s):   medication stabilization, elimination of SI thoughts, development of comprehensive mental wellness plan.      Patient Goals:  I want to go to a drug treatment program to get the help I need.    Discharge Plan or Barriers:  Patient recently admitted. CSW will continue to follow and assess for appropriate referrals and possible discharge planning.      Reason for Continuation of Hospitalization: Hallucinations Medication stabilization Suicidal ideation   Estimated Length of Stay:  4 - 6 days  Last 3 [place] Suicide Severity Risk Score: Flowsheet Row [facility] 02/29/2024 in [facility] Admission (Discharged) from 01/22/2024 in [facility]  C-SSRS RISK CATEGORY High Risk High Risk High Risk    Last PHQ 2/9 Scores:    06/08/2023    8:00 AM 06/07/2023    2:09 PM 06/05/2023    4:55 AM  Depression screen PHQ 2/9  Decreased Interest 2 2 2   Down, Depressed, Hopeless 2 2 2   PHQ - 2 Score 4 4 4   Altered sleeping 1 1 1   Tired, decreased energy 1 1 1   Change in appetite 1 1 1   Feeling bad or failure about yourself  3 3 3   Trouble concentrating 1 1 1   Moving slowly or fidgety/restless 0 0 0  Suicidal thoughts 1 1 1   PHQ-9 Score 12 12 12   Difficult doing work/chores  Very difficult    Scribe for Treatment Team: Mena Simonis O Mykala Mccready, LCSWA 03/08/2024 6:32 PM

## 2024-03-08 NOTE — Group Note (Signed)
 Date:  03/08/2024 Time:  6:17 PM  Group Topic/Focus:  Developing a Wellness Toolbox:   The focus of this group is to help patients develop a wellness toolbox with skills and strategies to promote recovery upon discharge.    Participation Level:  Did Not Attend    Annalee Larch 03/08/2024, 6:17 PM

## 2024-03-08 NOTE — Progress Notes (Signed)

## 2024-03-08 NOTE — Progress Notes (Signed)
(  Sleep Hours) - 8.5  (Any PRNs that were needed, meds refused, or side effects to meds)- maalox, no meds refused, no side effects to meds  (Any disturbances and when (visitation, over night)- n/a  (Concerns raised by the patient)- n/a  (SI/HI/AVH)- denies

## 2024-03-08 NOTE — Group Note (Unsigned)
 Date:  03/08/2024 Time:  9:50 AM  Group Topic/Focus:  Goals Group:   The focus of this group is to help patients establish daily goals to achieve during treatment and discuss how the patient can incorporate goal setting into their daily lives to aide in recovery.     Participation Level:  {BHH PARTICIPATION OZCZO:77735}  Participation Quality:  {BHH PARTICIPATION QUALITY:22265}  Affect:  {BHH AFFECT:22266}  Cognitive:  {BHH COGNITIVE:22267}  Insight: {BHH Insight2:20797}  Engagement in Group:  {BHH ENGAGEMENT IN HMNLE:77731}  Modes of Intervention:  {BHH MODES OF INTERVENTION:22269}  Additional Comments:  ***  Nat Rummer 03/08/2024, 9:50 AM

## 2024-03-08 NOTE — Plan of Care (Signed)
   Problem: Education: Goal: Emotional status will improve Outcome: Progressing Goal: Mental status will improve Outcome: Progressing Goal: Verbalization of understanding the information provided will improve Outcome: Progressing

## 2024-03-08 NOTE — Group Note (Signed)
 Recreation Therapy Group Note   Group Topic:Leisure Education  Group Date: 03/08/2024 Start Time: 0932 End Time: 1000 Facilitators: Edla Para-McCall, LRT,CTRS Location: 300 Hall Dayroom   Group Topic: Leisure Education  Goal Area(s) Addresses:  Patient will identify positive leisure activities for use post discharge. Patient will identify at least one positive benefit of participation in leisure activities.  Patient will work effectively work with peers to keep the ball in play.  Behavioral Response:    Intervention: [employer], Music   Activity: Patients were to sit in a circle. Patients would toss a beach ball to each other keeping the ball in motion. LRT would time the group to see how long they could keep the ball moving. Patients could bounce or roll the ball but it could not come to a stop. If the ball were to stop moving, LRT would start the timer over.  Education:  Leisure Scientist, physiological, Special educational needs teacher, Teamwork, Discharge Planning  Education Outcome: Acknowledges education/In group clarification offered/Needs additional education.    Affect/Mood: N/A   Participation Level: Did not attend    Clinical Observations/Individualized Feedback:      Plan: Continue to engage patient in RT group sessions 2-3x/week.   Alphonzo Devera-McCall, LRT,CTRS 03/08/2024 12:33 PM

## 2024-03-08 NOTE — Plan of Care (Signed)
  Problem: Education: Goal: Emotional status will improve Outcome: Progressing Goal: Verbalization of understanding the information provided will improve Outcome: Progressing   Problem: Activity: Goal: Interest or engagement in activities will improve Outcome: Progressing   Problem: Coping: Goal: Ability to verbalize frustrations and anger appropriately will improve Outcome: Progressing   Problem: Health Behavior/Discharge Planning: Goal: Identification of resources available to assist in meeting health care needs will improve Outcome: Progressing

## 2024-03-08 NOTE — BHH Group Notes (Signed)
 Richard Moon did not attend AA wrqp up group.

## 2024-03-08 NOTE — Progress Notes (Signed)
 [name] Inpatient Psychiatry Progress Note  Date: 03/08/24 Patient: Richard Moon MRN: [id number]  Assessment and Plan: Richard Moon is a 63 y.o. male admitted for suicidal ideation in the context of homelessness and drug abuse (cocaine). He has a history of cocaine abuse and depression.  9/17 - Patient declined by two SUD facilities. Plan to discuss with him potential discharge to [facility] if not other options are available.   # Cocaine dependence with cocaine-induced mood disorder (HCC) - Sertraline  50 mg daily - Recommendation is for inpatient S[facility] w/ [facility]  # Hypothyroidism - Synthroid  25 mcg daily  Risk Assessment - Moderate  Discharge Planning Barriers to discharge: uncertain destination Estimated length of stay: TBD Predicted Discharge location: Inpatient rehab     Interval History and update: Chart reviewed. No significant events overnight. Patient was still COVID positive on a subsequent PCR test. He continues to be restricted to his room per hospital protocol. He denies SI and reports that he has been feeling fine. He was again rejected at [facility] and was informed that given his legal history he would not be accepted at [facility]. He denied any significant respiratory symptoms and is disappointed that there is not an identified program for him to go to. He understands that he facing discharge to homelessness at this point.      Physical Exam MSK/Neuro - Normal gait and station Mental Status Exam Appearance - casually dressed, lying in bed Attitude - Calm, polite, not guarded Speech - normal volume, prosody, inflection Mood - fine Affect - Restricted Thought Process - LLGD Thought Content - No delusional TC expressed SI/HI - Denies SI today Perceptions - Denies AVH; not RIS Judgement/Insight - Fair Fund of knowledge - WNL Language - No impairments      Lab Results:  Admission on 03/01/2024  Component Date Value Ref Range  Status   Folate 03/02/2024 11.3  >5.9 ng/mL Final   Hgb A1c MFr Bld 03/02/2024 5.9 (H)  4.8 - 5.6 % Final   Mean Plasma Glucose 03/02/2024 122.63  mg/dL Final   Cholesterol 90/88/7974 179  0 - 200 mg/dL Final   Triglycerides 90/88/7974 168 (H)  <150 mg/dL Final   HDL 90/88/7974 45  >40 mg/dL Final   Total CHOL/HDL Ratio 03/02/2024 4.0  RATIO Final   VLDL 03/02/2024 34  0 - 40 mg/dL Final   LDL Cholesterol 03/02/2024 100 (H)  0 - 99 mg/dL Final   RPR Ser Ql 90/88/7974 NON REACTIVE  NON REACTIVE Final   TSH 03/02/2024 0.316 (L)  0.350 - 4.500 uIU/mL Final   Vitamin B-12 03/02/2024 533  180 - 914 pg/mL Final   Vit D, 25-Hydroxy 03/02/2024 25.23 (L)  30 - 100 ng/mL Final   HIV Screen 4th Generation wRfx 03/02/2024 Non Reactive  Non Reactive Final   T3, Total 03/03/2024 98  71 - 180 ng/dL Final   Free T4 90/87/7974 0.58 (L)  0.61 - 1.12 ng/dL Final   SARS Coronavirus 2 by RT PCR 03/08/2024 POSITIVE (A)  NEGATIVE Final     Vitals: Blood pressure 95/63, pulse 69, temperature 97.9 F (36.6 C), temperature source Oral, resp. rate 18, height 5' 9 (1.753 m), weight 70.3 kg, SpO2 100%.    Richard [name] Salmon, DO

## 2024-03-08 NOTE — Progress Notes (Signed)
   03/08/24 0800  Psych Admission Type (Psych Patients Only)  Admission Status Voluntary  Psychosocial Assessment  Patient Complaints Loneliness;Isolation  Eye Contact Fair  Facial Expression Animated  Affect Appropriate to circumstance  Speech Logical/coherent  Interaction Assertive  Motor Activity Slow  Appearance/Hygiene Unremarkable  Behavior Characteristics Cooperative;Appropriate to situation;Unable to participate  Mood Pleasant  Thought Process  Coherency WDL  Content WDL  Delusions None reported or observed  Perception WDL  Hallucination None reported or observed  Judgment Limited  Confusion None  Danger to Self  Current suicidal ideation? Denies  Agreement Not to Harm Self Yes  Description of Agreement verbal  Danger to Others  Danger to Others None reported or observed

## 2024-03-08 NOTE — Group Note (Signed)
 Date:  03/08/2024 Time:  9:43 AM  Group Topic/Focus:  Goals Group:   The focus of this group is to help patients establish daily goals to achieve during treatment and discuss how the patient can incorporate goal setting into their daily lives to aide in recovery.    Participation Level:  Did Not Attend  Participation Quality:  na  Affect:  na  Cognitive:  na  Insight: None  Engagement in Group:  na  Modes of Intervention:  na  Additional Comments:  na  Nat Rummer 03/08/2024, 9:43 AM

## 2024-03-09 DIAGNOSIS — E039 Hypothyroidism, unspecified: Secondary | ICD-10-CM

## 2024-03-09 NOTE — Plan of Care (Signed)
   Problem: Education: Goal: Emotional status will improve Outcome: Progressing Goal: Mental status will improve Outcome: Progressing Goal: Verbalization of understanding the information provided will improve Outcome: Progressing

## 2024-03-09 NOTE — Progress Notes (Signed)
(  Sleep Hours) - 9.75 (Any PRNs that were needed, meds refused, or side effects to meds)- n/a  (Any disturbances and when (visitation, over night)- n/a  (Concerns raised by the patient)- n/a  (SI/HI/AVH)- denies

## 2024-03-09 NOTE — Group Note (Deleted)
 Date:  03/09/2024 Time:  8:30 PM  Group Topic/Focus:  Wrap-Up Group:   The focus of this group is to help patients review their daily goal of treatment and discuss progress on daily workbooks.     Participation Level:  {BHH PARTICIPATION OZCZO:77735}  Participation Quality:  {BHH PARTICIPATION QUALITY:22265}  Affect:  {BHH AFFECT:22266}  Cognitive:  {BHH COGNITIVE:22267}  Insight: {BHH Insight2:20797}  Engagement in Group:  {BHH ENGAGEMENT IN HMNLE:77731}  Modes of Intervention:  {BHH MODES OF INTERVENTION:22269}  Additional Comments:  ***  Richard Moon 03/09/2024, 8:30 PM

## 2024-03-09 NOTE — Group Note (Signed)
 LCSW Group Therapy Note   Group Date: 03/09/2024 Start Time: 1100 End Time: 1200   Participation:  did not attend (patient was sick)  Type of Therapy:  Group Therapy  Topic:  Money Matters: Ecologist, Confidence and Peace of Mind  Objective: To help participants understand the impact of financial stability on well-being through the lens of Maslow's Hierarchy of Needs and develop practical strategies for budgeting, saving, and debt repayment.  Goals: Increase awareness of spending habits and financial priorities, recognizing how money supports basic needs, security, and relationships. Develop simple budgeting and saving strategies to enhance stability and peace of mind.  Reduce financial stress by creating a realistic debt repayment plan, supporting long-term confidence and well-being.  Summary:  Participants explored how financial stability connects to basic needs, relationships, and self-esteem using Maslow's Hierarchy. They discussed budgeting, saving, and debt repayment strategies, identifying small, manageable changes. Through interactive discussion and self-reflection, they gained insight into their financial habits and created personal action steps for improvement.  Therapeutic Modalities Used: Elements of Cognitive Behavioral Therapy (CBT) - Addressing financial stress and thought patterns. Psychoeducation - Engineer, agricultural. Elements of Motivational Interviewing (MI) - Encouraging realistic, achievable changes. Group Support - Reducing shame and stress through shared experiences.   Jeweliana Dudgeon O Kamren Heskett, LCSWA 03/09/2024  4:49 PM

## 2024-03-09 NOTE — Plan of Care (Addendum)
 Pt A & O X3 with logical speech, congruent affect and fair eye contact. Denies SI, HI,A VH and pain when assessed. Report poor sleep last night due to noise in the hallway. Reports he's tired of being in his room and is ready for discharge. Remains medication compliant without adverse drug reactions. Presents asymptomatic without fever, cough when assessed earlier this shift. Vitals done, WNL. Pt's concerns validated, emotional support, reassurance and encouragement offered to pt. Safety checks maintained at Q 15 minutes intervals without issues.  Problem: Coping: Goal: Ability to verbalize frustrations and anger appropriately will improve Outcome: Progressing Goal: Ability to demonstrate self-control will improve Outcome: Progressing   Problem: Safety: Goal: Periods of time without injury will increase Outcome: Progressing

## 2024-03-09 NOTE — Group Note (Deleted)
 Date:  03/09/2024 Time:  8:41 PM  Group Topic/Focus:  Wrap-Up Group:   The focus of this group is to help patients review their daily goal of treatment and discuss progress on daily workbooks.     Participation Level:  {BHH PARTICIPATION OZCZO:77735}  Participation Quality:  {BHH PARTICIPATION QUALITY:22265}  Affect:  {BHH AFFECT:22266}  Cognitive:  {BHH COGNITIVE:22267}  Insight: {BHH Insight2:20797}  Engagement in Group:  {BHH ENGAGEMENT IN HMNLE:77731}  Modes of Intervention:  {BHH MODES OF INTERVENTION:22269}  Additional Comments:  ***  Richard Moon 03/09/2024, 8:41 PM

## 2024-03-09 NOTE — Group Note (Deleted)
 Date:  03/09/2024 Time:  9:14 PM  Group Topic/Focus:  Wrap-Up Group:   The focus of this group is to help patients review their daily goal of treatment and discuss progress on daily workbooks.     Participation Level:  {BHH PARTICIPATION OZCZO:77735}  Participation Quality:  {BHH PARTICIPATION QUALITY:22265}  Affect:  {BHH AFFECT:22266}  Cognitive:  {BHH COGNITIVE:22267}  Insight: {BHH Insight2:20797}  Engagement in Group:  {BHH ENGAGEMENT IN HMNLE:77731}  Modes of Intervention:  {BHH MODES OF INTERVENTION:22269}  Additional Comments:  ***  Richard Moon 03/09/2024, 9:14 PM

## 2024-03-09 NOTE — Group Note (Deleted)
 Date:  03/09/2024 Time:  8:55 PM  Group Topic/Focus:  Wrap-Up Group:   The focus of this group is to help patients review their daily goal of treatment and discuss progress on daily workbooks.     Participation Level:  {BHH PARTICIPATION OZCZO:77735}  Participation Quality:  {BHH PARTICIPATION QUALITY:22265}  Affect:  {BHH AFFECT:22266}  Cognitive:  {BHH COGNITIVE:22267}  Insight: {BHH Insight2:20797}  Engagement in Group:  {BHH ENGAGEMENT IN HMNLE:77731}  Modes of Intervention:  {BHH MODES OF INTERVENTION:22269}  Additional Comments:  ***  Sharyah Bostwick Dacosta 03/09/2024, 8:55 PM

## 2024-03-09 NOTE — Group Note (Unsigned)
 Date:  03/09/2024 Time:  9:35 PM  Group Topic/Focus:  Wrap-Up Group:   The focus of this group is to help patients review their daily goal of treatment and discuss progress on daily workbooks.     Participation Level:  {BHH PARTICIPATION OZCZO:77735}  Participation Quality:  {BHH PARTICIPATION QUALITY:22265}  Affect:  {BHH AFFECT:22266}  Cognitive:  {BHH COGNITIVE:22267}  Insight: {BHH Insight2:20797}  Engagement in Group:  {BHH ENGAGEMENT IN HMNLE:77731}  Modes of Intervention:  {BHH MODES OF INTERVENTION:22269}  Additional Comments:  ***  Abbrielle Batts Dacosta 03/09/2024, 9:35 PM

## 2024-03-09 NOTE — Progress Notes (Signed)
 [facility] Inpatient Psychiatry Progress Note  Date: 03/09/24 Patient: Richard Moon MRN: [id number]  Assessment and Plan: Richard Moon is a 63 y.o. male admitted for suicidal ideation in the context of homelessness and drug abuse (cocaine). He has a history of cocaine abuse and depression.  9/18 - Plan for discharge to [facility] tomorrow.   # Cocaine dependence with cocaine-induced mood disorder (HCC) - Sertraline  50 mg daily - Recommendation is for inpatient SUD w/ [facility]  # Hypothyroidism - Synthroid  25 mcg daily  Risk Assessment - Moderate  Discharge Planning Barriers to discharge: uncertain destination Estimated length of stay: DC tomorrow Predicted Discharge location: Inpatient rehab     Interval History and update: Chart reviewed. No significant events overnight. Patient continues to remain in his room. No complaints. Denied SI today. Reported his mood as fine. He expressed dissapointment that he was declined at two more facilities. We discussed a likely discharge tomorrow given lack of options and he expressed understanding of this. He is other wise without complaints.      Physical Exam MSK/Neuro - Normal gait and station Mental Status Exam Appearance - casually dressed, lying in bed Attitude - Calm, polite, not guarded Speech - normal volume, prosody, inflection Mood - okay Affect - Restricted Thought Process - LLGD Thought Content - No delusional TC expressed SI/HI - Denies SI today Perceptions - Denies AVH; not RIS Judgement/Insight - Fair Fund of knowledge - WNL Language - No impairments      Lab Results:  Admission on 03/01/2024  Component Date Value Ref Range Status   Folate 03/02/2024 11.3  >5.9 ng/mL Final   Hgb A1c MFr Bld 03/02/2024 5.9 (H)  4.8 - 5.6 % Final   Mean Plasma Glucose 03/02/2024 122.63  mg/dL Final   Cholesterol 90/88/7974 179  0 - 200 mg/dL Final   Triglycerides 90/88/7974 168 (H)  <150 mg/dL Final    HDL 90/88/7974 45  >40 mg/dL Final   Total CHOL/HDL Ratio 03/02/2024 4.0  RATIO Final   VLDL 03/02/2024 34  0 - 40 mg/dL Final   LDL Cholesterol 03/02/2024 100 (H)  0 - 99 mg/dL Final   RPR Ser Ql 90/88/7974 NON REACTIVE  NON REACTIVE Final   TSH 03/02/2024 0.316 (L)  0.350 - 4.500 uIU/mL Final   Vitamin B-12 03/02/2024 533  180 - 914 pg/mL Final   Vit D, 25-Hydroxy 03/02/2024 25.23 (L)  30 - 100 ng/mL Final   HIV Screen 4th Generation wRfx 03/02/2024 Non Reactive  Non Reactive Final   T3, Total 03/03/2024 98  71 - 180 ng/dL Final   Free T4 90/87/7974 0.58 (L)  0.61 - 1.12 ng/dL Final   SARS Coronavirus 2 by RT PCR 03/08/2024 POSITIVE (A)  NEGATIVE Final     Vitals: Blood pressure 95/63, pulse 69, temperature 97.9 F (36.6 C), temperature source Oral, resp. rate 18, height 5' 9 (1.753 m), weight 70.3 kg, SpO2 100%.    Oliva [name] Salmon, DO

## 2024-03-09 NOTE — BHH Group Notes (Signed)
 The focus of this group is to help patients establish daily goals to achieve during treatment and discuss how the patient can incorporate goal setting into their daily lives to aide in recovery.          Scale 1-10:  N/A     Goal for the day: Did not attend

## 2024-03-09 NOTE — BHH Counselor (Signed)
 CSW called [facility] [phone] as patient is interested in substance use treatment program, but unable to reach anyone. Left voicemail asking for a return call. CSW also called [facility] [phone] and left a voicemail asking for a return call.   CSW had conversation with patient who reported if he isn't able to get into a treatment program before tomorrow, he is requesting to be discharged to [facility] via taxi and will make his way to his cousin's house afterwards. Provider made aware and discussed during Progression.  Aurie Harroun, LCSWA 03/09/24 3:11PM

## 2024-03-09 NOTE — Progress Notes (Signed)
   03/09/24 0200  Psych Admission Type (Psych Patients Only)  Admission Status Voluntary  Psychosocial Assessment  Patient Complaints Loneliness;Isolation  Eye Contact Fair  Facial Expression Animated  Affect Appropriate to circumstance  Speech Logical/coherent  Interaction Assertive  Motor Activity Slow  Appearance/Hygiene Unremarkable  Behavior Characteristics Cooperative;Appropriate to situation  Mood Pleasant  Thought Process  Coherency WDL  Content WDL  Delusions None reported or observed  Perception WDL  Hallucination None reported or observed  Judgment Limited  Confusion None  Danger to Self  Current suicidal ideation? Denies  Agreement Not to Harm Self Yes  Description of Agreement VErbal  Danger to Others  Danger to Others None reported or observed

## 2024-03-09 NOTE — Group Note (Deleted)
 Date:  03/09/2024 Time:  8:27 PM  Group Topic/Focus:  Wrap-Up Group:   The focus of this group is to help patients review their daily goal of treatment and discuss progress on daily workbooks.     Participation Level:  {BHH PARTICIPATION OZCZO:77735}  Participation Quality:  {BHH PARTICIPATION QUALITY:22265}  Affect:  {BHH AFFECT:22266}  Cognitive:  {BHH COGNITIVE:22267}  Insight: {BHH Insight2:20797}  Engagement in Group:  {BHH ENGAGEMENT IN HMNLE:77731}  Modes of Intervention:  {BHH MODES OF INTERVENTION:22269}  Additional Comments:  ***  Kada Friesen Dacosta 03/09/2024, 8:27 PM

## 2024-03-10 DIAGNOSIS — F1424 Cocaine dependence with cocaine-induced mood disorder: Secondary | ICD-10-CM

## 2024-03-10 MED ORDER — SERTRALINE HCL 50 MG PO TABS: 50.0000 mg | ORAL_TABLET | Freq: Every day | ORAL | 0 refills | Status: DC

## 2024-03-10 MED ORDER — TRAZODONE HCL 50 MG PO TABS: 50.0000 mg | ORAL_TABLET | Freq: Every day | ORAL | 0 refills | Status: DC

## 2024-03-10 MED ORDER — LEVOTHYROXINE SODIUM 25 MCG PO TABS: 25.0000 ug | ORAL_TABLET | Freq: Every day | ORAL | 0 refills | Status: DC

## 2024-03-10 NOTE — Plan of Care (Signed)

## 2024-03-10 NOTE — Transportation (Signed)
 03/10/2024  Tanda Mills DOB: 07/09/1960 MRN: [id number]   RIDER WAIVER AND RELEASE OF LIABILITY  For the purposes of helping with transportation needs, [facility] partners with outside transportation providers (taxi companies, [place], Catering manager.) to give [facility] patients or other approved people the choice of on-demand rides Public librarian) to our buildings for non-emergency visits.  By using [employer], I, the person signing this document, on behalf of myself and/or any legal minors (in my care using the [employer]), agree:  Science writer given to me are supplied by independent, outside transportation providers who do not work for, or have any affiliation with, [employer]. [employer] is not a transportation company. [facility] has no control over the quality or safety of the rides I get using [employer]. [facility] has no control over whether any outside ride will happen on time or not. [facility] gives no guarantee on the reliability, quality, safety, or availability on any rides, or that no mistakes will happen. I know and accept that traveling by vehicle (car, truck, SVU, fleeta, bus, taxi, etc.) has risks of serious injuries such as disability, being paralyzed, and death. I know and agree the risk of using [employer] is mine alone, and not [employer]. [employer] are provided as is and as are available. The transportation providers are in charge for all inspections and care of the vehicles used to provide these rides. I agree not to take legal action against [facility], its agents, employees, officers, directors, representatives, insurers, attorneys, assigns, successors, subsidiaries, and affiliates at any time for any reasons related directly or indirectly to using [employer]. I also agree not to take legal action against [facility] or its affiliates for any injury, death, or damage to property caused by or related to using  [employer]. I have read this Waiver and Release of Liability, and I understand the terms used in it and their legal meaning. This Waiver is freely and voluntarily given with the understanding that my right (or any legal minors) to legal action against [facility] relating to [employer] is knowingly given up to use these services.   I attest that I read the Ride Waiver and Release of Liability to Tanda Mills, gave Mr. Sherley the opportunity to ask questions and answered the questions asked (if any). I affirm that Tanda Mills then provided consent for assistance with transportation.        Stepanie Graver, LCSWA 03/10/24 9:12am

## 2024-03-10 NOTE — Progress Notes (Signed)
  [facility] Adult Case Management Discharge Plan :  Will you be returning to the same living situation after discharge:  No. At discharge, do you have transportation home?: Yes,  [facility]. Do you have the ability to pay for your medications: No.  Release of information consent forms completed and in the chart;  Patient's signature needed at discharge.  Patient to Follow up at:  Follow-up Information     [facility] Follow up on 03/15/2024.   Why: Your hospital admission information has been sent to this provider.  Please call on 03/15/24 at 8:00 am to schedule a hospital follow up appointment, for therapy and medication management services.  * YOU MUST BRING A PHOTO ID TO BE SEEN.  Please also bring your medications with you. Contact information: [address] [place] [address] [phone]                 Next level of care provider has access to [facility] Link:no  Safety Planning and Suicide Prevention discussed: Yes,  completed with patient as he declined to provide consent to speak with family/friends.      Has patient been referred to the Quitline?: Patient refused referral for treatment  Patient has been referred for addiction treatment: Patient referred to [facility], [facility], [facility], and [facility]. Patient to follow-up with providers upon discharge. Patient also to follow-up with outpatient providers for medication management and therapy.  Maclaine Ahola M Saquan Furtick, LCSWA 03/10/2024, 9:13 AM

## 2024-03-10 NOTE — BHH Suicide Risk Assessment (Signed)
 [facility] Discharge Suicide Risk Assessment   Principal Problem: Cocaine dependence with cocaine-induced mood disorder [facility]) Discharge Diagnoses: Principal Problem:   Cocaine dependence with cocaine-induced mood disorder (HCC) Active Problems:   MDD (major depressive disorder), severe (HCC)    Demographic Factors:  Male, Living alone, and Unemployed  Loss Factors: Financial problems/change in socioeconomic status  Historical Factors: Impulsivity  Risk Reduction Factors:   NA  Continued Clinical Symptoms:  Active substance use and mood disorders  Cognitive Features That Contribute To Risk:  None    Suicide Risk:  Mild:  Suicidal ideation of limited frequency, intensity, duration, and specificity.  There are no identifiable plans, no associated intent, mild dysphoria and related symptoms, good self-control (both objective and subjective assessment), few other risk factors, and identifiable protective factors, including available and accessible social support.   Follow-up Information     [facility] Follow up on 03/15/2024.   Why: Your hospital admission information has been sent to this provider.  Please call on 03/15/24 at 8:00 am to schedule a hospital follow up appointment, for therapy and medication management services.  * YOU MUST BRING A PHOTO ID TO BE SEEN.  Please also bring your medications with you. Contact information: [address] [place] [address] [phone]                  Oliva [name] Salmon, DO 03/10/2024, 9:49 AM

## 2024-03-10 NOTE — Plan of Care (Signed)
 Nurse discussed anxiety, depression, coping skills, discharge instructions with patient.

## 2024-03-10 NOTE — Progress Notes (Signed)
 Discharge Note:  Patient discharged with taxi voucher and bus tickets.  Patient denied SI and HI.  Denied A/V hallucinations.  Denied pain.  Patient stated he received all his belongings, clothing, toiletries, etc.  Patient stated he appreciated all assistance received from [facility] staff.  All required discharge informaation given.

## 2024-03-10 NOTE — Progress Notes (Signed)
 Patient denied SI and HI, contracts for safety.  Denied A/V hallucinations.  Denied pain.  Denied any physical problems.   Medications administered per MD orders.  Emotional support and encouragement given patient. Safety maintained with 15 minute checks.

## 2024-03-10 NOTE — Progress Notes (Signed)
(  Sleep Hours) - 8.25  (Any PRNs that were needed, meds refused, or side effects to meds)- trazodone , atarax  (Any disturbances and when (visitation, over night) (Concerns raised by the patient)- none (SI/HI/AVH)- denies

## 2024-03-10 NOTE — Discharge Summary (Signed)
 Physician Discharge Summary Note  Patient:  Richard Moon is an 63 y.o., male MRN:  [id number] DOB:  July 22, 1960 Patient phone:  [phone] (home)  Patient address:   [address] [place] [address],  Total Time spent with patient: 45 minutes  Date of Admission:  03/01/2024 Date of Discharge: 03/10/2024  Reason for Admission:  Richard Moon is a 63 y.o. who  has a past medical history of Cocaine abuse (HCC), Homeless, Major depressive disorder, recurrent severe without psychotic features (HCC), and Substance induced mood disorder (HCC).  He presented to [facility] for MDD (major depressive disorder), severe (HCC).  He presented with suicidal ideation in the context of homelessness and drug abuse.  He had a similar presentation last month.  Patient is a poor historian.   On my exam the patient denies suicidal ideation today.  He reports he would like to get treatment for cocaine abuse as it is fueling depression.  He tells me that he has been homeless for about 3 months.  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  He tells me he has financial means to get a place, if he were not spending his money on crack.   The patient reports symptoms of major depressive disorder including depressed mood, anhedonia, insomnia, increased fatigue, feelings of hopelessness, helplessness, and worthlessness, feelings of guilt, decreased concentration, recurrent thoughts of death, and suicidal ideation.   Secondary gain cannot be excluded from the differential.  Principal Problem: Cocaine dependence with cocaine-induced mood disorder [facility]) Discharge Diagnoses: Principal Problem:   Cocaine dependence with cocaine-induced mood disorder (HCC) Active Problems:   MDD (major depressive disorder), severe (HCC)   Past Psychiatric History: He  has a past medical history of Cocaine abuse (HCC), Homeless, Major depressive disorder, recurrent severe without psychotic features (HCC), and  Substance induced mood disorder (HCC).   Past Medical History:  Past Medical History:  Diagnosis Date   Cocaine abuse (HCC)    Homeless    Hypothyroidism 03/04/2024   Hypovitaminosis D 03/04/2024   Major depressive disorder, recurrent severe without psychotic features (HCC)    Prediabetes 03/04/2024   Substance induced mood disorder (HCC)     Past Surgical History:  Procedure Laterality Date   HERNIA REPAIR      Hospital Course:  The patient was admitted voluntarily to inpatient psychiatry for suicidal ideation in the context of homelessness and cocaine abuse. He was incidentally noted to be COVID positive on routine PCR testing but was asymptomatic throughout this admission. He was placed in a room by himself and in isolation, per [facility] protocol. Besides this, no significant medical issues were noted nor addressed during this admission. The patient reported ongoing depressed mood and suicidal ideation for the next several days and a desire to enter into inpatient SUD treatment. However, despite being screened at multiple facilities, to include [facility] and [facility], he was declined by all. By 9/19 the patient had reported resolution of suicidal ideation but he remained depressed. He was taken off isolation and ultimately discharged to the [facility], with plans to inquire with a local friend if he could stay with them. During this admission he was started on sertraline  50 mg daily for sxs of depression and tolerated this well. He was also started on levothyroxine  25 mcg upon admission for suspected hypothyroidism given a low TSH with low FT4. On the day of admission the patient reported feeling well, continued to deny any respiratory symptoms, he was afebrile,  and he denied suicidal ideation.     Musculoskeletal: Normal gait and station  Mental Status Exam: Appearance - Casually dressed, appropriate hygiene and grooming  Attitude - Calm, polite, not guarded Speech - normal volume,  prosody, inflection Mood - Fine Affect - Restricted Thought Process - LLGD Thought Content - No delusional TC expressed SI/HI - Denies  Perceptions - Denies AVH; not RIS Judgement/Insight - Good  Fund of knowledge - WNL Language - No impairments   Physical Exam Vitals and nursing note reviewed.  HENT:     Head: Normocephalic and atraumatic.  Eyes:     Extraocular Movements: Extraocular movements intact.  Pulmonary:     Effort: Pulmonary effort is normal.  Musculoskeletal:        General: Normal range of motion.  Skin:    General: Skin is warm and dry.    Review of Systems  Constitutional: Negative.   Respiratory: Negative.    Cardiovascular: Negative.   Gastrointestinal: Negative.    Blood pressure 90/62, pulse (!) 116, temperature 98 F (36.7 C), temperature source Oral, resp. rate 18, height 5' 9 (1.753 m), weight 70.3 kg, SpO2 100%. Body mass index is 22.89 kg/m.   Social History   Tobacco Use  Smoking Status Some Days   Current packs/day: 0.25   Average packs/day: 0.3 packs/day for 20.0 years (5.0 ttl pk-yrs)   Types: Cigarettes  Smokeless Tobacco Never   Tobacco Cessation:  A prescription for an FDA-approved tobacco cessation medication was offered at discharge and the patient refused   Blood Alcohol level:  Lab Results  Component Value Date   ETH 43 (H) 02/29/2024   ETH <15 01/21/2024    Metabolic Disorder Labs:  Lab Results  Component Value Date   HGBA1C 5.9 (H) 03/02/2024   MPG 122.63 03/02/2024   MPG 125.5 02/09/2023   No results found for: PROLACTIN Lab Results  Component Value Date   CHOL 179 03/02/2024   TRIG 168 (H) 03/02/2024   HDL 45 03/02/2024   CHOLHDL 4.0 03/02/2024   VLDL 34 03/02/2024   LDLCALC 100 (H) 03/02/2024   LDLCALC 65 02/09/2023    See Psychiatric Specialty Exam and Suicide Risk Assessment completed by Attending Physician prior to discharge.  Discharge destination:  [facility]  Is patient on multiple  antipsychotic therapies at discharge:  No      Allergies as of 03/10/2024   No Known Allergies      Medication List     TAKE these medications      Indication  levothyroxine  25 MCG tablet Commonly known as: SYNTHROID  Take 1 tablet (25 mcg total) by mouth daily at 6 (six) AM. Start taking on: March 11, 2024  Indication: Underactive Thyroid    sertraline  50 MG tablet Commonly known as: ZOLOFT  Take 1 tablet (50 mg total) by mouth daily.  Indication: Major Depressive Disorder   traZODone  50 MG tablet Commonly known as: DESYREL  Take 1 tablet (50 mg total) by mouth at bedtime.  Indication: Trouble Sleeping        Follow-up Information     [facility] Follow up on 03/15/2024.   Why: Your hospital admission information has been sent to this provider.  Please call on 03/15/24 at 8:00 am to schedule a hospital follow up appointment, for therapy and medication management services.  * YOU MUST BRING A PHOTO ID TO BE SEEN.  Please also bring your medications with you. Contact information: [address] [place] [address] [phone]  Follow-up recommendations:  Activity:  As tolerated Diet:  Regular   Signed: Oliva [name] Salmon, DO 03/10/2024, 1:22 PM

## 2024-03-10 NOTE — BHH Group Notes (Signed)
 Adult Psychoeducational Group Note  Date:  03/10/2024 Time:  10:53 AM  Group Topic/Focus:  Goals Group:   The focus of this group is to help patients establish daily goals to achieve during treatment and discuss how the patient can incorporate goal setting into their daily lives to aide in recovery.  Participation Level:  Did Not Attend  Participation Quality:  na  Affect:  na  Cognitive:  na  Insight: na  Engagement in Group:  na  Modes of Intervention:  na  Additional Comments:  Pt did not attend Goals group   Mersadie Kavanaugh 03/10/2024, 10:53 AM

## 2024-03-25 LAB — AMB RESULTS CONSOLE CBG: Glucose: 137

## 2024-03-25 NOTE — Progress Notes (Signed)
 Pt screened for HTN, non fasting BS. Encouraged to follow up with [facility] for further evaluation since he has no PCP

## 2024-04-25 ENCOUNTER — Other Ambulatory Visit: Payer: Self-pay

## 2024-04-25 ENCOUNTER — Telehealth: Payer: Self-pay | Admitting: Nurse Practitioner

## 2024-04-25 DIAGNOSIS — F339 Major depressive disorder, recurrent, unspecified: Secondary | ICD-10-CM

## 2024-04-25 MED ORDER — SERTRALINE HCL 50 MG PO TABS
50.0000 mg | ORAL_TABLET | Freq: Every day | ORAL | 0 refills | Status: DC
  Filled 2024-04-25: qty 30, 30d supply, fill #0

## 2024-04-25 NOTE — Progress Notes (Signed)
 Acute Video Visit    Virtual Visit Consent:   Richard Moon, you are scheduled for a virtual visit with a [facility] provider today.     Just as with appointments in the office, your consent must be obtained to participate.  Your consent will be active for this visit and any virtual visit you may have with one of our providers in the next 365 days.     If you have a MyChart account, a copy of this consent can be sent to you electronically.  All virtual visits are billed to your insurance company just like a traditional visit in the office.    If the connection with a video visit is poor, the visit may have to be switched to a telephone visit.  With either a video or telephone visit, we are not always able to ensure that we have a secure connection.     I need to obtain your verbal consent now.   Are you willing to proceed with your visit today?    Richard Moon has provided verbal consent on 04/25/2024 for a virtual visit (video or telephone).   Lauraine Kitty, FNP  Date: 04/25/2024 10:55 AM  Subjective:     Patient ID: Richard Moon, male    DOB: 15-May-1961, 63 y.o.   MRN: [id number]  LILLETTE Lauraine Kitty, connected with  Richard Moon  ([id number], [birth year]) on 04/25/24 at 11:00 AM EST by a video-enabled telemedicine application and verified that I am speaking with the correct person using two identifiers.   Location: Patient: [facility]  Provider: Virtual Visit Location Provider: Home Office   I discussed the limitations of evaluation and management by telemedicine and the availability of in person appointments. The patient expressed understanding and agreed to proceed.     HPI Richard Moon is a 63 y.o. who identifies as a male who was assigned male at birth, and is being seen today for request of medication refill.   The patient has a significant medical history of depression and has been managed on Zoloft . He is seeking care at the [facility] today and will be established with a PCP at  [facility] and wellness next month when those services start at the [facility].   He is requesting a refill today on his Zoloft    Has a therapist at [facility] in [facility] currently      Objective:      Physical Exam Constitutional:      General: He is not in acute distress.    Appearance: Normal appearance. He is not ill-appearing.  HENT:     Nose: Nose normal.     Mouth/Throat:     Mouth: Mucous membranes are moist.  Pulmonary:     Effort: Pulmonary effort is normal.  Neurological:     Mental Status: He is alert and oriented to person, place, and time.  Psychiatric:        Mood and Affect: Mood normal.         Assessment & Plan:   Encouraged to use VPC while awaiting new appointment next month  For any acute needs   1. Depression, recurrent (Primary)  - sertraline  (ZOLOFT ) 50 MG tablet; Take 1 tablet (50 mg total) by mouth daily.  Dispense: 30 tablet; Refill: 0   Follow Up Instructions: I discussed the assessment and treatment plan with the patient. The patient was provided an opportunity to ask questions and all were answered. The patient agreed with the plan and demonstrated an understanding  of the instructions.  A copy of instructions were sent to the patient via MyChart unless otherwise noted below.     The patient was advised to call back or seek an in-person evaluation if the symptoms worsen or if the condition fails to improve as anticipated.    Lauraine Kitty, FNP  **Disclaimer: This note may have been dictated with voice recognition software. Similar sounding words can inadvertently be transcribed and this note may contain transcription errors which may not have been corrected upon publication of note.**

## 2024-04-28 ENCOUNTER — Other Ambulatory Visit: Payer: Self-pay

## 2024-05-12 NOTE — Progress Notes (Addendum)
 Pt attended 03/25/2024 screening event with BP of 113/75 and blood sugar was 137. Pt noted at event that he does not have a PCP. At event pt did indicate housing SDOH needs. Pt also noted that he is a smoker at the event.   Per initial f/u pt was reached out via phone and was left vm. Pt was mailed letter with housing resources, Get Care Now flyer, multiple community primary care clinics flyer, smoking cessation flyer, BP management flyer, & BP log.  Per chart review pt does not have a PCP, has insurance, and is a smoker. Pt does indicate food SDOH needs at this time.  06/05/2024 UPDATE: Letter came back stating the address pt provided at event is invalid. Letter was resent today, with the address that is on file in [facility]  Additional pt f/u to be scheduled at this time per health equity protocol.

## 2024-06-05 ENCOUNTER — Other Ambulatory Visit: Payer: Self-pay

## 2024-06-05 ENCOUNTER — Encounter (HOSPITAL_COMMUNITY): Payer: Self-pay

## 2024-06-05 ENCOUNTER — Emergency Department (HOSPITAL_COMMUNITY)
Admission: EM | Admit: 2024-06-05 | Discharge: 2024-06-06 | Disposition: A | Discharge status: Other Acute Inpatient Hospital | Payer: MEDICAID | Attending: Emergency Medicine | Admitting: Emergency Medicine

## 2024-06-05 DIAGNOSIS — F1410 Cocaine abuse, uncomplicated: Secondary | ICD-10-CM | POA: Diagnosis present

## 2024-06-05 DIAGNOSIS — F17210 Nicotine dependence, cigarettes, uncomplicated: Secondary | ICD-10-CM | POA: Diagnosis not present

## 2024-06-05 DIAGNOSIS — R45850 Homicidal ideations: Secondary | ICD-10-CM | POA: Diagnosis not present

## 2024-06-05 DIAGNOSIS — E039 Hypothyroidism, unspecified: Secondary | ICD-10-CM | POA: Diagnosis not present

## 2024-06-05 DIAGNOSIS — R45851 Suicidal ideations: Secondary | ICD-10-CM

## 2024-06-05 DIAGNOSIS — F332 Major depressive disorder, recurrent severe without psychotic features: Secondary | ICD-10-CM | POA: Diagnosis present

## 2024-06-05 DIAGNOSIS — F1490 Cocaine use, unspecified, uncomplicated: Secondary | ICD-10-CM | POA: Diagnosis not present

## 2024-06-05 DIAGNOSIS — Z5900 Homelessness unspecified: Secondary | ICD-10-CM | POA: Diagnosis not present

## 2024-06-05 LAB — ETHANOL: Alcohol, Ethyl (B): <15 mg/dL (ref <15)

## 2024-06-05 LAB — CBC
HCT: 43.2 % (ref 39.0–52.0)
Hemoglobin: 14.1 g/dL (ref 13.0–17.0)
MCH: 32.6 pg (ref 26.0–34.0)
MCHC: 32.6 g/dL (ref 30.0–36.0)
MCV: 100 fL (ref 80.0–100.0)
Platelets: 219 K/uL (ref 150–400)
RBC: 4.32 MIL/uL (ref 4.22–5.81)
RDW: 13.6 % (ref 11.5–15.5)
WBC: 4.1 K/uL (ref 4.0–10.5)
nRBC: 0 % (ref 0.0–0.2)

## 2024-06-05 LAB — RAPID URINE DRUG SCREEN, HOSP PERFORMED
Amphetamines: NOT DETECTED
Barbiturates: NOT DETECTED
Benzodiazepines: NOT DETECTED
Cocaine: POSITIVE — AB
Opiates: NOT DETECTED
Tetrahydrocannabinol: NOT DETECTED

## 2024-06-05 LAB — COMPREHENSIVE METABOLIC PANEL WITH GFR
ALT: 21 U/L (ref 0–44)
AST: 35 U/L (ref 15–41)
Albumin: 3.7 g/dL (ref 3.5–5.0)
Alkaline Phosphatase: 46 U/L (ref 38–126)
Anion gap: 11 (ref 5–15)
BUN: 17 mg/dL (ref 8–23)
CO2: 23 mmol/L (ref 22–32)
Calcium: 8.9 mg/dL (ref 8.9–10.3)
Chloride: 107 mmol/L (ref 98–111)
Creatinine, Ser: 1.35 mg/dL — ABNORMAL HIGH (ref 0.61–1.24)
GFR, Estimated: 60 mL/min — ABNORMAL LOW (ref >60)
Glucose, Bld: 95 mg/dL (ref 70–99)
Potassium: 3.7 mmol/L (ref 3.5–5.1)
Sodium: 141 mmol/L (ref 135–145)
Total Bilirubin: 1.3 mg/dL — ABNORMAL HIGH (ref 0.0–1.2)
Total Protein: 6.5 g/dL (ref 6.5–8.1)

## 2024-06-05 MED ORDER — LEVOTHYROXINE SODIUM 25 MCG PO TABS
25.0000 ug | ORAL_TABLET | Freq: Every day | ORAL | Status: DC
  Administered 2024-06-06: 07:00:00 25 ug via ORAL
  Filled 2024-06-05: qty 1

## 2024-06-05 MED ORDER — SERTRALINE HCL 50 MG PO TABS
50.0000 mg | ORAL_TABLET | Freq: Every day | ORAL | Status: DC
  Administered 2024-06-06: 10:00:00 50 mg via ORAL
  Filled 2024-06-05: qty 1

## 2024-06-05 MED ORDER — TRAZODONE HCL 50 MG PO TABS
50.0000 mg | ORAL_TABLET | Freq: Every day | ORAL | Status: DC
  Administered 2024-06-06: 50 mg via ORAL
  Filled 2024-06-05: qty 1

## 2024-06-05 NOTE — ED Triage Notes (Signed)
 Quick triage note: Pt reports SI/HI x 1 week, reports hx PTSD.

## 2024-06-05 NOTE — ED Notes (Signed)
 Pt belongings in locker #5 in purple.

## 2024-06-05 NOTE — ED Provider Notes (Signed)
°  [facility]-EMERGENCY DEPT [facility] Emergency Department Provider Note MRN:  [id number]  Arrival date & time: 06/05/2024     Chief Complaint   Suicidal and Homicidal   History of Present Illness   Richard Moon is a 63 y.o. year-old male presents to the ED with chief complaint of suicidal and homicidal thoughts.  States that his medications have not been working and he feels increasingly irritable.  States that he is now having thoughts of wanting to hurt himself and hurt others.  States that he has been using cocaine, and then uses alcohol to try and go to sleep.  He denies any recent illnesses.  Denies fever, cough, cold symptoms.  History provided by patient. {RB interpreter (Optional):27221}  Review of Systems  Pertinent positive and negative review of systems noted in HPI.    Physical Exam   Vitals:   06/05/24 2202  BP: (!) 150/99  Pulse: (!) 112  Resp: 16  Temp: (!) 97.5 F (36.4 C)  SpO2: 99%    CONSTITUTIONAL:  non toxic-appearing, NAD NEURO:  Alert and oriented x 3, CN 3-12 grossly intact EYES:  eyes equal and reactive ENT/NECK:  Supple, no stridor  CARDIO:  normal rate, regular rhythm, appears well-perfused  PULM:  No respiratory distress, CTAB GI/GU:  non-distended,  MSK/SPINE:  No gross deformities, no edema, moves all extremities  SKIN:  no rash, atraumatic   *Additional and/or pertinent findings included in MDM below  Diagnostic and Interventional Summary    EKG Interpretation Date/Time:    Ventricular Rate:    PR Interval:    QRS Duration:    QT Interval:    QTC Calculation:   R Axis:      Text Interpretation:         Labs Reviewed  COMPREHENSIVE METABOLIC PANEL WITH GFR - Abnormal; Notable for the following components:      Result Value   Creatinine, Ser 1.35 (*)    Total Bilirubin 1.3 (*)    GFR, Estimated 60 (*)    All other components within normal limits  RAPID URINE DRUG SCREEN, HOSP PERFORMED - Abnormal; Notable for the  following components:   Cocaine POSITIVE (*)    All other components within normal limits  ETHANOL  CBC    No orders to display    Medications  levothyroxine  (SYNTHROID ) tablet 25 mcg (has no administration in time range)  sertraline  (ZOLOFT ) tablet 50 mg (has no administration in time range)  traZODone  (DESYREL ) tablet 50 mg (has no administration in time range)     Procedures  /  Critical Care Procedures  ED Course and Medical Decision Making  I have reviewed the triage vital signs, the nursing notes, and pertinent available records from the EMR.  Social Determinants Affecting Complexity of Care: Patient has no clinically significant social determinants affecting this chief complaint.. {rbsocialsolutions:27068}  ED Course:    Medical Decision Making Patient here for SI/HI/medications not working.    Appears medically clear for TTS evaluation.  Amount and/or Complexity of Data Reviewed Labs: ordered.  Risk Prescription drug management.      {rbcpddx (Optional):29772:::1} {rbabdddx (Optional):29773:s::1}  Consultants: TTS consult pending.   Treatment and Plan: {rbadmissionvdc:27069}  {rbattending:27073}  Final Clinical Impressions(s) / ED Diagnoses  No diagnosis found.  ED Discharge Orders     None         Discharge Instructions Discussed with and Provided to Patient:   Discharge Instructions   None

## 2024-06-06 ENCOUNTER — Encounter: Payer: Self-pay | Admitting: Psychiatry

## 2024-06-06 ENCOUNTER — Inpatient Hospital Stay
Admission: RE | Admit: 2024-06-06 | Discharge: 2024-06-20 | DRG: 885 | Disposition: A | Discharge status: Home / Self Care | Payer: MEDICAID | Source: Intra-hospital | Attending: Psychiatry | Admitting: Psychiatry

## 2024-06-06 DIAGNOSIS — Z79899 Other long term (current) drug therapy: Secondary | ICD-10-CM | POA: Diagnosis not present

## 2024-06-06 DIAGNOSIS — Z5948 Other specified lack of adequate food: Secondary | ICD-10-CM

## 2024-06-06 DIAGNOSIS — F332 Major depressive disorder, recurrent severe without psychotic features: Secondary | ICD-10-CM

## 2024-06-06 DIAGNOSIS — F419 Anxiety disorder, unspecified: Secondary | ICD-10-CM | POA: Diagnosis present

## 2024-06-06 DIAGNOSIS — Z5900 Homelessness unspecified: Secondary | ICD-10-CM | POA: Diagnosis not present

## 2024-06-06 DIAGNOSIS — R7303 Prediabetes: Secondary | ICD-10-CM | POA: Diagnosis present

## 2024-06-06 DIAGNOSIS — F17210 Nicotine dependence, cigarettes, uncomplicated: Secondary | ICD-10-CM | POA: Diagnosis present

## 2024-06-06 DIAGNOSIS — F1410 Cocaine abuse, uncomplicated: Secondary | ICD-10-CM

## 2024-06-06 DIAGNOSIS — R45851 Suicidal ideations: Secondary | ICD-10-CM | POA: Diagnosis present

## 2024-06-06 DIAGNOSIS — Z560 Unemployment, unspecified: Secondary | ICD-10-CM

## 2024-06-06 DIAGNOSIS — F329 Major depressive disorder, single episode, unspecified: Secondary | ICD-10-CM | POA: Diagnosis not present

## 2024-06-06 DIAGNOSIS — Z59868 Other specified financial insecurity: Secondary | ICD-10-CM

## 2024-06-06 DIAGNOSIS — R45850 Homicidal ideations: Secondary | ICD-10-CM | POA: Diagnosis present

## 2024-06-06 DIAGNOSIS — Z5982 Transportation insecurity: Secondary | ICD-10-CM | POA: Diagnosis not present

## 2024-06-06 DIAGNOSIS — F323 Major depressive disorder, single episode, severe with psychotic features: Secondary | ICD-10-CM | POA: Diagnosis not present

## 2024-06-06 DIAGNOSIS — F322 Major depressive disorder, single episode, severe without psychotic features: Principal | ICD-10-CM | POA: Diagnosis present

## 2024-06-06 DIAGNOSIS — Z79890 Hormone replacement therapy (postmenopausal): Secondary | ICD-10-CM | POA: Diagnosis not present

## 2024-06-06 DIAGNOSIS — Z5941 Food insecurity: Secondary | ICD-10-CM

## 2024-06-06 DIAGNOSIS — Z9151 Personal history of suicidal behavior: Secondary | ICD-10-CM | POA: Diagnosis not present

## 2024-06-06 DIAGNOSIS — Z59819 Housing instability, housed unspecified: Secondary | ICD-10-CM | POA: Diagnosis not present

## 2024-06-06 DIAGNOSIS — E039 Hypothyroidism, unspecified: Secondary | ICD-10-CM | POA: Diagnosis present

## 2024-06-06 DIAGNOSIS — F333 Major depressive disorder, recurrent, severe with psychotic symptoms: Secondary | ICD-10-CM | POA: Diagnosis present

## 2024-06-06 MED ORDER — LORAZEPAM 2 MG/ML IJ SOLN: 2.0000 mg | Freq: Three times a day (TID) | INTRAMUSCULAR | Status: DC | PRN

## 2024-06-06 MED ORDER — DIPHENHYDRAMINE HCL 50 MG/ML IJ SOLN: 50.0000 mg | Freq: Three times a day (TID) | INTRAMUSCULAR | Status: DC | PRN

## 2024-06-06 MED ORDER — HYDROXYZINE HCL 50 MG PO TABS: 50.0000 mg | ORAL_TABLET | Freq: Four times a day (QID) | ORAL | Status: DC | PRN

## 2024-06-06 MED ORDER — LEVOTHYROXINE SODIUM 50 MCG PO TABS
25.0000 ug | ORAL_TABLET | Freq: Every day | ORAL | Status: DC
  Administered 2024-06-07 – 2024-06-20 (×14): 25 ug via ORAL
  Filled 2024-06-06 (×12): qty 1

## 2024-06-06 MED ORDER — SERTRALINE HCL 25 MG PO TABS
50.0000 mg | ORAL_TABLET | Freq: Every day | ORAL | Status: DC
  Administered 2024-06-07 – 2024-06-09 (×3): 50 mg via ORAL
  Filled 2024-06-06 (×3): qty 2

## 2024-06-06 MED ORDER — ENSURE PLUS HIGH PROTEIN PO LIQD
237.0000 mL | Freq: Two times a day (BID) | ORAL | Status: DC
  Administered 2024-06-07 (×2): 237 mL via ORAL

## 2024-06-06 MED ORDER — DIPHENHYDRAMINE HCL 25 MG PO CAPS: 50.0000 mg | ORAL_CAPSULE | Freq: Three times a day (TID) | ORAL | Status: DC | PRN

## 2024-06-06 MED ORDER — DIAZEPAM 5 MG/ML IJ SOLN: 10.0000 mg | Freq: Four times a day (QID) | INTRAMUSCULAR | Status: DC | PRN

## 2024-06-06 MED ORDER — HALOPERIDOL LACTATE 5 MG/ML IJ SOLN: 10.0000 mg | Freq: Three times a day (TID) | INTRAMUSCULAR | Status: DC | PRN

## 2024-06-06 MED ORDER — ACETAMINOPHEN 325 MG PO TABS
650.0000 mg | ORAL_TABLET | Freq: Four times a day (QID) | ORAL | Status: DC | PRN
  Administered 2024-06-06 – 2024-06-19 (×13): 650 mg via ORAL
  Filled 2024-06-06 (×11): qty 2

## 2024-06-06 MED ORDER — NICOTINE POLACRILEX 2 MG MT GUM
2.0000 mg | CHEWING_GUM | OROMUCOSAL | Status: DC | PRN
  Administered 2024-06-07: 08:00:00 2 mg via ORAL
  Filled 2024-06-06 (×2): qty 1

## 2024-06-06 MED ORDER — MAGNESIUM HYDROXIDE 400 MG/5ML PO SUSP: 30.0000 mL | Freq: Every day | ORAL | Status: DC | PRN

## 2024-06-06 MED ORDER — HYDROXYZINE HCL 50 MG PO TABS
50.0000 mg | ORAL_TABLET | Freq: Four times a day (QID) | ORAL | Status: DC | PRN
  Administered 2024-06-06 – 2024-06-17 (×6): 50 mg via ORAL
  Filled 2024-06-06 (×7): qty 1

## 2024-06-06 MED ORDER — ZIPRASIDONE MESYLATE 20 MG IM SOLR: 10.0000 mg | Freq: Four times a day (QID) | INTRAMUSCULAR | Status: DC | PRN

## 2024-06-06 MED ORDER — ALUM & MAG HYDROXIDE-SIMETH 200-200-20 MG/5ML PO SUSP: 30.0000 mL | ORAL | Status: DC | PRN

## 2024-06-06 MED ORDER — NICOTINE 14 MG/24HR TD PT24
14.0000 mg | MEDICATED_PATCH | Freq: Every day | TRANSDERMAL | Status: DC
  Administered 2024-06-06 – 2024-06-08 (×2): 14 mg via TRANSDERMAL
  Filled 2024-06-06 (×5): qty 1

## 2024-06-06 MED ORDER — TRAZODONE HCL 50 MG PO TABS
50.0000 mg | ORAL_TABLET | Freq: Every day | ORAL | Status: DC
  Administered 2024-06-06: 21:00:00 50 mg via ORAL
  Filled 2024-06-06: qty 1

## 2024-06-06 MED ORDER — HALOPERIDOL LACTATE 5 MG/ML IJ SOLN: 5.0000 mg | Freq: Three times a day (TID) | INTRAMUSCULAR | Status: DC | PRN

## 2024-06-06 MED ORDER — HALOPERIDOL 5 MG PO TABS: 5.0000 mg | ORAL_TABLET | Freq: Three times a day (TID) | ORAL | Status: DC | PRN

## 2024-06-06 NOTE — ED Notes (Signed)
 VOL

## 2024-06-06 NOTE — Group Note (Signed)
 Recreation Therapy Group Note   Group Topic:Health and Wellness  Group Date: 06/06/2024 Start Time: 1515 End Time: 1600 Facilitators: Celestia Jeoffrey BRAVO, LRT, CTRS Location: Courtyard  Group Description: [employer]. LRT and patients played games of basketball, drew with chalk, and played corn hole while outside in the courtyard while getting fresh air and sunlight. Music was being played in the background. LRT and peers conversed about different games they have played before, what they do in their free time and anything else that is on their minds. LRT encouraged pts to drink water after being outside, sweating and getting their heart rate up.  Goal Area(s) Addressed: Patient will build on frustration tolerance skills. Patients will partake in a competitive play game with peers. Patients will gain knowledge of new leisure interest/hobby.    Affect/Mood: N/A   Participation Level: Did not attend    Clinical Observations/Individualized Feedback: Patient did not attend due to being a new admission.   Plan: Continue to engage patient in RT group sessions 2-3x/week.   Jeoffrey BRAVO Celestia, LRT, CTRS 06/06/2024 4:54 PM

## 2024-06-06 NOTE — BH Assessment (Signed)
 Patient was deferred to IRIS for a telepsych assessment. The assigned care coordinator will provide updates regarding the scheduling of the assessment. IRIS coordinator can be reached at [phone] for further information on the timing of the telepsych evaluation.

## 2024-06-06 NOTE — Progress Notes (Signed)
°   06/06/24 1500  Charting Type  Charting Type Admission  Focused Reassessment No Changes Psychosocial  Focused Reassessment Changes Noted Psychosocial  Safety Check Verification  Has the RN verified the 15 minute safety check completion? Yes  Neurological  Neuro (WDL) WDL  HEENT  HEENT (WDL) X  Teeth Missing (Comment) (TOP AND BOTTOM; CAP SEEN)  Tongue Pink;Moist  Mucous Membrane(s) Pink;Moist  Voice Clear;Other (Comment) (LOW VOLUME)  Respiratory  Respiratory (WDL) WDL  Cardiac  Cardiac (WDL) WDL  Vascular  Vascular (WDL) WDL  Integumentary  Integumentary (WDL) X  Staff Member Assisting with Skin Assessment on Admission Reggie King MHT  Skin Color Appropriate for ethnicity  Skin Condition Dry;Other (Comment) (Large golf ball sized discolored scar from spider bite per pt)  Skin Integrity Other (Comment) (See above)  Skin Turgor Non-tenting  Braden Scale (Ages 8 and up)  Sensory Perceptions 4  Moisture 4  Activity 4  Mobility 4  Nutrition 2  Friction and Shear 3  Braden Scale Score 21  Musculoskeletal  Musculoskeletal (WDL) WDL  Gastrointestinal  Gastrointestinal (WDL) WDL  Last BM Date  06/05/24  GU Assessment  Genitourinary (WDL) WDL  Genitalia  Male Genitalia Intact  Neurological  Level of Consciousness Alert

## 2024-06-06 NOTE — Plan of Care (Signed)

## 2024-06-06 NOTE — Group Note (Signed)
 Date:  06/06/2024 Time:  8:44 PM  Group Topic/Focus:  Orientation:   The focus of this group is to educate the patient on the purpose and policies of crisis stabilization and provide a format to answer questions about their admission.  The group details unit policies and expectations of patients while admitted. Wrap-Up Group:   The focus of this group is to help patients review their daily goal of treatment and discuss progress on daily workbooks.    Participation Level:  Active  Participation Quality:  Appropriate and Attentive  Affect:  Appropriate  Cognitive:  Alert and Appropriate  Insight: Appropriate and Good  Engagement in Group:  Engaged  Modes of Intervention:  Orientation  Additional Comments:     Arlester CHRISTELLA Servant 06/06/2024, 8:44 PM

## 2024-06-06 NOTE — Progress Notes (Signed)
 Pt was accepted to [facility] on 06/06/2024 Bed assignment:305   Pt meets inpatient criteria per Adriana Pontes. MD   Attending Physician will be: Dr.Jadepalle    Report can be called to: [phone]  Pt can arrive: [facility] to update   Care Team Notified:[facility] Cherylynn Ernst, RN, Damien Fireman, RN, [name], NP

## 2024-06-06 NOTE — ED Provider Notes (Signed)
 Patient accepted to [facility].  Accepting physician is Dr. Evelia.  Patient is ready for transfer, EMTALA completed.   Bari Roxie HERO, DO 06/06/24 8694

## 2024-06-06 NOTE — Consult Note (Signed)
 [facility] Consult Note  Patient Name: Richard Moon MRN: [id number] DOB: [birth year] DATE OF Consult: 06/06/2024  PRIMARY PSYCHIATRIC DIAGNOSES   1.  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  Cocaine Use Disorder   RECOMMENDATIONS  Recommendations: Medication recommendations: continue with current outpatient medications:  Zoloft , 50 mg every day for depression/anxiety; trazodone , 50 mg at bedtime for depression/anxiety/sleep.  PRN's:  hydroxyzine , 50 mg q6h PRN anxiety; for severe agitation/aggression:  Geodon  10 mg IM q6h PRN and Valium  10 mg IM q6h PRN.  Non-Medication/therapeutic recommendations: Patient continues to have suicidal ideation, so will continue to need close observation until he can safely be admitted to Psychiatry; continue with matter-of-fact emotional support in ED, pending transfer Is inpatient psychiatric hospitalization recommended for this patient? Yes (Explain why): Patient continues with severe depressive sx's, with suicidal ideation.  Meets criteria for inpatient stabilization Is another care setting recommended for this patient? (examples may include Crisis Stabilization Unit, Residential/Recovery Treatment, ALF/SNF, Memory Care Unit)  No (Explain why): As above From a psychiatric perspective, is this patient appropriate for discharge to an outpatient setting/resource or other less restrictive environment for continued care?  No (Explain why): AS above Follow-Up Telepsychiatry C/L services: We will sign off for now. Please re-consult our service if needed for any concerning changes in the patient's condition, discharge planning, or questions. Communication: Treatment team members (and family members if applicable) who were involved in treatment/care discussions and planning, and with whom we spoke or engaged with via secure text/chat, include the following: Secure message sent to Dr. Rogelia, ED attending, and ED staff, outlining above  recommendations.  Thank you for involving us  in the care of this patient. If you have any additional questions or concerns, please call [phone] and ask for the provider on-call.   TELEPSYCHIATRY ATTESTATION & CONSENT   As the provider for this telehealth consult, I attest that I verified the patients identity using two separate identifiers, introduced myself to the patient, provided my credentials, disclosed my location, and performed this encounter via a HIPAA-compliant, real-time, face-to-face, two-way, interactive audio and video platform and with the full consent and agreement of the patient (or guardian as applicable.)   Patient physical location: ED, [facility]. Telehealth provider physical location: home office in state of [place] .  Video start time: 0435h EST (Central Time) Video end time: 0450h EST  Total time spent in this encounter was 30 minutes, including record review, clinical interview, behavior observations, discussion of impressions and recommendations (including medications and hospitalization), and consultation/communication with relevant parties    IDENTIFYING DATA  Richard Moon is a 63 y.o. year-old male for whom a psychiatric consultation has been ordered by the primary provider. The patient was identified using two separate identifiers.  CHIEF COMPLAINT/REASON FOR CONSULT   I just can't live like this anymore.   HISTORY OF PRESENT ILLNESS (HPI)  The patient presents with long history of depression, at least since his thirties, with comorbid, chronic cocaine dependence.  Has been hospitalized multiple times for suicide attempt or suicidal ideation (most recently September 2025), and he has also had several admission for drug/EtOH rehab.  Patient released from hospital in late September, and as he had no stable place to go, he ended up back with his girlfriend, who is herself a cocaine addict.  He fell back into usage, and briefly did continue his  Zoloft  and trazodone , but soon stopped them.  Now again feels out of control in his usage, and his having intense thoughts  of suicide.  Last usage was only hours ago.  Only rare EtOH use, and denies cannabis or other drugs.  UDS positive only for cocaine.  BAL negative. No homicidal ideation, but some vague thoughts of huting someone.   No psychotic sx's. SABRA  PAST PSYCHIATRIC HISTORY  As above Otherwise as per HPI above.  PAST MEDICAL HISTORY  Past Medical History:  Diagnosis Date   Cocaine abuse (HCC)    Homeless    Hypothyroidism 03/04/2024   Hypovitaminosis D 03/04/2024   Major depressive disorder, recurrent severe without psychotic features (HCC)    Prediabetes 03/04/2024   Substance induced mood disorder (HCC)      HOME MEDICATIONS  Facility Ordered Medications  Medication   levothyroxine  (SYNTHROID ) tablet 25 mcg   sertraline  (ZOLOFT ) tablet 50 mg   traZODone  (DESYREL ) tablet 50 mg   PTA Medications  Medication Sig   traZODone  (DESYREL ) 50 MG tablet Take 1 tablet (50 mg total) by mouth at bedtime.   levothyroxine  (SYNTHROID ) 25 MCG tablet Take 1 tablet (25 mcg total) by mouth daily at 6 (six) AM.   sertraline  (ZOLOFT ) 50 MG tablet Take 1 tablet (50 mg total) by mouth daily.     ALLERGIES  Allergies[1]  SOCIAL & SUBSTANCE USE HISTORY  Social History   Socioeconomic History   Marital status: Single    Spouse name: Not on file   Number of children: Not on file   Years of education: Not on file   Highest education level: Not on file  Occupational History   Not on file  Tobacco Use   Smoking status: Some Days    Current packs/day: 0.25    Average packs/day: 0.3 packs/day for 20.0 years (5.0 ttl pk-yrs)    Types: Cigarettes   Smokeless tobacco: Never  Vaping Use   Vaping status: Former  Substance and Sexual Activity   Alcohol use: Yes    Comment: Occasional alcohol use   Drug use: Yes    Types: Marijuana, Cocaine    Comment: daily cocaine use (reported  $200-300/ daily); occasional cannabis use   Sexual activity: Yes    Birth control/protection: Condom  Other Topics Concern   Not on file  Social History Narrative   Not on file   Social Drivers of Health   Tobacco Use: High Risk (06/05/2024)   Patient History    Smoking Tobacco Use: Some Days    Smokeless Tobacco Use: Never    Passive Exposure: Not on file  Financial Resource Strain: Not on file  Food Insecurity: Patient Declined (03/25/2024)   Epic    Worried About Programme Researcher, Broadcasting/film/video in the Last Year: Patient declined    Barista in the Last Year: Patient declined  Recent Concern: Food Insecurity - Food Insecurity Present (01/22/2024)   Epic    Worried About Programme Researcher, Broadcasting/film/video in the Last Year: Sometimes true    Ran Out of Food in the Last Year: Sometimes true  Transportation Needs: Patient Declined (03/25/2024)   Epic    Lack of Transportation (Medical): Patient declined    Lack of Transportation (Non-Medical): Patient declined  Physical Activity: Not on file  Stress: Not on file  Social Connections: Not on file  Depression (PHQ2-9): High Risk (06/08/2023)   Depression (PHQ2-9)    PHQ-2 Score: 12  Alcohol Screen: Low Risk (03/02/2024)   Alcohol Screen    Last Alcohol Screening Score (AUDIT): 2  Housing: High Risk (03/25/2024)   Epic  Unable to Pay for Housing in the Last Year: Yes    Number of Times Moved in the Last Year: 2    Homeless in the Last Year: Yes  Utilities: Patient Declined (03/25/2024)   Epic    Threatened with loss of utilities: Patient declined  Health Literacy: Not on file   Tobacco Use History[2] Social History   Substance and Sexual Activity  Alcohol Use Yes   Comment: Occasional alcohol use   Social History   Substance and Sexual Activity  Drug Use Yes   Types: Marijuana, Cocaine   Comment: daily cocaine use (reported $200-300/ daily); occasional cannabis use      FAMILY HISTORY  History reviewed. No pertinent family  history. 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 (if known):  Did not report  MENTAL STATUS EXAM (MSE)  Mental Status Exam: General Appearance: Fairly Groomed  Orientation:  Full (Time, Place, and Person)  Memory:  Immediate;   Fair Recent;   Fair Remote;   Fair  Concentration:  Concentration: Fair and Attention Span: Fair  Recall:  Fair  Attention  Poor  Eye Contact:  Minimal  Speech:  Slow  Language:  Good  Volume:  Decreased  Mood: I'm not doing OK.  Affect:  Depressed and Flat  Thought Process:  Descriptions of Associations: Circumstantial  Thought Content:  Paranoid Ideation and Rumination  Suicidal Thoughts:  Yes.  with intent/plan  Homicidal Thoughts:  States that has had thoughts of hurting others, but no further details.  Judgement:  Poor  Insight:  Shallow  Psychomotor Activity:  Psychomotor Retardation  Akathisia:  No  Fund of Knowledge:  Fair    Assets:  Communication Skills Desire for Improvement  Cognition:  WNL  ADL's:  Intact  AIMS (if indicated):       VITALS  Blood pressure (!) 150/99, pulse (!) 112, temperature (!) 97.5 F (36.4 C), resp. rate 16, SpO2 99%.  LABS  Admission on 06/05/2024  Component Date Value Ref Range Status   Sodium 06/05/2024 141  135 - 145 mmol/L Final   Potassium 06/05/2024 3.7  3.5 - 5.1 mmol/L Final   Chloride 06/05/2024 107  98 - 111 mmol/L Final   CO2 06/05/2024 23  22 - 32 mmol/L Final   Glucose, Bld 06/05/2024 95  70 - 99 mg/dL Final   Glucose reference range applies only to samples taken after fasting for at least 8 hours.   BUN 06/05/2024 17  8 - 23 mg/dL Final   Creatinine, Ser 06/05/2024 1.35 (H)  0.61 - 1.24 mg/dL Final   Calcium 87/84/7974 8.9  8.9 - 10.3 mg/dL Final   Total Protein 87/84/7974 6.5  6.5 - 8.1 g/dL Final   Albumin 87/84/7974 3.7  3.5 - 5.0 g/dL Final   AST 87/84/7974 35  15 - 41 U/L Final   ALT 06/05/2024 21  0 - 44 U/L Final   Alkaline Phosphatase 06/05/2024 46  38 - 126 U/L Final   Total Bilirubin  06/05/2024 1.3 (H)  0.0 - 1.2 mg/dL Final   GFR, Estimated 06/05/2024 60 (L)  >60 mL/min Final   Comment: (NOTE) Calculated using the CKD-EPI Creatinine Equation (2021)    Anion gap 06/05/2024 11  5 - 15 Final   Performed at [facility], [address], [place], [place] [address]   Alcohol, Ethyl (B) 06/05/2024 <15  <15 mg/dL Final   Comment: (NOTE) For medical purposes only. Performed at [facility], [address] [place], [place] [address]  WBC 06/05/2024 4.1  4.0 - 10.5 K/uL Final   RBC 06/05/2024 4.32  4.22 - 5.81 MIL/uL Final   Hemoglobin 06/05/2024 14.1  13.0 - 17.0 g/dL Final   HCT 87/84/7974 43.2  39.0 - 52.0 % Final   MCV 06/05/2024 100.0  80.0 - 100.0 fL Final   MCH 06/05/2024 32.6  26.0 - 34.0 pg Final   MCHC 06/05/2024 32.6  30.0 - 36.0 g/dL Final   RDW 87/84/7974 13.6  11.5 - 15.5 % Final   Platelets 06/05/2024 219  150 - 400 K/uL Final   nRBC 06/05/2024 0.0  0.0 - 0.2 % Final   Performed at [facility], [address] [place], [place] [address]   Opiates 06/05/2024 NONE DETECTED  NONE DETECTED Final   Cocaine 06/05/2024 POSITIVE (A)  NONE DETECTED Final   Benzodiazepines 06/05/2024 NONE DETECTED  NONE DETECTED Final   Amphetamines 06/05/2024 NONE DETECTED  NONE DETECTED Final   Tetrahydrocannabinol 06/05/2024 NONE DETECTED  NONE DETECTED Final   Barbiturates 06/05/2024 NONE DETECTED  NONE DETECTED Final   Comment: (NOTE) DRUG SCREEN FOR MEDICAL PURPOSES ONLY.  IF CONFIRMATION IS NEEDED FOR ANY PURPOSE, NOTIFY LAB WITHIN 5 DAYS.  LOWEST DETECTABLE LIMITS FOR URINE DRUG SCREEN Drug Class                     Cutoff (ng/mL) Amphetamine and metabolites    1000 Barbiturate and metabolites    200 Benzodiazepine                 200 Opiates and metabolites        300 Cocaine and metabolites        300 THC                            50 Performed at [facility], [address] [place], [place] [address]     PSYCHIATRIC REVIEW OF  SYSTEMS (ROS)  ROS: Notable for the following relevant positive findings: Review of Systems  Constitutional: Negative.   HENT: Negative.    Eyes: Negative.   Respiratory: Negative.    Cardiovascular: Negative.   Gastrointestinal: Negative.   Genitourinary: Negative.   Musculoskeletal: Negative.   Skin: Negative.   Neurological: Negative.   Endo/Heme/Allergies: Negative.   Psychiatric/Behavioral:  Positive for depression, substance abuse and suicidal ideas. The patient is nervous/anxious.     Additional findings:      Musculoskeletal: No abnormal movements observed      Gait & Station: Laying/Sitting      Pain Screening: Denies      Nutrition & Dental Concerns: Reviewed  RISK FORMULATION/ASSESSMENT  Is the patient experiencing any suicidal or homicidal ideations: Yes       Explain if yes:   Patient continues to feel that his life is going nowhere and that he has nothing to live for.  Protective factors considered for safety management:   Patient lives in an environment where there is ready access to cocaine and has no other supports in the community.  Risk factors/concerns considered for safety management:  Prior attempt Depression Substance abuse/dependence Recent loss Access to lethal means Age over 42 Hopelessness Impulsivity Isolation Barriers to accessing treatment Male gender Unmarried  Is there a safety management plan with the patient and treatment team to minimize risk factors and promote protective factors: No           Explain: As above, patient  has no reliable community supports  Is crisis care placement or psychiatric hospitalization recommended: Yes     Based on my current evaluation and risk assessment, patient is determined at this time to be at:  High risk  *RISK ASSESSMENT Risk assessment is a dynamic process; it is possible that this patient's condition, and risk level, may change. This should be re-evaluated and managed over time as  appropriate. Please re-consult psychiatric consult services if additional assistance is needed in terms of risk assessment and management. If your team decides to discharge this patient, please advise the patient how to best access emergency psychiatric services, or to call 911, if their condition worsens or they feel unsafe in any way.   Adriana JINNY Pontes, MD Telepsychiatry Consult Services    [1] No Known Allergies [2]  Social History Tobacco Use  Smoking Status Some Days   Current packs/day: 0.25   Average packs/day: 0.3 packs/day for 20.0 years (5.0 ttl pk-yrs)   Types: Cigarettes  Smokeless Tobacco Never

## 2024-06-06 NOTE — Tx Team (Signed)
 Initial Treatment Plan 06/06/2024 4:11 PM Richard Moon FMW:[id number]    PATIENT STRESSORS: Financial difficulties   Loss of job last Friday   Occupational concerns   Substance abuse     PATIENT STRENGTHS: Ability for insight  Physical Health    PATIENT IDENTIFIED PROBLEMS: Loss of job No support system No transportation Probably will lose housing due to loss of job                     DISCHARGE CRITERIA:  Ability to meet basic life and health needs Adequate post-discharge living arrangements Improved stabilization in mood, thinking, and/or behavior Motivation to continue treatment in a less acute level of care Safe-care adequate arrangements made Verbal commitment to aftercare and medication compliance  PRELIMINARY DISCHARGE PLAN: Placement in alternative living arrangements  PATIENT/FAMILY INVOLVEMENT: This treatment plan has been presented to and reviewed with the patient, Richard Moon, and/or family member, none.  The patient and family have been given the opportunity to ask questions and make suggestions.  Jon Richard Lucks, RN 06/06/2024, 4:11 PM

## 2024-06-06 NOTE — Progress Notes (Signed)
°   06/06/24 1500  Psych Admission Type (Psych Patients Only)  Admission Status Voluntary  Psychosocial Assessment  Patient Complaints Decreased concentration;Depression;None (racing thoughts)  Eye Contact Brief  Facial Expression Sad  Affect Depressed  Speech Logical/coherent  Interaction Assertive  Motor Activity Slow  Appearance/Hygiene Unremarkable;In scrubs  Behavior Characteristics Cooperative;Appropriate to situation  Mood Depressed  Aggressive Behavior  Targets Self  Effect No apparent injury  Thought Process  Coherency WDL  Content WDL  Delusions WDL;None reported or observed  Perception WDL  Hallucination None reported or observed  Judgment Poor  Confusion WDL  Danger to Self  Current suicidal ideation? Passive  Description of Suicide Plan to step in front of bus or cut self  Self-Injurious Behavior No self-injurious ideation or behavior indicators observed or expressed   Agreement Not to Harm Self Yes  Description of Agreement Verbal  Danger to Others  Danger to Others None reported or observed

## 2024-06-06 NOTE — ED Provider Notes (Signed)
 Emergency Medicine Observation Re-evaluation Note  Richard Moon is a 64 y.o. male, seen on rounds today.  Pt initially presented to the ED for complaints of Suicidal and Homicidal Currently, the patient is resting in the room.  Physical Exam  BP 100/64 (BP Location: Left Arm)   Pulse 71   Temp 97.9 F (36.6 C) (Oral)   Resp 14   SpO2 96%  Physical Exam General: resting comfortably, NAD Lungs: normal WOB Psych: currently calm and resting  ED Course / MDM  EKG:   I have reviewed the labs performed to date as well as medications administered while in observation.  Recent changes in the last 24 hours include none.  Plan  Current plan is for inpatient psychiatric placement.    Bari Roxie HERO, [place] 06/06/24 [id number]

## 2024-06-07 DIAGNOSIS — F332 Major depressive disorder, recurrent severe without psychotic features: Secondary | ICD-10-CM | POA: Diagnosis not present

## 2024-06-07 DIAGNOSIS — F329 Major depressive disorder, single episode, unspecified: Secondary | ICD-10-CM

## 2024-06-07 MED ORDER — TRAZODONE HCL 50 MG PO TABS
25.0000 mg | ORAL_TABLET | Freq: Every evening | ORAL | Status: DC | PRN
  Administered 2024-06-07 – 2024-06-18 (×12): 25 mg via ORAL
  Filled 2024-06-07 (×12): qty 1

## 2024-06-07 MED ORDER — CHLORDIAZEPOXIDE HCL 25 MG PO CAPS: 25.0000 mg | ORAL_CAPSULE | Freq: Four times a day (QID) | ORAL | Status: AC | PRN
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
## 2024-06-07 NOTE — Plan of Care (Signed)
°  Problem: Education: Goal: Knowledge of [facility] Education information/materials will improve Outcome: Progressing Goal: Emotional status will improve 06/07/2024 1904 by Julio Dollie Bare, RN Outcome: Progressing 06/07/2024 1904 by Julio Dollie Bare, RN Outcome: Progressing 06/07/2024 1903 by Julio Dollie Bare, RN Outcome: Progressing Goal: Mental status will improve 06/07/2024 1904 by Julio Dollie Bare, RN Outcome: Progressing 06/07/2024 1903 by Julio Dollie Bare, RN Outcome: Progressing Goal: Verbalization of understanding the information provided will improve 06/07/2024 1904 by Julio Dollie Bare, RN Outcome: Progressing 06/07/2024 1903 by Julio Dollie Bare, RN Outcome: Progressing   Problem: Activity: Goal: Interest or engagement in activities will improve 06/07/2024 1904 by Julio Dollie Bare, RN Outcome: Progressing 06/07/2024 1903 by Julio Dollie Bare, RN Outcome: Progressing Goal: Sleeping patterns will improve 06/07/2024 1904 by Julio Dollie Bare, RN Outcome: Progressing 06/07/2024 1903 by Julio Dollie Bare, RN Outcome: Progressing   Problem: Coping: Goal: Ability to verbalize frustrations and anger appropriately will improve 06/07/2024 1904 by Julio Dollie Bare, RN Outcome: Progressing 06/07/2024 1903 by Julio Dollie Bare, RN Outcome: Progressing Goal: Ability to demonstrate self-control will improve 06/07/2024 1904 by Julio Dollie Bare, RN Outcome: Progressing 06/07/2024 1903 by Julio Dollie Bare, RN Outcome: Progressing

## 2024-06-07 NOTE — Progress Notes (Signed)
 Pt calm and pleasant during assessment denying SI/HI/AVH. Pt observed by this Clinical research associate interacting appropriately with staff and peers on the unit. Pt compliant with medication administration per MD orders. Pt given education, support, and encouragement to be active in his treatment plan. Pt being monitored Q 15 minutes for safety per unit protocol, remains safe on the unit

## 2024-06-07 NOTE — Group Note (Signed)
 Date:  06/07/2024 Time:  8:43 PM  Group Topic/Focus:  Wrap-Up Group:   The focus of this group is to help patients review their daily goal of treatment and discuss progress on daily workbooks.    Participation Level:  Active  Participation Quality:  Appropriate and Attentive  Affect:  Appropriate  Cognitive:  Alert and Appropriate  Insight: Appropriate and Good  Engagement in Group:  Engaged  Modes of Intervention:  Orientation  Additional Comments:     Richard Moon Servant 06/07/2024, 8:43 PM

## 2024-06-07 NOTE — BH IP Treatment Plan (Signed)
 Interdisciplinary Treatment and Diagnostic Plan Update  06/07/2024 Time of Session: 10:26 Richard Moon MRN: [id number]  Principal Diagnosis: MDD (major depressive disorder), severe (HCC)  Secondary Diagnoses: Principal Problem:   MDD (major depressive disorder), severe (HCC)   Current Medications:  Current Facility-Administered Medications  Medication Dose Route Frequency Provider Last Rate Last Admin   acetaminophen  (TYLENOL ) tablet 650 mg  650 mg Oral Q6H PRN Coleman, Carolyn H, NP   650 mg at 06/06/24 2111   alum & mag hydroxide-simeth (MAALOX/MYLANTA) 200-200-20 MG/5ML suspension 30 mL  30 mL Oral Q4H PRN Coleman, Carolyn H, NP       haloperidol  (HALDOL ) tablet 5 mg  5 mg Oral TID PRN Mardy Elveria DEL, NP       And   diphenhydrAMINE  (BENADRYL ) capsule 50 mg  50 mg Oral TID PRN Mardy Elveria DEL, NP       haloperidol  lactate (HALDOL ) injection 5 mg  5 mg Intramuscular TID PRN Mardy Elveria DEL, NP       And   diphenhydrAMINE  (BENADRYL ) injection 50 mg  50 mg Intramuscular TID PRN Mardy Elveria DEL, NP       And   LORazepam  (ATIVAN ) injection 2 mg  2 mg Intramuscular TID PRN Coleman, Carolyn H, NP       haloperidol  lactate (HALDOL ) injection 10 mg  10 mg Intramuscular TID PRN Mardy Elveria DEL, NP       And   diphenhydrAMINE  (BENADRYL ) injection 50 mg  50 mg Intramuscular TID PRN Mardy Elveria DEL, NP       And   LORazepam  (ATIVAN ) injection 2 mg  2 mg Intramuscular TID PRN Mardy Elveria DEL, NP       feeding supplement (ENSURE PLUS HIGH PROTEIN) liquid 237 mL  237 mL Oral BID BM Jadapalle, Sree, MD       hydrOXYzine  (ATARAX ) tablet 50 mg  50 mg Oral Q6H PRN Coleman, Carolyn H, NP   50 mg at 06/06/24 2111   levothyroxine  (SYNTHROID ) tablet 25 mcg  25 mcg Oral Q0600 Coleman, Carolyn H, NP   25 mcg at 06/07/24 9387   magnesium  hydroxide (MILK OF MAGNESIA) suspension 30 mL  30 mL Oral Daily PRN Mardy Elveria DEL, NP       nicotine  (NICODERM CQ  - dosed in mg/24 hours)  patch 14 mg  14 mg Transdermal Daily Jadapalle, Sree, MD   14 mg at 06/06/24 1651   nicotine  polacrilex (NICORETTE ) gum 2 mg  2 mg Oral PRN Jadapalle, Sree, MD   2 mg at 06/07/24 9171   sertraline  (ZOLOFT ) tablet 50 mg  50 mg Oral Daily Coleman, Carolyn H, NP   50 mg at 06/07/24 0827   traZODone  (DESYREL ) tablet 50 mg  50 mg Oral QHS Coleman, Carolyn H, NP   50 mg at 06/06/24 2111   PTA Medications: Medications Prior to Admission  Medication Sig Dispense Refill Last Dose/Taking   sertraline  (ZOLOFT ) 50 MG tablet Take 1 tablet (50 mg total) by mouth daily. 30 tablet 0 06/06/2024   levothyroxine  (SYNTHROID ) 25 MCG tablet Take 1 tablet (25 mcg total) by mouth daily at 6 (six) AM. (Patient not taking: Reported on 06/06/2024) 30 tablet 0    traZODone  (DESYREL ) 50 MG tablet Take 1 tablet (50 mg total) by mouth at bedtime. (Patient not taking: Reported on 06/06/2024) 30 tablet 0     Patient Stressors: Financial difficulties   Loss of job last Friday   Occupational concerns   Substance abuse  Patient Strengths: Ability for insight  Physical Health   Treatment Modalities: Medication Management, Group therapy, Case management,  1 to 1 session with clinician, Psychoeducation, Recreational therapy.   Physician Treatment Plan for Primary Diagnosis: MDD (major depressive disorder), severe (HCC) Long Term Goal(s):     Short Term Goals:    Medication Management: Evaluate patient's response, side effects, and tolerance of medication regimen.  Therapeutic Interventions: 1 to 1 sessions, Unit Group sessions and Medication administration.  Evaluation of Outcomes: Not Met  Physician Treatment Plan for Secondary Diagnosis: Principal Problem:   MDD (major depressive disorder), severe (HCC)  Long Term Goal(s):     Short Term Goals:       Medication Management: Evaluate patient's response, side effects, and tolerance of medication regimen.  Therapeutic Interventions: 1 to 1 sessions, Unit Group  sessions and Medication administration.  Evaluation of Outcomes: Not Met   RN Treatment Plan for Primary Diagnosis: MDD (major depressive disorder), severe (HCC) Long Term Goal(s): Knowledge of disease and therapeutic regimen to maintain health will improve  Short Term Goals: Ability to remain free from injury will improve, Ability to verbalize frustration and anger appropriately will improve, Ability to demonstrate self-control, Ability to participate in decision making will improve, Ability to verbalize feelings will improve, Ability to disclose and discuss suicidal ideas, Ability to identify and develop effective coping behaviors will improve, and Compliance with prescribed medications will improve  Medication Management: RN will administer medications as ordered by provider, will assess and evaluate patient's response and provide education to patient for prescribed medication. RN will report any adverse and/or side effects to prescribing provider.  Therapeutic Interventions: 1 on 1 counseling sessions, Psychoeducation, Medication administration, Evaluate responses to treatment, Monitor vital signs and CBGs as ordered, Perform/monitor CIWA, COWS, AIMS and Fall Risk screenings as ordered, Perform wound care treatments as ordered.  Evaluation of Outcomes: Not Met   LCSW Treatment Plan for Primary Diagnosis: MDD (major depressive disorder), severe (HCC) Long Term Goal(s): Safe transition to appropriate next level of care at discharge, Engage patient in therapeutic group addressing interpersonal concerns.  Short Term Goals: Engage patient in aftercare planning with referrals and resources, Increase social support, Increase ability to appropriately verbalize feelings, Increase emotional regulation, Facilitate acceptance of mental health diagnosis and concerns, Facilitate patient progression through stages of change regarding substance use diagnoses and concerns, Identify triggers associated with  mental health/substance abuse issues, and Increase skills for wellness and recovery  Therapeutic Interventions: Assess for all discharge needs, 1 to 1 time with Social worker, Explore available resources and support systems, Assess for adequacy in community support network, Educate family and significant other(s) on suicide prevention, Complete Psychosocial Assessment, Interpersonal group therapy.  Evaluation of Outcomes: Not Met   Progress in Treatment: Attending groups: Yes. and No. Participating in groups: Yes. Taking medication as prescribed: Yes. Toleration medication: Yes. Family/Significant other contact made: No, will contact:  when given permission.  Patient understands diagnosis: Yes. Discussing patient identified problems/goals with staff: Yes. Medical problems stabilized or resolved: Yes. Denies suicidal/homicidal ideation: Yes. Issues/concerns per patient self-inventory: No. Other: none.  New problem(s) identified: No, Describe:  none identified.  New Short Term/Long Term Goal(s): medication management for mood stabilization; elimination of SI thoughts; development of comprehensive mental wellness/sobriety plan.  Patient Goals: I have these racing thoughts, not wanting to live thoughts, and really want to get into treatment for substance use.     Discharge Plan or Barriers: CSW will assist pt with development of an appropriate  aftercare/discharge plan.   Reason for Continuation of Hospitalization: Depression Medication stabilization Suicidal ideation  Estimated Length of Stay: 1-7 days  Last 3 [place] Suicide Severity Risk Score: Flowsheet Row Admission (Current) from [facility]06/20[facility] ED from 06/05/2024 in [facility]  C-SSRS RISK CATEGORY High Risk High Risk High Risk    Last PHQ 2/9 Scores:     06/08/2023    8:00 AM 06/07/2023    2:09 PM 06/05/2023    4:55 AM  Depression screen PHQ 2/9  Decreased Interest 2 2 2   Down, Depressed, Hopeless 2 2 2   PHQ - 2 Score 4 4 4   Altered sleeping 1 1 1   Tired, decreased energy 1 1 1   Change in appetite 1 1 1   Feeling bad or failure about yourself  3 3 3   Trouble concentrating 1 1 1   Moving slowly or fidgety/restless 0 0 0  Suicidal thoughts 1 1 1   PHQ-9 Score 12  12  12    Difficult doing work/chores   Very difficult     Data saved with a previous flowsheet row definition    Scribe for Treatment Team: Nadara JONELLE Fam, LCSW 06/07/2024 11:19 AM

## 2024-06-07 NOTE — Plan of Care (Signed)

## 2024-06-07 NOTE — Group Note (Signed)
 Date:  06/07/2024 Time:  10:20 AM  Group Topic/Focus:  Goals Group:   The focus of this group is to help patients establish daily goals to achieve during treatment and discuss how the patient can incorporate goal setting into their daily lives to aide in recovery.    Participation Level:  Did Not Attend   Deitra Clap [facility] 06/07/2024, 10:20 AM

## 2024-06-07 NOTE — [facility] Suicide Risk Assessment (Signed)
 [facility] INPATIENT:  Family/Significant Other Suicide Prevention Education  Suicide Prevention Education:  Patient Refusal for Family/Significant Other Suicide Prevention Education: The patient [name] has refused to provide written consent for family/significant other to be provided Family/Significant Other Suicide Prevention Education during admission and/or prior to discharge.  Physician notified.  SPE completed with pt, as pt refused to consent to family contact. SPI pamphlet provided to pt and pt was encouraged to share information with support network, ask questions, and talk about any concerns relating to SPE. Pt denies access to guns/firearms and verbalized understanding of information provided. Mobile Crisis information also provided to pt.  Richard Moon Fam 06/07/2024, 4:19 PM

## 2024-06-07 NOTE — Plan of Care (Signed)
°  Problem: Education: Goal: Emotional status will improve 06/07/2024 1904 by Julio Dollie Bare, RN Outcome: Progressing 06/07/2024 1903 by Julio Dollie Bare, RN Outcome: Progressing Goal: Mental status will improve 06/07/2024 1904 by Julio Dollie Bare, RN Outcome: Progressing 06/07/2024 1903 by Julio Dollie Bare, RN Outcome: Progressing Goal: Verbalization of understanding the information provided will improve Outcome: Progressing   Problem: Activity: Goal: Interest or engagement in activities will improve Outcome: Progressing Goal: Sleeping patterns will improve Outcome: Progressing   Problem: Coping: Goal: Ability to verbalize frustrations and anger appropriately will improve 06/07/2024 1904 by Julio Dollie Bare, RN Outcome: Progressing 06/07/2024 1903 by Julio Dollie Bare, RN Outcome: Progressing Goal: Ability to demonstrate self-control will improve 06/07/2024 1904 by Julio Dollie Bare, RN Outcome: Progressing 06/07/2024 1903 by Julio Dollie Bare, RN Outcome: Progressing
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
## 2024-06-07 NOTE — Plan of Care (Signed)
   Problem: Education: Goal: Knowledge of Richard Moon General Education information/materials will improve Outcome: Progressing Goal: Emotional status will improve Outcome: Progressing Goal: Mental status will improve Outcome: Progressing Goal: Verbalization of understanding the information provided will improve Outcome: Progressing   Problem: Activity: Goal: Interest or engagement in activities will improve Outcome: Progressing Goal: Sleeping patterns will improve Outcome: Progressing   Problem: Coping: Goal: Ability to verbalize frustrations and anger appropriately will improve Outcome: Progressing Goal: Ability to demonstrate self-control will improve Outcome: Progressing

## 2024-06-07 NOTE — Group Note (Signed)
 [facility] LCSW Group Therapy Note   Group Date: 06/07/2024 Start Time: 1300 End Time: 1400   Type of Therapy/Topic:  Group Therapy:  Emotion Regulation  Participation Level:  Active   Description of Group:    The purpose of this group is to assist patients in learning to regulate negative emotions and experience positive emotions. Patients will be guided to discuss ways in which they have been vulnerable to their negative emotions. These vulnerabilities will be juxtaposed with experiences of positive emotions or situations, and patients challenged to use positive emotions to combat negative ones. Special emphasis will be placed on coping with negative emotions in conflict situations, and patients will process healthy conflict resolution skills.  Therapeutic Goals: Patient will identify two positive emotions or experiences to reflect on in order to balance out negative emotions:  Patient will label two or more emotions that they find the most difficult to experience:  Patient will be able to demonstrate positive conflict resolution skills through discussion or role plays:   Summary of Patient Progress: Patient was present for part of the group discussion. He was actively engaged with the topic and his comments helped further the discussion. Pt appeared to have slight insight into the topic. He appeared open and receptive to feedback/comments from both his peers and the facilitator.  Therapeutic Modalities:   Cognitive Behavioral Therapy Feelings Identification Dialectical Behavioral Therapy   Nadara JONELLE Fam, LCSW

## 2024-06-07 NOTE — H&P (Signed)
 Psychiatric Admission Assessment Adult  Patient Identification: Richard Moon MRN:  [id number] Date of Evaluation:  06/07/2024 Chief Complaint:  MDD (major depressive disorder), severe (HCC) [F32.2]   History of Present Illness: ***  Total Time spent with patient: {Time; 15 min - 8 hours:17441} Sleep  Sleep:Sleep: Fair  Past Psychiatric History: *** Psychiatric History:  Information collected from ***  Prev Dx/Sx: *** Current Psych Provider: *** Home Meds (current): *** Previous Med Trials: *** Therapy: ***  Prior Psych Hospitalization: ***  Prior Self Harm: *** Prior Violence: ***  Family Psych History: *** Family Hx suicide: ***  Social History:  Developmental Hx: *** Educational Hx: *** Occupational Hx: *** Legal Hx: *** Living Situation: *** Spiritual Hx: *** Access to weapons/lethal means: ***   Substance History Alcohol: ***  Type of alcohol *** Last Drink *** Number of drinks per day *** History of alcohol withdrawal seizures *** History of DT's *** Tobacco: *** Illicit drugs: *** Prescription drug abuse: *** Rehab hx: *** Is the patient at risk to self? {yes no:314532}  Has the patient been a risk to self in the past 6 months? {yes no:314532}  Has the patient been a risk to self within the distant past? {yes no:314532}  Is the patient a risk to others? {yes no:314532}  Has the patient been a risk to others in the past 6 months? {yes no:314532}  Has the patient been a risk to others within the distant past? {yes no:314532}   Columbia Scale:  Flowsheet Row Admission (Current) from 06/06/2024 in [facility] INPATIENT BEH[facility] MEDICINE ED from 06/05/2024 in [facility] Emergency Department at [facility] ([facility]  C-SSRS RISK CATEGORY High Risk High Risk High Risk     Past Medical History:  Past Medical History:  Diagnosis Date   Cocaine abuse (HCC)    Homeless     Hypothyroidism 03/04/2024   Hypovitaminosis D 03/04/2024   Major depressive disorder, recurrent severe without psychotic features (HCC)    Prediabetes 03/04/2024   Substance induced mood disorder (HCC)     Past Surgical History:  Procedure Laterality Date   HERNIA REPAIR     Family History: History reviewed. No pertinent family history.  Social History:  Social History   Substance and Sexual Activity  Alcohol Use Yes   Comment: Occasional alcohol use     Social History   Substance and Sexual Activity  Drug Use Yes   Types: Marijuana, Cocaine   Comment: daily cocaine use (reported $200-300/ daily); occasional cannabis use      Allergies:  Allergies[1] Lab Results: No results found for this or any previous visit (from the past 48 hours).  Blood Alcohol level:  Lab Results  Component Value Date   ETH <15 06/05/2024   ETH 43 (H) 02/29/2024    Metabolic Disorder Labs:  Lab Results  Component Value Date   HGBA1C 5.9 (H) 03/02/2024   MPG 122.63 03/02/2024   MPG 125.5 02/09/2023   No results found for: PROLACTIN Lab Results  Component Value Date   CHOL 179 03/02/2024   TRIG 168 (H) 03/02/2024   HDL 45 03/02/2024   CHOLHDL 4.0 03/02/2024   VLDL 34 03/02/2024   LDLCALC 100 (H) 03/02/2024   LDLCALC 65 02/09/2023    Current Medications: Current Facility-Administered Medications  Medication Dose Route Frequency Provider Last Rate Last Admin   acetaminophen  (TYLENOL ) tablet 650 mg  650 mg Oral Q6H PRN Mardy Elveria DEL, NP  650 mg at 06/07/24 2123   alum & mag hydroxide-simeth (MAALOX/MYLANTA) 200-200-20 MG/5ML suspension 30 mL  30 mL Oral Q4H PRN Coleman, Carolyn H, NP       chlordiazePOXIDE  (LIBRIUM ) capsule 25 mg  25 mg Oral Q6H PRN Lavette Yankovich L, PA-C       haloperidol  (HALDOL ) tablet 5 mg  5 mg Oral TID PRN Mardy Elveria DEL, NP       And   diphenhydrAMINE  (BENADRYL ) capsule 50 mg  50 mg Oral TID PRN Mardy Elveria DEL, NP       haloperidol  lactate  (HALDOL ) injection 5 mg  5 mg Intramuscular TID PRN Mardy Elveria DEL, NP       And   diphenhydrAMINE  (BENADRYL ) injection 50 mg  50 mg Intramuscular TID PRN Mardy Elveria DEL, NP       And   LORazepam  (ATIVAN ) injection 2 mg  2 mg Intramuscular TID PRN Coleman, Carolyn H, NP       haloperidol  lactate (HALDOL ) injection 10 mg  10 mg Intramuscular TID PRN Mardy Elveria DEL, NP       And   diphenhydrAMINE  (BENADRYL ) injection 50 mg  50 mg Intramuscular TID PRN Mardy Elveria DEL, NP       And   LORazepam  (ATIVAN ) injection 2 mg  2 mg Intramuscular TID PRN Coleman, Carolyn H, NP       feeding supplement (ENSURE PLUS HIGH PROTEIN) liquid 237 mL  237 mL Oral BID BM Jadapalle, Sree, MD   237 mL at 06/07/24 1435   hydrOXYzine  (ATARAX ) tablet 50 mg  50 mg Oral Q6H PRN Coleman, Carolyn H, NP   50 mg at 06/07/24 1734   levothyroxine  (SYNTHROID ) tablet 25 mcg  25 mcg Oral Q0600 Coleman, Carolyn H, NP   25 mcg at 06/07/24 9387   magnesium  hydroxide (MILK OF MAGNESIA) suspension 30 mL  30 mL Oral Daily PRN Coleman, Carolyn H, NP       nicotine  (NICODERM CQ  - dosed in mg/24 hours) patch 14 mg  14 mg Transdermal Daily Jadapalle, Sree, MD   14 mg at 06/06/24 1651   nicotine  polacrilex (NICORETTE ) gum 2 mg  2 mg Oral PRN Jadapalle, Sree, MD   2 mg at 06/07/24 9171   sertraline  (ZOLOFT ) tablet 50 mg  50 mg Oral Daily Coleman, Carolyn H, NP   50 mg at 06/07/24 9172   traZODone  (DESYREL ) tablet 25 mg  25 mg Oral QHS PRN Rennie Hack L, PA-C   25 mg at 06/07/24 2123   PTA Medications: Medications Prior to Admission  Medication Sig Dispense Refill Last Dose/Taking   sertraline  (ZOLOFT ) 50 MG tablet Take 1 tablet (50 mg total) by mouth daily. 30 tablet 0 06/06/2024   levothyroxine  (SYNTHROID ) 25 MCG tablet Take 1 tablet (25 mcg total) by mouth daily at 6 (six) AM. (Patient not taking: Reported on 06/06/2024) 30 tablet 0    traZODone  (DESYREL ) 50 MG tablet Take 1 tablet (50 mg total) by mouth at bedtime.  (Patient not taking: Reported on 06/06/2024) 30 tablet 0     Psychiatric Specialty Exam:  Presentation  General Appearance:  Appropriate for Environment  Eye Contact: Fair  Speech: Garbled  Speech Volume: Normal    Mood and Affect  Mood: Depressed  Affect: Congruent   Thought Process  Thought Processes: Coherent  Descriptions of Associations:Intact  Orientation:Full (Time, Place and Person)  Thought Content:WDL  Hallucinations:No data recorded Ideas of Reference:None  Suicidal Thoughts:Suicidal Thoughts: Yes, Active SI  Active Intent and/or Plan: Without Intent; Without Plan  Homicidal Thoughts:Homicidal Thoughts: No   Sensorium  Memory: Immediate Fair; Recent Fair  Judgment: Poor  Insight: Fair   Chartered Certified Accountant: Fair  Attention Span: Fair  Recall: [employer] of Knowledge: Fair  Language: Fair   Psychomotor Activity  Psychomotor Activity: Psychomotor Activity: Normal   Assets  Assets: Manufacturing Systems Engineer; Desire for Improvement    Musculoskeletal: Strength & Muscle Tone: {desc; muscle tone:32375} Gait & Station: {PE GAIT ED WJUO:77474}  Physical Exam: Physical Exam ROS Blood pressure 127/73, pulse 80, temperature 98.1 F (36.7 C), temperature source Oral, resp. rate 20, height 5' 9 (1.753 m), weight 64.4 kg, SpO2 99%. Body mass index is 20.97 kg/m.  Principal Diagnosis: MDD (major depressive disorder), severe (HCC) Diagnosis:  Principal Problem:   MDD (major depressive disorder), severe (HCC)   Clinical Decision Making:  Treatment Plan Summary:  Safety and Monitoring:             -- Voluntary admission to inpatient psychiatric unit for safety, stabilization and treatment             -- Daily contact with patient to assess and evaluate symptoms and progress in treatment             -- Patient's case to be discussed in multi-disciplinary team meeting             -- Observation Level: q15  minute checks             -- Vital signs:  q12 hours             -- Precautions: suicide, elopement, and assault   2. Psychiatric Diagnoses and Treatment:                   -- The risks/benefits/side-effects/alternatives to this medication were discussed in detail with the patient and time was given for questions. The patient consents to medication trial.                -- Metabolic profile and EKG monitoring obtained while on an atypical antipsychotic (BMI: Lipid Panel: HbgA1c: QTc:)              -- Encouraged patient to participate in unit milieu and in scheduled group therapies                            3. Medical Issues Being Addressed:      4. Discharge Planning:              -- Social work and case management to assist with discharge planning and identification of hospital follow-up needs prior to discharge             -- Estimated LOS: 5-7 days             -- Discharge Concerns: Need to establish a safety plan; Medication compliance and effectiveness             -- Discharge Goals: Return home with outpatient referrals follow ups  Physician Treatment Plan for Primary Diagnosis: MDD (major depressive disorder), severe (HCC) Long Term Goal(s): {[facility] MD Tx Plan Long Term Goals:30414007::Improvement in symptoms so as ready for discharge}  Short Term Goals: [facility] MD Tx Plan Short Term Hnjod:[id number]}  Physician Treatment Plan for Secondary Diagnosis: Principal Problem:   MDD (major depressive disorder), severe (HCC)  Long Term Goal(s): [facility] MD Tx Plan Long Term  Goals:[id number]::Improvement in symptoms so as ready for discharge}  Short Term Goals: [facility] MD Tx Plan Short Term Hnjod:[id number]}  I certify that inpatient services furnished can reasonably be expected to improve the patient's condition.    Javian Nudd LITTIE Lukes, PA-C 12/17/202511:32 PM     [1] No Known Allergies

## 2024-06-08 DIAGNOSIS — F323 Major depressive disorder, single episode, severe with psychotic features: Secondary | ICD-10-CM | POA: Diagnosis not present

## 2024-06-08 DIAGNOSIS — F333 Major depressive disorder, recurrent, severe with psychotic symptoms: Principal | ICD-10-CM

## 2024-06-08 MED ORDER — ENSURE PLUS HIGH PROTEIN PO LIQD
237.0000 mL | Freq: Three times a day (TID) | ORAL | Status: DC
  Administered 2024-06-08 – 2024-06-20 (×27): 237 mL via ORAL

## 2024-06-08 MED ORDER — ADULT MULTIVITAMIN W/MINERALS CH
1.0000 | ORAL_TABLET | Freq: Every day | ORAL | Status: DC
  Administered 2024-06-08 – 2024-06-20 (×13): 1 via ORAL
  Filled 2024-06-08 (×11): qty 1

## 2024-06-08 NOTE — Group Note (Signed)
 Date:  06/08/2024 Time:  9:27 PM  Group Topic/Focus:  Managing Feelings:   The focus of this group is to identify what feelings patients have difficulty handling and develop a plan to handle them in a healthier way upon discharge.    Participation Level:  Active  Participation Quality:  Appropriate  Affect:  Appropriate  Cognitive:  Appropriate  Insight: Appropriate  Engagement in Group:  Engaged  Modes of Intervention:  Discussion  Additional Comments:    Jennalyn Cawley L 06/08/2024, 9:27 PM

## 2024-06-08 NOTE — Group Note (Signed)
 Recreation Therapy Group Note   Group Topic:General Recreation  Group Date: 06/08/2024 Start Time: 1525 End Time: 1545 Facilitators: Celestia Jeoffrey BRAVO, LRT, CTRS Location: Courtyard  Group Description: [employer]. LRT and patients played games of basketball, drew with chalk, and played corn hole while outside in the courtyard while getting fresh air and sunlight. Music was being played in the background. LRT and peers conversed about different games they have played before, what they do in their free time and anything else that is on their minds. LRT encouraged pts to drink water after being outside, sweating and getting their heart rate up.  Goal Area(s) Addressed: Patient will build on frustration tolerance skills. Patients will partake in a competitive play game with peers. Patients will gain knowledge of new leisure interest/hobby.    Affect/Mood: N/A   Participation Level: Did not attend    Clinical Observations/Individualized Feedback: Patient did not attend.  Plan: Continue to engage patient in RT group sessions 2-3x/week.   Jeoffrey BRAVO Celestia, LRT, CTRS 06/08/2024 4:22 PM

## 2024-06-08 NOTE — Plan of Care (Signed)

## 2024-06-08 NOTE — Group Note (Signed)
 Date:  06/08/2024 Time:  10:13 AM  Group Topic/Focus:  Emotional Education:   The focus of this group is to discuss what feelings/emotions are, and how they are experienced.    Participation Level:  Active  Participation Quality:  Appropriate  Affect:  Appropriate  Cognitive:  Appropriate  Insight: Appropriate  Engagement in Group:  Engaged  Modes of Intervention:  Activity  Additional Comments:    Richard Moon Richard Moon 06/08/2024, 10:13 AM

## 2024-06-08 NOTE — Group Note (Signed)
 Recreation Therapy Group Note   Group Topic:Goal Setting  Group Date: 06/08/2024 Start Time: 1000 End Time: 1050 Facilitators: Celestia Jeoffrey BRAVO, LRT, CTRS Location: Craft Room  Group Description: Product/process Development Scientist. Patients were given many different magazines, a glue stick, markers, and a piece of cardstock paper. LRT and pts discussed the importance of having goals in life. LRT and pts discussed the difference between short-term and long-term goals, as well as what a SMART goal is. LRT encouraged pts to create a vision board, with images they picked and then cut out with safety scissors from the magazine, for themselves, that capture their short and long-term goals. LRT encouraged pts to show and explain their vision board to the group.   Goal Area(s) Addressed:  Patient will gain knowledge of short vs. long term goals.  Patient will identify goals for themselves. Patient will practice setting SMART goals. Patient will verbalize their goals to LRT and peers.  Affect/Mood: Appropriate   Participation Level: Active and Engaged   Participation Quality: Independent   Behavior: Calm and Cooperative   Speech/Thought Process: Coherent   Insight: Fair   Judgement: Fair    Modes of Intervention: Art, Education, and Exploration   Patient Response to Interventions:  Attentive, Engaged, and Receptive   Education Outcome:  Acknowledges education   Clinical Observations/Individualized Feedback: Franco was active in their participation of session activities and group discussion. Pt identified meet the requirements to go home and to take my meds as goals.   Plan: Continue to engage patient in RT group sessions 2-3x/week.   Jeoffrey BRAVO Celestia, LRT, CTRS 06/08/2024 11:47 AM

## 2024-06-08 NOTE — [facility] Counselor (Signed)
 Referral information sent to [facility].   Richard Moon. Chaim, MSW, LCSW, LCAS 06/08/2024 12:54 PM

## 2024-06-08 NOTE — Group Note (Signed)
 Date:  06/08/2024 Time:  5:12 PM  Group Topic/Focus:  Activity Group: The focus of the group is to promote activity for the patients and encourage them to go outside to the courtyard and get some fresh air and exercise.    Participation Level:  Did Not Attend   Richard Moon 06/08/2024, 5:12 PM

## 2024-06-08 NOTE — Progress Notes (Signed)
°   06/08/24 0800  Psych Admission Type (Psych Patients Only)  Admission Status Voluntary  Psychosocial Assessment  Patient Complaints Depression  Eye Contact Fair  Facial Expression Flat  Affect Depressed  Speech Logical/coherent  Interaction Assertive  Motor Activity Slow  Appearance/Hygiene Unremarkable;In scrubs  Behavior Characteristics Cooperative;Calm  Mood Depressed  Aggressive Behavior  Effect No apparent injury  Thought Process  Coherency WDL  Content Confabulation  Delusions None reported or observed  Perception WDL  Hallucination Auditory (pt reports voices. when asked if they give him commands, are saying positive things or negative, pt stated, only sounds; pt is not observed by staff to be responding to internal stimuli)  Judgment Impaired  Confusion WDL  Danger to Self  Current suicidal ideation? Denies  Description of Suicide Plan no current plan  Self-Injurious Behavior No self-injurious ideation or behavior indicators observed or expressed   Agreement Not to Harm Self Yes  Description of Agreement Verbal  Danger to Others  Danger to Others None reported or observed

## 2024-06-08 NOTE — Group Note (Signed)
 [facility] LCSW Group Therapy Note   Group Date: 06/08/2024 Start Time: 1300 End Time: 1400   Type of Therapy/Topic:  Group Therapy:  Balance in Life  Participation Level:  Did Not Attend   Description of Group:    This group will address the concept of balance and how it feels and looks when one is unbalanced. Patients will be encouraged to process areas in their lives that are out of balance, and identify reasons for remaining unbalanced. Facilitators will guide patients utilizing problem- solving interventions to address and correct the stressor making their life unbalanced. Understanding and applying boundaries will be explored and addressed for obtaining  and maintaining a balanced life. Patients will be encouraged to explore ways to assertively make their unbalanced needs known to significant others in their lives, using other group members and facilitator for support and feedback.  Therapeutic Goals: Patient will identify two or more emotions or situations they have that consume much of in their lives. Patient will identify signs/triggers that life has become out of balance:  Patient will identify two ways to set boundaries in order to achieve balance in their lives:  Patient will demonstrate ability to communicate their needs through discussion and/or role plays  Summary of Patient Progress: Patient did not attend group.   Therapeutic Modalities:   Cognitive Behavioral Therapy Solution-Focused Therapy Assertiveness Training   Nadara JONELLE Fam, LCSW

## 2024-06-08 NOTE — Progress Notes (Signed)
 NUTRITION ASSESSMENT  Pt identified as at risk on the Malnutrition Screen Tool  INTERVENTION:  -MVI with minerals daily -Ensure Plus High Protein po TID, each supplement provides 350 kcal and 20 grams of protein  -Continue regular diet   NUTRITION DIAGNOSIS: Unintentional weight loss related to sub-optimal intake as evidenced by pt report.   Goal: Pt to meet >/= 90% of their estimated nutrition needs.  Monitor:  PO intake  Assessment:  63 y.o. year-old male presents with chief complaint of suicidal and homicidal thoughts. States that his medications have not been working and he feels increasingly irritable. States that he is now having thoughts of wanting to hurt himself and hurt others. States that he has been using cocaine, and then uses alcohol to try and go to sleep.   Patient admitted with MDD.   Patient currently on a regular diet. No meal completion data available to assess at this time.   Limited weight history in [facility]. Patient has experienced a 8.4% weight loss over the past 3 months, which is significant for time frame. Patient is high risk for malnutrition, however, unable to identify at this time. He would benefit from addition of oral nutrition supplements.   Labs reviewed. Tox screen positive for cocaine. Ethyl alcohol: <15.    63 y.o. male  Height: Ht Readings from Last 1 Encounters:  06/06/24 5' 9 (1.753 m)    Weight: Wt Readings from Last 1 Encounters:  06/06/24 64.4 kg    Weight Hx: Wt Readings from Last 10 Encounters:  06/06/24 64.4 kg  06/06/24 70.3 kg  03/02/24 70.3 kg  01/22/24 70.8 kg  01/21/24 72.6 kg  06/21/23 70.3 kg  06/04/23 70.3 kg  02/08/23 62.1 kg  02/06/23 68 kg  05/27/22 67.8 kg    BMI:  Body mass index is 20.97 kg/m. BMI WDL.   Estimated Nutritional Needs: Kcal: 25-30 kcal/kg Protein: > 1 gram protein/kg Fluid: 1 ml/kcal  Diet Order:  Diet Order             Diet regular Room service appropriate? Yes;  Fluid consistency: Thin  Diet effective now                  Pt is also offered choice of unit snacks mid-morning and mid-afternoon.  Pt is eating as desired.   Lab results and medications reviewed.   Margery ORN, RD, LDN, CDCES Registered Dietitian III Certified Diabetes Care and Education Specialist If unable to reach this RD, please use RD Inpatient group chat on secure chat between hours of 8am-4 pm daily

## 2024-06-08 NOTE — Progress Notes (Addendum)
 [facility] MD Progress Note  06/08/2024 12:39 PM Richard Moon  MRN:  [id number]  Richard Moon is a 63 y.o. year-old male presents to the ED with chief complaint of suicidal and homicidal thoughts.  States that his medications have not been working and he feels increasingly irritable.  States that he is now having thoughts of wanting to hurt himself and hurt others.  States that he has been using cocaine, and then uses alcohol to try and go to sleep.  He denies any recent illnesses.  Denies fever, cough, cold symptoms. Subjective:  Chart reviewed, case discussed in multidisciplinary meeting, patient seen during rounds.   Patient is noted to be participating in the groups.  He came out to talk to the provider.  He reports feeling better and doing well on the unit.  He reports ongoing depression and rates depression as 7 out of 10, 10 being the worst.  Today he reports that his anxiety is under control less than 5 out of 10, 10 being the worst.  He denies any panic attacks.  He reports ongoing auditory hallucinations and describes them as some sounds and noises which occurred today morning.  He continues to endorse passive suicidal thoughts given his ongoing psychosocial stressors of having no support in the community and having financial problems.  He denies HI.  He reports that he is working with the social work team and transitioning to inpatient rehab.  Past Psychiatric History: see h&P Family History: History reviewed. No pertinent family history. Social History:  Social History   Substance and Sexual Activity  Alcohol Use Yes   Comment: Occasional alcohol use     Social History   Substance and Sexual Activity  Drug Use Yes   Types: Marijuana, Cocaine   Comment: daily cocaine use (reported $200-300/ daily); occasional cannabis use    Social History   Socioeconomic History   Marital status: Single    Spouse name: Not on file   Number of children: Not on file   Years of education: Not  on file   Highest education level: Not on file  Occupational History   Not on file  Tobacco Use   Smoking status: Some Days    Current packs/day: 0.25    Average packs/day: 0.3 packs/day for 20.0 years (5.0 ttl pk-yrs)    Types: Cigarettes   Smokeless tobacco: Never  Vaping Use   Vaping status: Former  Substance and Sexual Activity   Alcohol use: Yes    Comment: Occasional alcohol use   Drug use: Yes    Types: Marijuana, Cocaine    Comment: daily cocaine use (reported $200-300/ daily); occasional cannabis use   Sexual activity: Yes    Birth control/protection: Condom  Other Topics Concern   Not on file  Social History Narrative   Not on file   Social Drivers of Health   Tobacco Use: High Risk (06/06/2024)   Patient History    Smoking Tobacco Use: Some Days    Smokeless Tobacco Use: Never    Passive Exposure: Not on file  Financial Resource Strain: Not on file  Food Insecurity: Food Insecurity Present (06/06/2024)   Epic    Worried About Programme Researcher, Broadcasting/film/video in the Last Year: Sometimes true    [employer] of Food in the Last Year: Sometimes true  Transportation Needs: Unmet Transportation Needs (06/06/2024)   Epic    Lack of Transportation (Medical): Yes    Lack of Transportation (Non-Medical): Yes  Physical Activity: Not on  file  Stress: Not on file  Social Connections: Not on file  Depression (PHQ2-9): High Risk (06/08/2023)   Depression (PHQ2-9)    PHQ-2 Score: 12  Alcohol Screen: Medium Risk (06/06/2024)   Alcohol Screen    Last Alcohol Screening Score (AUDIT): 14  Housing: High Risk (06/06/2024)   Epic    Unable to Pay for Housing in the Last Year: Yes    Number of Times Moved in the Last Year: 4    Homeless in the Last Year: Yes  Utilities: Not At Risk (06/06/2024)   Epic    Threatened with loss of utilities: No  Health Literacy: Not on file   Past Medical History:  Past Medical History:  Diagnosis Date   Cocaine abuse (HCC)    Homeless     Hypothyroidism 03/04/2024   Hypovitaminosis D 03/04/2024   Major depressive disorder, recurrent severe without psychotic features (HCC)    Prediabetes 03/04/2024   Substance induced mood disorder (HCC)     Past Surgical History:  Procedure Laterality Date   HERNIA REPAIR      Current Medications: Current Facility-Administered Medications  Medication Dose Route Frequency Provider Last Rate Last Admin   acetaminophen  (TYLENOL ) tablet 650 mg  650 mg Oral Q6H PRN Coleman, Carolyn H, NP   650 mg at 06/07/24 2123   alum & mag hydroxide-simeth (MAALOX/MYLANTA) 200-200-20 MG/5ML suspension 30 mL  30 mL Oral Q4H PRN Mardy Elveria DEL, NP       chlordiazePOXIDE  (LIBRIUM ) capsule 25 mg  25 mg Oral Q6H PRN Hunter, Crystal L, PA-C       haloperidol  (HALDOL ) tablet 5 mg  5 mg Oral TID PRN Mardy Elveria DEL, NP       And   diphenhydrAMINE  (BENADRYL ) capsule 50 mg  50 mg Oral TID PRN Mardy Elveria DEL, NP       haloperidol  lactate (HALDOL ) injection 5 mg  5 mg Intramuscular TID PRN Mardy Elveria DEL, NP       And   diphenhydrAMINE  (BENADRYL ) injection 50 mg  50 mg Intramuscular TID PRN Mardy Elveria DEL, NP       And   LORazepam  (ATIVAN ) injection 2 mg  2 mg Intramuscular TID PRN Mardy Elveria DEL, NP       haloperidol  lactate (HALDOL ) injection 10 mg  10 mg Intramuscular TID PRN Mardy Elveria DEL, NP       And   diphenhydrAMINE  (BENADRYL ) injection 50 mg  50 mg Intramuscular TID PRN Mardy Elveria DEL, NP       And   LORazepam  (ATIVAN ) injection 2 mg  2 mg Intramuscular TID PRN Mardy Elveria DEL, NP       feeding supplement (ENSURE PLUS HIGH PROTEIN) liquid 237 mL  237 mL Oral TID BM Torah Pinnock, MD       hydrOXYzine  (ATARAX ) tablet 50 mg  50 mg Oral Q6H PRN Mardy Elveria DEL, NP   50 mg at 06/07/24 1734   levothyroxine  (SYNTHROID ) tablet 25 mcg  25 mcg Oral Q0600 Mardy Elveria DEL, NP   25 mcg at 06/08/24 9387   magnesium  hydroxide (MILK OF MAGNESIA) suspension 30 mL  30 mL Oral  Daily PRN Mardy Elveria DEL, NP       multivitamin with minerals tablet 1 tablet  1 tablet Oral Daily Jiah Bari, MD   1 tablet at 06/08/24 1008   nicotine  (NICODERM CQ  - dosed in mg/24 hours) patch 14 mg  14 mg Transdermal Daily Greer Koeppen, MD  14 mg at 06/08/24 9196   nicotine  polacrilex (NICORETTE ) gum 2 mg  2 mg Oral PRN Sharalee Witman, MD   2 mg at 06/07/24 9171   sertraline  (ZOLOFT ) tablet 50 mg  50 mg Oral Daily Coleman, Carolyn H, NP   50 mg at 06/08/24 9196   traZODone  (DESYREL ) tablet 25 mg  25 mg Oral QHS PRN Hunter, Crystal L, PA-C   25 mg at 06/07/24 2123    Lab Results: No results found for this or any previous visit (from the past 48 hours).  Blood Alcohol level:  Lab Results  Component Value Date   ETH <15 06/05/2024   ETH 43 (H) 02/29/2024    Metabolic Disorder Labs: Lab Results  Component Value Date   HGBA1C 5.9 (H) 03/02/2024   MPG 122.63 03/02/2024   MPG 125.5 02/09/2023   No results found for: PROLACTIN Lab Results  Component Value Date   CHOL 179 03/02/2024   TRIG 168 (H) 03/02/2024   HDL 45 03/02/2024   CHOLHDL 4.0 03/02/2024   VLDL 34 03/02/2024   LDLCALC 100 (H) 03/02/2024   LDLCALC 65 02/09/2023    Physical Findings: AIMS:  , ,  ,  ,    CIWA:  CIWA-Ar Total: 0 COWS:      Psychiatric Specialty Exam:  Presentation  General Appearance:  Appropriate for Environment  Eye Contact: Fair  Speech: Garbled  Speech Volume: Normal    Mood and Affect  Mood: Depressed  Affect: Congruent   Thought Process  Thought Processes: Coherent  Orientation:Full (Time, Place and Person)  Thought Content:WDL  Hallucinations: Auditory hallucinations noises Ideas of Reference:None  Suicidal Thoughts: Passive SI with no intent or plan Homicidal Thoughts:Homicidal Thoughts: No   Sensorium  Memory: Immediate Fair; Recent Fair  Judgment: Poor  Insight: Fair   Chartered Certified Accountant: Fair  Attention  Span: Fair  Recall: [employer] of Knowledge: Fair  Language: Fair   Psychomotor Activity  Psychomotor Activity: Psychomotor Activity: Normal  Musculoskeletal: Strength & Muscle Tone: within normal limits Gait & Station: normal Assets  Assets: Manufacturing Systems Engineer; Desire for Improvement    Physical Exam: Physical Exam ROS Blood pressure 115/82, pulse 74, temperature 98.1 F (36.7 C), temperature source Oral, resp. rate 18, height 5' 9 (1.753 m), weight 64.4 kg, SpO2 98%. Body mass index is 20.97 kg/m.  Diagnosis: Principal Problem:   MDD (major depressive disorder), severe (HCC) With psychosis  Treatment Plan Summary:   Safety and Monitoring:             -- Voluntary admission to inpatient psychiatric unit for safety, stabilization and treatment             -- Daily contact with patient to assess and evaluate symptoms and progress in treatment             -- Patient's case to be discussed in multi-disciplinary team meeting             -- Observation Level: q15 minute checks             -- Vital signs:  q12 hours             -- Precautions: suicide, elopement, and assault   2. Psychiatric Diagnoses and Treatment:     Zoloft  50 mg once daily             Trazodone  25 mg at bedtime as needed for sleep - dose decreased per patient request due to feeling 50  mg was too much CIWA protocol As needed Librium    -- The risks/benefits/side-effects/alternatives to this medication were discussed in detail with the patient and time was given for questions. The patient consents to medication trial.                -- Metabolic profile and EKG monitoring obtained while on an atypical antipsychotic (BMI: Lipid Panel: HbgA1c: QTc:)              -- Encouraged patient to participate in unit milieu and in scheduled group therapies                            3. Medical Issues Being Addressed:  No acute concerns.    4. Discharge Planning:   -- Social work and case management  to assist with discharge planning and identification of hospital follow-up needs prior to discharge  -- Estimated LOS: 3-4 days  Brezlyn Manrique, MD 06/08/2024, 12:39 PM

## 2024-06-09 DIAGNOSIS — F333 Major depressive disorder, recurrent, severe with psychotic symptoms: Secondary | ICD-10-CM | POA: Diagnosis not present

## 2024-06-09 LAB — LIPID PANEL
Cholesterol: 178 mg/dL (ref 0–200)
HDL: 81 mg/dL
LDL Cholesterol: 78 mg/dL (ref 0–99)
Total CHOL/HDL Ratio: 2.2 ratio
Triglycerides: 92 mg/dL
VLDL: 18 mg/dL (ref 0–40)

## 2024-06-09 LAB — HEMOGLOBIN A1C
Hgb A1c MFr Bld: 5.6 % (ref 4.8–5.6)
Mean Plasma Glucose: 114.02 mg/dL

## 2024-06-09 MED ORDER — SERTRALINE HCL 25 MG PO TABS
75.0000 mg | ORAL_TABLET | Freq: Every day | ORAL | Status: DC
  Administered 2024-06-10 – 2024-06-11 (×2): 75 mg via ORAL
  Filled 2024-06-09 (×2): qty 3

## 2024-06-09 NOTE — Plan of Care (Signed)
" °  Problem: Education: Goal: Knowledge of [facility] Education information/materials will improve 06/09/2024 1659 by Shirley Jon FALCON, RN Outcome: Progressing 06/09/2024 1658 by Shirley Jon FALCON, RN Outcome: Progressing Goal: Emotional status will improve 06/09/2024 1659 by Shirley Jon FALCON, RN Outcome: Progressing 06/09/2024 1658 by Shirley Jon FALCON, RN Outcome: Progressing Goal: Mental status will improve 06/09/2024 1659 by Shirley Jon FALCON, RN Outcome: Progressing 06/09/2024 1658 by Shirley Jon FALCON, RN Outcome: Progressing Goal: Verbalization of understanding the information provided will improve 06/09/2024 1659 by Shirley Jon FALCON, RN Outcome: Progressing 06/09/2024 1658 by Shirley Jon FALCON, RN Outcome: Progressing   Problem: Activity: Goal: Interest or engagement in activities will improve 06/09/2024 1659 by Shirley Jon FALCON, RN Outcome: Progressing 06/09/2024 1658 by Shirley Jon FALCON, RN Outcome: Progressing Goal: Sleeping patterns will improve 06/09/2024 1659 by Shirley Jon FALCON, RN Outcome: Progressing 06/09/2024 1658 by Shirley Jon FALCON, RN Outcome: Progressing   Problem: Coping: Goal: Ability to verbalize frustrations and anger appropriately will improve 06/09/2024 1659 by Shirley Jon FALCON, RN Outcome: Progressing 06/09/2024 1658 by Shirley Jon FALCON, RN Outcome: Progressing Goal: Ability to demonstrate self-control will improve 06/09/2024 1659 by Shirley Jon FALCON, RN Outcome: Progressing 06/09/2024 1658 by Shirley Jon FALCON, RN Outcome: Progressing   Problem: Health Behavior/Discharge Planning: Goal: Identification of resources available to assist in meeting health care needs will improve 06/09/2024 1659 by Shirley Jon FALCON, RN Outcome: Progressing 06/09/2024 1658 by Shirley Jon FALCON, RN Outcome: Progressing Goal: Compliance with treatment plan for underlying cause of condition will improve 06/09/2024 1659 by Shirley Jon FALCON,  RN Outcome: Progressing 06/09/2024 1658 by Shirley Jon FALCON, RN Outcome: Progressing   Problem: Physical Regulation: Goal: Ability to maintain clinical measurements within normal limits will improve 06/09/2024 1659 by Shirley Jon FALCON, RN Outcome: Progressing 06/09/2024 1658 by Shirley Jon FALCON, RN Outcome: Progressing   Problem: Safety: Goal: Periods of time without injury will increase 06/09/2024 1659 by Shirley Jon FALCON, RN Outcome: Progressing 06/09/2024 1658 by Shirley Jon FALCON, RN Outcome: Progressing   "

## 2024-06-09 NOTE — Progress Notes (Signed)
" °   06/08/24 2020  Psych Admission Type (Psych Patients Only)  Admission Status Voluntary  Psychosocial Assessment  Patient Complaints Anxiety;Depression  Eye Contact Fair  Facial Expression Flat  Affect Depressed  Speech Logical/coherent  Interaction Assertive  Motor Activity Other (Comment) (WDL)  Appearance/Hygiene Unremarkable  Behavior Characteristics Cooperative;Appropriate to situation  Mood Depressed;Anxious  Aggressive Behavior  Effect No apparent injury  Thought Process  Coherency WDL  Content WDL  Delusions None reported or observed  Perception WDL  Hallucination None reported or observed  Judgment Limited  Confusion None  Danger to Self  Current suicidal ideation? Denies  Agreement Not to Harm Self Yes  Description of Agreement Verbal  Danger to Others  Danger to Others None reported or observed    "

## 2024-06-09 NOTE — Group Note (Signed)
 Date:  06/09/2024 Time:  7:14 PM  Group Topic/Focus:  Wellness Toolbox:   The focus of this group is to discuss various aspects of wellness, balancing those aspects and exploring ways to increase the ability to experience wellness.  Patients will create a wellness toolbox for use upon discharge.    Participation Level:  Did Not Attend   Deitra Clap [facility] 06/09/2024, 7:14 PM

## 2024-06-09 NOTE — Plan of Care (Signed)

## 2024-06-09 NOTE — Group Note (Signed)
 Date:  06/09/2024 Time:  9:05 PM  Group Topic/Focus:  Coping With Mental Health Crisis:   The purpose of this group is to help patients identify strategies for coping with mental health crisis.  Group discusses possible causes of crisis and ways to manage them effectively. Developing a Wellness Toolbox:   The focus of this group is to help patients develop a wellness toolbox with skills and strategies to promote recovery upon discharge.    Participation Level:  Active  Participation Quality:  Appropriate  Affect:  Appropriate  Cognitive:  Alert  Insight: Appropriate  Engagement in Group:  Engaged  Modes of Intervention:  Discussion and Education  Additional Comments:    Elona Yinger L 06/09/2024, 9:05 PM

## 2024-06-09 NOTE — Progress Notes (Signed)
" °   06/09/24 0850  Psych Admission Type (Psych Patients Only)  Admission Status Voluntary  Psychosocial Assessment  Patient Complaints Anxiety;Depression  Eye Contact Fair  Facial Expression Flat  Affect Depressed  Speech Logical/coherent  Interaction Assertive  Motor Activity Other (Comment) (WDL)  Appearance/Hygiene Unremarkable  Behavior Characteristics Cooperative;Appropriate to situation  Mood Depressed;Anxious  Aggressive Behavior  Effect No apparent injury  Thought Process  Coherency WDL  Content WDL  Delusions None reported or observed  Hallucination Auditory (pt reported that he hears bells ringing)  Judgment Limited  Confusion None  Danger to Self  Current suicidal ideation? Denies  Description of Suicide Plan no plan  Self-Injurious Behavior No self-injurious ideation or behavior indicators observed or expressed   Agreement Not to Harm Self Yes  Description of Agreement Verbal    "

## 2024-06-09 NOTE — Plan of Care (Signed)
   Problem: Education: Goal: Knowledge of Richard Moon General Education information/materials will improve Outcome: Progressing Goal: Emotional status will improve Outcome: Progressing Goal: Mental status will improve Outcome: Progressing Goal: Verbalization of understanding the information provided will improve Outcome: Progressing   Problem: Activity: Goal: Interest or engagement in activities will improve Outcome: Progressing Goal: Sleeping patterns will improve Outcome: Progressing   Problem: Coping: Goal: Ability to verbalize frustrations and anger appropriately will improve Outcome: Progressing Goal: Ability to demonstrate self-control will improve Outcome: Progressing

## 2024-06-09 NOTE — Group Note (Signed)
 Recreation Therapy Group Note   Group Topic:Leisure Education  Group Date: 06/09/2024 Start Time: 1530 End Time: 1620 Facilitators: Celestia Jeoffrey FORBES ARTICE, CTRS Location: Craft Room  Group Description: Leisure. Patients were given the option to choose from journaling, coloring, drawing, making origami, playing with playdoh, listening to music or singing karaoke. LRT and pts discussed the meaning of leisure, the importance of participating in leisure during their free time/when they're outside of the hospital, as well as how our leisure interests can also serve as coping skills.   Goal Area(s) Addressed:  Patient will identify a current leisure interest.  Patient will learn the definition of leisure. Patient will practice making a positive decision. Patient will have the opportunity to try a new leisure activity. Patient will communicate with peers and LRT.   Affect/Mood: Appropriate   Participation Level: Moderate    Clinical Observations/Individualized Feedback: Richard Moon came late to group. Pt was present for half of the session.   Plan: Continue to engage patient in RT group sessions 2-3x/week.   Jeoffrey FORBES Celestia, LRT, CTRS 06/09/2024 5:13 PM

## 2024-06-09 NOTE — Group Note (Signed)
 Date:  06/09/2024 Time:  10:39 AM  Group Topic/Focus:  Goals Group:   The focus of this group is to help patients establish daily goals to achieve during treatment and discuss how the patient can incorporate goal setting into their daily lives to aide in recovery.    Participation Level:  Did Not Attend   Deitra Clap [facility] 06/09/2024, 10:39 AM

## 2024-06-09 NOTE — [facility] Group Notes (Signed)
 Spirituality Group   Description: Participant directed exploration of values, beliefs and meaning   **Focus on Gratitude: Invite reflection on sources of gratitude (external/internal); goal to invite internal gratitude to foster 1) reconnection with life-giving activities 2) self-compassion.   Following a brief framework of chaplains role and ground rules of group behavior, participants are invited to share concerns or questions that engage spiritual life. Emphasis placed on common themes and shared experiences and ways to make meaning and clarify living into ones values.   Theory/Process/Goal: Utilize the theoretical framework of group therapy established by Celena Kite, Relational Cultural Theory and Rogerian approaches to facilitate relational empathy and use of the here and now to foster reflection, self-awareness, and sharing.   Observations: Richard Moon participated minimally in group discussion but responded when invited directly.  Richard Moon [employer] [facility]Div

## 2024-06-09 NOTE — [facility] Counselor (Signed)
 CSW attempted contact with admission coordinator, [name] from [facility] [phone]). Contact was unable to be established but HIPAA compliant voicemail left with contact information for follow through.   [name]. Chaim, MSW, LCSW, LCAS 06/09/2024 4:05 PM

## 2024-06-09 NOTE — Progress Notes (Signed)
 [facility] MD Progress Note  06/09/2024 11:19 PM Richard Moon  MRN:  [id number]  From initial presentation:   Richard Moon is a 63 y.o. year-old male presents to the ED with chief complaint of suicidal and homicidal thoughts.  States that his medications have not been working and he feels increasingly irritable.  States that he is now having thoughts of wanting to hurt himself and hurt others.  States that he has been using cocaine, and then uses alcohol to try and go to sleep.  He denies any recent illnesses.  Denies fever, cough, cold symptoms.  Subjective:  Chart reviewed, case discussed in multidisciplinary meeting, patient seen during rounds.   12/19: Patient is found resting in his room. He engages with clinical research associate and is cooperative. He reports that he is doing okay. He is experiencing anxiety at times rating it a 9/10. He rates his depression a 7/10. He endorses using cocaine about three days ago and uses daily. He endorses also using THC occasionally.   Denies SI, HI, and VH. He initially endorses AH, however it does not appear to be due to primary thought disorder. He described the AH as him having a conversation with myself and that it is just one voice. He endorses also hearing bells and shit sometimes.   Past Psychiatric History: see h&P Family History: History reviewed. No pertinent family history. Social History:  Social History   Substance and Sexual Activity  Alcohol Use Yes   Comment: Occasional alcohol use     Social History   Substance and Sexual Activity  Drug Use Yes   Types: Marijuana, Cocaine   Comment: daily cocaine use (reported $200-300/ daily); occasional cannabis use    Social History   Socioeconomic History   Marital status: Single    Spouse name: Not on file   Number of children: Not on file   Years of education: Not on file   Highest education level: Not on file  Occupational History   Not on file  Tobacco Use   Smoking status: Some Days     Current packs/day: 0.25    Average packs/day: 0.3 packs/day for 20.0 years (5.0 ttl pk-yrs)    Types: Cigarettes   Smokeless tobacco: Never  Vaping Use   Vaping status: Former  Substance and Sexual Activity   Alcohol use: Yes    Comment: Occasional alcohol use   Drug use: Yes    Types: Marijuana, Cocaine    Comment: daily cocaine use (reported $200-300/ daily); occasional cannabis use   Sexual activity: Yes    Birth control/protection: Condom  Other Topics Concern   Not on file  Social History Narrative   Not on file   Social Drivers of Health   Tobacco Use: High Risk (06/06/2024)   Patient History    Smoking Tobacco Use: Some Days    Smokeless Tobacco Use: Never    Passive Exposure: Not on file  Financial Resource Strain: Not on file  Food Insecurity: Food Insecurity Present (06/06/2024)   Epic    Worried About Programme Researcher, Broadcasting/film/video in the Last Year: Sometimes true    Ran Out of Food in the Last Year: Sometimes true  Transportation Needs: Unmet Transportation Needs (06/06/2024)   Epic    Lack of Transportation (Medical): Yes    Lack of Transportation (Non-Medical): Yes  Physical Activity: Not on file  Stress: Not on file  Social Connections: Not on file  Depression (PHQ2-9): High Risk (06/08/2023)   Depression (PHQ2-9)  PHQ-2 Score: 12  Alcohol Screen: Medium Risk (06/06/2024)   Alcohol Screen    Last Alcohol Screening Score (AUDIT): 14  Housing: High Risk (06/06/2024)   Epic    Unable to Pay for Housing in the Last Year: Yes    Number of Times Moved in the Last Year: 4    Homeless in the Last Year: Yes  Utilities: Not At Risk (06/06/2024)   Epic    Threatened with loss of utilities: No  Health Literacy: Not on file   Past Medical History:  Past Medical History:  Diagnosis Date   Cocaine abuse (HCC)    Homeless    Hypothyroidism 03/04/2024   Hypovitaminosis D 03/04/2024   Major depressive disorder, recurrent severe without psychotic features (HCC)     Prediabetes 03/04/2024   Substance induced mood disorder (HCC)     Past Surgical History:  Procedure Laterality Date   HERNIA REPAIR      Current Medications: Current Facility-Administered Medications  Medication Dose Route Frequency Provider Last Rate Last Admin   acetaminophen  (TYLENOL ) tablet 650 mg  650 mg Oral Q6H PRN Coleman, Carolyn H, NP   650 mg at 06/09/24 2119   alum & mag hydroxide-simeth (MAALOX/MYLANTA) 200-200-20 MG/5ML suspension 30 mL  30 mL Oral Q4H PRN Mardy Elveria DEL, NP       chlordiazePOXIDE  (LIBRIUM ) capsule 25 mg  25 mg Oral Q6H PRN Hunter, Crystal L, PA-C       haloperidol  (HALDOL ) tablet 5 mg  5 mg Oral TID PRN Mardy Elveria DEL, NP       And   diphenhydrAMINE  (BENADRYL ) capsule 50 mg  50 mg Oral TID PRN Mardy Elveria DEL, NP       haloperidol  lactate (HALDOL ) injection 5 mg  5 mg Intramuscular TID PRN Mardy Elveria DEL, NP       And   diphenhydrAMINE  (BENADRYL ) injection 50 mg  50 mg Intramuscular TID PRN Mardy Elveria DEL, NP       And   LORazepam  (ATIVAN ) injection 2 mg  2 mg Intramuscular TID PRN Mardy Elveria DEL, NP       haloperidol  lactate (HALDOL ) injection 10 mg  10 mg Intramuscular TID PRN Mardy Elveria DEL, NP       And   diphenhydrAMINE  (BENADRYL ) injection 50 mg  50 mg Intramuscular TID PRN Mardy Elveria DEL, NP       And   LORazepam  (ATIVAN ) injection 2 mg  2 mg Intramuscular TID PRN Mardy Elveria DEL, NP       feeding supplement (ENSURE PLUS HIGH PROTEIN) liquid 237 mL  237 mL Oral TID BM Jadapalle, Sree, MD   237 mL at 06/09/24 2120   hydrOXYzine  (ATARAX ) tablet 50 mg  50 mg Oral Q6H PRN Mardy Elveria DEL, NP   50 mg at 06/09/24 1330   levothyroxine  (SYNTHROID ) tablet 25 mcg  25 mcg Oral Q0600 Mardy Elveria DEL, NP   25 mcg at 06/09/24 9391   magnesium  hydroxide (MILK OF MAGNESIA) suspension 30 mL  30 mL Oral Daily PRN Mardy Elveria DEL, NP       multivitamin with minerals tablet 1 tablet  1 tablet Oral Daily Jadapalle, Sree, MD    1 tablet at 06/09/24 9157   nicotine  (NICODERM CQ  - dosed in mg/24 hours) patch 14 mg  14 mg Transdermal Daily Jadapalle, Sree, MD   14 mg at 06/08/24 9196   nicotine  polacrilex (NICORETTE ) gum 2 mg  2 mg Oral PRN Jadapalle, Sree, MD  2 mg at 06/07/24 9171   sertraline  (ZOLOFT ) tablet 50 mg  50 mg Oral Daily Coleman, Carolyn H, NP   50 mg at 06/09/24 9157   traZODone  (DESYREL ) tablet 25 mg  25 mg Oral QHS PRN Hunter, Crystal L, PA-C   25 mg at 06/09/24 2119    Lab Results:  Results for orders placed or performed during the hospital encounter of 06/06/24 (from the past 48 hours)  Hemoglobin A1c     Status: None   Collection Time: 06/09/24  6:24 AM  Result Value Ref Range   Hgb A1c MFr Bld 5.6 4.8 - 5.6 %    Comment: (NOTE) Diagnosis of Diabetes The following HbA1c ranges recommended by the American Diabetes Association (ADA) Kenora Spayd be used as an aid in the diagnosis of diabetes mellitus.  Hemoglobin             Suggested A1C NGSP%              Diagnosis  <5.7                   Non Diabetic  5.7-6.4                Pre-Diabetic  >6.4                   Diabetic  <7.0                   Glycemic control for                       adults with diabetes.     Mean Plasma Glucose 114.02 mg/dL    Comment: Performed at [facility], [address], [place], [place] [address]  Lipid panel     Status: None   Collection Time: 06/09/24  6:24 AM  Result Value Ref Range   Cholesterol 178 0 - 200 mg/dL    Comment:        ATP III CLASSIFICATION:  <200     mg/dL   Desirable  799-760  mg/dL   Borderline High  >=759    mg/dL   High           Triglycerides 92 <150 mg/dL   HDL 81 >59 mg/dL   Total CHOL/HDL Ratio 2.2 RATIO   VLDL 18 0 - 40 mg/dL   LDL Cholesterol 78 0 - 99 mg/dL    Comment:        Total Cholesterol/HDL:CHD Risk Coronary Heart Disease Risk Table                     Men   Women  1/2 Average Risk   3.4   3.3  Average Risk       5.0   4.4  2 X Average Risk   9.6   7.1   3 X Average Risk  23.4   11.0        Use the calculated Patient Ratio above and the CHD Risk Table to determine the patient's CHD Risk.        ATP III CLASSIFICATION (LDL):  <100     mg/dL   Optimal  899-870  mg/dL   Near or Above                    Optimal  130-159  mg/dL   Borderline  839-810  mg/dL   High  >809  mg/dL   Very High Performed at [facility], [address] [place], [place] [address]     Blood Alcohol level:  Lab Results  Component Value Date   [facility] <15 06/05/2024   ETH 43 (H) 02/29/2024    Metabolic Disorder Labs: Lab Results  Component Value Date   HGBA1C 5.6 06/09/2024   MPG 114.02 06/09/2024   MPG 122.63 03/02/2024   No results found for: PROLACTIN Lab Results  Component Value Date   CHOL 178 06/09/2024   TRIG 92 06/09/2024   HDL 81 06/09/2024   CHOLHDL 2.2 06/09/2024   VLDL 18 06/09/2024   LDLCALC 78 06/09/2024   LDLCALC 100 (H) 03/02/2024    Physical Findings: AIMS:  , ,  ,  ,    CIWA:  CIWA-Ar Total: 0 COWS:      Psychiatric Specialty Exam:  Presentation  General Appearance:  Appropriate for Environment; Casual  Eye Contact: Fair  Speech: Clear and Coherent  Speech Volume: Normal    Mood and Affect  Mood: Euthymic  Affect: Appropriate   Thought Process  Thought Processes: Coherent  Orientation:Full (Time, Place and Person)  Thought Content:WDL  Hallucinations: Auditory hallucinations - noises Ideas of Reference:None  Suicidal Thoughts: Denies Homicidal Thoughts:Homicidal Thoughts: No    Sensorium  Memory: Immediate Fair; Recent Fair  Judgment: Poor  Insight: Fair   Chartered Certified Accountant: Fair  Attention Span: Fair  Recall: [employer] of Knowledge: Fair  Language: Fair   Psychomotor Activity  Psychomotor Activity: Psychomotor Activity: Normal   Musculoskeletal: Strength & Muscle Tone: within normal limits Gait & Station: normal Assets   Assets: Manufacturing Systems Engineer; Desire for Improvement    Physical Exam: Physical Exam Vitals and nursing note reviewed.  Constitutional:      Appearance: Normal appearance.  Pulmonary:     Effort: Pulmonary effort is normal.  Neurological:     Mental Status: He is alert and oriented to person, place, and time.  Psychiatric:        Mood and Affect: Mood normal.        Behavior: Behavior normal.    Review of Systems  Respiratory:  Negative for shortness of breath.   Cardiovascular:  Negative for chest pain.  Gastrointestinal:  Negative for diarrhea, nausea and vomiting.  Psychiatric/Behavioral:  Positive for substance abuse. Negative for depression, hallucinations and suicidal ideas. The patient is not nervous/anxious.   All other systems reviewed and are negative.  Blood pressure 115/80, pulse 67, temperature 98.2 F (36.8 C), temperature source Oral, resp. rate 18, height 5' 9 (1.753 m), weight 64.4 kg, SpO2 100%. Body mass index is 20.97 kg/m.  Diagnosis: Active Problems:   MDD (major depressive disorder), recurrent, severe, with psychosis (HCC)   Treatment Plan Summary:   Safety and Monitoring:             -- Voluntary admission to inpatient psychiatric unit for safety, stabilization and treatment             -- Daily contact with patient to assess and evaluate symptoms and progress in treatment             -- Patient's case to be discussed in multi-disciplinary team meeting             -- Observation Level: q15 minute checks             -- Vital signs:  q12 hours             --  Precautions: suicide, elopement, and assault   2. Psychiatric Diagnoses and Treatment:   - Zoloft  75 mg once daily           - Trazodone  25 mg at bedtime as needed for sleep - dose decreased per patient request due to feeling 50 mg was too much - CIWA protocol - As needed Librium    -- The risks/benefits/side-effects/alternatives to this medication were discussed in detail with the  patient and time was given for questions. The patient consents to medication trial.                -- Metabolic profile and EKG monitoring obtained while on an atypical antipsychotic (BMI: Lipid Panel: HbgA1c: QTc:)              -- Encouraged patient to participate in unit milieu and in scheduled group therapies                            3. Medical Issues Being Addressed:  No acute concerns.    4. Discharge Planning:   -- Social work and case management to assist with discharge planning and identification of hospital follow-up needs prior to discharge  -- Estimated LOS: 5-7 days  Julene Rahn, NP 06/09/2024, 11:19 PM

## 2024-06-10 DIAGNOSIS — F333 Major depressive disorder, recurrent, severe with psychotic symptoms: Secondary | ICD-10-CM | POA: Diagnosis not present

## 2024-06-10 NOTE — Plan of Care (Signed)

## 2024-06-10 NOTE — Group Note (Signed)
 Date:  06/10/2024 Time:  8:52 PM  Group Topic/Focus:  Self Esteem Action Plan:   The focus of this group is to help patients create a plan to continue to build self-esteem after discharge.    Participation Level:  Active  Participation Quality:  Appropriate  Affect:  Appropriate  Cognitive:  Appropriate  Insight: Appropriate  Engagement in Group:  Engaged  Modes of Intervention:  Discussion  Additional Comments:    Armstead Heiland L 06/10/2024, 8:52 PM

## 2024-06-10 NOTE — Progress Notes (Signed)
" °   06/10/24 1300  Psychosocial Assessment  Patient Complaints Anxiety  Eye Contact Fair  Facial Expression Flat  Affect Depressed  Speech Logical/coherent  Interaction Assertive  Motor Activity Other (Comment)  Appearance/Hygiene Unremarkable  Behavior Characteristics Cooperative  Mood Depressed;Anxious  Aggressive Behavior  Effect No apparent injury  Thought Process  Coherency WDL  Content WDL  Delusions None reported or observed  Perception WDL  Hallucination None reported or observed  Judgment Limited  Confusion None  Danger to Self  Current suicidal ideation? Denies  Description of Suicide Plan no plan  Agreement Not to Harm Self Yes  Description of Agreement verbal  Danger to Others  Danger to Others None reported or observed    "

## 2024-06-10 NOTE — Group Note (Deleted)
 Date:  06/10/2024 Time:  8:44 PM  Group Topic/Focus:  Self Esteem Action Plan:   The focus of this group is to help patients create a plan to continue to build self-esteem after discharge.     Participation Level:  {[facility] PARTICIPATION OZCZO:77735}  Participation Quality:  {[facility] PARTICIPATION QUALITY:22265}  Affect:  {[facility] AFFECT:22266}  Cognitive:  {[facility] COGNITIVE:22267}  Insight: {[facility] Insight2:20797}  Engagement in Group:  {[facility] ENGAGEMENT IN HMNLE:77731}  Modes of Intervention:  {[facility] MODES OF INTERVENTION:22269}  Additional Comments:  ***  Richard Moon 06/10/2024, 8:44 PM

## 2024-06-10 NOTE — Progress Notes (Signed)
" °   06/10/24 0530  Psych Admission Type (Psych Patients Only)  Admission Status Voluntary  Psychosocial Assessment  Patient Complaints Anxiety;Depression  Eye Contact Fair  Facial Expression Flat  Affect Depressed  Speech Logical/coherent  Interaction Assertive  Motor Activity Other (Comment)  Appearance/Hygiene Unremarkable  Behavior Characteristics Cooperative;Appropriate to situation  Mood Depressed;Anxious  Aggressive Behavior  Targets Self  Effect No apparent injury  Thought Process  Coherency WDL  Content WDL  Delusions None reported or observed  Perception WDL  Hallucination None reported or observed  Judgment Limited  Confusion None  Danger to Self  Current suicidal ideation? Denies  Description of Suicide Plan No Plan  Self-Injurious Behavior No self-injurious ideation or behavior indicators observed or expressed   Agreement Not to Harm Self Yes  Description of Agreement Verbal  Danger to Others  Danger to Others None reported or observed    "

## 2024-06-10 NOTE — Group Note (Signed)
"                                                 [facility] LCSW Group Therapy Note    Group Date: 06/10/2024 Start Time: 1520 End Time: 1600  Type of Therapy and Topic:  Group Therapy:  Overcoming Obstacles  Participation Level:  [facility] PARTICIPATION LEVEL: Active  Mood: calm, pleasant  Description of Group:   In this group patients will be encouraged to explore what they see as obstacles to their own wellness and recovery. They will be guided to discuss their thoughts, feelings, and behaviors related to these obstacles. The group will process together ways to cope with barriers, with attention given to specific choices patients can make. Each patient will be challenged to identify changes they are motivated to make in order to overcome their obstacles. This group will be process-oriented, with patients participating in exploration of their own experiences as well as giving and receiving support and challenge from other group members.  Therapeutic Goals: 1. Patient will identify personal and current obstacles as they relate to admission. 2. Patient will identify barriers that currently interfere with their wellness or overcoming obstacles.  3. Patient will identify feelings, thought process and behaviors related to these barriers. 4. Patient will identify two changes they are willing to make to overcome these obstacles:    Summary of Patient Progress The facilitator and patient discussed things they could control and not control.  Group members create an individual statement on what they could not control.  The facilitator and patient examine how different experiences can influence their mood.  The patient reflected on how their behaviors shape their current control and things that they can not control.  This distinction allows the patient to honor their limitations and channel their efforts where they will make a difference.     Therapeutic Modalities:   Cognitive Behavioral Therapy Solution  Focused Therapy Motivational Interviewing Relapse Prevention Therapy   Rexene LELON Mae, LCSWA "

## 2024-06-10 NOTE — Progress Notes (Signed)
 [facility] MD Progress Note  06/10/2024 5:55 PM Richard Moon  MRN:  [id number]  From initial presentation:   Richard Moon is a 63 y.o. year-old male presents to the ED with chief complaint of suicidal and homicidal thoughts.  States that his medications have not been working and he feels increasingly irritable.  States that he is now having thoughts of wanting to hurt himself and hurt others.  States that he has been using cocaine, and then uses alcohol to try and go to sleep.  He denies any recent illnesses.  Denies fever, cough, cold symptoms.  Subjective:  Chart reviewed, case discussed in multidisciplinary meeting, patient seen during rounds.   12/19: Patient found resting in his room. He reports his mood as down. He denies SI, HI, and AVH. He reports he is tired. Endorses sleeping adequately and that he sleeps a lot. Endorses eating adequately as well. Patient reports wanting to engage with social work for housing and rehabilitation.   12/19: Patient is found resting in his room. He engages with clinical research associate and is cooperative. He reports that he is doing okay. He is experiencing anxiety at times rating it a 9/10. He rates his depression a 7/10. He endorses using cocaine about three days ago and uses daily. He endorses also using THC occasionally.   Denies SI, HI, and VH. He initially endorses AH, however it does not appear to be due to primary thought disorder. He described the AH as him having a conversation with myself and that it is just one voice. He endorses also hearing bells and shit sometimes.   Past Psychiatric History: see h&P Family History: History reviewed. No pertinent family history. Social History:  Social History   Substance and Sexual Activity  Alcohol Use Yes   Comment: Occasional alcohol use     Social History   Substance and Sexual Activity  Drug Use Yes   Types: Marijuana, Cocaine   Comment: daily cocaine use (reported $200-300/ daily); occasional  cannabis use    Social History   Socioeconomic History   Marital status: Single    Spouse name: Not on file   Number of children: Not on file   Years of education: Not on file   Highest education level: Not on file  Occupational History   Not on file  Tobacco Use   Smoking status: Some Days    Current packs/day: 0.25    Average packs/day: 0.3 packs/day for 20.0 years (5.0 ttl pk-yrs)    Types: Cigarettes   Smokeless tobacco: Never  Vaping Use   Vaping status: Former  Substance and Sexual Activity   Alcohol use: Yes    Comment: Occasional alcohol use   Drug use: Yes    Types: Marijuana, Cocaine    Comment: daily cocaine use (reported $200-300/ daily); occasional cannabis use   Sexual activity: Yes    Birth control/protection: Condom  Other Topics Concern   Not on file  Social History Narrative   Not on file   Social Drivers of Health   Tobacco Use: High Risk (06/06/2024)   Patient History    Smoking Tobacco Use: Some Days    Smokeless Tobacco Use: Never    Passive Exposure: Not on file  Financial Resource Strain: Not on file  Food Insecurity: Food Insecurity Present (06/06/2024)   Epic    Worried About Programme Researcher, Broadcasting/film/video in the Last Year: Sometimes true    [employer] of Food in the Last Year: Sometimes true  Transportation Needs:  Unmet Transportation Needs (06/06/2024)   Epic    Lack of Transportation (Medical): Yes    Lack of Transportation (Non-Medical): Yes  Physical Activity: Not on file  Stress: Not on file  Social Connections: Not on file  Depression (PHQ2-9): High Risk (06/08/2023)   Depression (PHQ2-9)    PHQ-2 Score: 12  Alcohol Screen: Medium Risk (06/06/2024)   Alcohol Screen    Last Alcohol Screening Score (AUDIT): 14  Housing: High Risk (06/06/2024)   Epic    Unable to Pay for Housing in the Last Year: Yes    Number of Times Moved in the Last Year: 4    Homeless in the Last Year: Yes  Utilities: Not At Risk (06/06/2024)   Epic    Threatened  with loss of utilities: No  Health Literacy: Not on file   Past Medical History:  Past Medical History:  Diagnosis Date   Cocaine abuse (HCC)    Homeless    Hypothyroidism 03/04/2024   Hypovitaminosis D 03/04/2024   Major depressive disorder, recurrent severe without psychotic features (HCC)    Prediabetes 03/04/2024   Substance induced mood disorder (HCC)     Past Surgical History:  Procedure Laterality Date   HERNIA REPAIR      Current Medications: Current Facility-Administered Medications  Medication Dose Route Frequency Provider Last Rate Last Admin   acetaminophen  (TYLENOL ) tablet 650 mg  650 mg Oral Q6H PRN Coleman, Carolyn H, NP   650 mg at 06/09/24 2119   alum & mag hydroxide-simeth (MAALOX/MYLANTA) 200-200-20 MG/5ML suspension 30 mL  30 mL Oral Q4H PRN Mardy Elveria DEL, NP       haloperidol  (HALDOL ) tablet 5 mg  5 mg Oral TID PRN Mardy Elveria DEL, NP       And   diphenhydrAMINE  (BENADRYL ) capsule 50 mg  50 mg Oral TID PRN Mardy Elveria DEL, NP       haloperidol  lactate (HALDOL ) injection 5 mg  5 mg Intramuscular TID PRN Mardy Elveria DEL, NP       And   diphenhydrAMINE  (BENADRYL ) injection 50 mg  50 mg Intramuscular TID PRN Mardy Elveria DEL, NP       And   LORazepam  (ATIVAN ) injection 2 mg  2 mg Intramuscular TID PRN Mardy Elveria DEL, NP       haloperidol  lactate (HALDOL ) injection 10 mg  10 mg Intramuscular TID PRN Mardy Elveria DEL, NP       And   diphenhydrAMINE  (BENADRYL ) injection 50 mg  50 mg Intramuscular TID PRN Mardy Elveria DEL, NP       And   LORazepam  (ATIVAN ) injection 2 mg  2 mg Intramuscular TID PRN Mardy Elveria DEL, NP       feeding supplement (ENSURE PLUS HIGH PROTEIN) liquid 237 mL  237 mL Oral TID BM Jadapalle, Sree, MD   237 mL at 06/10/24 1654   hydrOXYzine  (ATARAX ) tablet 50 mg  50 mg Oral Q6H PRN Mardy Elveria DEL, NP   50 mg at 06/09/24 1330   levothyroxine  (SYNTHROID ) tablet 25 mcg  25 mcg Oral Q0600 Mardy Elveria DEL, NP   25  mcg at 06/10/24 9192   magnesium  hydroxide (MILK OF MAGNESIA) suspension 30 mL  30 mL Oral Daily PRN Mardy Elveria DEL, NP       multivitamin with minerals tablet 1 tablet  1 tablet Oral Daily Jadapalle, Sree, MD   1 tablet at 06/10/24 0805   nicotine  polacrilex (NICORETTE ) gum 2 mg  2 mg Oral  PRN Jadapalle, Sree, MD   2 mg at 06/07/24 9171   sertraline  (ZOLOFT ) tablet 75 mg  75 mg Oral Daily Lunden Mcleish, NP   75 mg at 06/10/24 0809   traZODone  (DESYREL ) tablet 25 mg  25 mg Oral QHS PRN Hunter, Crystal L, PA-C   25 mg at 06/09/24 2119    Lab Results:  Results for orders placed or performed during the hospital encounter of 06/06/24 (from the past 48 hours)  Hemoglobin A1c     Status: None   Collection Time: 06/09/24  6:24 AM  Result Value Ref Range   Hgb A1c MFr Bld 5.6 4.8 - 5.6 %    Comment: (NOTE) Diagnosis of Diabetes The following HbA1c ranges recommended by the American Diabetes Association (ADA) Kaicee Scarpino be used as an aid in the diagnosis of diabetes mellitus.  Hemoglobin             Suggested A1C NGSP%              Diagnosis  <5.7                   Non Diabetic  5.7-6.4                Pre-Diabetic  >6.4                   Diabetic  <7.0                   Glycemic control for                       adults with diabetes.     Mean Plasma Glucose 114.02 mg/dL    Comment: Performed at [facility], [address] [place], [place] [address]  Lipid panel     Status: None   Collection Time: 06/09/24  6:24 AM  Result Value Ref Range   Cholesterol 178 0 - 200 mg/dL    Comment:        ATP III CLASSIFICATION:  <200     mg/dL   Desirable  799-760  mg/dL   Borderline High  >=759    mg/dL   High           Triglycerides 92 <150 mg/dL   HDL 81 >59 mg/dL   Total CHOL/HDL Ratio 2.2 RATIO   VLDL 18 0 - 40 mg/dL   LDL Cholesterol 78 0 - 99 mg/dL    Comment:        Total Cholesterol/HDL:CHD Risk Coronary Heart Disease Risk Table                     Men   Women  1/2 Average  Risk   3.4   3.3  Average Risk       5.0   4.4  2 X Average Risk   9.6   7.1  3 X Average Risk  23.4   11.0        Use the calculated Patient Ratio above and the CHD Risk Table to determine the patient's CHD Risk.        ATP III CLASSIFICATION (LDL):  <100     mg/dL   Optimal  899-870  mg/dL   Near or Above                    Optimal  130-159  mg/dL   Borderline  839-810  mg/dL  High  >190     mg/dL   Very High Performed at [facility], [address] [place], [place] [address]     Blood Alcohol level:  Lab Results  Component Value Date   [facility] <15 06/05/2024   ETH 43 (H) 02/29/2024    Metabolic Disorder Labs: Lab Results  Component Value Date   HGBA1C 5.6 06/09/2024   MPG 114.02 06/09/2024   MPG 122.63 03/02/2024   No results found for: PROLACTIN Lab Results  Component Value Date   CHOL 178 06/09/2024   TRIG 92 06/09/2024   HDL 81 06/09/2024   CHOLHDL 2.2 06/09/2024   VLDL 18 06/09/2024   LDLCALC 78 06/09/2024   LDLCALC 100 (H) 03/02/2024    Physical Findings: AIMS:  , ,  ,  ,    CIWA:  CIWA-Ar Total: 0 COWS:      Psychiatric Specialty Exam:  Presentation  General Appearance:  Appropriate for Environment; Casual  Eye Contact: Fair  Speech: Clear and Coherent; Normal Rate  Speech Volume: Normal    Mood and Affect  Mood: Euthymic  Affect: Appropriate   Thought Process  Thought Processes: Coherent; Linear  Orientation:Full (Time, Place and Person)  Thought Content:Logical  Hallucinations: Denies Ideas of Reference:None  Suicidal Thoughts: Denies Homicidal Thoughts:Homicidal Thoughts: No    Sensorium  Memory: Immediate Fair; Recent Fair  Judgment: Fair  Insight: Fair   Art Therapist  Concentration: Fair  Attention Span: Fair  Recall: [employer] of Knowledge: Fair  Language: Fair   Psychomotor Activity  Psychomotor Activity: Psychomotor Activity:  Normal   Musculoskeletal: Strength & Muscle Tone: within normal limits Gait & Station: normal Assets  Assets: Manufacturing Systems Engineer; Desire for Improvement    Physical Exam: Physical Exam Vitals and nursing note reviewed.  Constitutional:      Appearance: Normal appearance.  Pulmonary:     Effort: Pulmonary effort is normal.  Neurological:     Mental Status: He is alert and oriented to person, place, and time.  Psychiatric:        Mood and Affect: Mood normal.        Behavior: Behavior normal.    Review of Systems  Respiratory:  Negative for shortness of breath.   Cardiovascular:  Negative for chest pain.  Gastrointestinal:  Negative for diarrhea, nausea and vomiting.  Psychiatric/Behavioral:  Positive for substance abuse. Negative for depression, hallucinations and suicidal ideas. The patient is not nervous/anxious.   All other systems reviewed and are negative.  Blood pressure 113/66, pulse 69, temperature 98 F (36.7 C), temperature source Oral, resp. rate 18, height 5' 9 (1.753 m), weight 64.4 kg, SpO2 100%. Body mass index is 20.97 kg/m.  Diagnosis: Active Problems:   MDD (major depressive disorder), recurrent, severe, with psychosis (HCC)   Treatment Plan Summary:   Safety and Monitoring:             -- Voluntary admission to inpatient psychiatric unit for safety, stabilization and treatment             -- Daily contact with patient to assess and evaluate symptoms and progress in treatment             -- Patient's case to be discussed in multi-disciplinary team meeting             -- Observation Level: q15 minute checks             -- Vital signs:  q12 hours             --  Precautions: suicide, elopement, and assault   2. Psychiatric Diagnoses and Treatment:   - Zoloft  75 mg once daily           - Trazodone  25 mg at bedtime as needed for sleep - dose decreased per patient request due to feeling 50 mg was too much - CIWA protocol - As needed  Librium   Continue to monitor patient, he Aziyah Provencal be metabolizing substances which is contributing to overall tiredness/increased sleeping. Previous UDS positive for Cocaine.    -- The risks/benefits/side-effects/alternatives to this medication were discussed in detail with the patient and time was given for questions. The patient consents to medication trial.                -- Metabolic profile and EKG monitoring obtained while on an atypical antipsychotic (BMI: Lipid Panel: HbgA1c: QTc:)              -- Encouraged patient to participate in unit milieu and in scheduled group therapies                            3. Medical Issues Being Addressed:  No acute concerns.    4. Discharge Planning:   -- Social work and case management to assist with discharge planning and identification of hospital follow-up needs prior to discharge  -- Estimated LOS: 5-7 days  Antwaine Boomhower, NP 06/10/2024, 5:55 PM

## 2024-06-10 NOTE — Group Note (Signed)
 Date:  06/10/2024 Time:  10:09 AM  Group Topic/Focus:  Managing Feelings:   The focus of this group is to identify what feelings patients have difficulty handling and develop a plan to handle them in a healthier way upon discharge.    Participation Level:  Did Not Attend   Richard Moon 06/10/2024, 10:09 AM

## 2024-06-11 DIAGNOSIS — F333 Major depressive disorder, recurrent, severe with psychotic symptoms: Secondary | ICD-10-CM | POA: Diagnosis not present

## 2024-06-11 MED ORDER — SERTRALINE HCL 100 MG PO TABS
100.0000 mg | ORAL_TABLET | Freq: Every day | ORAL | Status: DC
  Administered 2024-06-12 – 2024-06-20 (×9): 100 mg via ORAL
  Filled 2024-06-11 (×7): qty 1

## 2024-06-11 NOTE — Progress Notes (Signed)
" °   06/10/24 2028  Psych Admission Type (Psych Patients Only)  Admission Status Voluntary  Psychosocial Assessment  Patient Complaints None  Eye Contact Fair  Facial Expression Flat  Affect Depressed  Speech Logical/coherent  Interaction Assertive  Motor Activity Other (Comment) (WDL)  Appearance/Hygiene Unremarkable  Behavior Characteristics Cooperative  Mood Anxious;Depressed  Aggressive Behavior  Effect No apparent injury  Thought Process  Coherency WDL  Content WDL  Delusions None reported or observed  Perception WDL  Hallucination None reported or observed  Judgment Limited  Confusion None  Danger to Self  Current suicidal ideation? Denies  Agreement Not to Harm Self Yes  Description of Agreement Verbal  Danger to Others  Danger to Others None reported or observed    "

## 2024-06-11 NOTE — Plan of Care (Signed)

## 2024-06-11 NOTE — Progress Notes (Signed)
 [facility] MD Progress Note  06/11/2024 4:15 PM Richard Moon  MRN:  [id number]  From initial presentation:   Richard Moon is a 63 y.o. year-old male presents to the ED with chief complaint of suicidal and homicidal thoughts.  States that his medications have not been working and he feels increasingly irritable.  States that he is now having thoughts of wanting to hurt himself and hurt others.  States that he has been using cocaine, and then uses alcohol to try and go to sleep.  He denies any recent illnesses.  Denies fever, cough, cold symptoms.  Subjective:  Chart reviewed, case discussed in multidisciplinary meeting, patient seen during rounds.   12/21: Patient found resting in his room. He reports he is doing alright. He denies SI, HI, and VH. He endorses AH of people crying. He expresses that he has come out of his room for food and groups. Endorses attending to ADLs. He reports that his mind is still racing while he is awake.   Denies concerns/complaints. He continues to remain interested in rehab/housing resources.   12/20: Patient found resting in his room. He reports his mood as down. He denies SI, HI, and AVH. He reports he is tired. Endorses sleeping adequately and that he sleeps a lot. Endorses eating adequately as well. Patient reports wanting to engage with social work for housing and rehabilitation.   12/19: Patient is found resting in his room. He engages with clinical research associate and is cooperative. He reports that he is doing okay. He is experiencing anxiety at times rating it a 9/10. He rates his depression a 7/10. He endorses using cocaine about three days ago and uses daily. He endorses also using THC occasionally.   Denies SI, HI, and VH. He initially endorses AH, however it does not appear to be due to primary thought disorder. He described the AH as him having a conversation with myself and that it is just one voice. He endorses also hearing bells and shit sometimes.    Past Psychiatric History: see h&P Family History: History reviewed. No pertinent family history. Social History:  Social History   Substance and Sexual Activity  Alcohol Use Yes   Comment: Occasional alcohol use     Social History   Substance and Sexual Activity  Drug Use Yes   Types: Marijuana, Cocaine   Comment: daily cocaine use (reported $200-300/ daily); occasional cannabis use    Social History   Socioeconomic History   Marital status: Single    Spouse name: Not on file   Number of children: Not on file   Years of education: Not on file   Highest education level: Not on file  Occupational History   Not on file  Tobacco Use   Smoking status: Some Days    Current packs/day: 0.25    Average packs/day: 0.3 packs/day for 20.0 years (5.0 ttl pk-yrs)    Types: Cigarettes   Smokeless tobacco: Never  Vaping Use   Vaping status: Former  Substance and Sexual Activity   Alcohol use: Yes    Comment: Occasional alcohol use   Drug use: Yes    Types: Marijuana, Cocaine    Comment: daily cocaine use (reported $200-300/ daily); occasional cannabis use   Sexual activity: Yes    Birth control/protection: Condom  Other Topics Concern   Not on file  Social History Narrative   Not on file   Social Drivers of Health   Tobacco Use: High Risk (06/06/2024)   Patient History  Smoking Tobacco Use: Some Days    Smokeless Tobacco Use: Never    Passive Exposure: Not on file  Financial Resource Strain: Not on file  Food Insecurity: Food Insecurity Present (06/06/2024)   Epic    Worried About Programme Researcher, Broadcasting/film/video in the Last Year: Sometimes true    Ran Out of Food in the Last Year: Sometimes true  Transportation Needs: Unmet Transportation Needs (06/06/2024)   Epic    Lack of Transportation (Medical): Yes    Lack of Transportation (Non-Medical): Yes  Physical Activity: Not on file  Stress: Not on file  Social Connections: Not on file  Depression (PHQ2-9): High Risk  (06/08/2023)   Depression (PHQ2-9)    PHQ-2 Score: 12  Alcohol Screen: Medium Risk (06/06/2024)   Alcohol Screen    Last Alcohol Screening Score (AUDIT): 14  Housing: High Risk (06/06/2024)   Epic    Unable to Pay for Housing in the Last Year: Yes    Number of Times Moved in the Last Year: 4    Homeless in the Last Year: Yes  Utilities: Not At Risk (06/06/2024)   Epic    Threatened with loss of utilities: No  Health Literacy: Not on file   Past Medical History:  Past Medical History:  Diagnosis Date   Cocaine abuse (HCC)    Homeless    Hypothyroidism 03/04/2024   Hypovitaminosis D 03/04/2024   Major depressive disorder, recurrent severe without psychotic features (HCC)    Prediabetes 03/04/2024   Substance induced mood disorder (HCC)     Past Surgical History:  Procedure Laterality Date   HERNIA REPAIR      Current Medications: Current Facility-Administered Medications  Medication Dose Route Frequency Provider Last Rate Last Admin   acetaminophen  (TYLENOL ) tablet 650 mg  650 mg Oral Q6H PRN Coleman, Carolyn H, NP   650 mg at 06/10/24 2109   alum & mag hydroxide-simeth (MAALOX/MYLANTA) 200-200-20 MG/5ML suspension 30 mL  30 mL Oral Q4H PRN Mardy Elveria DEL, NP       haloperidol  (HALDOL ) tablet 5 mg  5 mg Oral TID PRN Mardy Elveria DEL, NP       And   diphenhydrAMINE  (BENADRYL ) capsule 50 mg  50 mg Oral TID PRN Mardy Elveria DEL, NP       haloperidol  lactate (HALDOL ) injection 5 mg  5 mg Intramuscular TID PRN Mardy Elveria DEL, NP       And   diphenhydrAMINE  (BENADRYL ) injection 50 mg  50 mg Intramuscular TID PRN Mardy Elveria DEL, NP       And   LORazepam  (ATIVAN ) injection 2 mg  2 mg Intramuscular TID PRN Mardy Elveria DEL, NP       haloperidol  lactate (HALDOL ) injection 10 mg  10 mg Intramuscular TID PRN Mardy Elveria DEL, NP       And   diphenhydrAMINE  (BENADRYL ) injection 50 mg  50 mg Intramuscular TID PRN Mardy Elveria DEL, NP       And   LORazepam   (ATIVAN ) injection 2 mg  2 mg Intramuscular TID PRN Mardy Elveria DEL, NP       feeding supplement (ENSURE PLUS HIGH PROTEIN) liquid 237 mL  237 mL Oral TID BM Jadapalle, Sree, MD   237 mL at 06/11/24 1520   hydrOXYzine  (ATARAX ) tablet 50 mg  50 mg Oral Q6H PRN Coleman, Carolyn H, NP   50 mg at 06/09/24 1330   levothyroxine  (SYNTHROID ) tablet 25 mcg  25 mcg Oral Q0600 Mardy Elveria  H, NP   25 mcg at 06/11/24 9371   magnesium  hydroxide (MILK OF MAGNESIA) suspension 30 mL  30 mL Oral Daily PRN Coleman, Carolyn H, NP       multivitamin with minerals tablet 1 tablet  1 tablet Oral Daily Jadapalle, Sree, MD   1 tablet at 06/11/24 0859   nicotine  polacrilex (NICORETTE ) gum 2 mg  2 mg Oral PRN Jadapalle, Sree, MD   2 mg at 06/07/24 9171   sertraline  (ZOLOFT ) tablet 75 mg  75 mg Oral Daily Amillion Scobee, NP   75 mg at 06/11/24 9140   traZODone  (DESYREL ) tablet 25 mg  25 mg Oral QHS PRN Hunter, Crystal L, PA-C   25 mg at 06/10/24 2108    Lab Results:  No results found for this or any previous visit (from the past 48 hours).   Blood Alcohol level:  Lab Results  Component Value Date   ETH <15 06/05/2024   ETH 43 (H) 02/29/2024    Metabolic Disorder Labs: Lab Results  Component Value Date   HGBA1C 5.6 06/09/2024   MPG 114.02 06/09/2024   MPG 122.63 03/02/2024   No results found for: PROLACTIN Lab Results  Component Value Date   CHOL 178 06/09/2024   TRIG 92 06/09/2024   HDL 81 06/09/2024   CHOLHDL 2.2 06/09/2024   VLDL 18 06/09/2024   LDLCALC 78 06/09/2024   LDLCALC 100 (H) 03/02/2024    Physical Findings: AIMS:  , ,  ,  ,    CIWA:  CIWA-Ar Total: 0 COWS:      Psychiatric Specialty Exam:  Presentation  General Appearance:  Appropriate for Environment; Casual  Eye Contact: Fair  Speech: Clear and Coherent; Normal Rate  Speech Volume: Normal    Mood and Affect  Mood: Euthymic  Affect: Appropriate   Thought Process  Thought Processes: Coherent;  Linear  Orientation:Full (Time, Place and Person)  Thought Content:Logical  Hallucinations: Denies Ideas of Reference:None  Suicidal Thoughts: Denies Homicidal Thoughts:Homicidal Thoughts: No    Sensorium  Memory: Immediate Fair; Recent Fair  Judgment: Fair  Insight: Fair   Art Therapist  Concentration: Fair  Attention Span: Fair  Recall: [employer] of Knowledge: Fair  Language: Fair   Psychomotor Activity  Psychomotor Activity: Psychomotor Activity: Normal   Musculoskeletal: Strength & Muscle Tone: within normal limits Gait & Station: normal Assets  Assets: Manufacturing Systems Engineer; Desire for Improvement    Physical Exam: Physical Exam Vitals and nursing note reviewed.  Constitutional:      Appearance: Normal appearance.  Pulmonary:     Effort: Pulmonary effort is normal.  Neurological:     Mental Status: He is alert and oriented to person, place, and time.  Psychiatric:        Mood and Affect: Mood normal.        Behavior: Behavior normal.    Review of Systems  Respiratory:  Negative for shortness of breath.   Cardiovascular:  Negative for chest pain.  Gastrointestinal:  Negative for diarrhea, nausea and vomiting.  Psychiatric/Behavioral:  Positive for hallucinations and substance abuse. Negative for depression and suicidal ideas. The patient is nervous/anxious.   All other systems reviewed and are negative.  Blood pressure 109/83, pulse 68, temperature 98 F (36.7 C), temperature source Oral, resp. rate 19, height 5' 9 (1.753 m), weight 64.4 kg, SpO2 99%. Body mass index is 20.97 kg/m.  Diagnosis: Active Problems:   MDD (major depressive disorder), recurrent, severe, with psychosis (HCC)   Treatment Plan  Summary:   Safety and Monitoring:             -- Voluntary admission to inpatient psychiatric unit for safety, stabilization and treatment             -- Daily contact with patient to assess and evaluate symptoms and  progress in treatment             -- Patient's case to be discussed in multi-disciplinary team meeting             -- Observation Level: q15 minute checks             -- Vital signs:  q12 hours             -- Precautions: suicide, elopement, and assault   2. Psychiatric Diagnoses and Treatment:   - Zoloft  100 mg once daily          - Trazodone  25 mg at bedtime as needed for sleep - dose decreased per patient request due to feeling 50 mg was too much - CIWA protocol - As needed Librium      -- The risks/benefits/side-effects/alternatives to this medication were discussed in detail with the patient and time was given for questions. The patient consents to medication trial.                -- Metabolic profile and EKG monitoring obtained while on an atypical antipsychotic (BMI: Lipid Panel: HbgA1c: QTc:)              -- Encouraged patient to participate in unit milieu and in scheduled group therapies                            3. Medical Issues Being Addressed:  No acute concerns.    4. Discharge Planning:   -- Social work and case management to assist with discharge planning and identification of hospital follow-up needs prior to discharge  -- Estimated LOS: 5-7 days  Jaice Lague, NP 06/11/2024, 4:15 PM

## 2024-06-11 NOTE — Progress Notes (Signed)
 D- Patient alert and oriented. Pt in his room in the bed during assessment. Pt not responding a lot to clinical research associate questions. pt denies SI, HI, AVH, and pain at moment but endorses hearing someone crying at times. Pt then states, everything is good. I'm fine. Pt rolled to his left side while speaking with clinical research associate.   A- No scheduled/PRN medications administered to patient, per MD orders. Support and encouragement provided.  Routine safety checks conducted every 15 minutes.  Patient informed to notify staff with problems or concerns.  R- No adverse drug reactions noted. Patient contracts for safety at this time. Patient compliant with medications and treatment plan. Patient receptive, calm, and cooperative. Patient interacts well with others on the unit during meal times.  Patient remains safe at this time.    Kodah Maret S.,RN

## 2024-06-11 NOTE — Plan of Care (Signed)
" °  Problem: Activity: Goal: Interest or engagement in leisure activities will improve Outcome: Not Progressing Goal: Imbalance in normal sleep/wake cycle will improve Outcome: Not Progressing   Problem: Role Relationship: Goal: Will demonstrate positive changes in social behaviors and relationships Outcome: Not Progressing   "

## 2024-06-11 NOTE — Group Note (Signed)
 Date:  06/11/2024 Time:  10:44 AM  Group Topic/Focus:  Building Self Esteem:   The Focus of this group is helping patients become aware of the effects of self-esteem on their lives, the things they and others do that enhance or undermine their self-esteem, seeing the relationship between their level of self-esteem and the choices they make and learning ways to enhance self-esteem.    Participation Level:  Active  Participation Quality:  Appropriate  Affect:  Appropriate  Cognitive:  Appropriate  Insight: Appropriate  Engagement in Group:  Engaged and Supportive  Modes of Intervention:  Activity, Education, and Support  Additional Comments:    Richard Moon June 06/11/2024, 10:44 AM

## 2024-06-12 DIAGNOSIS — F323 Major depressive disorder, single episode, severe with psychotic features: Secondary | ICD-10-CM | POA: Diagnosis not present

## 2024-06-12 NOTE — Plan of Care (Signed)
" °  Problem: Education: Goal: Knowledge of the prescribed therapeutic regimen will improve Outcome: Progressing   Problem: Activity: Goal: Interest or engagement in leisure activities will improve Outcome: Progressing   Problem: Coping: Goal: Coping ability will improve Outcome: Progressing   Problem: Safety: Goal: Ability to disclose and discuss suicidal ideas will improve Outcome: Progressing   "

## 2024-06-12 NOTE — Group Note (Signed)
 Recreation Therapy Group Note   Group Topic:Coping Skills  Group Date: 06/12/2024 Start Time: 1515 End Time: 1550 Facilitators: Celestia Jeoffrey FORBES ARTICE, CTRS Location: Craft Room  Group Description: Mind Map.  Patient was provided a blank template of a diagram with 32 blank boxes in a tiered system, branching from the center (similar to a bubble chart). LRT directed patients to label the middle of the diagram Coping Skills. LRT and patients then came up with 8 different coping skills as examples. Pt were directed to record their coping skills in the 2nd tier boxes closest to the center.  Patients would then share their coping skills with the group as LRT wrote them out. LRT gave a handout of 99 different coping skills at the end of group.   Goal Area(s) Addressed: Patients will be able to define coping skills. Patient will identify new coping skills.  Patient will increase communication.   Affect/Mood: Appropriate   Participation Level: Moderate    Clinical Observations/Individualized Feedback: Richard Moon came late to group. Pt shared hiking and fishing as coping skills.   Plan: Continue to engage patient in RT group sessions 2-3x/week.   Jeoffrey FORBES Celestia, LRT, CTRS 06/12/2024 5:07 PM

## 2024-06-12 NOTE — [facility] Counselor (Signed)
 CSW received notification that pt was not accepted at [facility] due to mental health being his primary diagnosis.   CSW completed referral for [facility] [phone]).   [name]. Chaim, MSW, LCSW, LCAS 06/12/2024 3:09 PM

## 2024-06-12 NOTE — Progress Notes (Addendum)
" °   06/11/24 2051  Psych Admission Type (Psych Patients Only)  Admission Status Voluntary  Psychosocial Assessment  Patient Complaints None  Eye Contact Fair  Facial Expression Other (Comment) (appropriate/WDL)  Affect Flat  Speech Logical/coherent  Interaction Assertive  Motor Activity Other (Comment) (WDL)  Appearance/Hygiene Unremarkable  Behavior Characteristics Cooperative;Appropriate to situation  Mood Anxious;Depressed  Aggressive Behavior  Effect No apparent injury  Thought Process  Coherency WDL  Content WDL  Delusions None reported or observed  Perception WDL  Hallucination None reported or observed  Judgment Limited  Confusion None  Danger to Self  Current suicidal ideation? Denies  Agreement Not to Harm Self Yes  Description of Agreement Verbal  Danger to Others  Danger to Others None reported or observed   Pt continues to rate anxiety and depression moderate.  He reported hearing voices like people crying. "

## 2024-06-12 NOTE — Progress Notes (Signed)
" °   06/12/24 1600  Psych Admission Type (Psych Patients Only)  Admission Status Voluntary  Psychosocial Assessment  Patient Complaints None  Eye Contact Fair  Facial Expression Flat  Affect Irritable  Speech Logical/coherent  Interaction Assertive  Motor Activity Slow  Appearance/Hygiene Unremarkable  Behavior Characteristics Cooperative  Mood Anxious;Depressed  Thought Process  Coherency WDL  Content WDL  Delusions None reported or observed  Perception WDL  Hallucination None reported or observed  Judgment Limited  Confusion None  Danger to Self  Current suicidal ideation? Denies  Agreement Not to Harm Self Yes  Description of Agreement verbal  Danger to Others  Danger to Others None reported or observed    "

## 2024-06-12 NOTE — Group Note (Signed)
"                                                 [facility] LCSW Group Therapy Note    Group Date: 06/12/2024 Start Time: 1300 End Time: 1400  Type of Therapy and Topic:  Group Therapy:  Overcoming Obstacles  Participation Level:  [facility] PARTICIPATION LEVEL: Minimal  Description of Group:   In this group patients will be encouraged to explore what they see as obstacles to their own wellness and recovery. They will be guided to discuss their thoughts, feelings, and behaviors related to these obstacles. The group will process together ways to cope with barriers, with attention given to specific choices patients can make. Each patient will be challenged to identify changes they are motivated to make in order to overcome their obstacles. This group will be process-oriented, with patients participating in exploration of their own experiences as well as giving and receiving support and challenge from other group members.  Therapeutic Goals: 1. Patient will identify personal and current obstacles as they relate to admission. 2. Patient will identify barriers that currently interfere with their wellness or overcoming obstacles.  3. Patient will identify feelings, thought process and behaviors related to these barriers. 4. Patient will identify two changes they are willing to make to overcome these obstacles:    Summary of Patient Progress Patient was present for the majority of the group process. He was minimally engaged in the discussion. His comments were pertinent to the discussion. Insight into topic remains questionable. He appeared open and receptive to feedback/comments from both his peers and the facilitator.   Therapeutic Modalities:   Cognitive Behavioral Therapy Solution Focused Therapy Motivational Interviewing Relapse Prevention Therapy   Nadara JONELLE Fam, LCSW "

## 2024-06-12 NOTE — Group Note (Signed)
 Date:  06/12/2024 Time:  9:45 PM  Group Topic/Focus:  Wrap-Up Group:   The focus of this group is to help patients review their daily goal of treatment and discuss progress on daily workbooks.    Participation Level:  Active  Participation Quality:  Appropriate  Affect:  Appropriate  Cognitive:  Appropriate  Insight: Appropriate  Engagement in Group:  Engaged  Modes of Intervention:  Orientation  Additional Comments:    Ginny JONETTA Galeazzi 06/12/2024, 9:45 PM

## 2024-06-12 NOTE — Plan of Care (Signed)

## 2024-06-12 NOTE — Progress Notes (Signed)
 [facility] MD Progress Note  06/12/2024 11:51 PM Richard Moon  MRN:  [id number]  From initial presentation:   Richard Moon is a 63 y.o. year-old male presents to the ED with chief complaint of suicidal and homicidal thoughts.  States that his medications have not been working and he feels increasingly irritable.  States that he is now having thoughts of wanting to hurt himself and hurt others.  States that he has been using cocaine, and then uses alcohol to try and go to sleep.  He denies any recent illnesses.  Denies fever, cough, cold symptoms.  Subjective:  Chart reviewed, case discussed in multidisciplinary meeting, patient seen during rounds.  06/12/2024: Patient is noted to be resting in bed.  He offers no complaints.  He reports ongoing depression and anxiety due to his homeless situation and not being accepted to inpatient rehab programs.  Patient reports working with [facility] before and is confused about their denial of acceptance.  He rates his depression as 7 out of 10, 10 being the worst and anxiety as 8 out of 10.  He reports having hyperstartle with loud sounds on the unit.  He denies SI/HI/plan and denies hallucination.  He is taking his medications with no reported side effect 12/21: Patient found resting in his room. He reports he is doing alright. He denies SI, HI, and VH. He endorses AH of people crying. He expresses that he has come out of his room for food and groups. Endorses attending to ADLs. He reports that his mind is still racing while he is awake.   Denies concerns/complaints. He continues to remain interested in rehab/housing resources.   12/20: Patient found resting in his room. He reports his mood as down. He denies SI, HI, and AVH. He reports he is tired. Endorses sleeping adequately and that he sleeps a lot. Endorses eating adequately as well. Patient reports wanting to engage with social work for housing and rehabilitation.   12/19: Patient is found  resting in his room. He engages with clinical research associate and is cooperative. He reports that he is doing okay. He is experiencing anxiety at times rating it a 9/10. He rates his depression a 7/10. He endorses using cocaine about three days ago and uses daily. He endorses also using THC occasionally.   Denies SI, HI, and VH. He initially endorses AH, however it does not appear to be due to primary thought disorder. He described the AH as him having a conversation with myself and that it is just one voice. He endorses also hearing bells and shit sometimes.   Past Psychiatric History: see h&P Family History: History reviewed. No pertinent family history. Social History:  Social History   Substance and Sexual Activity  Alcohol Use Yes   Comment: Occasional alcohol use     Social History   Substance and Sexual Activity  Drug Use Yes   Types: Marijuana, Cocaine   Comment: daily cocaine use (reported $200-300/ daily); occasional cannabis use    Social History   Socioeconomic History   Marital status: Single    Spouse name: Not on file   Number of children: Not on file   Years of education: Not on file   Highest education level: Not on file  Occupational History   Not on file  Tobacco Use   Smoking status: Some Days    Current packs/day: 0.25    Average packs/day: 0.3 packs/day for 20.0 years (5.0 ttl pk-yrs)    Types: Cigarettes   Smokeless tobacco:  Never  Vaping Use   Vaping status: Former  Substance and Sexual Activity   Alcohol use: Yes    Comment: Occasional alcohol use   Drug use: Yes    Types: Marijuana, Cocaine    Comment: daily cocaine use (reported $200-300/ daily); occasional cannabis use   Sexual activity: Yes    Birth control/protection: Condom  Other Topics Concern   Not on file  Social History Narrative   Not on file   Social Drivers of Health   Tobacco Use: High Risk (06/06/2024)   Patient History    Smoking Tobacco Use: Some Days    Smokeless Tobacco Use:  Never    Passive Exposure: Not on file  Financial Resource Strain: Not on file  Food Insecurity: Food Insecurity Present (06/06/2024)   Epic    Worried About Programme Researcher, Broadcasting/film/video in the Last Year: Sometimes true    Ran Out of Food in the Last Year: Sometimes true  Transportation Needs: Unmet Transportation Needs (06/06/2024)   Epic    Lack of Transportation (Medical): Yes    Lack of Transportation (Non-Medical): Yes  Physical Activity: Not on file  Stress: Not on file  Social Connections: Not on file  Depression (PHQ2-9): High Risk (06/08/2023)   Depression (PHQ2-9)    PHQ-2 Score: 12  Alcohol Screen: Medium Risk (06/06/2024)   Alcohol Screen    Last Alcohol Screening Score (AUDIT): 14  Housing: High Risk (06/06/2024)   Epic    Unable to Pay for Housing in the Last Year: Yes    Number of Times Moved in the Last Year: 4    Homeless in the Last Year: Yes  Utilities: Not At Risk (06/06/2024)   Epic    Threatened with loss of utilities: No  Health Literacy: Not on file   Past Medical History:  Past Medical History:  Diagnosis Date   Cocaine abuse (HCC)    Homeless    Hypothyroidism 03/04/2024   Hypovitaminosis D 03/04/2024   Major depressive disorder, recurrent severe without psychotic features (HCC)    Prediabetes 03/04/2024   Substance induced mood disorder (HCC)     Past Surgical History:  Procedure Laterality Date   HERNIA REPAIR      Current Medications: Current Facility-Administered Medications  Medication Dose Route Frequency Provider Last Rate Last Admin   acetaminophen  (TYLENOL ) tablet 650 mg  650 mg Oral Q6H PRN Coleman, Carolyn H, NP   650 mg at 06/12/24 2115   alum & mag hydroxide-simeth (MAALOX/MYLANTA) 200-200-20 MG/5ML suspension 30 mL  30 mL Oral Q4H PRN Mardy Elveria DEL, NP       haloperidol  (HALDOL ) tablet 5 mg  5 mg Oral TID PRN Mardy Elveria DEL, NP       And   diphenhydrAMINE  (BENADRYL ) capsule 50 mg  50 mg Oral TID PRN Mardy Elveria DEL, NP        haloperidol  lactate (HALDOL ) injection 5 mg  5 mg Intramuscular TID PRN Mardy Elveria DEL, NP       And   diphenhydrAMINE  (BENADRYL ) injection 50 mg  50 mg Intramuscular TID PRN Mardy Elveria DEL, NP       And   LORazepam  (ATIVAN ) injection 2 mg  2 mg Intramuscular TID PRN Mardy Elveria DEL, NP       haloperidol  lactate (HALDOL ) injection 10 mg  10 mg Intramuscular TID PRN Mardy Elveria DEL, NP       And   diphenhydrAMINE  (BENADRYL ) injection 50 mg  50 mg Intramuscular  TID PRN Mardy Elveria DEL, NP       And   LORazepam  (ATIVAN ) injection 2 mg  2 mg Intramuscular TID PRN Coleman, Carolyn H, NP       feeding supplement (ENSURE PLUS HIGH PROTEIN) liquid 237 mL  237 mL Oral TID BM Shervin Cypert, MD   237 mL at 06/12/24 2000   hydrOXYzine  (ATARAX ) tablet 50 mg  50 mg Oral Q6H PRN Coleman, Carolyn H, NP   50 mg at 06/12/24 2116   levothyroxine  (SYNTHROID ) tablet 25 mcg  25 mcg Oral Q0600 Coleman, Carolyn H, NP   25 mcg at 06/12/24 0532   magnesium  hydroxide (MILK OF MAGNESIA) suspension 30 mL  30 mL Oral Daily PRN Coleman, Carolyn H, NP       multivitamin with minerals tablet 1 tablet  1 tablet Oral Daily Siera Beyersdorf, MD   1 tablet at 06/12/24 0800   nicotine  polacrilex (NICORETTE ) gum 2 mg  2 mg Oral PRN Ariam Mol, MD   2 mg at 06/07/24 9171   sertraline  (ZOLOFT ) tablet 100 mg  100 mg Oral Daily May, Tanya, NP   100 mg at 06/12/24 0800   traZODone  (DESYREL ) tablet 25 mg  25 mg Oral QHS PRN Hunter, Crystal L, PA-C   25 mg at 06/12/24 2116    Lab Results:  No results found for this or any previous visit (from the past 48 hours).   Blood Alcohol level:  Lab Results  Component Value Date   ETH <15 06/05/2024   ETH 43 (H) 02/29/2024    Metabolic Disorder Labs: Lab Results  Component Value Date   HGBA1C 5.6 06/09/2024   MPG 114.02 06/09/2024   MPG 122.63 03/02/2024   No results found for: PROLACTIN Lab Results  Component Value Date   CHOL 178 06/09/2024   TRIG  92 06/09/2024   HDL 81 06/09/2024   CHOLHDL 2.2 06/09/2024   VLDL 18 06/09/2024   LDLCALC 78 06/09/2024   LDLCALC 100 (H) 03/02/2024    Physical Findings: AIMS:  , ,  ,  ,    CIWA:  CIWA-Ar Total: 0 COWS:      Psychiatric Specialty Exam:  Presentation  General Appearance:  Appropriate for Environment; Casual  Eye Contact: Fair  Speech: Clear and Coherent; Normal Rate  Speech Volume: Normal    Mood and Affect  Mood: Euthymic  Affect: Appropriate   Thought Process  Thought Processes: Coherent; Linear  Orientation:Full (Time, Place and Person)  Thought Content:Logical  Hallucinations: Denies Ideas of Reference:None  Suicidal Thoughts: Denies Homicidal Thoughts: Denies    Sensorium  Memory: Immediate Fair; Recent Fair  Judgment: Fair  Insight: Fair   Art Therapist  Concentration: Fair  Attention Span: Fair  Recall: [employer] of Knowledge: Fair  Language: Fair   Psychomotor Activity  Psychomotor Activity: No data recorded   Musculoskeletal: Strength & Muscle Tone: within normal limits Gait & Station: normal Assets  Assets: Manufacturing Systems Engineer; Desire for Improvement    Physical Exam: Physical Exam Vitals and nursing note reviewed.  Constitutional:      Appearance: Normal appearance.  Pulmonary:     Effort: Pulmonary effort is normal.  Neurological:     Mental Status: He is alert and oriented to person, place, and time.  Psychiatric:        Mood and Affect: Mood normal.        Behavior: Behavior normal.    Review of Systems  Respiratory:  Negative for shortness of  breath.   Cardiovascular:  Negative for chest pain.  Gastrointestinal:  Negative for diarrhea, nausea and vomiting.  Psychiatric/Behavioral:  Positive for hallucinations and substance abuse. Negative for depression and suicidal ideas. The patient is nervous/anxious.   All other systems reviewed and are negative.  Blood pressure 99/69,  pulse 65, temperature 97.9 F (36.6 C), temperature source Oral, resp. rate 16, height 5' 9 (1.753 m), weight 64.4 kg, SpO2 99%. Body mass index is 20.97 kg/m.  Diagnosis: Active Problems:   MDD (major depressive disorder), recurrent, severe, with psychosis (HCC)   Treatment Plan Summary:   Safety and Monitoring:             -- Voluntary admission to inpatient psychiatric unit for safety, stabilization and treatment             -- Daily contact with patient to assess and evaluate symptoms and progress in treatment             -- Patient's case to be discussed in multi-disciplinary team meeting             -- Observation Level: q15 minute checks             -- Vital signs:  q12 hours             -- Precautions: suicide, elopement, and assault   2. Psychiatric Diagnoses and Treatment:   - Zoloft  100 mg once daily          - Trazodone  25 mg at bedtime as needed for sleep - dose decreased per patient request due to feeling 50 mg was too much - CIWA protocol - As needed Librium      -- The risks/benefits/side-effects/alternatives to this medication were discussed in detail with the patient and time was given for questions. The patient consents to medication trial.                -- Metabolic profile and EKG monitoring obtained while on an atypical antipsychotic (BMI: Lipid Panel: HbgA1c: QTc:)              -- Encouraged patient to participate in unit milieu and in scheduled group therapies                            3. Medical Issues Being Addressed:  No acute concerns.    4. Discharge Planning:   -- Social work and case management to assist with discharge planning and identification of hospital follow-up needs prior to discharge  -- Estimated LOS: 5-7 days  Allyn Foil, MD 06/12/2024, 11:51 PM

## 2024-06-12 NOTE — Group Note (Signed)
 Date:  06/12/2024 Time:  12:12 PM  Group Topic/Focus:  Goals Group:   The focus of this group is to help patients establish daily goals to achieve during treatment and discuss how the patient can incorporate goal setting into their daily lives to aide in recovery. We also went over positive thinking and redirecting negative thoughts to positive ones, while making a list of positive what if's to help positive redirection.   Participation Level:  Active  Participation Quality:  Appropriate  Affect:  Appropriate  Cognitive:  Appropriate  Insight: Appropriate  Engagement in Group:  Engaged  Modes of Intervention:  Activity  Additional Comments:    Leigh VEAR Pais 06/12/2024, 12:12 PM

## 2024-06-13 DIAGNOSIS — F323 Major depressive disorder, single episode, severe with psychotic features: Secondary | ICD-10-CM | POA: Diagnosis not present

## 2024-06-13 NOTE — Progress Notes (Signed)
 [facility] MD Progress Note  06/13/2024 4:59 PM Richard Moon  MRN:  [id number]  From initial presentation:   Richard Moon is a 63 y.o. year-old male presents to the ED with chief complaint of suicidal and homicidal thoughts.  States that his medications have not been working and he feels increasingly irritable.  States that he is now having thoughts of wanting to hurt himself and hurt others.  States that he has been using cocaine, and then uses alcohol to try and go to sleep.  He denies any recent illnesses.  Denies fever, cough, cold symptoms.  Subjective:  Chart reviewed, case discussed in multidisciplinary meeting, patient seen during rounds.  06/13/24: Patient is noted to be sitting in his room reviewing the list of placements that he needs to call.  Patient reports that his mood is improving on the current medication and denies feeling hopeless or helpless.  He denies SI/HI/plan and denies hallucinations.  Per nursing patient is taking medications with no reported side effects.  06/12/2024: Patient is noted to be resting in bed.  He offers no complaints.  He reports ongoing depression and anxiety due to his homeless situation and not being accepted to inpatient rehab programs.  Patient reports working with [facility] before and is confused about their denial of acceptance.  He rates his depression as 7 out of 10, 10 being the worst and anxiety as 8 out of 10.  He reports having hyperstartle with loud sounds on the unit.  He denies SI/HI/plan and denies hallucination.  He is taking his medications with no reported side effect 12/21: Patient found resting in his room. He reports he is doing alright. He denies SI, HI, and VH. He endorses AH of people crying. He expresses that he has come out of his room for food and groups. Endorses attending to ADLs. He reports that his mind is still racing while he is awake.   Denies concerns/complaints. He continues to remain interested in rehab/housing  resources.   12/20: Patient found resting in his room. He reports his mood as down. He denies SI, HI, and AVH. He reports he is tired. Endorses sleeping adequately and that he sleeps a lot. Endorses eating adequately as well. Patient reports wanting to engage with social work for housing and rehabilitation.   12/19: Patient is found resting in his room. He engages with clinical research associate and is cooperative. He reports that he is doing okay. He is experiencing anxiety at times rating it a 9/10. He rates his depression a 7/10. He endorses using cocaine about three days ago and uses daily. He endorses also using THC occasionally.   Denies SI, HI, and VH. He initially endorses AH, however it does not appear to be due to primary thought disorder. He described the AH as him having a conversation with myself and that it is just one voice. He endorses also hearing bells and shit sometimes.   Past Psychiatric History: see h&P Family History: History reviewed. No pertinent family history. Social History:  Social History   Substance and Sexual Activity  Alcohol Use Yes   Comment: Occasional alcohol use     Social History   Substance and Sexual Activity  Drug Use Yes   Types: Marijuana, Cocaine   Comment: daily cocaine use (reported $200-300/ daily); occasional cannabis use    Social History   Socioeconomic History   Marital status: Single    Spouse name: Not on file   Number of children: Not on file   Years  of education: Not on file   Highest education level: Not on file  Occupational History   Not on file  Tobacco Use   Smoking status: Some Days    Current packs/day: 0.25    Average packs/day: 0.3 packs/day for 20.0 years (5.0 ttl pk-yrs)    Types: Cigarettes   Smokeless tobacco: Never  Vaping Use   Vaping status: Former  Substance and Sexual Activity   Alcohol use: Yes    Comment: Occasional alcohol use   Drug use: Yes    Types: Marijuana, Cocaine    Comment: daily cocaine use  (reported $200-300/ daily); occasional cannabis use   Sexual activity: Yes    Birth control/protection: Condom  Other Topics Concern   Not on file  Social History Narrative   Not on file   Social Drivers of Health   Tobacco Use: High Risk (06/06/2024)   Patient History    Smoking Tobacco Use: Some Days    Smokeless Tobacco Use: Never    Passive Exposure: Not on file  Financial Resource Strain: Not on file  Food Insecurity: Food Insecurity Present (06/06/2024)   Epic    Worried About Programme Researcher, Broadcasting/film/video in the Last Year: Sometimes true    Ran Out of Food in the Last Year: Sometimes true  Transportation Needs: Unmet Transportation Needs (06/06/2024)   Epic    Lack of Transportation (Medical): Yes    Lack of Transportation (Non-Medical): Yes  Physical Activity: Not on file  Stress: Not on file  Social Connections: Not on file  Depression (PHQ2-9): High Risk (06/08/2023)   Depression (PHQ2-9)    PHQ-2 Score: 12  Alcohol Screen: Medium Risk (06/06/2024)   Alcohol Screen    Last Alcohol Screening Score (AUDIT): 14  Housing: High Risk (06/06/2024)   Epic    Unable to Pay for Housing in the Last Year: Yes    Number of Times Moved in the Last Year: 4    Homeless in the Last Year: Yes  Utilities: Not At Risk (06/06/2024)   Epic    Threatened with loss of utilities: No  Health Literacy: Not on file   Past Medical History:  Past Medical History:  Diagnosis Date   Cocaine abuse (HCC)    Homeless    Hypothyroidism 03/04/2024   Hypovitaminosis D 03/04/2024   Major depressive disorder, recurrent severe without psychotic features (HCC)    Prediabetes 03/04/2024   Substance induced mood disorder (HCC)     Past Surgical History:  Procedure Laterality Date   HERNIA REPAIR      Current Medications: Current Facility-Administered Medications  Medication Dose Route Frequency Provider Last Rate Last Admin   acetaminophen  (TYLENOL ) tablet 650 mg  650 mg Oral Q6H PRN Coleman,  Carolyn H, NP   650 mg at 06/12/24 2115   alum & mag hydroxide-simeth (MAALOX/MYLANTA) 200-200-20 MG/5ML suspension 30 mL  30 mL Oral Q4H PRN Coleman, Carolyn H, NP       haloperidol  (HALDOL ) tablet 5 mg  5 mg Oral TID PRN Mardy Elveria DEL, NP       And   diphenhydrAMINE  (BENADRYL ) capsule 50 mg  50 mg Oral TID PRN Mardy Elveria DEL, NP       haloperidol  lactate (HALDOL ) injection 5 mg  5 mg Intramuscular TID PRN Mardy Elveria DEL, NP       And   diphenhydrAMINE  (BENADRYL ) injection 50 mg  50 mg Intramuscular TID PRN Mardy Elveria DEL, NP  And   LORazepam  (ATIVAN ) injection 2 mg  2 mg Intramuscular TID PRN Coleman, Carolyn H, NP       haloperidol  lactate (HALDOL ) injection 10 mg  10 mg Intramuscular TID PRN Mardy Elveria DEL, NP       And   diphenhydrAMINE  (BENADRYL ) injection 50 mg  50 mg Intramuscular TID PRN Mardy Elveria DEL, NP       And   LORazepam  (ATIVAN ) injection 2 mg  2 mg Intramuscular TID PRN Mardy Elveria DEL, NP       feeding supplement (ENSURE PLUS HIGH PROTEIN) liquid 237 mL  237 mL Oral TID BM Dex Blakely, MD   237 mL at 06/12/24 2000   hydrOXYzine  (ATARAX ) tablet 50 mg  50 mg Oral Q6H PRN Coleman, Carolyn H, NP   50 mg at 06/12/24 2116   levothyroxine  (SYNTHROID ) tablet 25 mcg  25 mcg Oral Q0600 Coleman, Carolyn H, NP   25 mcg at 06/13/24 0605   magnesium  hydroxide (MILK OF MAGNESIA) suspension 30 mL  30 mL Oral Daily PRN Coleman, Carolyn H, NP       multivitamin with minerals tablet 1 tablet  1 tablet Oral Daily Savanah Bayles, MD   1 tablet at 06/13/24 9195   nicotine  polacrilex (NICORETTE ) gum 2 mg  2 mg Oral PRN Janmarie Smoot, MD   2 mg at 06/07/24 9171   sertraline  (ZOLOFT ) tablet 100 mg  100 mg Oral Daily May, Tanya, NP   100 mg at 06/13/24 9195   traZODone  (DESYREL ) tablet 25 mg  25 mg Oral QHS PRN Hunter, Crystal L, PA-C   25 mg at 06/12/24 2116    Lab Results:  No results found for this or any previous visit (from the past 48  hours).   Blood Alcohol level:  Lab Results  Component Value Date   ETH <15 06/05/2024   ETH 43 (H) 02/29/2024    Metabolic Disorder Labs: Lab Results  Component Value Date   HGBA1C 5.6 06/09/2024   MPG 114.02 06/09/2024   MPG 122.63 03/02/2024   No results found for: PROLACTIN Lab Results  Component Value Date   CHOL 178 06/09/2024   TRIG 92 06/09/2024   HDL 81 06/09/2024   CHOLHDL 2.2 06/09/2024   VLDL 18 06/09/2024   LDLCALC 78 06/09/2024   LDLCALC 100 (H) 03/02/2024    Physical Findings: AIMS:  , ,  ,  ,    CIWA:  CIWA-Ar Total: 0 COWS:      Psychiatric Specialty Exam:  Presentation  General Appearance:  Appropriate for Environment; Casual  Eye Contact: Fair  Speech: Clear and Coherent; Normal Rate  Speech Volume: Normal    Mood and Affect  Mood: Euthymic  Affect: Appropriate   Thought Process  Thought Processes: Coherent; Linear  Orientation:Full (Time, Place and Person)  Thought Content:Logical  Hallucinations: Denies Ideas of Reference:None  Suicidal Thoughts: Denies Homicidal Thoughts: Denies    Sensorium  Memory: Immediate Fair; Recent Fair  Judgment: Fair  Insight: Fair   Art Therapist  Concentration: Fair  Attention Span: Fair  Recall: [employer] of Knowledge: Fair  Language: Fair   Psychomotor Activity  Psychomotor Activity: No data recorded   Musculoskeletal: Strength & Muscle Tone: within normal limits Gait & Station: normal Assets  Assets: Manufacturing Systems Engineer; Desire for Improvement    Physical Exam: Physical Exam Vitals and nursing note reviewed.  Constitutional:      Appearance: Normal appearance.  Pulmonary:     Effort: Pulmonary  effort is normal.  Neurological:     Mental Status: He is alert and oriented to person, place, and time.  Psychiatric:        Mood and Affect: Mood normal.        Behavior: Behavior normal.    Review of Systems  Respiratory:   Negative for shortness of breath.   Cardiovascular:  Negative for chest pain.  Gastrointestinal:  Negative for diarrhea, nausea and vomiting.  Psychiatric/Behavioral:  Positive for hallucinations and substance abuse. Negative for depression and suicidal ideas. The patient is nervous/anxious.   All other systems reviewed and are negative.  Blood pressure 112/82, pulse 65, temperature 98.4 F (36.9 C), temperature source Oral, resp. rate 16, height 5' 9 (1.753 m), weight 64.4 kg, SpO2 99%. Body mass index is 20.97 kg/m.  Diagnosis: Active Problems:   MDD (major depressive disorder), recurrent, severe, with psychosis (HCC)   Treatment Plan Summary:   Safety and Monitoring:             -- Voluntary admission to inpatient psychiatric unit for safety, stabilization and treatment             -- Daily contact with patient to assess and evaluate symptoms and progress in treatment             -- Patient's case to be discussed in multi-disciplinary team meeting             -- Observation Level: q15 minute checks             -- Vital signs:  q12 hours             -- Precautions: suicide, elopement, and assault   2. Psychiatric Diagnoses and Treatment:   - Zoloft  100 mg once daily          - Trazodone  25 mg at bedtime as needed for sleep - dose decreased per patient request due to feeling 50 mg was too much - CIWA protocol - As needed Librium      -- The risks/benefits/side-effects/alternatives to this medication were discussed in detail with the patient and time was given for questions. The patient consents to medication trial.                -- Metabolic profile and EKG monitoring obtained while on an atypical antipsychotic (BMI: Lipid Panel: HbgA1c: QTc:)              -- Encouraged patient to participate in unit milieu and in scheduled group therapies                            3. Medical Issues Being Addressed:  No acute concerns.    4. Discharge Planning:   -- Social work and  case management to assist with discharge planning and identification of hospital follow-up needs prior to discharge  -- Estimated LOS: 5-7 days  Allyn Foil, MD 06/13/2024, 4:59 PM

## 2024-06-13 NOTE — Progress Notes (Signed)
" °   06/13/24 1800  Psych Admission Type (Psych Patients Only)  Admission Status Voluntary  Psychosocial Assessment  Patient Complaints Anxiety  Eye Contact Fair  Facial Expression Other (Comment)  Affect Appropriate to circumstance  Speech Logical/coherent  Interaction Assertive  Motor Activity Slow  Appearance/Hygiene Unremarkable  Behavior Characteristics Appropriate to situation  Mood Anxious;Pleasant  Thought Process  Coherency WDL  Content WDL  Delusions None reported or observed  Perception WDL  Hallucination None reported or observed  Judgment Limited  Confusion None  Danger to Self  Current suicidal ideation? Denies  Agreement Not to Harm Self Yes  Description of Agreement verbal  Danger to Others  Danger to Others None reported or observed    "

## 2024-06-13 NOTE — Plan of Care (Signed)
°  Problem: Education: Goal: Knowledge of the prescribed therapeutic regimen will improve Outcome: Progressing   Problem: Activity: Goal: Interest or engagement in leisure activities will improve Outcome: Progressing Goal: Imbalance in normal sleep/wake cycle will improve Outcome: Progressing   Problem: Coping: Goal: Coping ability will improve Outcome: Progressing Goal: Will verbalize feelings Outcome: Progressing   

## 2024-06-13 NOTE — Group Note (Signed)
 LCSW Group Therapy Note  Group Date: 06/13/2024 Start Time: 1300 End Time: 1400   Type of Therapy and Topic:  Group Therapy: Anger Cues and Responses  Participation Level:  None   Description of Group:   In this group, patients learned how to recognize the physical, cognitive, emotional, and behavioral responses they have to anger-provoking situations.  They identified a recent time they became angry and how they reacted.  They analyzed how their reaction was possibly beneficial and how it was possibly unhelpful.  The group discussed a variety of healthier coping skills that could help with such a situation in the future.  Focus was placed on how helpful it is to recognize the underlying emotions to our anger, because working on those can lead to a more permanent solution as well as our ability to focus on the important rather than the urgent.  Therapeutic Goals: Patients will remember their last incident of anger and how they felt emotionally and physically, what their thoughts were at the time, and how they behaved. Patients will identify how their behavior at that time worked for them, as well as how it worked against them. Patients will explore possible new behaviors to use in future anger situations. Patients will learn that anger itself is normal and cannot be eliminated, and that healthier reactions can assist with resolving conflict rather than worsening situations.  Summary of Patient Progress:   Patient was present in group. Patient was attentive and engaged, however did not engage in group discussion.   Therapeutic Modalities:   Cognitive Behavioral Therapy    Sherryle JINNY Margo, LCSWA 06/13/2024  3:20 PM

## 2024-06-13 NOTE — Group Note (Signed)
 Recreation Therapy Group Note   Group Topic:Healthy Support Systems  Group Date: 06/13/2024 Start Time: 1010 End Time: 1050 Facilitators: Celestia Jeoffrey BRAVO, LRT, CTRS Location: Craft Room  Group Description: Straw Bridge. In groups or individually, patients were given 10 plastic drinking straws and an equal length of masking tape. Using the materials provided, patients were instructed to build a free-standing bridge-like structure to suspend an everyday item (ex: deck of cards) off the floor or table surface. All materials were required to be used in secondary school teacher. LRT facilitated post-activity discussion reviewing the importance of having strong and healthy support systems in our lives. LRT discussed how the people in our lives serve as the tape and the deck of cards we placed on top of our straw structure are the stressors we face in daily life. LRT and pts discussed what happens in our life when things get too heavy for us , and we don't have strong supports outside of the hospital. Pt shared 2 of their healthy supports in their life aloud in the group.   Goal Area(s) Addressed:  Patient will identify 2 healthy supports in their life. Patient will identify skills to successfully complete activity. Patient will identify correlation of this activity to life post-discharge.  Patient will build on frustration tolerance skills. Patient will increase team building and communication skills.   Affect/Mood: Appropriate   Participation Level: Active and Engaged   Participation Quality: Independent   Behavior: Appropriate, Calm, and Cooperative   Speech/Thought Process: Coherent   Insight: Good and Improved   Judgement: Good   Modes of Intervention: STEM Activity   Patient Response to Interventions:  Attentive, Engaged, Interested , and Receptive   Education Outcome:  Acknowledges education   Clinical Observations/Individualized Feedback: Richard Moon was active in their participation of  session activities and group discussion. Pt identified therapy and listening to music as healthy supports.    Plan: Continue to engage patient in RT group sessions 2-3x/week.   Jeoffrey BRAVO Celestia, LRT, CTRS 06/13/2024 11:03 AM

## 2024-06-13 NOTE — BH IP Treatment Plan (Unsigned)
 Interdisciplinary Treatment and Diagnostic Plan Update  06/13/2024 Time of Session: 8:00AM Richard Moon MRN: [id number]  Principal Diagnosis: <principal problem not specified>  Secondary Diagnoses: Active Problems:   MDD (major depressive disorder), recurrent, severe, with psychosis (HCC)   Current Medications:  Current Facility-Administered Medications  Medication Dose Route Frequency Provider Last Rate Last Admin   acetaminophen  (TYLENOL ) tablet 650 mg  650 mg Oral Q6H PRN Coleman, Carolyn H, NP   650 mg at 06/12/24 2115   alum & mag hydroxide-simeth (MAALOX/MYLANTA) 200-200-20 MG/5ML suspension 30 mL  30 mL Oral Q4H PRN Coleman, Carolyn H, NP       haloperidol  (HALDOL ) tablet 5 mg  5 mg Oral TID PRN Mardy Elveria DEL, NP       And   diphenhydrAMINE  (BENADRYL ) capsule 50 mg  50 mg Oral TID PRN Mardy Elveria DEL, NP       haloperidol  lactate (HALDOL ) injection 5 mg  5 mg Intramuscular TID PRN Mardy Elveria DEL, NP       And   diphenhydrAMINE  (BENADRYL ) injection 50 mg  50 mg Intramuscular TID PRN Mardy Elveria DEL, NP       And   LORazepam  (ATIVAN ) injection 2 mg  2 mg Intramuscular TID PRN Coleman, Carolyn H, NP       haloperidol  lactate (HALDOL ) injection 10 mg  10 mg Intramuscular TID PRN Mardy Elveria DEL, NP       And   diphenhydrAMINE  (BENADRYL ) injection 50 mg  50 mg Intramuscular TID PRN Mardy Elveria DEL, NP       And   LORazepam  (ATIVAN ) injection 2 mg  2 mg Intramuscular TID PRN Coleman, Carolyn H, NP       feeding supplement (ENSURE PLUS HIGH PROTEIN) liquid 237 mL  237 mL Oral TID BM Jadapalle, Sree, MD   237 mL at 06/12/24 2000   hydrOXYzine  (ATARAX ) tablet 50 mg  50 mg Oral Q6H PRN Coleman, Carolyn H, NP   50 mg at 06/12/24 2116   levothyroxine  (SYNTHROID ) tablet 25 mcg  25 mcg Oral Q0600 Coleman, Carolyn H, NP   25 mcg at 06/13/24 0605   magnesium  hydroxide (MILK OF MAGNESIA) suspension 30 mL  30 mL Oral Daily PRN Coleman, Carolyn H, NP       multivitamin  with minerals tablet 1 tablet  1 tablet Oral Daily Jadapalle, Sree, MD   1 tablet at 06/13/24 9195   nicotine  polacrilex (NICORETTE ) gum 2 mg  2 mg Oral PRN Jadapalle, Sree, MD   2 mg at 06/07/24 9171   sertraline  (ZOLOFT ) tablet 100 mg  100 mg Oral Daily May, Tanya, NP   100 mg at 06/13/24 9195   traZODone  (DESYREL ) tablet 25 mg  25 mg Oral QHS PRN Hunter, Crystal L, PA-C   25 mg at 06/12/24 2116   PTA Medications: Medications Prior to Admission  Medication Sig Dispense Refill Last Dose/Taking   sertraline  (ZOLOFT ) 50 MG tablet Take 1 tablet (50 mg total) by mouth daily. 30 tablet 0 06/06/2024   levothyroxine  (SYNTHROID ) 25 MCG tablet Take 1 tablet (25 mcg total) by mouth daily at 6 (six) AM. (Patient not taking: Reported on 06/06/2024) 30 tablet 0    traZODone  (DESYREL ) 50 MG tablet Take 1 tablet (50 mg total) by mouth at bedtime. (Patient not taking: Reported on 06/06/2024) 30 tablet 0     Patient Stressors: Financial difficulties   Loss of job last Friday   Occupational concerns   Substance abuse  Patient Strengths: Ability for insight  Physical Health   Treatment Modalities: Medication Management, Group therapy, Case management,  1 to 1 session with clinician, Psychoeducation, Recreational therapy.   Physician Treatment Plan for Primary Diagnosis: <principal problem not specified> Long Term Goal(s): Improvement in symptoms so as ready for discharge   Short Term Goals: Ability to identify changes in lifestyle to reduce recurrence of condition will improve Ability to disclose and discuss suicidal ideas Ability to demonstrate self-control will improve Ability to identify triggers associated with substance abuse/mental health issues will improve  Medication Management: Evaluate patient's response, side effects, and tolerance of medication regimen.  Therapeutic Interventions: 1 to 1 sessions, Unit Group sessions and Medication administration.  Evaluation of Outcomes: Not  Progressing  Physician Treatment Plan for Secondary Diagnosis: Active Problems:   MDD (major depressive disorder), recurrent, severe, with psychosis (HCC)  Long Term Goal(s): Improvement in symptoms so as ready for discharge   Short Term Goals: Ability to identify changes in lifestyle to reduce recurrence of condition will improve Ability to disclose and discuss suicidal ideas Ability to demonstrate self-control will improve Ability to identify triggers associated with substance abuse/mental health issues will improve     Medication Management: Evaluate patient's response, side effects, and tolerance of medication regimen.  Therapeutic Interventions: 1 to 1 sessions, Unit Group sessions and Medication administration.  Evaluation of Outcomes: Not Progressing   RN Treatment Plan for Primary Diagnosis: <principal problem not specified> Long Term Goal(s): Knowledge of disease and therapeutic regimen to maintain health will improve  Short Term Goals: Ability to verbalize frustration and anger appropriately will improve, Ability to demonstrate self-control, Ability to participate in decision making will improve, Ability to verbalize feelings will improve, Ability to disclose and discuss suicidal ideas, Ability to identify and develop effective coping behaviors will improve, and Compliance with prescribed medications will improve  Medication Management: RN will administer medications as ordered by provider, will assess and evaluate patient's response and provide education to patient for prescribed medication. RN will report any adverse and/or side effects to prescribing provider.  Therapeutic Interventions: 1 on 1 counseling sessions, Psychoeducation, Medication administration, Evaluate responses to treatment, Monitor vital signs and CBGs as ordered, Perform/monitor CIWA, COWS, AIMS and Fall Risk screenings as ordered, Perform wound care treatments as ordered.  Evaluation of Outcomes:  Progressing   LCSW Treatment Plan for Primary Diagnosis: <principal problem not specified> Long Term Goal(s): Safe transition to appropriate next level of care at discharge, Engage patient in therapeutic group addressing interpersonal concerns.  Short Term Goals: Engage patient in aftercare planning with referrals and resources, Increase social support, Increase ability to appropriately verbalize feelings, Increase emotional regulation, Facilitate acceptance of mental health diagnosis and concerns, Facilitate patient progression through stages of change regarding substance use diagnoses and concerns, Identify triggers associated with mental health/substance abuse issues, and Increase skills for wellness and recovery  Therapeutic Interventions: Assess for all discharge needs, 1 to 1 time with Social worker, Explore available resources and support systems, Assess for adequacy in community support network, Educate family and significant other(s) on suicide prevention, Complete Psychosocial Assessment, Interpersonal group therapy.  Evaluation of Outcomes: Progressing   Progress in Treatment: Attending groups: Yes. Participating in groups: Yes. Taking medication as prescribed: Yes. Toleration medication: Yes. Family/Significant other contact made: No, will contact:  once permission has been granted.  Patient understands diagnosis: Yes. Discussing patient identified problems/goals with staff: Yes. Medical problems stabilized or resolved: Yes. Denies suicidal/homicidal ideation: Yes. Issues/concerns per patient self-inventory: No. Other:  none  New problem(s) identified: No, Describe:  none identified.  Update 06/13/2024:  No changes at this time.    New Short Term/Long Term Goal(s): medication management for mood stabilization; elimination of SI thoughts; development of comprehensive mental wellness/sobriety plan. Update 06/13/2024:  No changes at this time.    Patient Goals: I have these  racing thoughts, not wanting to live thoughts, and really want to get into treatment for substance use.   Update 06/13/2024:  No changes at this time.     Discharge Plan or Barriers: CSW will assist pt with development of an appropriate aftercare/discharge plan.  Update 06/13/2024:  CSW team and patient continue to work to identify a substance abuse facility patient would like to be referred to.    Reason for Continuation of Hospitalization: Depression Medication stabilization Suicidal ideation   Estimated Length of Stay: 1-7 days Update 06/13/2024:  No changes at this time.   Last 3 [place] Suicide Severity Risk Score: Flowsheet Row Admission (Current) from 06/06/2024 in [facility] ED from 06/05/2024 in [facility] Department at [facility]Dis[facility]  C-SSRS RISK CATEGORY High Risk High Risk High Risk    Last PHQ 2/9 Scores:    06/08/2023    8:00 AM 06/07/2023    2:09 PM 06/05/2023    4:55 AM  Depression screen PHQ 2/9  Decreased Interest 2 2 2   Down, Depressed, Hopeless 2 2 2   PHQ - 2 Score 4 4 4   Altered sleeping 1 1 1   Tired, decreased energy 1 1 1   Change in appetite 1 1 1   Feeling bad or failure about yourself  3 3 3   Trouble concentrating 1 1 1   Moving slowly or fidgety/restless 0 0 0  Suicidal thoughts 1 1 1   PHQ-9 Score 12  12  12    Difficult doing work/chores   Very difficult     Data saved with a previous flowsheet row definition    Scribe for Treatment Team: Sherryle JINNY Margo, KEN 06/13/2024 8:19 AM

## 2024-06-13 NOTE — Progress Notes (Signed)
 Pt denies SI/HI/AVH this shift. He reports anxiety and trouble sleeping. PRN Atarax  25mg  and Trazodone  50mg  given which was effective. Q 15 min safety checks in place.    06/12/24 2115  Psych Admission Type (Psych Patients Only)  Admission Status Voluntary  Psychosocial Assessment  Patient Complaints Anxiety  Eye Contact Fair  Facial Expression Other (Comment) (wnl)  Affect Appropriate to circumstance  Speech Logical/coherent  Interaction Assertive  Motor Activity Slow  Appearance/Hygiene Unremarkable  Behavior Characteristics Appropriate to situation  Mood Anxious;Pleasant  Thought Process  Coherency WDL  Content WDL  Delusions None reported or observed  Perception WDL  Hallucination None reported or observed  Judgment Limited  Confusion None  Danger to Self  Current suicidal ideation? Denies  Agreement Not to Harm Self Yes  Description of Agreement Verbal  Danger to Others  Danger to Others None reported or observed

## 2024-06-13 NOTE — Plan of Care (Signed)

## 2024-06-13 NOTE — Group Note (Signed)
 Recreation Therapy Group Note   Group Topic:Stress Management  Group Date: 06/13/2024 Start Time: 1525 End Time: 1600 Facilitators: Celestia Jeoffrey BRAVO, LRT, CTRS Location: Dayroom  Group Description: PMR (Progressive Muscle Relaxation). LRT educates patients on what PMR is and the benefits that come from it. Patients are asked to sit with their feet flat on the floor while sitting up and all the way back in their chair, if possible. LRT and pts follow a prompt through a speaker that requires you to tense and release different muscles in their body and focus on their breathing. During session, lights are off and soft music is being played. Pts are given a stress ball to use if needed.   Goal Area(s) Addressed:  Patients will be able to describe progressive muscle relaxation.  Patient will practice using relaxation technique. Patient will identify a new coping skill.  Patient will follow multistep directions to reduce anxiety and stress.   Affect/Mood: Appropriate   Participation Level: Active and Engaged   Participation Quality: Independent   Behavior: Calm and Cooperative   Speech/Thought Process: Coherent   Insight: Fair   Judgement: Fair    Modes of Intervention: Clarification, Education, and Exploration   Patient Response to Interventions:  Attentive and Receptive   Education Outcome:  Acknowledges education   Clinical Observations/Individualized Feedback: Richard Moon was active in their participation of session activities and group discussion. Pt completed all of the exercises as prompted.    Plan: Continue to engage patient in RT group sessions 2-3x/week.   Jeoffrey BRAVO Celestia, LRT, CTRS 06/13/2024 4:45 PM

## 2024-06-13 NOTE — Group Note (Signed)
 Date:  06/13/2024 Time:  9:36 PM  Group Topic/Focus:  Wrap-Up Group:   The focus of this group is to help patients review their daily goal of treatment and discuss progress on daily workbooks.    Participation Level:  Active  Participation Quality:  Appropriate  Affect:  Appropriate  Cognitive:  Appropriate  Insight: Appropriate  Engagement in Group:  Engaged  Modes of Intervention:  Discussion  Additional Comments:    Leigh VEAR Pais 06/13/2024, 9:36 PM

## 2024-06-14 DIAGNOSIS — F323 Major depressive disorder, single episode, severe with psychotic features: Secondary | ICD-10-CM | POA: Diagnosis not present

## 2024-06-14 NOTE — Plan of Care (Signed)
  Problem: Education: Goal: Utilization of techniques to improve thought processes will improve Outcome: Progressing Goal: Knowledge of the prescribed therapeutic regimen will improve Outcome: Progressing   Problem: Activity: Goal: Interest or engagement in leisure activities will improve Outcome: Progressing   Problem: Coping: Goal: Coping ability will improve Outcome: Progressing Goal: Will verbalize feelings Outcome: Progressing

## 2024-06-14 NOTE — Plan of Care (Signed)

## 2024-06-14 NOTE — Progress Notes (Signed)
" °   06/14/24 0424  Psych Admission Type (Psych Patients Only)  Admission Status Voluntary  Psychosocial Assessment  Patient Complaints None  Eye Contact Fair  Facial Expression Animated  Affect Appropriate to circumstance  Speech Logical/coherent  Interaction Assertive  Motor Activity Slow  Appearance/Hygiene Unremarkable  Behavior Characteristics Cooperative  Mood Pleasant  Aggressive Behavior  Targets Self  Effect No apparent injury  Thought Process  Coherency WDL  Content WDL  Delusions None reported or observed  Perception WDL  Hallucination None reported or observed  Judgment Limited  Confusion None  Danger to Self  Current suicidal ideation? Denies  Agreement Not to Harm Self Yes  Description of Agreement Verbal  Danger to Others  Danger to Others None reported or observed    "

## 2024-06-14 NOTE — Group Note (Signed)
 Date:  06/14/2024 Time:  10:36 AM  Group Topic/Focus:  Goals Group:   The focus of this group is to help patients establish daily goals to achieve during treatment and discuss how the patient can incorporate goal setting into their daily lives to aide in recovery.    Participation Level:  Active  Participation Quality:  Appropriate  Affect:  Appropriate  Cognitive:  Appropriate  Insight: Appropriate  Engagement in Group:  Engaged  Modes of Intervention:  Discussion, Education, and Support  Additional Comments:    Deitra Caron Mainland 06/14/2024, 10:36 AM

## 2024-06-14 NOTE — Progress Notes (Signed)
 Pt denies SI/HI/AVH this shift. He endorses depression. Seen in his bedroom majority of the shift but came out for meds. He voiced no other complaints. No PRNs administered. Q 15 min safety checks in place.    06/14/24 0832  Psych Admission Type (Psych Patients Only)  Admission Status Voluntary  Psychosocial Assessment  Patient Complaints Depression  Eye Contact Fair  Facial Expression Flat  Affect Appropriate to circumstance  Speech Logical/coherent  Interaction Assertive  Motor Activity Slow  Appearance/Hygiene Unremarkable  Behavior Characteristics Appropriate to situation  Mood Depressed  Thought Process  Coherency WDL  Content WDL  Delusions None reported or observed  Perception WDL  Hallucination None reported or observed  Judgment Limited  Confusion None  Danger to Self  Current suicidal ideation? Denies  Agreement Not to Harm Self Yes  Description of Agreement Verbal  Danger to Others  Danger to Others None reported or observed

## 2024-06-14 NOTE — Group Note (Signed)
 Recreation Therapy Group Note   Group Topic:Other  Group Date: 06/14/2024 Start Time: 1010 End Time: 1110 Facilitators: Celestia Jeoffrey BRAVO, LRT, CTRS Location: Dayroom  Group Description: Patients were given ornaments to either paint or color. Patients were encouraged to make more than one and take it home with them when discharged. LRT and patients discussed how creative arts can be a way to express emotions and use as a coping skill.   Goal Area(s) Addressed:  Patient will identify a new coping skill. Patient will engage in an expressive art. Patient will communicate with LRT and peers.    Affect/Mood: Appropriate   Participation Level: Active and Engaged   Participation Quality: Independent   Behavior: Calm and Cooperative   Speech/Thought Process: Coherent   Insight: Fair   Judgement: Fair    Modes of Intervention: Art and Exploration   Patient Response to Interventions:  Receptive   Education Outcome:  Acknowledges education   Clinical Observations/Individualized Feedback: Ammaar was active in their participation of session activities and group discussion. Pt interacted well with LRT and peers duration of session.    Plan: Continue to engage patient in RT group sessions 2-3x/week.   Jeoffrey BRAVO Celestia, LRT, CTRS 06/14/2024 11:59 AM

## 2024-06-14 NOTE — BHH Counselor (Signed)
 CSW contact the following facilities to check on bed availability and referral process: [facility], [facility],  and [facility]. HIPAA compliant voicemails were left with contact information to follow through.   CSW attempted contact with [facility] to check on the status of referral. Contact was unable to be established but HIPAA compliant voicemail left with contact information for follow through.   Pt was given application to complete for [facility].   Referral information was sent to [facility].   Richard Moon. Chaim, MSW, LCSW, LCAS 06/14/2024 4:38 PM

## 2024-06-14 NOTE — Group Note (Signed)
 Date:  06/14/2024 Time:  9:11 PM  Group Topic/Focus:  Identifying Needs:   The focus of this group is to help patients identify their personal needs that have been historically problematic and identify healthy behaviors to address their needs. Managing Feelings:   The focus of this group is to identify what feelings patients have difficulty handling and develop a plan to handle them in a healthier way upon discharge.    Participation Level:  Active  Participation Quality:  Appropriate  Affect:  Appropriate  Cognitive:  Appropriate  Insight: Appropriate  Engagement in Group:  Engaged  Modes of Intervention:  Education  Additional Comments:    Richard Moon 06/14/2024, 9:11 PM

## 2024-06-14 NOTE — Group Note (Signed)
 [facility] LCSW Group Therapy Note   Group Date: 06/14/2024 Start Time: 1230 End Time: 1430   Type of Therapy/Topic:  Group Therapy:  Balance in Life  Participation Level:  Active   Description of Group:    This group will address the concept of balance and how it feels and looks when one is unbalanced. Patients will be encouraged to process areas in their lives that are out of balance, and identify reasons for remaining unbalanced. Facilitators will guide patients utilizing problem- solving interventions to address and correct the stressor making their life unbalanced. Understanding and applying boundaries will be explored and addressed for obtaining  and maintaining a balanced life. Patients will be encouraged to explore ways to assertively make their unbalanced needs known to significant others in their lives, using other group members and facilitator for support and feedback.  Therapeutic Goals: Patient will identify two or more emotions or situations they have that consume much of in their lives. Patient will identify signs/triggers that life has become out of balance:  Patient will identify two ways to set boundaries in order to achieve balance in their lives:  Patient will demonstrate ability to communicate their needs through discussion and/or role plays  Summary of Patient Progress:    During group, the patient explored how understanding their methods of giving and receiving love can influence social relationships utilizing an emotionally focused therapeutic Lense. Group members discussed times when they felt disconnected and identified strategies to communicate their emotional needs more effectively, fostering stronger interpersonal connections. Together, the patient and peers engaged in brainstorming ways to balance meeting the needs of others while maintaining personal boundaries and self-care. The patient was open to feedback, actively participated in discussions, and contributed  meaningfully to the overall group dynamic.     Therapeutic Modalities:   Cognitive Behavioral Therapy Solution-Focused Therapy Assertiveness Training   Alveta CHRISTELLA Kerns, LCSW

## 2024-06-14 NOTE — Progress Notes (Signed)
 [facility] MD Progress Note  06/14/2024 12:03 PM Richard Moon  MRN:  [id number]  From initial presentation:   Richard Moon is a 63 y.o. year-old male presents to the ED with chief complaint of suicidal and homicidal thoughts.  States that his medications have not been working and Richard Moon feels increasingly irritable.  States that Richard Moon is now having thoughts of wanting to hurt himself and hurt others.  States that Richard Moon has been using cocaine, and then uses alcohol to try and go to sleep.  Richard Moon denies any recent illnesses.  Denies fever, cough, cold symptoms.  Subjective:  Chart reviewed, case discussed in multidisciplinary meeting, patient seen during rounds.   06/14/24: Patient is noted to be resting in bed.  Richard Moon reports improvement in his depression and anxiety.  Richard Moon remains frustrated about unable to find a place to transition out after discharge.  Richard Moon informed the provider that Richard Moon is confused about the reason why Richard Moon got declined from [facility].  Richard Moon denies SI/HI/plan and denies hallucinations.  Richard Moon is not displaying any withdrawal symptoms from alcohol or drugs.  Richard Moon has fair appetite and sleep and participating   06/13/24: Patient is noted to be sitting in his room reviewing the list of placements that Richard Moon needs to call.  Patient reports that his mood is improving on the current medication and denies feeling hopeless or helpless.  Richard Moon denies SI/HI/plan and denies hallucinations.  Per nursing patient is taking medications with no reported side effects.  06/12/2024: Patient is noted to be resting in bed.  Richard Moon offers no complaints.  Richard Moon reports ongoing depression and anxiety due to his homeless situation and not being accepted to inpatient rehab programs.  Patient reports working with [facility] before and is confused about their denial of acceptance.  Richard Moon rates his depression as 7 out of 10, 10 being the worst and anxiety as 8 out of 10.  Richard Moon reports having hyperstartle with loud sounds on the unit.  Richard Moon denies SI/HI/plan and  denies hallucination.  Richard Moon is taking his medications with no reported side effect 12/21: Patient found resting in his room. Richard Moon reports Richard Moon is doing alright. Richard Moon denies SI, HI, and VH. Richard Moon endorses AH of people crying. Richard Moon expresses that Richard Moon has come out of his room for food and groups. Endorses attending to ADLs. Richard Moon reports that his mind is still racing while Richard Moon is awake.   Denies concerns/complaints. Richard Moon continues to remain interested in rehab/housing resources.   12/20: Patient found resting in his room. Richard Moon reports his mood as down. Richard Moon denies SI, HI, and AVH. Richard Moon reports Richard Moon is tired. Endorses sleeping adequately and that Richard Moon sleeps a lot. Endorses eating adequately as well. Patient reports wanting to engage with social work for housing and rehabilitation.   12/19: Patient is found resting in his room. Richard Moon engages with clinical research associate and is cooperative. Richard Moon reports that Richard Moon is doing okay. Richard Moon is experiencing anxiety at times rating it a 9/10. Richard Moon rates his depression a 7/10. Richard Moon endorses using cocaine about three days ago and uses daily. Richard Moon endorses also using THC occasionally.   Denies SI, HI, and VH. Richard Moon initially endorses AH, however it does not appear to be due to primary thought disorder. Richard Moon described the AH as him having a conversation with myself and that it is just one voice. Richard Moon endorses also hearing bells and shit sometimes.   Past Psychiatric History: see h&P Family History: History reviewed. No pertinent family history. Social History:  Social History  Substance and Sexual Activity  Alcohol Use Yes   Comment: Occasional alcohol use     Social History   Substance and Sexual Activity  Drug Use Yes   Types: Marijuana, Cocaine   Comment: daily cocaine use (reported $200-300/ daily); occasional cannabis use    Social History   Socioeconomic History   Marital status: Single    Spouse name: Not on file   Number of children: Not on file   Years of education: Not on file   Highest  education level: Not on file  Occupational History   Not on file  Tobacco Use   Smoking status: Some Days    Current packs/day: 0.25    Average packs/day: 0.3 packs/day for 20.0 years (5.0 ttl pk-yrs)    Types: Cigarettes   Smokeless tobacco: Never  Vaping Use   Vaping status: Former  Substance and Sexual Activity   Alcohol use: Yes    Comment: Occasional alcohol use   Drug use: Yes    Types: Marijuana, Cocaine    Comment: daily cocaine use (reported $200-300/ daily); occasional cannabis use   Sexual activity: Yes    Birth control/protection: Condom  Other Topics Concern   Not on file  Social History Narrative   Not on file   Social Drivers of Health   Tobacco Use: High Risk (06/06/2024)   Patient History    Smoking Tobacco Use: Some Days    Smokeless Tobacco Use: Never    Passive Exposure: Not on file  Financial Resource Strain: Not on file  Food Insecurity: Food Insecurity Present (06/06/2024)   Epic    Worried About Programme Researcher, Broadcasting/film/video in the Last Year: Sometimes true    Ran Out of Food in the Last Year: Sometimes true  Transportation Needs: Unmet Transportation Needs (06/06/2024)   Epic    Lack of Transportation (Medical): Yes    Lack of Transportation (Non-Medical): Yes  Physical Activity: Not on file  Stress: Not on file  Social Connections: Not on file  Depression (PHQ2-9): High Risk (06/08/2023)   Depression (PHQ2-9)    PHQ-2 Score: 12  Alcohol Screen: Medium Risk (06/06/2024)   Alcohol Screen    Last Alcohol Screening Score (AUDIT): 14  Housing: High Risk (06/06/2024)   Epic    Unable to Pay for Housing in the Last Year: Yes    Number of Times Moved in the Last Year: 4    Homeless in the Last Year: Yes  Utilities: Not At Risk (06/06/2024)   Epic    Threatened with loss of utilities: No  Health Literacy: Not on file   Past Medical History:  Past Medical History:  Diagnosis Date   Cocaine abuse (HCC)    Homeless    Hypothyroidism 03/04/2024    Hypovitaminosis D 03/04/2024   Major depressive disorder, recurrent severe without psychotic features (HCC)    Prediabetes 03/04/2024   Substance induced mood disorder (HCC)     Past Surgical History:  Procedure Laterality Date   HERNIA REPAIR      Current Medications: Current Facility-Administered Medications  Medication Dose Route Frequency Provider Last Rate Last Admin   acetaminophen  (TYLENOL ) tablet 650 mg  650 mg Oral Q6H PRN Coleman, Carolyn H, NP   650 mg at 06/12/24 2115   alum & mag hydroxide-simeth (MAALOX/MYLANTA) 200-200-20 MG/5ML suspension 30 mL  30 mL Oral Q4H PRN Coleman, Carolyn H, NP       haloperidol  (HALDOL ) tablet 5 mg  5 mg Oral TID PRN  Mardy Elveria DEL, NP       And   diphenhydrAMINE  (BENADRYL ) capsule 50 mg  50 mg Oral TID PRN Coleman, Carolyn H, NP       haloperidol  lactate (HALDOL ) injection 5 mg  5 mg Intramuscular TID PRN Mardy Elveria DEL, NP       And   diphenhydrAMINE  (BENADRYL ) injection 50 mg  50 mg Intramuscular TID PRN Mardy Elveria DEL, NP       And   LORazepam  (ATIVAN ) injection 2 mg  2 mg Intramuscular TID PRN Coleman, Carolyn H, NP       haloperidol  lactate (HALDOL ) injection 10 mg  10 mg Intramuscular TID PRN Mardy Elveria DEL, NP       And   diphenhydrAMINE  (BENADRYL ) injection 50 mg  50 mg Intramuscular TID PRN Mardy Elveria DEL, NP       And   LORazepam  (ATIVAN ) injection 2 mg  2 mg Intramuscular TID PRN Coleman, Carolyn H, NP       feeding supplement (ENSURE PLUS HIGH PROTEIN) liquid 237 mL  237 mL Oral TID BM Shacara Cozine, MD   237 mL at 06/14/24 0902   hydrOXYzine  (ATARAX ) tablet 50 mg  50 mg Oral Q6H PRN Coleman, Carolyn H, NP   50 mg at 06/12/24 2116   levothyroxine  (SYNTHROID ) tablet 25 mcg  25 mcg Oral Q0600 Coleman, Carolyn H, NP   25 mcg at 06/14/24 9342   magnesium  hydroxide (MILK OF MAGNESIA) suspension 30 mL  30 mL Oral Daily PRN Coleman, Carolyn H, NP       multivitamin with minerals tablet 1 tablet  1 tablet Oral Daily  Ellowyn Rieves, MD   1 tablet at 06/14/24 9167   nicotine  polacrilex (NICORETTE ) gum 2 mg  2 mg Oral PRN Pamela Maddy, MD   2 mg at 06/07/24 9171   sertraline  (ZOLOFT ) tablet 100 mg  100 mg Oral Daily May, Tanya, NP   100 mg at 06/14/24 9167   traZODone  (DESYREL ) tablet 25 mg  25 mg Oral QHS PRN Hunter, Crystal L, PA-C   25 mg at 06/13/24 2043    Lab Results:  No results found for this or any previous visit (from the past 48 hours).   Blood Alcohol level:  Lab Results  Component Value Date   ETH <15 06/05/2024   ETH 43 (H) 02/29/2024    Metabolic Disorder Labs: Lab Results  Component Value Date   HGBA1C 5.6 06/09/2024   MPG 114.02 06/09/2024   MPG 122.63 03/02/2024   No results found for: PROLACTIN Lab Results  Component Value Date   CHOL 178 06/09/2024   TRIG 92 06/09/2024   HDL 81 06/09/2024   CHOLHDL 2.2 06/09/2024   VLDL 18 06/09/2024   LDLCALC 78 06/09/2024   LDLCALC 100 (H) 03/02/2024    Physical Findings: AIMS:  , ,  ,  ,    CIWA:  CIWA-Ar Total: 0 COWS:      Psychiatric Specialty Exam:  Presentation  General Appearance:  Appropriate for Environment; Casual  Eye Contact: Fair  Speech: Clear and Coherent; Normal Rate  Speech Volume: Normal    Mood and Affect  Mood: Euthymic  Affect: Appropriate   Thought Process  Thought Processes: Coherent; Linear  Orientation:Full (Time, Place and Person)  Thought Content:Logical  Hallucinations: Denies Ideas of Reference:None  Suicidal Thoughts: Denies Homicidal Thoughts: Denies    Sensorium  Memory: Immediate Fair; Recent Fair  Judgment: Fair  Insight: Fair   Art Therapist  Concentration: Fair  Attention Span: Fair  Recall: [employer] of Knowledge: Fair  Language: Fair   Psychomotor Activity  Psychomotor Activity: No data recorded   Musculoskeletal: Strength & Muscle Tone: within normal limits Gait & Station: normal Assets   Assets: Manufacturing Systems Engineer; Desire for Improvement    Physical Exam: Physical Exam Vitals and nursing note reviewed.  Constitutional:      Appearance: Normal appearance.  Pulmonary:     Effort: Pulmonary effort is normal.  Neurological:     Mental Status: Richard Moon is alert and oriented to person, place, and time.  Psychiatric:        Mood and Affect: Mood normal.        Behavior: Behavior normal.    Review of Systems  Respiratory:  Negative for shortness of breath.   Cardiovascular:  Negative for chest pain.  Gastrointestinal:  Negative for diarrhea, nausea and vomiting.  Psychiatric/Behavioral:  Positive for hallucinations and substance abuse. Negative for depression and suicidal ideas. The patient is nervous/anxious.   All other systems reviewed and are negative.  Blood pressure 106/74, pulse 73, temperature 97.8 F (36.6 C), temperature source Oral, resp. rate 16, height 5' 9 (1.753 m), weight 64.4 kg, SpO2 99%. Body mass index is 20.97 kg/m.  Diagnosis: Active Problems:   MDD (major depressive disorder), recurrent, severe, with psychosis (HCC)   Treatment Plan Summary:   Safety and Monitoring:             -- Voluntary admission to inpatient psychiatric unit for safety, stabilization and treatment             -- Daily contact with patient to assess and evaluate symptoms and progress in treatment             -- Patient's case to be discussed in multi-disciplinary team meeting             -- Observation Level: q15 minute checks             -- Vital signs:  q12 hours             -- Precautions: suicide, elopement, and assault   2. Psychiatric Diagnoses and Treatment:   - Zoloft  100 mg once daily          - Trazodone  25 mg at bedtime as needed for sleep - dose decreased per patient request due to feeling 50 mg was too much - CIWA protocol - As needed Librium      -- The risks/benefits/side-effects/alternatives to this medication were discussed in detail with the  patient and time was given for questions. The patient consents to medication trial.                -- Metabolic profile and EKG monitoring obtained while on an atypical antipsychotic (BMI: Lipid Panel: HbgA1c: QTc:)              -- Encouraged patient to participate in unit milieu and in scheduled group therapies                            3. Medical Issues Being Addressed:  No acute concerns.    4. Discharge Planning:   -- Social work and case management to assist with discharge planning and identification of hospital follow-up needs prior to discharge  -- Estimated LOS: 5-7 days  Allyn Foil, MD 06/14/2024, 12:03 PM

## 2024-06-15 DIAGNOSIS — F323 Major depressive disorder, single episode, severe with psychotic features: Secondary | ICD-10-CM | POA: Diagnosis not present

## 2024-06-15 NOTE — Plan of Care (Signed)
   Problem: Education: Goal: Knowledge of the prescribed therapeutic regimen will improve Outcome: Progressing   Problem: Activity: Goal: Interest or engagement in leisure activities will improve Outcome: Progressing   Problem: Health Behavior/Discharge Planning: Goal: Compliance with therapeutic regimen will improve Outcome: Progressing

## 2024-06-15 NOTE — Group Note (Signed)
 [facility] LCSW Group Therapy Note   Group Date: 06/15/2024 Start Time: 1300 End Time: 1345   Type of Therapy/Topic:  Group Therapy:  Balance in Life  Participation Level:  Active   Description of Group:    This group will address the concept of balance and how it feels and looks when one is unbalanced. Patients will be encouraged to process areas in their lives that are out of balance, and identify reasons for remaining unbalanced. Facilitators will guide patients utilizing problem- solving interventions to address and correct the stressor making their life unbalanced. Understanding and applying boundaries will be explored and addressed for obtaining  and maintaining a balanced life. Patients will be encouraged to explore ways to assertively make their unbalanced needs known to significant others in their lives, using other group members and facilitator for support and feedback.  Therapeutic Goals: Patient will identify two or more emotions or situations they have that consume much of in their lives. Patient will identify signs/triggers that life has become out of balance:  Patient will identify two ways to set boundaries in order to achieve balance in their lives:  Patient will demonstrate ability to communicate their needs through discussion and/or role plays  Summary of Patient Progress: Patient was present for the entirety of the group process. He was actively engaged with the discussion and his comments helped further the conversation. Pt appeared to have some insight into the topic and himself. He appeared open and receptive to feedback/comments from both his peers and the facilitator.  Therapeutic Modalities:   Cognitive Behavioral Therapy Solution-Focused Therapy Assertiveness Training   Nadara JONELLE Fam, LCSW

## 2024-06-15 NOTE — Group Note (Signed)
 Date:  06/15/2024 Time:  1:07 PM  Group Topic/Focus:  Emotional Education:   The focus of this group is to discuss what feelings/emotions are, and how they are experienced.    Participation Level:  Did Not Attend   Richard Moon 06/15/2024, 1:07 PM

## 2024-06-15 NOTE — Group Note (Signed)
 Date:  06/15/2024 Time:  8:58 PM  Group Topic/Focus:  Wrap-Up Group:   The focus of this group is to help patients review their daily goal of treatment and discuss progress on daily workbooks.    Participation Level:  Active  Participation Quality:  Appropriate  Affect:  Appropriate  Cognitive:  Appropriate  Insight: Good  Engagement in Group:  Engaged  Modes of Intervention:  Activity and Support  Additional Comments:    Richard Moon 06/15/2024, 8:58 PM

## 2024-06-15 NOTE — Group Note (Signed)
 Date:  06/15/2024 Time:  4:33 PM  Group Topic/Focus:  Rediscovering Joy:   The focus of this group is to explore various ways to relieve stress in a positive manner.    Participation Level:  Active  Participation Quality:  Appropriate  Affect:  Appropriate  Cognitive:  Appropriate  Insight: Appropriate  Engagement in Group:  Engaged  Modes of Intervention:  Activity and Socialization  Additional Comments:    Lateesha Bezold Byrd 06/15/2024, 4:33 PM

## 2024-06-15 NOTE — Progress Notes (Signed)
" °   06/14/24 2031  Psych Admission Type (Psych Patients Only)  Admission Status Voluntary  Psychosocial Assessment  Patient Complaints None  Eye Contact Fair  Facial Expression Flat  Affect Appropriate to circumstance  Speech Logical/coherent  Interaction Assertive  Motor Activity Other (Comment) (WDL)  Appearance/Hygiene Unremarkable  Behavior Characteristics Appropriate to situation  Mood Depressed  Aggressive Behavior  Effect No apparent injury  Thought Process  Coherency WDL  Content WDL  Delusions None reported or observed  Perception WDL  Hallucination None reported or observed  Judgment Limited  Confusion None  Danger to Self  Current suicidal ideation? Denies  Agreement Not to Harm Self Yes  Description of Agreement Verbal  Danger to Others  Danger to Others None reported or observed    "

## 2024-06-15 NOTE — Progress Notes (Signed)
 [facility] MD Progress Note  06/15/2024 3:07 PM Richard Moon  MRN:  [id number]  From initial presentation:   Richard Moon is a 63 y.o. year-old male presents to the ED with chief complaint of suicidal and homicidal thoughts.  States that his medications have not been working and he feels increasingly irritable.  States that he is now having thoughts of wanting to hurt himself and hurt others.  States that he has been using cocaine, and then uses alcohol to try and go to sleep.  He denies any recent illnesses.  Denies fever, cough, cold symptoms.  Subjective:  Chart reviewed, case discussed in multidisciplinary meeting, patient seen during rounds.   06/15/24: Patient is noted to be participating in groups.  He reports ongoing improvement in his depression and anxiety.  He denies SI/HI/plan and denies hallucinations.  He is taking his medications and tolerating them well.  He is agreeable to outpatient follow-up and ongoing psychotherapy.  06/14/24: Patient is noted to be resting in bed.  He reports improvement in his depression and anxiety.  He remains frustrated about unable to find a place to transition out after discharge.  He informed the provider that he is confused about the reason why he got declined from [facility].  He denies SI/HI/plan and denies hallucinations.  He is not displaying any withdrawal symptoms from alcohol or drugs.  He has fair appetite and sleep and participating   06/13/24: Patient is noted to be sitting in his room reviewing the list of placements that he needs to call.  Patient reports that his mood is improving on the current medication and denies feeling hopeless or helpless.  He denies SI/HI/plan and denies hallucinations.  Per nursing patient is taking medications with no reported side effects.  06/12/2024: Patient is noted to be resting in bed.  He offers no complaints.  He reports ongoing depression and anxiety due to his homeless situation and not being accepted to  inpatient rehab programs.  Patient reports working with [facility] before and is confused about their denial of acceptance.  He rates his depression as 7 out of 10, 10 being the worst and anxiety as 8 out of 10.  He reports having hyperstartle with loud sounds on the unit.  He denies SI/HI/plan and denies hallucination.  He is taking his medications with no reported side effect 12/21: Patient found resting in his room. He reports he is doing alright. He denies SI, HI, and VH. He endorses AH of people crying. He expresses that he has come out of his room for food and groups. Endorses attending to ADLs. He reports that his mind is still racing while he is awake.   Denies concerns/complaints. He continues to remain interested in rehab/housing resources.   12/20: Patient found resting in his room. He reports his mood as down. He denies SI, HI, and AVH. He reports he is tired. Endorses sleeping adequately and that he sleeps a lot. Endorses eating adequately as well. Patient reports wanting to engage with social work for housing and rehabilitation.   12/19: Patient is found resting in his room. He engages with clinical research associate and is cooperative. He reports that he is doing okay. He is experiencing anxiety at times rating it a 9/10. He rates his depression a 7/10. He endorses using cocaine about three days ago and uses daily. He endorses also using THC occasionally.   Denies SI, HI, and VH. He initially endorses AH, however it does not appear to be due to primary thought  disorder. He described the AH as him having a conversation with myself and that it is just one voice. He endorses also hearing bells and shit sometimes.   Past Psychiatric History: see h&P Family History: History reviewed. No pertinent family history. Social History:  Social History   Substance and Sexual Activity  Alcohol Use Yes   Comment: Occasional alcohol use     Social History   Substance and Sexual Activity  Drug Use Yes    Types: Marijuana, Cocaine   Comment: daily cocaine use (reported $200-300/ daily); occasional cannabis use    Social History   Socioeconomic History   Marital status: Single    Spouse name: Not on file   Number of children: Not on file   Years of education: Not on file   Highest education level: Not on file  Occupational History   Not on file  Tobacco Use   Smoking status: Some Days    Current packs/day: 0.25    Average packs/day: 0.3 packs/day for 20.0 years (5.0 ttl pk-yrs)    Types: Cigarettes   Smokeless tobacco: Never  Vaping Use   Vaping status: Former  Substance and Sexual Activity   Alcohol use: Yes    Comment: Occasional alcohol use   Drug use: Yes    Types: Marijuana, Cocaine    Comment: daily cocaine use (reported $200-300/ daily); occasional cannabis use   Sexual activity: Yes    Birth control/protection: Condom  Other Topics Concern   Not on file  Social History Narrative   Not on file   Social Drivers of Health   Tobacco Use: High Risk (06/06/2024)   Patient History    Smoking Tobacco Use: Some Days    Smokeless Tobacco Use: Never    Passive Exposure: Not on file  Financial Resource Strain: Not on file  Food Insecurity: Food Insecurity Present (06/06/2024)   Epic    Worried About Programme Researcher, Broadcasting/film/video in the Last Year: Sometimes true    Ran Out of Food in the Last Year: Sometimes true  Transportation Needs: Unmet Transportation Needs (06/06/2024)   Epic    Lack of Transportation (Medical): Yes    Lack of Transportation (Non-Medical): Yes  Physical Activity: Not on file  Stress: Not on file  Social Connections: Not on file  Depression (PHQ2-9): High Risk (06/08/2023)   Depression (PHQ2-9)    PHQ-2 Score: 12  Alcohol Screen: Medium Risk (06/06/2024)   Alcohol Screen    Last Alcohol Screening Score (AUDIT): 14  Housing: High Risk (06/06/2024)   Epic    Unable to Pay for Housing in the Last Year: Yes    Number of Times Moved in the Last Year: 4     Homeless in the Last Year: Yes  Utilities: Not At Risk (06/06/2024)   Epic    Threatened with loss of utilities: No  Health Literacy: Not on file   Past Medical History:  Past Medical History:  Diagnosis Date   Cocaine abuse (HCC)    Homeless    Hypothyroidism 03/04/2024   Hypovitaminosis D 03/04/2024   Major depressive disorder, recurrent severe without psychotic features (HCC)    Prediabetes 03/04/2024   Substance induced mood disorder (HCC)     Past Surgical History:  Procedure Laterality Date   HERNIA REPAIR      Current Medications: Current Facility-Administered Medications  Medication Dose Route Frequency Provider Last Rate Last Admin   acetaminophen  (TYLENOL ) tablet 650 mg  650 mg Oral Q6H PRN Mardy,  Carolyn H, NP   650 mg at 06/12/24 2115   alum & mag hydroxide-simeth (MAALOX/MYLANTA) 200-200-20 MG/5ML suspension 30 mL  30 mL Oral Q4H PRN Coleman, Carolyn H, NP       haloperidol  (HALDOL ) tablet 5 mg  5 mg Oral TID PRN Mardy Elveria DEL, NP       And   diphenhydrAMINE  (BENADRYL ) capsule 50 mg  50 mg Oral TID PRN Mardy Elveria DEL, NP       haloperidol  lactate (HALDOL ) injection 5 mg  5 mg Intramuscular TID PRN Mardy Elveria DEL, NP       And   diphenhydrAMINE  (BENADRYL ) injection 50 mg  50 mg Intramuscular TID PRN Mardy Elveria DEL, NP       And   LORazepam  (ATIVAN ) injection 2 mg  2 mg Intramuscular TID PRN Mardy Elveria DEL, NP       haloperidol  lactate (HALDOL ) injection 10 mg  10 mg Intramuscular TID PRN Mardy Elveria DEL, NP       And   diphenhydrAMINE  (BENADRYL ) injection 50 mg  50 mg Intramuscular TID PRN Mardy Elveria DEL, NP       And   LORazepam  (ATIVAN ) injection 2 mg  2 mg Intramuscular TID PRN Coleman, Carolyn H, NP       feeding supplement (ENSURE PLUS HIGH PROTEIN) liquid 237 mL  237 mL Oral TID BM Lior Cartelli, MD   237 mL at 06/14/24 2109   hydrOXYzine  (ATARAX ) tablet 50 mg  50 mg Oral Q6H PRN Coleman, Carolyn H, NP   50 mg at 06/14/24  2109   levothyroxine  (SYNTHROID ) tablet 25 mcg  25 mcg Oral Q0600 Coleman, Carolyn H, NP   25 mcg at 06/15/24 0601   magnesium  hydroxide (MILK OF MAGNESIA) suspension 30 mL  30 mL Oral Daily PRN Coleman, Carolyn H, NP       multivitamin with minerals tablet 1 tablet  1 tablet Oral Daily Fia Hebert, MD   1 tablet at 06/15/24 9191   nicotine  polacrilex (NICORETTE ) gum 2 mg  2 mg Oral PRN Sutton Plake, MD   2 mg at 06/07/24 9171   sertraline  (ZOLOFT ) tablet 100 mg  100 mg Oral Daily May, Tanya, NP   100 mg at 06/15/24 9191   traZODone  (DESYREL ) tablet 25 mg  25 mg Oral QHS PRN Hunter, Crystal L, PA-C   25 mg at 06/14/24 2110    Lab Results:  No results found for this or any previous visit (from the past 48 hours).   Blood Alcohol level:  Lab Results  Component Value Date   ETH <15 06/05/2024   ETH 43 (H) 02/29/2024    Metabolic Disorder Labs: Lab Results  Component Value Date   HGBA1C 5.6 06/09/2024   MPG 114.02 06/09/2024   MPG 122.63 03/02/2024   No results found for: PROLACTIN Lab Results  Component Value Date   CHOL 178 06/09/2024   TRIG 92 06/09/2024   HDL 81 06/09/2024   CHOLHDL 2.2 06/09/2024   VLDL 18 06/09/2024   LDLCALC 78 06/09/2024   LDLCALC 100 (H) 03/02/2024    Physical Findings: AIMS:  , ,  ,  ,    CIWA:  CIWA-Ar Total: 0 COWS:      Psychiatric Specialty Exam:  Presentation  General Appearance:  Appropriate for Environment; Casual  Eye Contact: Fair  Speech: Clear and Coherent; Normal Rate  Speech Volume: Normal    Mood and Affect  Mood: Euthymic  Affect: Appropriate  Thought Process  Thought Processes: Coherent; Linear  Orientation:Full (Time, Place and Person)  Thought Content:Logical  Hallucinations: Denies Ideas of Reference:None  Suicidal Thoughts: Denies Homicidal Thoughts: Denies    Sensorium  Memory: Immediate Fair; Recent Fair  Judgment: Fair  Insight: Fair   Producer, Television/film/video: Fair  Attention Span: Fair  Recall: [employer] of Knowledge: Fair  Language: Fair   Psychomotor Activity  Psychomotor Activity: No data recorded   Musculoskeletal: Strength & Muscle Tone: within normal limits Gait & Station: normal Assets  Assets: Manufacturing Systems Engineer; Desire for Improvement    Physical Exam: Physical Exam Vitals and nursing note reviewed.  Constitutional:      Appearance: Normal appearance.  Pulmonary:     Effort: Pulmonary effort is normal.  Neurological:     Mental Status: He is alert and oriented to person, place, and time.  Psychiatric:        Mood and Affect: Mood normal.        Behavior: Behavior normal.    Review of Systems  Respiratory:  Negative for shortness of breath.   Cardiovascular:  Negative for chest pain.  Gastrointestinal:  Negative for diarrhea, nausea and vomiting.  Psychiatric/Behavioral:  Positive for hallucinations and substance abuse. Negative for depression and suicidal ideas. The patient is nervous/anxious.   All other systems reviewed and are negative.  Blood pressure 105/73, pulse 69, temperature 98.1 F (36.7 C), temperature source Oral, resp. rate 16, height 5' 9 (1.753 m), weight 64.4 kg, SpO2 99%. Body mass index is 20.97 kg/m.  Diagnosis: Active Problems:   MDD (major depressive disorder), recurrent, severe, with psychosis (HCC)   Treatment Plan Summary:   Safety and Monitoring:             -- Voluntary admission to inpatient psychiatric unit for safety, stabilization and treatment             -- Daily contact with patient to assess and evaluate symptoms and progress in treatment             -- Patient's case to be discussed in multi-disciplinary team meeting             -- Observation Level: q15 minute checks             -- Vital signs:  q12 hours             -- Precautions: suicide, elopement, and assault   2. Psychiatric Diagnoses and Treatment:   - Zoloft  100 mg once daily           - Trazodone  25 mg at bedtime as needed for sleep - dose decreased per patient request due to feeling 50 mg was too much - CIWA protocol - As needed Librium      -- The risks/benefits/side-effects/alternatives to this medication were discussed in detail with the patient and time was given for questions. The patient consents to medication trial.                -- Metabolic profile and EKG monitoring obtained while on an atypical antipsychotic (BMI: Lipid Panel: HbgA1c: QTc:)              -- Encouraged patient to participate in unit milieu and in scheduled group therapies                            3. Medical Issues Being Addressed:  No acute concerns.  4. Discharge Planning:   -- Social work and case management to assist with discharge planning and identification of hospital follow-up needs prior to discharge  -- Estimated LOS: 5-7 days  Allyn Foil, MD 06/15/2024, 3:07 PM

## 2024-06-15 NOTE — Progress Notes (Signed)
 Patient presents: Patient presents with flat affect but remains cooperative and med compliant.    SI/HI/AVH: Denies   Plan: Denies   Groups attended: 2/3   Appetite: Adequate. Attended meals.   Sleep: No sleep disturbances reported.   PRNS: N/A   Disturbances: No disturbances. Patient remains cooperative in milieu.    Questions/concerns: No further questions or concerns.   VS: BP 118/77 (BP Location: Left Arm)   Pulse 71   Temp 98.1 F (36.7 C) (Oral)   Resp 16   Ht 5' 9 (1.753 m)   Wt 64.4 kg   SpO2 99%   BMI 20.97 kg/m

## 2024-06-15 NOTE — Plan of Care (Signed)

## 2024-06-16 DIAGNOSIS — F332 Major depressive disorder, recurrent severe without psychotic features: Secondary | ICD-10-CM

## 2024-06-16 NOTE — BHH Counselor (Signed)
 Late note:   Referral information was sent to [facility] on 06/15/24.  [name]. Chaim, MSW, LCSW, LCAS 06/16/2024 8:15 AM

## 2024-06-16 NOTE — Group Note (Signed)
 Date:  06/16/2024 Time:  8:55 PM  Group Topic/Focus:  Overcoming Stress:   The focus of this group is to define stress and help patients assess their triggers.    Participation Level:  Active  Participation Quality:  Appropriate  Affect:  Appropriate  Cognitive:  Appropriate  Insight: Appropriate  Engagement in Group:  Engaged  Modes of Intervention:  Discussion and Education  Additional Comments:    Ginny JONETTA Galeazzi 06/16/2024, 8:55 PM

## 2024-06-16 NOTE — Progress Notes (Signed)
" °   06/15/24 2200  Psych Admission Type (Psych Patients Only)  Admission Status Voluntary  Psychosocial Assessment  Patient Complaints None  Eye Contact Fair  Facial Expression Flat  Affect Appropriate to circumstance  Speech Logical/coherent  Interaction Assertive  Motor Activity Other (Comment) (WDL)  Appearance/Hygiene Unremarkable  Behavior Characteristics Cooperative;Appropriate to situation  Mood Pleasant  Aggressive Behavior  Effect No apparent injury  Thought Process  Coherency WDL  Content WDL  Delusions None reported or observed  Perception WDL  Hallucination None reported or observed  Judgment Poor  Confusion None  Danger to Self  Current suicidal ideation? Denies  Agreement Not to Harm Self Yes  Description of Agreement Verbal  Danger to Others  Danger to Others None reported or observed    "

## 2024-06-16 NOTE — Progress Notes (Signed)
" °   06/16/24 2300  Psych Admission Type (Psych Patients Only)  Admission Status Voluntary  Psychosocial Assessment  Patient Complaints Anxiety  Eye Contact Seductive  Facial Expression Animated  Affect Flat  Speech Logical/coherent  Interaction Assertive  Motor Activity Slow  Appearance/Hygiene Unremarkable  Behavior Characteristics Cooperative;Appropriate to situation  Mood Sullen  Thought Process  Coherency WDL  Content WDL  Delusions None reported or observed  Perception WDL  Hallucination None reported or observed  Judgment WDL  Confusion None  Danger to Self  Current suicidal ideation? Denies  Agreement Not to Harm Self Yes (verbal)  Description of Agreement verbal  Danger to Others  Danger to Others None reported or observed    "

## 2024-06-16 NOTE — Group Note (Deleted)
 Date:  06/16/2024 Time:  8:47 PM  Group Topic/Focus:  Overcoming Stress:   The focus of this group is to define stress and help patients assess their triggers.     Participation Level:  {B[facility] PARTICIPATION OZCZO:77735}  Participation Quality:  {B[facility] PARTICIPATION QUALITY:22265}  Affect:  {BHH AFFECT:22266}  Cognitive:  {B[facility] COGNITIVE:22267}  Insight: {BHH Insight2:20797}  Engagement in Group:  {BHH ENGAGEMENT IN HMNLE:77731}  Modes of Intervention:  {BHH MODES OF INTERVENTION:22269}  Additional Comments:  PIERRETTE Ginny JONETTA Orma 06/16/2024, 8:47 PM

## 2024-06-16 NOTE — Group Note (Signed)
 Date:  06/16/2024 Time:  12:01 PM  Group Topic/Focus:  Emotional Education:   The focus of this group is to discuss what feelings/emotions are, and how they are experienced.    Participation Level:  Active  Participation Quality:  Appropriate  Affect:  Appropriate  Cognitive:  Appropriate  Insight: Appropriate  Engagement in Group:  Engaged  Modes of Intervention:  Activity and Discussion  Additional Comments:    Richard Moon June 06/16/2024, 12:01 PM

## 2024-06-16 NOTE — Progress Notes (Signed)
 [facility] MD Progress Note  06/16/2024 11:06 AM Richard Moon  MRN:  [id number]  From initial presentation:   Richard Moon is a 63 y.o. year-old male presents to the ED with chief complaint of suicidal and homicidal thoughts.  States that his medications have not been working and he feels increasingly irritable.  States that he is now having thoughts of wanting to hurt himself and hurt others.  States that he has been using cocaine, and then uses alcohol to try and go to sleep.  He denies any recent illnesses.  Denies fever, cough, cold symptoms.  Subjective:  Chart reviewed, case discussed in multidisciplinary meeting, patient seen during rounds.   06/16/2024: On interview today, patient is noted to be calm and cooperative, alert and oriented.  He reports improving depression rating it as low today.  He endorses situational anxiety, rating anxiety as 8 out of 10 at time of interview.  He reports good sleep and appetite.  He denies SI/HI/plan and denies hallucinations.  He is tolerating medication regimen well without adverse effects.  Will continue to monitor at this time due to need for safe discharge planning.  Per CSW, patient has been referred to [facility] [place].   06/15/24: Patient is noted to be participating in groups.  He reports ongoing improvement in his depression and anxiety.  He denies SI/HI/plan and denies hallucinations.  He is taking his medications and tolerating them well.  He is agreeable to outpatient follow-up and ongoing psychotherapy.  06/14/24: Patient is noted to be resting in bed.  He reports improvement in his depression and anxiety.  He remains frustrated about unable to find a place to transition out after discharge.  He informed the provider that he is confused about the reason why he got declined from [facility].  He denies SI/HI/plan and denies hallucinations.  He is not displaying any withdrawal symptoms from alcohol or drugs.  He has fair appetite and sleep and  participating   06/13/24: Patient is noted to be sitting in his room reviewing the list of placements that he needs to call.  Patient reports that his mood is improving on the current medication and denies feeling hopeless or helpless.  He denies SI/HI/plan and denies hallucinations.  Per nursing patient is taking medications with no reported side effects.  06/12/2024: Patient is noted to be resting in bed.  He offers no complaints.  He reports ongoing depression and anxiety due to his homeless situation and not being accepted to inpatient rehab programs.  Patient reports working with [facility] before and is confused about their denial of acceptance.  He rates his depression as 7 out of 10, 10 being the worst and anxiety as 8 out of 10.  He reports having hyperstartle with loud sounds on the unit.  He denies SI/HI/plan and denies hallucination.  He is taking his medications with no reported side effect 12/21: Patient found resting in his room. He reports he is doing alright. He denies SI, HI, and VH. He endorses AH of people crying. He expresses that he has come out of his room for food and groups. Endorses attending to ADLs. He reports that his mind is still racing while he is awake.   Denies concerns/complaints. He continues to remain interested in rehab/housing resources.   12/20: Patient found resting in his room. He reports his mood as down. He denies SI, HI, and AVH. He reports he is tired. Endorses sleeping adequately and that he sleeps a lot. Endorses eating  adequately as well. Patient reports wanting to engage with social work for housing and rehabilitation.   12/19: Patient is found resting in his room. He engages with clinical research associate and is cooperative. He reports that he is doing okay. He is experiencing anxiety at times rating it a 9/10. He rates his depression a 7/10. He endorses using cocaine about three days ago and uses daily. He endorses also using THC occasionally.   Denies SI,  HI, and VH. He initially endorses AH, however it does not appear to be due to primary thought disorder. He described the AH as him having a conversation with myself and that it is just one voice. He endorses also hearing bells and shit sometimes.   Past Psychiatric History: see h&P Family History: History reviewed. No pertinent family history. Social History:  Social History   Substance and Sexual Activity  Alcohol Use Yes   Comment: Occasional alcohol use     Social History   Substance and Sexual Activity  Drug Use Yes   Types: Marijuana, Cocaine   Comment: daily cocaine use (reported $200-300/ daily); occasional cannabis use    Social History   Socioeconomic History   Marital status: Single    Spouse name: Not on file   Number of children: Not on file   Years of education: Not on file   Highest education level: Not on file  Occupational History   Not on file  Tobacco Use   Smoking status: Some Days    Current packs/day: 0.25    Average packs/day: 0.3 packs/day for 20.0 years (5.0 ttl pk-yrs)    Types: Cigarettes   Smokeless tobacco: Never  Vaping Use   Vaping status: Former  Substance and Sexual Activity   Alcohol use: Yes    Comment: Occasional alcohol use   Drug use: Yes    Types: Marijuana, Cocaine    Comment: daily cocaine use (reported $200-300/ daily); occasional cannabis use   Sexual activity: Yes    Birth control/protection: Condom  Other Topics Concern   Not on file  Social History Narrative   Not on file   Social Drivers of Health   Tobacco Use: High Risk (06/06/2024)   Patient History    Smoking Tobacco Use: Some Days    Smokeless Tobacco Use: Never    Passive Exposure: Not on file  Financial Resource Strain: Not on file  Food Insecurity: Food Insecurity Present (06/06/2024)   Epic    Worried About Programme Researcher, Broadcasting/film/video in the Last Year: Sometimes true    Ran Out of Food in the Last Year: Sometimes true  Transportation Needs: Unmet  Transportation Needs (06/06/2024)   Epic    Lack of Transportation (Medical): Yes    Lack of Transportation (Non-Medical): Yes  Physical Activity: Not on file  Stress: Not on file  Social Connections: Not on file  Depression (PHQ2-9): High Risk (06/08/2023)   Depression (PHQ2-9)    PHQ-2 Score: 12  Alcohol Screen: Medium Risk (06/06/2024)   Alcohol Screen    Last Alcohol Screening Score (AUDIT): 14  Housing: High Risk (06/06/2024)   Epic    Unable to Pay for Housing in the Last Year: Yes    Number of Times Moved in the Last Year: 4    Homeless in the Last Year: Yes  Utilities: Not At Risk (06/06/2024)   Epic    Threatened with loss of utilities: No  Health Literacy: Not on file   Past Medical History:  Past Medical History:  Diagnosis Date   Cocaine abuse (HCC)    Homeless    Hypothyroidism 03/04/2024   Hypovitaminosis D 03/04/2024   Major depressive disorder, recurrent severe without psychotic features (HCC)    Prediabetes 03/04/2024   Substance induced mood disorder (HCC)     Past Surgical History:  Procedure Laterality Date   HERNIA REPAIR      Current Medications: Current Facility-Administered Medications  Medication Dose Route Frequency Provider Last Rate Last Admin   acetaminophen  (TYLENOL ) tablet 650 mg  650 mg Oral Q6H PRN Coleman, Carolyn H, NP   650 mg at 06/12/24 2115   alum & mag hydroxide-simeth (MAALOX/MYLANTA) 200-200-20 MG/5ML suspension 30 mL  30 mL Oral Q4H PRN Coleman, Carolyn H, NP       haloperidol  (HALDOL ) tablet 5 mg  5 mg Oral TID PRN Mardy Elveria DEL, NP       And   diphenhydrAMINE  (BENADRYL ) capsule 50 mg  50 mg Oral TID PRN Mardy Elveria DEL, NP       haloperidol  lactate (HALDOL ) injection 5 mg  5 mg Intramuscular TID PRN Mardy Elveria DEL, NP       And   diphenhydrAMINE  (BENADRYL ) injection 50 mg  50 mg Intramuscular TID PRN Mardy Elveria DEL, NP       And   LORazepam  (ATIVAN ) injection 2 mg  2 mg Intramuscular TID PRN Mardy Elveria DEL, NP       haloperidol  lactate (HALDOL ) injection 10 mg  10 mg Intramuscular TID PRN Mardy Elveria DEL, NP       And   diphenhydrAMINE  (BENADRYL ) injection 50 mg  50 mg Intramuscular TID PRN Mardy Elveria DEL, NP       And   LORazepam  (ATIVAN ) injection 2 mg  2 mg Intramuscular TID PRN Mardy Elveria DEL, NP       feeding supplement (ENSURE PLUS HIGH PROTEIN) liquid 237 mL  237 mL Oral TID BM Jadapalle, Sree, MD   237 mL at 06/15/24 2000   hydrOXYzine  (ATARAX ) tablet 50 mg  50 mg Oral Q6H PRN Coleman, Carolyn H, NP   50 mg at 06/14/24 2109   levothyroxine  (SYNTHROID ) tablet 25 mcg  25 mcg Oral Q0600 Coleman, Carolyn H, NP   25 mcg at 06/16/24 9360   magnesium  hydroxide (MILK OF MAGNESIA) suspension 30 mL  30 mL Oral Daily PRN Mardy Elveria DEL, NP       multivitamin with minerals tablet 1 tablet  1 tablet Oral Daily Jadapalle, Sree, MD   1 tablet at 06/16/24 0844   nicotine  polacrilex (NICORETTE ) gum 2 mg  2 mg Oral PRN Jadapalle, Sree, MD   2 mg at 06/07/24 9171   sertraline  (ZOLOFT ) tablet 100 mg  100 mg Oral Daily May, Tanya, NP   100 mg at 06/16/24 9155   traZODone  (DESYREL ) tablet 25 mg  25 mg Oral QHS PRN Eureka Valdes L, PA-C   25 mg at 06/15/24 2118    Lab Results:  No results found for this or any previous visit (from the past 48 hours).   Blood Alcohol level:  Lab Results  Component Value Date   ETH <15 06/05/2024   ETH 43 (H) 02/29/2024    Metabolic Disorder Labs: Lab Results  Component Value Date   HGBA1C 5.6 06/09/2024   MPG 114.02 06/09/2024   MPG 122.63 03/02/2024   No results found for: PROLACTIN Lab Results  Component Value Date   CHOL 178 06/09/2024   TRIG 92 06/09/2024  HDL 81 06/09/2024   CHOLHDL 2.2 06/09/2024   VLDL 18 06/09/2024   LDLCALC 78 06/09/2024   LDLCALC 100 (H) 03/02/2024    Physical Findings: AIMS:  , ,  ,  ,    CIWA:  CIWA-Ar Total: 0 COWS:      Psychiatric Specialty Exam:  Presentation  General Appearance:   Appropriate for Environment; Casual  Eye Contact: Fair  Speech: Clear and Coherent; Normal Rate  Speech Volume: Normal    Mood and Affect  Mood: Anxious  Affect: Appropriate   Thought Process  Thought Processes: Coherent; Linear  Orientation:Full (Time, Place and Person)  Thought Content:Logical  Hallucinations: Denies Ideas of Reference:None  Suicidal Thoughts: Denies Homicidal Thoughts: Denies    Sensorium  Memory: Immediate Fair; Recent Fair  Judgment: Fair  Insight: Fair   Art Therapist  Concentration: Fair  Attention Span: Fair  Recall: [employer] of Knowledge: Fair  Language: Fair   Psychomotor Activity  Psychomotor Activity: Normal   Musculoskeletal: Strength & Muscle Tone: within normal limits Gait & Station: normal Assets  Assets: Manufacturing Systems Engineer; Desire for Improvement    Physical Exam: Physical Exam Vitals and nursing note reviewed.  Constitutional:      Appearance: Normal appearance.  Pulmonary:     Effort: Pulmonary effort is normal.  Neurological:     Mental Status: He is alert and oriented to person, place, and time.  Psychiatric:        Mood and Affect: Mood normal.        Behavior: Behavior normal.    Review of Systems  Respiratory:  Negative for shortness of breath.   Cardiovascular:  Negative for chest pain.  Gastrointestinal:  Negative for diarrhea, nausea and vomiting.  Psychiatric/Behavioral:  Positive for hallucinations and substance abuse. Negative for depression and suicidal ideas. The patient is nervous/anxious.   All other systems reviewed and are negative.  Blood pressure 106/74, pulse 72, temperature 98.1 F (36.7 C), temperature source Oral, resp. rate 16, height 5' 9 (1.753 m), weight 64.4 kg, SpO2 97%. Body mass index is 20.97 kg/m.  Diagnosis: Active Problems:   MDD (major depressive disorder), recurrent, severe, with psychosis (HCC)   Treatment Plan Summary:    Safety and Monitoring:             -- Voluntary admission to inpatient psychiatric unit for safety, stabilization and treatment             -- Daily contact with patient to assess and evaluate symptoms and progress in treatment             -- Patient's case to be discussed in multi-disciplinary team meeting             -- Observation Level: q15 minute checks             -- Vital signs:  q12 hours             -- Precautions: suicide, elopement, and assault   2. Psychiatric Diagnoses and Treatment:   - Zoloft  100 mg once daily          - Trazodone  25 mg at bedtime as needed for sleep - dose decreased per patient request due to feeling 50 mg was too much - CIWA protocol - As needed Librium      -- The risks/benefits/side-effects/alternatives to this medication were discussed in detail with the patient and time was given for questions. The patient consents to medication trial.                --  Metabolic profile and EKG monitoring obtained while on an atypical antipsychotic (BMI: Lipid Panel: HbgA1c: QTc:)              -- Encouraged patient to participate in unit milieu and in scheduled group therapies                            3. Medical Issues Being Addressed:  No acute concerns.    4. Discharge Planning:             -- Likely Monday or Tuesday  -- Social work and case management to assist with discharge planning and identification of hospital follow-up needs prior to discharge  -- Estimated LOS: 10-12 days  [employer], PA-C 06/16/2024, 11:06 AM

## 2024-06-16 NOTE — Plan of Care (Signed)
   Problem: Coping: Goal: Coping ability will improve Outcome: Progressing

## 2024-06-16 NOTE — Group Note (Signed)
 Date:  06/16/2024 Time:  5:54 PM  Group Topic/Focus:  Activity Group: The focus of the group is to promote activity for the patients and to encourage them to go outside to the courtyard and get some fresh air and some exercise.    Participation Level:  Did Not Attend   Richard Moon 06/16/2024, 5:54 PM

## 2024-06-16 NOTE — Plan of Care (Signed)
" °  Problem: Activity: Goal: Interest or engagement in leisure activities will improve Outcome: Progressing   Problem: Coping: Goal: Coping ability will improve Outcome: Progressing   Problem: Coping: Goal: Will verbalize feelings Outcome: Progressing   Problem: Health Behavior/Discharge Planning: Goal: Ability to make decisions will improve Outcome: Progressing   Problem: Self-Concept: Goal: Will verbalize positive feelings about self Outcome: Progressing   "

## 2024-06-16 NOTE — [facility] Counselor (Addendum)
 ADDENDUM  CSW called back and confirmed receipt of requested updated progress notes. It was confirmed that they received it. CSW was informed that they would contact CSW regarding any admission updates. No other concerns expressed. Contact ended without incident.   [name]. Chaim, MSW, LCSW, LCAS 06/16/2024 1:49 PM  CSW called [facility] [place] [phone]) regarding referral. It was confirmed that they received it and pt would need to do phone screening. No other concerns expressed. Contact ended without incident.   CSW assisted pt with getting in contact with [facility] for phone screening. Phone screening completed.   CSW was phoned by [facility]. She requested updated progress notes for pt.   Updated progress noted faxed to [facility] [phone]).  [name]. Chaim, MSW, LCSW, LCAS 06/16/2024 11:28 AM

## 2024-06-17 DIAGNOSIS — F332 Major depressive disorder, recurrent severe without psychotic features: Secondary | ICD-10-CM | POA: Diagnosis not present

## 2024-06-17 NOTE — Progress Notes (Signed)
" °   06/17/24 0900  Psych Admission Type (Psych Patients Only)  Admission Status Voluntary  Psychosocial Assessment  Patient Complaints Anxiety (8/10 reports anxiety about where he will go after DC)  Eye Contact Fair  Facial Expression Flat  Affect Depressed  Speech Logical/coherent  Interaction Assertive  Motor Activity Other (Comment) (appropriate)  Appearance/Hygiene Unremarkable  Behavior Characteristics Cooperative;Appropriate to situation  Mood Depressed  Thought Process  Coherency WDL  Content WDL  Delusions None reported or observed  Perception WDL  Hallucination None reported or observed  Judgment WDL  Confusion None  Danger to Self  Current suicidal ideation? Denies  Agreement Not to Harm Self Yes    "

## 2024-06-17 NOTE — Group Note (Signed)
 Date:  06/17/2024 Time:  6:06 PM  Group Topic/Focus:  Dimensions of Wellness:   The focus of this group is to introduce the topic of wellness and discuss the role each dimension of wellness plays in total health.    Participation Level:  Active  Participation Quality:  Appropriate  Affect:  Appropriate  Cognitive:  Appropriate  Insight: Appropriate  Engagement in Group:  Engaged  Modes of Intervention:  Activity  Additional Comments:    Camellia HERO Daneya Hartgrove 06/17/2024, 6:06 PM

## 2024-06-17 NOTE — Progress Notes (Signed)
 [facility] MD Progress Note  06/17/2024 8:40 AM Richard Moon  MRN:  [id number]  From initial presentation:   Richard Moon is a 63 y.o. year-old male presents to the ED with chief complaint of suicidal and homicidal thoughts.  States that his medications have not been working and he feels increasingly irritable.  States that he is now having thoughts of wanting to hurt himself and hurt others.  States that he has been using cocaine, and then uses alcohol to try and go to sleep.  He denies any recent illnesses.  Denies fever, cough, cold symptoms.  Subjective:  Chart reviewed, case discussed in multidisciplinary meeting, patient seen during rounds.   12/27: On interview today, patient is found resting in bed.  Patient endorses situational depression and anxiety due to uncertainty about where he will go upon hospital discharge.  He rates current depression as 7 out of 10 and anxiety is 8 out of 10 today.  Patient is tolerating medication regimen well without adverse effects and declines Zoloft  dose increase at this time, feeling that current doses been overall effective.  He denies SI/HI/plan and denies hallucinations.  Will continue to monitor at this time due to need for safe discharge planning.  Patient has completed phone screening with [facility] [place].  06/16/2024: On interview today, patient is noted to be calm and cooperative, alert and oriented.  He reports improving depression rating it as low today.  He endorses situational anxiety, rating anxiety as 8 out of 10 at time of interview.  He reports good sleep and appetite.  He denies SI/HI/plan and denies hallucinations.  He is tolerating medication regimen well without adverse effects.  Will continue to monitor at this time due to need for safe discharge planning.  Per CSW, patient has been referred to [facility] [place].   06/15/24: Patient is noted to be participating in groups.  He reports ongoing improvement in his depression and  anxiety.  He denies SI/HI/plan and denies hallucinations.  He is taking his medications and tolerating them well.  He is agreeable to outpatient follow-up and ongoing psychotherapy.  06/14/24: Patient is noted to be resting in bed.  He reports improvement in his depression and anxiety.  He remains frustrated about unable to find a place to transition out after discharge.  He informed the provider that he is confused about the reason why he got declined from [facility].  He denies SI/HI/plan and denies hallucinations.  He is not displaying any withdrawal symptoms from alcohol or drugs.  He has fair appetite and sleep and participating   06/13/24: Patient is noted to be sitting in his room reviewing the list of placements that he needs to call.  Patient reports that his mood is improving on the current medication and denies feeling hopeless or helpless.  He denies SI/HI/plan and denies hallucinations.  Per nursing patient is taking medications with no reported side effects.  06/12/2024: Patient is noted to be resting in bed.  He offers no complaints.  He reports ongoing depression and anxiety due to his homeless situation and not being accepted to inpatient rehab programs.  Patient reports working with [facility] before and is confused about their denial of acceptance.  He rates his depression as 7 out of 10, 10 being the worst and anxiety as 8 out of 10.  He reports having hyperstartle with loud sounds on the unit.  He denies SI/HI/plan and denies hallucination.  He is taking his medications with no reported side  effect 12/21: Patient found resting in his room. He reports he is doing alright. He denies SI, HI, and VH. He endorses AH of people crying. He expresses that he has come out of his room for food and groups. Endorses attending to ADLs. He reports that his mind is still racing while he is awake.   Denies concerns/complaints. He continues to remain interested in rehab/housing resources.   12/20:  Patient found resting in his room. He reports his mood as down. He denies SI, HI, and AVH. He reports he is tired. Endorses sleeping adequately and that he sleeps a lot. Endorses eating adequately as well. Patient reports wanting to engage with social work for housing and rehabilitation.   12/19: Patient is found resting in his room. He engages with clinical research associate and is cooperative. He reports that he is doing okay. He is experiencing anxiety at times rating it a 9/10. He rates his depression a 7/10. He endorses using cocaine about three days ago and uses daily. He endorses also using THC occasionally.   Denies SI, HI, and VH. He initially endorses AH, however it does not appear to be due to primary thought disorder. He described the AH as him having a conversation with myself and that it is just one voice. He endorses also hearing bells and shit sometimes.   Past Psychiatric History: see h&P Family History: History reviewed. No pertinent family history. Social History:  Social History   Substance and Sexual Activity  Alcohol Use Yes   Comment: Occasional alcohol use     Social History   Substance and Sexual Activity  Drug Use Yes   Types: Marijuana, Cocaine   Comment: daily cocaine use (reported $200-300/ daily); occasional cannabis use    Social History   Socioeconomic History   Marital status: Single    Spouse name: Not on file   Number of children: Not on file   Years of education: Not on file   Highest education level: Not on file  Occupational History   Not on file  Tobacco Use   Smoking status: Some Days    Current packs/day: 0.25    Average packs/day: 0.3 packs/day for 20.0 years (5.0 ttl pk-yrs)    Types: Cigarettes   Smokeless tobacco: Never  Vaping Use   Vaping status: Former  Substance and Sexual Activity   Alcohol use: Yes    Comment: Occasional alcohol use   Drug use: Yes    Types: Marijuana, Cocaine    Comment: daily cocaine use (reported $200-300/  daily); occasional cannabis use   Sexual activity: Yes    Birth control/protection: Condom  Other Topics Concern   Not on file  Social History Narrative   Not on file   Social Drivers of Health   Tobacco Use: High Risk (06/06/2024)   Patient History    Smoking Tobacco Use: Some Days    Smokeless Tobacco Use: Never    Passive Exposure: Not on file  Financial Resource Strain: Not on file  Food Insecurity: Food Insecurity Present (06/06/2024)   Epic    Worried About Programme Researcher, Broadcasting/film/video in the Last Year: Sometimes true    [employer] of Food in the Last Year: Sometimes true  Transportation Needs: Unmet Transportation Needs (06/06/2024)   Epic    Lack of Transportation (Medical): Yes    Lack of Transportation (Non-Medical): Yes  Physical Activity: Not on file  Stress: Not on file  Social Connections: Not on file  Depression (PHQ2-9): High Risk (  06/08/2023)   Depression (PHQ2-9)    PHQ-2 Score: 12  Alcohol Screen: Medium Risk (06/06/2024)   Alcohol Screen    Last Alcohol Screening Score (AUDIT): 14  Housing: High Risk (06/06/2024)   Epic    Unable to Pay for Housing in the Last Year: Yes    Number of Times Moved in the Last Year: 4    Homeless in the Last Year: Yes  Utilities: Not At Risk (06/06/2024)   Epic    Threatened with loss of utilities: No  Health Literacy: Not on file   Past Medical History:  Past Medical History:  Diagnosis Date   Cocaine abuse (HCC)    Homeless    Hypothyroidism 03/04/2024   Hypovitaminosis D 03/04/2024   Major depressive disorder, recurrent severe without psychotic features (HCC)    Prediabetes 03/04/2024   Substance induced mood disorder (HCC)     Past Surgical History:  Procedure Laterality Date   HERNIA REPAIR      Current Medications: Current Facility-Administered Medications  Medication Dose Route Frequency Provider Last Rate Last Admin   acetaminophen  (TYLENOL ) tablet 650 mg  650 mg Oral Q6H PRN Coleman, Carolyn H, NP   650 mg at  06/16/24 1705   alum & mag hydroxide-simeth (MAALOX/MYLANTA) 200-200-20 MG/5ML suspension 30 mL  30 mL Oral Q4H PRN Coleman, Carolyn H, NP       haloperidol  (HALDOL ) tablet 5 mg  5 mg Oral TID PRN Mardy Elveria DEL, NP       And   diphenhydrAMINE  (BENADRYL ) capsule 50 mg  50 mg Oral TID PRN Mardy Elveria DEL, NP       haloperidol  lactate (HALDOL ) injection 5 mg  5 mg Intramuscular TID PRN Mardy Elveria DEL, NP       And   diphenhydrAMINE  (BENADRYL ) injection 50 mg  50 mg Intramuscular TID PRN Mardy Elveria DEL, NP       And   LORazepam  (ATIVAN ) injection 2 mg  2 mg Intramuscular TID PRN Mardy Elveria DEL, NP       haloperidol  lactate (HALDOL ) injection 10 mg  10 mg Intramuscular TID PRN Mardy Elveria DEL, NP       And   diphenhydrAMINE  (BENADRYL ) injection 50 mg  50 mg Intramuscular TID PRN Mardy Elveria DEL, NP       And   LORazepam  (ATIVAN ) injection 2 mg  2 mg Intramuscular TID PRN Mardy Elveria DEL, NP       feeding supplement (ENSURE PLUS HIGH PROTEIN) liquid 237 mL  237 mL Oral TID BM Jadapalle, Sree, MD   237 mL at 06/16/24 2106   hydrOXYzine  (ATARAX ) tablet 50 mg  50 mg Oral Q6H PRN Mardy Elveria DEL, NP   50 mg at 06/14/24 2109   levothyroxine  (SYNTHROID ) tablet 25 mcg  25 mcg Oral Q0600 Mardy Elveria DEL, NP   25 mcg at 06/17/24 9287   magnesium  hydroxide (MILK OF MAGNESIA) suspension 30 mL  30 mL Oral Daily PRN Mardy Elveria DEL, NP       multivitamin with minerals tablet 1 tablet  1 tablet Oral Daily Jadapalle, Sree, MD   1 tablet at 06/16/24 0844   nicotine  polacrilex (NICORETTE ) gum 2 mg  2 mg Oral PRN Jadapalle, Sree, MD   2 mg at 06/07/24 9171   sertraline  (ZOLOFT ) tablet 100 mg  100 mg Oral Daily May, Tanya, NP   100 mg at 06/16/24 0844   traZODone  (DESYREL ) tablet 25 mg  25 mg Oral QHS PRN  Aimee Timmons L, PA-C   25 mg at 06/16/24 2105    Lab Results:  No results found for this or any previous visit (from the past 48 hours).   Blood Alcohol level:  Lab  Results  Component Value Date   ETH <15 06/05/2024   ETH 43 (H) 02/29/2024    Metabolic Disorder Labs: Lab Results  Component Value Date   HGBA1C 5.6 06/09/2024   MPG 114.02 06/09/2024   MPG 122.63 03/02/2024   No results found for: PROLACTIN Lab Results  Component Value Date   CHOL 178 06/09/2024   TRIG 92 06/09/2024   HDL 81 06/09/2024   CHOLHDL 2.2 06/09/2024   VLDL 18 06/09/2024   LDLCALC 78 06/09/2024   LDLCALC 100 (H) 03/02/2024    Physical Findings: AIMS:  , ,  ,  ,    CIWA:  CIWA-Ar Total: 0 COWS:      Psychiatric Specialty Exam:  Presentation  General Appearance:  Appropriate for Environment; Casual  Eye Contact: Fair  Speech: Clear and Coherent; Normal Rate  Speech Volume: Normal    Mood and Affect  Mood: Anxious, depressed  Affect: Appropriate   Thought Process  Thought Processes: Coherent; Linear  Orientation:Full (Time, Place and Person)  Thought Content:Logical  Hallucinations: Denies Ideas of Reference:None  Suicidal Thoughts: Denies Homicidal Thoughts: Denies    Sensorium  Memory: Immediate Fair; Recent Fair  Judgment: Fair  Insight: Fair   Art Therapist  Concentration: Fair  Attention Span: Fair  Recall: [employer] of Knowledge: Fair  Language: Fair   Psychomotor Activity  Psychomotor Activity: Normal   Musculoskeletal: Strength & Muscle Tone: within normal limits Gait & Station: normal Assets  Assets: Manufacturing Systems Engineer; Desire for Improvement    Physical Exam: Physical Exam Vitals and nursing note reviewed.  Constitutional:      Appearance: Normal appearance.  Pulmonary:     Effort: Pulmonary effort is normal.  Neurological:     Mental Status: He is alert and oriented to person, place, and time.  Psychiatric:        Mood and Affect: Mood normal.        Behavior: Behavior normal.    Review of Systems  Respiratory:  Negative for shortness of breath.    Cardiovascular:  Negative for chest pain.  Gastrointestinal:  Negative for diarrhea, nausea and vomiting.  Psychiatric/Behavioral:  Positive for hallucinations and substance abuse. Negative for depression and suicidal ideas. The patient is nervous/anxious.   All other systems reviewed and are negative.  Blood pressure 105/70, pulse 66, temperature 97.8 F (36.6 C), temperature source Oral, resp. rate 18, height 5' 9 (1.753 m), weight 64.4 kg, SpO2 99%. Body mass index is 20.97 kg/m.  Diagnosis: Active Problems:   MDD (major depressive disorder), recurrent, severe, with psychosis (HCC)   Treatment Plan Summary:   Safety and Monitoring:             -- Voluntary admission to inpatient psychiatric unit for safety, stabilization and treatment             -- Daily contact with patient to assess and evaluate symptoms and progress in treatment             -- Patient's case to be discussed in multi-disciplinary team meeting             -- Observation Level: q15 minute checks             -- Vital signs:  q12 hours             --  Precautions: suicide, elopement, and assault   2. Psychiatric Diagnoses and Treatment:   - Zoloft  100 mg once daily          - Trazodone  25 mg at bedtime as needed for sleep - dose decreased per patient request due to feeling 50 mg was too much    -- The risks/benefits/side-effects/alternatives to this medication were discussed in detail with the patient and time was given for questions. The patient consents to medication trial.                -- Metabolic profile and EKG monitoring obtained while on an atypical antipsychotic (BMI: Lipid Panel: HbgA1c: QTc:)              -- Encouraged patient to participate in unit milieu and in scheduled group therapies                            3. Medical Issues Being Addressed:  No acute concerns.    4. Discharge Planning:             -- Likely Monday or Tuesday  -- Social work and case management to assist with  discharge planning and identification of hospital follow-up needs prior to discharge  -- Estimated LOS: 10-12 days Case discussed with attending physician, Dr. Hellen, who is in agreement with current plan. Norita Meigs LITTIE Lukes, PA-C 06/17/2024, 8:40 AM

## 2024-06-17 NOTE — Plan of Care (Signed)
   Problem: Education: Goal: Knowledge of the prescribed therapeutic regimen will improve Outcome: Progressing

## 2024-06-17 NOTE — Group Note (Signed)
 Date:  06/17/2024 Time:  1:37 PM  Group Topic/Focus:  Building Self Esteem:   The Focus of this group is helping patients become aware of the effects of self-esteem on their lives, the things they and others do that enhance or undermine their self-esteem, seeing the relationship between their level of self-esteem and the choices they make and learning ways to enhance self-esteem.    Participation Level:  Active  Participation Quality:  Appropriate  Affect:  Appropriate  Cognitive:  Appropriate  Insight: Appropriate  Engagement in Group:  Engaged  Modes of Intervention:  Activity and Discussion  Additional Comments:    Richard Moon [name] June 06/17/2024, 1:37 PM

## 2024-06-17 NOTE — Group Note (Signed)
 LCSW Group Therapy Note   Group Date: 06/17/2024 Start Time: 1620 End Time: 1650   Type of Therapy and Topic:  Group Therapy: Managing Intrusive Thoughts  Participation Level:  Did Not Attend  Description of Group: The purpose of this group is to assist patients in learning how to regulate negative intrusive thoughts.  Intrusive thoughts are unwanted thoughts or vivid images that suddenly enter your mind.  They may be disturbing to the person experiencing them and may trigger feelings like anxiety, shame, or disgust.  Emphasis will be placed on coping with negative intrusive thoughts in situations and using positive strategies to combat negative intrusive thoughts and using positive strategies combat negative thoughts.  Therapeutic Goals:  1. Patient will identify the difference of an inconsequential thought vs an urgent thought. 2.Patient will discuss when they need to seek assistance with negative intrusive thoughts.           Summary of Patient Progress:   Patient did not attend group.  Therapeutic Modalities:  Cognitive Behavioral Therapy Dialectical Behavior Therapy  Makita Blow W Ramiz Turpin, LCSWA 06/17/2024  5:29 PM

## 2024-06-18 ENCOUNTER — Inpatient Hospital Stay: Payer: MEDICAID

## 2024-06-18 DIAGNOSIS — F332 Major depressive disorder, recurrent severe without psychotic features: Secondary | ICD-10-CM | POA: Diagnosis not present

## 2024-06-18 LAB — CBC WITH DIFFERENTIAL/PLATELET
Abs Immature Granulocytes: 0.03 K/uL (ref 0.00–0.07)
Basophils Absolute: 0 K/uL (ref 0.0–0.1)
Basophils Relative: 0 %
Eosinophils Absolute: 0.4 K/uL (ref 0.0–0.5)
Eosinophils Relative: 9 %
HCT: 45.2 % (ref 39.0–52.0)
Hemoglobin: 14.8 g/dL (ref 13.0–17.0)
Immature Granulocytes: 1 %
Lymphocytes Relative: 34 %
Lymphs Abs: 1.6 K/uL (ref 0.7–4.0)
MCH: 32 pg (ref 26.0–34.0)
MCHC: 32.7 g/dL (ref 30.0–36.0)
MCV: 97.6 fL (ref 80.0–100.0)
Monocytes Absolute: 0.6 K/uL (ref 0.1–1.0)
Monocytes Relative: 14 %
Neutro Abs: 1.9 K/uL (ref 1.7–7.7)
Neutrophils Relative %: 42 %
Platelets: 225 K/uL (ref 150–400)
RBC: 4.63 MIL/uL (ref 4.22–5.81)
RDW: 13.2 % (ref 11.5–15.5)
WBC: 4.6 K/uL (ref 4.0–10.5)
nRBC: 0 % (ref 0.0–0.2)

## 2024-06-18 LAB — COMPREHENSIVE METABOLIC PANEL WITH GFR
ALT: 30 U/L (ref 0–44)
AST: 27 U/L (ref 15–41)
Albumin: 4.2 g/dL (ref 3.5–5.0)
Alkaline Phosphatase: 63 U/L (ref 38–126)
Anion gap: 10 (ref 5–15)
BUN: 25 mg/dL — ABNORMAL HIGH (ref 8–23)
CO2: 27 mmol/L (ref 22–32)
Calcium: 9.6 mg/dL (ref 8.9–10.3)
Chloride: 102 mmol/L (ref 98–111)
Creatinine, Ser: 1.36 mg/dL — ABNORMAL HIGH (ref 0.61–1.24)
GFR, Estimated: 59 mL/min — ABNORMAL LOW
Glucose, Bld: 90 mg/dL (ref 70–99)
Potassium: 4.5 mmol/L (ref 3.5–5.1)
Sodium: 140 mmol/L (ref 135–145)
Total Bilirubin: 0.2 mg/dL (ref 0.0–1.2)
Total Protein: 6.7 g/dL (ref 6.5–8.1)

## 2024-06-18 LAB — TSH: TSH: 0.773 u[IU]/mL (ref 0.350–4.500)

## 2024-06-18 NOTE — Plan of Care (Signed)
   Problem: Coping: Goal: Coping ability will improve Outcome: Progressing

## 2024-06-18 NOTE — Progress Notes (Signed)
" °   06/18/24 2200  Psych Admission Type (Psych Patients Only)  Admission Status Voluntary  Psychosocial Assessment  Patient Complaints None  Eye Contact Fair  Facial Expression Flat  Affect Depressed  Speech Logical/coherent  Interaction Assertive  Motor Activity Other (Comment) (WDL)  Appearance/Hygiene Unremarkable  Behavior Characteristics Appropriate to situation;Cooperative  Mood Depressed  Aggressive Behavior  Effect No apparent injury  Thought Process  Coherency WDL  Content WDL  Delusions None reported or observed  Perception WDL  Hallucination None reported or observed  Judgment WDL  Confusion WDL  Danger to Self  Current suicidal ideation? Denies  Agreement Not to Harm Self No  Description of Agreement Verbal none  Danger to Others  Danger to Others None reported or observed    "

## 2024-06-18 NOTE — Plan of Care (Signed)
  Problem: Education: Goal: Knowledge of the prescribed therapeutic regimen will improve Outcome: Progressing   Problem: Coping: Goal: Coping ability will improve Outcome: Progressing   Problem: Health Behavior/Discharge Planning: Goal: Compliance with therapeutic regimen will improve Outcome: Progressing   

## 2024-06-18 NOTE — Progress Notes (Signed)
" °   06/18/24 0845  Psych Admission Type (Psych Patients Only)  Admission Status Voluntary  Psychosocial Assessment  Patient Complaints None  Eye Contact Fair  Facial Expression Flat  Affect Depressed  Speech Logical/coherent  Interaction Assertive  Motor Activity Other (Comment) (WDL)  Appearance/Hygiene Unremarkable  Behavior Characteristics Cooperative;Guarded  Mood Depressed  Aggressive Behavior  Effect No apparent injury  Thought Process  Coherency WDL  Content WDL  Delusions None reported or observed  Perception WDL  Hallucination None reported or observed  Judgment WDL  Confusion WDL  Danger to Self  Current suicidal ideation? Denies  Agreement Not to Harm Self Yes  Description of Agreement verbal  Danger to Others  Danger to Others None reported or observed    "

## 2024-06-18 NOTE — Group Note (Signed)
 Date:  06/18/2024 Time:  5:22 AM  Group Topic/Focus:  Goals Group:   The focus of this group is to help patients establish daily goals to achieve during treatment and discuss how the patient can incorporate goal setting into their daily lives to aide in recovery.    Participation Level:  Active  Participation Quality:  Appropriate  Affect:  Appropriate  Cognitive:  Appropriate  Insight: Appropriate  Engagement in Group:  Engaged and Supportive  Modes of Intervention:  Discussion, Education, and Support  Additional Comments:    Kelyn Koskela L 06/18/2024, 5:22 AM

## 2024-06-18 NOTE — BH IP Treatment Plan (Signed)
 Interdisciplinary Treatment and Diagnostic Plan Update  06/18/2024 Time of Session: 3:21pm Richard Moon MRN: [id number]  Principal Diagnosis: <principal problem not specified>  Secondary Diagnoses: Active Problems:   MDD (major depressive disorder), recurrent, severe, with psychosis (HCC)   Current Medications:  Current Facility-Administered Medications  Medication Dose Route Frequency Provider Last Rate Last Admin   acetaminophen  (TYLENOL ) tablet 650 mg  650 mg Oral Q6H PRN Coleman, Carolyn H, NP   650 mg at 06/17/24 2114   alum & mag hydroxide-simeth (MAALOX/MYLANTA) 200-200-20 MG/5ML suspension 30 mL  30 mL Oral Q4H PRN Coleman, Carolyn H, NP       haloperidol  (HALDOL ) tablet 5 mg  5 mg Oral TID PRN Mardy Elveria DEL, NP       And   diphenhydrAMINE  (BENADRYL ) capsule 50 mg  50 mg Oral TID PRN Coleman, Carolyn H, NP       haloperidol  lactate (HALDOL ) injection 5 mg  5 mg Intramuscular TID PRN Mardy Elveria DEL, NP       And   diphenhydrAMINE  (BENADRYL ) injection 50 mg  50 mg Intramuscular TID PRN Mardy Elveria DEL, NP       And   LORazepam  (ATIVAN ) injection 2 mg  2 mg Intramuscular TID PRN Coleman, Carolyn H, NP       haloperidol  lactate (HALDOL ) injection 10 mg  10 mg Intramuscular TID PRN Mardy Elveria DEL, NP       And   diphenhydrAMINE  (BENADRYL ) injection 50 mg  50 mg Intramuscular TID PRN Mardy Elveria DEL, NP       And   LORazepam  (ATIVAN ) injection 2 mg  2 mg Intramuscular TID PRN Coleman, Carolyn H, NP       feeding supplement (ENSURE PLUS HIGH PROTEIN) liquid 237 mL  237 mL Oral TID BM Jadapalle, Sree, MD   237 mL at 06/18/24 1042   hydrOXYzine  (ATARAX ) tablet 50 mg  50 mg Oral Q6H PRN Coleman, Carolyn H, NP   50 mg at 06/17/24 0845   levothyroxine  (SYNTHROID ) tablet 25 mcg  25 mcg Oral Q0600 Coleman, Carolyn H, NP   25 mcg at 06/18/24 9350   magnesium  hydroxide (MILK OF MAGNESIA) suspension 30 mL  30 mL Oral Daily PRN Coleman, Carolyn H, NP       multivitamin  with minerals tablet 1 tablet  1 tablet Oral Daily Jadapalle, Sree, MD   1 tablet at 06/18/24 9242   nicotine  polacrilex (NICORETTE ) gum 2 mg  2 mg Oral PRN Jadapalle, Sree, MD   2 mg at 06/07/24 9171   sertraline  (ZOLOFT ) tablet 100 mg  100 mg Oral Daily May, Tanya, NP   100 mg at 06/18/24 9242   traZODone  (DESYREL ) tablet 25 mg  25 mg Oral QHS PRN Hunter, Crystal L, PA-C   25 mg at 06/17/24 2114   PTA Medications: Medications Prior to Admission  Medication Sig Dispense Refill Last Dose/Taking   sertraline  (ZOLOFT ) 50 MG tablet Take 1 tablet (50 mg total) by mouth daily. 30 tablet 0 06/06/2024   levothyroxine  (SYNTHROID ) 25 MCG tablet Take 1 tablet (25 mcg total) by mouth daily at 6 (six) AM. (Patient not taking: Reported on 06/06/2024) 30 tablet 0    traZODone  (DESYREL ) 50 MG tablet Take 1 tablet (50 mg total) by mouth at bedtime. (Patient not taking: Reported on 06/06/2024) 30 tablet 0     Patient Stressors: Financial difficulties   Loss of job last Friday   Occupational concerns   Substance abuse  Patient Strengths: Ability for insight  Physical Health   Treatment Modalities: Medication Management, Group therapy, Case management,  1 to 1 session with clinician, Psychoeducation, Recreational therapy.   Physician Treatment Plan for Primary Diagnosis: <principal problem not specified> Long Term Goal(s): Improvement in symptoms so as ready for discharge   Short Term Goals: Ability to identify changes in lifestyle to reduce recurrence of condition will improve Ability to disclose and discuss suicidal ideas Ability to demonstrate self-control will improve Ability to identify triggers associated with substance abuse/mental health issues will improve  Medication Management: Evaluate patient's response, side effects, and tolerance of medication regimen.  Therapeutic Interventions: 1 to 1 sessions, Unit Group sessions and Medication administration.  Evaluation of Outcomes:  Progressing  Physician Treatment Plan for Secondary Diagnosis: Active Problems:   MDD (major depressive disorder), recurrent, severe, with psychosis (HCC)  Long Term Goal(s): Improvement in symptoms so as ready for discharge   Short Term Goals: Ability to identify changes in lifestyle to reduce recurrence of condition will improve Ability to disclose and discuss suicidal ideas Ability to demonstrate self-control will improve Ability to identify triggers associated with substance abuse/mental health issues will improve     Medication Management: Evaluate patient's response, side effects, and tolerance of medication regimen.  Therapeutic Interventions: 1 to 1 sessions, Unit Group sessions and Medication administration.  Evaluation of Outcomes: Progressing   RN Treatment Plan for Primary Diagnosis: <principal problem not specified> Long Term Goal(s): Knowledge of disease and therapeutic regimen to maintain health will improve  Short Term Goals: Ability to remain free from injury will improve, Ability to verbalize frustration and anger appropriately will improve, Ability to demonstrate self-control, Ability to participate in decision making will improve, Ability to verbalize feelings will improve, Ability to disclose and discuss suicidal ideas, Ability to identify and develop effective coping behaviors will improve, and Compliance with prescribed medications will improve  Medication Management: RN will administer medications as ordered by provider, will assess and evaluate patient's response and provide education to patient for prescribed medication. RN will report any adverse and/or side effects to prescribing provider.  Therapeutic Interventions: 1 on 1 counseling sessions, Psychoeducation, Medication administration, Evaluate responses to treatment, Monitor vital signs and CBGs as ordered, Perform/monitor CIWA, COWS, AIMS and Fall Risk screenings as ordered, Perform wound care treatments as  ordered.  Evaluation of Outcomes: Progressing   LCSW Treatment Plan for Primary Diagnosis: <principal problem not specified> Long Term Goal(s): Safe transition to appropriate next level of care at discharge, Engage patient in therapeutic group addressing interpersonal concerns.  Short Term Goals: Engage patient in aftercare planning with referrals and resources, Increase social support, Increase ability to appropriately verbalize feelings, Increase emotional regulation, Facilitate acceptance of mental health diagnosis and concerns, Facilitate patient progression through stages of change regarding substance use diagnoses and concerns, Identify triggers associated with mental health/substance abuse issues, and Increase skills for wellness and recovery  Therapeutic Interventions: Assess for all discharge needs, 1 to 1 time with Social worker, Explore available resources and support systems, Assess for adequacy in community support network, Educate family and significant other(s) on suicide prevention, Complete Psychosocial Assessment, Interpersonal group therapy.  Evaluation of Outcomes: Progressing   Progress in Treatment: Attending groups:yes and no Participating in groups: No. Taking medication as prescribed: Yes. Toleration medication: Yes. Family/Significant other contact made: no will if patient agrees to release of information Patient understands diagnosis: Yes. Discussing patient identified problems/goals with staff: Yes. Medical problems stabilized or resolved: Yes. Denies suicidal/homicidal ideation: Yes.  Issues/concerns per patient self-inventory: No. Other: none  New problem(s) identified: No, Describe:  none  New Short Term/Long Term Goal(s): medication management for mood stabilization; elimination of SI thoughts; development of comprehensive mental wellness/sobriety plan.    Patient Goals:   I have these racing thoughts, not wanting to live thoughts, and really want to  get into treatment for substance use.       Discharge Plan or Barriers:  CSW will assist pt with development of an appropriate aftercare/discharge plan.     Reason for Continuation of Hospitalization: Depression Medication stabilization Suicidal ideation  Estimated Length of Stay:1-7 days  Last 3 [place] Suicide Severity Risk Score: Flowsheet Row Admission (Current) from 06/06/2024 in [facility]PAT[facility] BE[facility]ICINE ED from 06/05/2024 in [facility] Department [facility]Dis[facility]  C-SSRS RISK CATEGORY High Risk High Risk High Risk    Last PHQ 2/9 Scores:    06/08/2023    8:00 AM 06/07/2023    2:09 PM 06/05/2023    4:55 AM  Depression screen PHQ 2/9  Decreased Interest 2 2 2   Down, Depressed, Hopeless 2 2 2   PHQ - 2 Score 4 4 4   Altered sleeping 1 1 1   Tired, decreased energy 1 1 1   Change in appetite 1 1 1   Feeling bad or failure about yourself  3 3 3   Trouble concentrating 1 1 1   Moving slowly or fidgety/restless 0 0 0  Suicidal thoughts 1 1 1   PHQ-9 Score 12  12  12    Difficult doing work/chores   Very difficult     Data saved with a previous flowsheet row definition    Scribe for Treatment Team: Pamila Katrina HUGHS 06/18/2024 3:21 PM

## 2024-06-18 NOTE — Progress Notes (Signed)
 [facility] MD Progress Note  06/18/2024 12:35 PM [place]  MRN:  [id number]  From initial presentation:   Richard Moon is a 63 y.o. year-old male presents to the ED with chief complaint of suicidal and homicidal thoughts.  States that his medications have not been working and he feels increasingly irritable.  States that he is now having thoughts of wanting to hurt himself and hurt others.  States that he has been using cocaine, and then uses alcohol to try and go to sleep.  He denies any recent illnesses.  Denies fever, cough, cold symptoms.  Subjective:  Chart reviewed, case discussed in multidisciplinary meeting, patient seen during rounds.   12/28: On interview today, patient is found resting in bed.  He is calm and cooperative, alert and oriented.  He rates depression as 7 out of 10 and anxiety as 7 out of 10 today, stating these symptoms are primarily circumstantial due to patient being unsure where he will go upon hospital discharge.  He denies SI/HI/plan and denies hallucinations.  He reports good sleep and appetite.  He reports persistent lightheadedness today, denies spinning sensation.  Orthostatic vital signs are within normal limits.  CBC and CMP repeated today.  EKG repeated today showed normal sinus rhythm.  Will consult hospitalist to evaluate lightheadedness.   12/27: On interview today, patient is found resting in bed.  Patient endorses situational depression and anxiety due to uncertainty about where he will go upon hospital discharge.  He rates current depression as 7 out of 10 and anxiety is 8 out of 10 today.  Patient is tolerating medication regimen well without adverse effects and declines Zoloft  dose increase at this time, feeling that current doses been overall effective.  He denies SI/HI/plan and denies hallucinations.  Will continue to monitor at this time due to need for safe discharge planning.  Patient has completed phone screening with [facility]  [place].  06/16/2024: On interview today, patient is noted to be calm and cooperative, alert and oriented.  He reports improving depression rating it as low today.  He endorses situational anxiety, rating anxiety as 8 out of 10 at time of interview.  He reports good sleep and appetite.  He denies SI/HI/plan and denies hallucinations.  He is tolerating medication regimen well without adverse effects.  Will continue to monitor at this time due to need for safe discharge planning.  Per CSW, patient has been referred to [facility] [place].   06/15/24: Patient is noted to be participating in groups.  He reports ongoing improvement in his depression and anxiety.  He denies SI/HI/plan and denies hallucinations.  He is taking his medications and tolerating them well.  He is agreeable to outpatient follow-up and ongoing psychotherapy.  06/14/24: Patient is noted to be resting in bed.  He reports improvement in his depression and anxiety.  He remains frustrated about unable to find a place to transition out after discharge.  He informed the provider that he is confused about the reason why he got declined from [facility].  He denies SI/HI/plan and denies hallucinations.  He is not displaying any withdrawal symptoms from alcohol or drugs.  He has fair appetite and sleep and participating   06/13/24: Patient is noted to be sitting in his room reviewing the list of placements that he needs to call.  Patient reports that his mood is improving on the current medication and denies feeling hopeless or helpless.  He denies SI/HI/plan and denies hallucinations.  Per nursing  patient is taking medications with no reported side effects.  06/12/2024: Patient is noted to be resting in bed.  He offers no complaints.  He reports ongoing depression and anxiety due to his homeless situation and not being accepted to inpatient rehab programs.  Patient reports working with [facility] before and is confused about their denial of  acceptance.  He rates his depression as 7 out of 10, 10 being the worst and anxiety as 8 out of 10.  He reports having hyperstartle with loud sounds on the unit.  He denies SI/HI/plan and denies hallucination.  He is taking his medications with no reported side effect 12/21: Patient found resting in his room. He reports he is doing alright. He denies SI, HI, and VH. He endorses AH of people crying. He expresses that he has come out of his room for food and groups. Endorses attending to ADLs. He reports that his mind is still racing while he is awake.   Denies concerns/complaints. He continues to remain interested in rehab/housing resources.   12/20: Patient found resting in his room. He reports his mood as down. He denies SI, HI, and AVH. He reports he is tired. Endorses sleeping adequately and that he sleeps a lot. Endorses eating adequately as well. Patient reports wanting to engage with social work for housing and rehabilitation.   12/19: Patient is found resting in his room. He engages with clinical research associate and is cooperative. He reports that he is doing okay. He is experiencing anxiety at times rating it a 9/10. He rates his depression a 7/10. He endorses using cocaine about three days ago and uses daily. He endorses also using THC occasionally.   Denies SI, HI, and VH. He initially endorses AH, however it does not appear to be due to primary thought disorder. He described the AH as him having a conversation with myself and that it is just one voice. He endorses also hearing bells and shit sometimes.   Past Psychiatric History: see h&P Family History: History reviewed. No pertinent family history. Social History:  Social History   Substance and Sexual Activity  Alcohol Use Yes   Comment: Occasional alcohol use     Social History   Substance and Sexual Activity  Drug Use Yes   Types: Marijuana, Cocaine   Comment: daily cocaine use (reported $200-300/ daily); occasional cannabis  use    Social History   Socioeconomic History   Marital status: Single    Spouse name: Not on file   Number of children: Not on file   Years of education: Not on file   Highest education level: Not on file  Occupational History   Not on file  Tobacco Use   Smoking status: Some Days    Current packs/day: 0.25    Average packs/day: 0.3 packs/day for 20.0 years (5.0 ttl pk-yrs)    Types: Cigarettes   Smokeless tobacco: Never  Vaping Use   Vaping status: Former  Substance and Sexual Activity   Alcohol use: Yes    Comment: Occasional alcohol use   Drug use: Yes    Types: Marijuana, Cocaine    Comment: daily cocaine use (reported $200-300/ daily); occasional cannabis use   Sexual activity: Yes    Birth control/protection: Condom  Other Topics Concern   Not on file  Social History Narrative   Not on file   Social Drivers of Health   Tobacco Use: High Risk (06/06/2024)   Patient History    Smoking Tobacco Use: Some Days  Smokeless Tobacco Use: Never    Passive Exposure: Not on file  Financial Resource Strain: Not on file  Food Insecurity: Food Insecurity Present (06/06/2024)   Epic    Worried About Programme Researcher, Broadcasting/film/video in the Last Year: Sometimes true    Ran Out of Food in the Last Year: Sometimes true  Transportation Needs: Unmet Transportation Needs (06/06/2024)   Epic    Lack of Transportation (Medical): Yes    Lack of Transportation (Non-Medical): Yes  Physical Activity: Not on file  Stress: Not on file  Social Connections: Not on file  Depression (PHQ2-9): High Risk (06/08/2023)   Depression (PHQ2-9)    PHQ-2 Score: 12  Alcohol Screen: Medium Risk (06/06/2024)   Alcohol Screen    Last Alcohol Screening Score (AUDIT): 14  Housing: High Risk (06/06/2024)   Epic    Unable to Pay for Housing in the Last Year: Yes    Number of Times Moved in the Last Year: 4    Homeless in the Last Year: Yes  Utilities: Not At Risk (06/06/2024)   Epic    Threatened with loss  of utilities: No  Health Literacy: Not on file   Past Medical History:  Past Medical History:  Diagnosis Date   Cocaine abuse (HCC)    Homeless    Hypothyroidism 03/04/2024   Hypovitaminosis D 03/04/2024   Major depressive disorder, recurrent severe without psychotic features (HCC)    Prediabetes 03/04/2024   Substance induced mood disorder (HCC)     Past Surgical History:  Procedure Laterality Date   HERNIA REPAIR      Current Medications: Current Facility-Administered Medications  Medication Dose Route Frequency Provider Last Rate Last Admin   acetaminophen  (TYLENOL ) tablet 650 mg  650 mg Oral Q6H PRN Coleman, Carolyn H, NP   650 mg at 06/17/24 2114   alum & mag hydroxide-simeth (MAALOX/MYLANTA) 200-200-20 MG/5ML suspension 30 mL  30 mL Oral Q4H PRN Coleman, Carolyn H, NP       haloperidol  (HALDOL ) tablet 5 mg  5 mg Oral TID PRN Mardy Elveria DEL, NP       And   diphenhydrAMINE  (BENADRYL ) capsule 50 mg  50 mg Oral TID PRN Mardy Elveria DEL, NP       haloperidol  lactate (HALDOL ) injection 5 mg  5 mg Intramuscular TID PRN Mardy Elveria DEL, NP       And   diphenhydrAMINE  (BENADRYL ) injection 50 mg  50 mg Intramuscular TID PRN Mardy Elveria DEL, NP       And   LORazepam  (ATIVAN ) injection 2 mg  2 mg Intramuscular TID PRN Mardy Elveria DEL, NP       haloperidol  lactate (HALDOL ) injection 10 mg  10 mg Intramuscular TID PRN Mardy Elveria DEL, NP       And   diphenhydrAMINE  (BENADRYL ) injection 50 mg  50 mg Intramuscular TID PRN Mardy Elveria DEL, NP       And   LORazepam  (ATIVAN ) injection 2 mg  2 mg Intramuscular TID PRN Mardy Elveria DEL, NP       feeding supplement (ENSURE PLUS HIGH PROTEIN) liquid 237 mL  237 mL Oral TID BM Jadapalle, Sree, MD   237 mL at 06/18/24 1042   hydrOXYzine  (ATARAX ) tablet 50 mg  50 mg Oral Q6H PRN Coleman, Carolyn H, NP   50 mg at 06/17/24 0845   levothyroxine  (SYNTHROID ) tablet 25 mcg  25 mcg Oral Q0600 Mardy Elveria DEL, NP   25 mcg at  06/18/24  9350   magnesium  hydroxide (MILK OF MAGNESIA) suspension 30 mL  30 mL Oral Daily PRN Coleman, Carolyn H, NP       multivitamin with minerals tablet 1 tablet  1 tablet Oral Daily Jadapalle, Sree, MD   1 tablet at 06/18/24 0757   nicotine  polacrilex (NICORETTE ) gum 2 mg  2 mg Oral PRN Jadapalle, Sree, MD   2 mg at 06/07/24 9171   sertraline  (ZOLOFT ) tablet 100 mg  100 mg Oral Daily May, Tanya, NP   100 mg at 06/18/24 9242   traZODone  (DESYREL ) tablet 25 mg  25 mg Oral QHS PRN Duquan Gillooly L, PA-C   25 mg at 06/17/24 2114    Lab Results:  No results found for this or any previous visit (from the past 48 hours).   Blood Alcohol level:  Lab Results  Component Value Date   ETH <15 06/05/2024   ETH 43 (H) 02/29/2024    Metabolic Disorder Labs: Lab Results  Component Value Date   HGBA1C 5.6 06/09/2024   MPG 114.02 06/09/2024   MPG 122.63 03/02/2024   No results found for: PROLACTIN Lab Results  Component Value Date   CHOL 178 06/09/2024   TRIG 92 06/09/2024   HDL 81 06/09/2024   CHOLHDL 2.2 06/09/2024   VLDL 18 06/09/2024   LDLCALC 78 06/09/2024   LDLCALC 100 (H) 03/02/2024    Physical Findings: AIMS:  , ,  ,  ,    CIWA:  CIWA-Ar Total: 0 COWS:      Psychiatric Specialty Exam:  Presentation  General Appearance:  Appropriate for Environment; Casual  Eye Contact: Fair  Speech: Clear and Coherent; Normal Rate  Speech Volume: Normal    Mood and Affect  Mood: Anxious, depressed  Affect: Appropriate   Thought Process  Thought Processes: Coherent; Linear  Orientation:Full (Time, Place and Person)  Thought Content:Logical  Hallucinations: Denies Ideas of Reference:None  Suicidal Thoughts: Denies Homicidal Thoughts: Denies    Sensorium  Memory: Immediate Fair; Recent Fair  Judgment: Fair  Insight: Fair   Art Therapist  Concentration: Fair  Attention Span: Fair  Recall: [employer] of  Knowledge: Fair  Language: Fair   Psychomotor Activity  Psychomotor Activity: Normal   Musculoskeletal: Strength & Muscle Tone: within normal limits Gait & Station: normal Assets  Assets: Manufacturing Systems Engineer; Desire for Improvement    Physical Exam: Physical Exam Vitals and nursing note reviewed.  Constitutional:      Appearance: Normal appearance.  Pulmonary:     Effort: Pulmonary effort is normal.  Neurological:     Mental Status: He is alert and oriented to person, place, and time.  Psychiatric:        Mood and Affect: Mood normal.        Behavior: Behavior normal.    Review of Systems  Respiratory:  Negative for shortness of breath.   Cardiovascular:  Negative for chest pain.  Gastrointestinal:  Negative for diarrhea, nausea and vomiting.  Psychiatric/Behavioral:  Positive for hallucinations and substance abuse. Negative for depression and suicidal ideas. The patient is nervous/anxious.   All other systems reviewed and are negative.  Blood pressure 97/71, pulse 72, temperature 98.1 F (36.7 C), temperature source Oral, resp. rate 17, height 5' 9 (1.753 m), weight 64.4 kg, SpO2 98%. Body mass index is 20.97 kg/m.  Diagnosis: Active Problems:   MDD (major depressive disorder), recurrent, severe, with psychosis (HCC)   Treatment Plan Summary:   Safety and Monitoring:             --  Voluntary admission to inpatient psychiatric unit for safety, stabilization and treatment             -- Daily contact with patient to assess and evaluate symptoms and progress in treatment             -- Patient's case to be discussed in multi-disciplinary team meeting             -- Observation Level: q15 minute checks             -- Vital signs:  q12 hours             -- Precautions: suicide, elopement, and assault   2. Psychiatric Diagnoses and Treatment:   - Zoloft  100 mg once daily          - Trazodone  25 mg at bedtime as needed for sleep - dose decreased per patient  request due to feeling 50 mg was too much    -- The risks/benefits/side-effects/alternatives to this medication were discussed in detail with the patient and time was given for questions. The patient consents to medication trial.                -- Metabolic profile and EKG monitoring obtained while on an atypical antipsychotic (BMI: Lipid Panel: HbgA1c: QTc:)              -- Encouraged patient to participate in unit milieu and in scheduled group therapies                            3. Medical Issues Being Addressed:  No acute concerns.    4. Discharge Planning:             -- Likely Monday or Tuesday  -- Social work and case management to assist with discharge planning and identification of hospital follow-up needs prior to discharge  -- Estimated LOS: 10-12 days Case discussed with attending physician, Dr. Hellen, who is in agreement with current plan. Camelia LITTIE Lukes, PA-C 06/18/2024, 12:35 PM

## 2024-06-18 NOTE — Consult Note (Addendum)
 Initial Consultation Note   Patient: Richard Moon FMW:[id number] DOB: [birth year] PCP: Patient, No Pcp Per DOA: 06/06/2024 DOS: the patient was seen and examined on 06/18/2024 Primary service: Donnelly Mellow, MD  Referring physician: Dr. Donnelly Reason for consult: Headache, light headed, feeling tired.  Assessment/Plan:  Lightheadedness - Normal physical exam nonfocal.  Communicated with psychiatry team, who checked orthostatic vital signs several times this morning and found negative orthostatic. - Symptoms appear to be more significant in the morning, associated with bilateral dull-like headache but denied any hearing changes or vision changes.  Given that the patient also has features of increasing daytime napping and fatigue in the morning, clinically suspect undiagnosed sleep apnea. - Check nocturnal pulse ox - He also has a right-sided carotid bruits, will order carotid doppler. -No heat murmur appreciated, outpatient Echo -Other Ddx, no orthostatic hypotension, and his BP appears to be at baseline compared to his office record in March this year. Alson noticed that patient has Hx of cocaine abuse and his current symptoms of headache/feeling dizzy might be also related to withdrawal from cocaine. No focal neurological deficit, will not pursue brain image study at this time.  Hypothyroid -Patient claims that he was just recently started on synthroid  less than 3 months ago. Will check TSH level. -Continue synthroid  25 mcg daily    TRH will sign off at present, please call us  again when needed. Or if there is any significant finding on the carotid doppler.  HPI: Richard Moon is a 63 y.o. male with past medical history of MDD, hypothyroidism on Synthroid , cocaine abuse, Patient reported that for the last 3 days he has been having depression.  admitted to inpatient psychiatry unit to treat headache bilateral dull-like every morning when he woke up and had to take Tylenol  for the  last 3 mornings and he has been feeling more tired and sleepy especially in the morning and had to take several naps but still feeling tired.  Meantime he has been feeling lightheadedness episodes, when standing up and walking.  Denies any vertigo no vision or hearing changes.  Review of Systems: As mentioned in the history of present illness. All other systems reviewed and are negative. Past Medical History:  Diagnosis Date   Cocaine abuse (HCC)    Homeless    Hypothyroidism 03/04/2024   Hypovitaminosis D 03/04/2024   Major depressive disorder, recurrent severe without psychotic features (HCC)    Prediabetes 03/04/2024   Substance induced mood disorder (HCC)    Past Surgical History:  Procedure Laterality Date   HERNIA REPAIR     Social History:  reports that he has been smoking cigarettes. He has a 5 pack-year smoking history. He has never used smokeless tobacco. He reports current alcohol use. He reports current drug use. Drugs: Marijuana and Cocaine.  Allergies[1]  History reviewed. No pertinent family history.  Prior to Admission medications  Medication Sig Start Date End Date Taking? Authorizing Provider  sertraline  (ZOLOFT ) 50 MG tablet Take 1 tablet (50 mg total) by mouth daily. 04/25/24  Yes Kennyth Domino, FNP  levothyroxine  (SYNTHROID ) 25 MCG tablet Take 1 tablet (25 mcg total) by mouth daily at 6 (six) AM. Patient not taking: Reported on 06/06/2024 03/11/24   Prentis Kitchens A, DO  traZODone  (DESYREL ) 50 MG tablet Take 1 tablet (50 mg total) by mouth at bedtime. Patient not taking: Reported on 06/06/2024 03/10/24   Prentis Kitchens LABOR, DO    Physical Exam: Vitals:   06/18/24 9092 06/18/24 1604 06/18/24 1605 06/18/24  1606  BP: 97/71 112/75 117/86 116/83  Pulse: 72 65 64 67  Resp:  18 17 19   Temp:  98.6 F (37 C) 98.6 F (37 C) 98.6 F (37 C)  TempSrc:  Oral    SpO2: 98% 100% 100% 98%  Weight:      Height:       Eyes: PERRL, lids and conjunctivae normal ENMT: Mucous  membranes are moist. Posterior pharynx clear of any exudate or lesions.Normal dentition.  Neck: normal, supple, no masses, no thyromegaly. Bruits heard on the right carotid area Respiratory: clear to auscultation bilaterally, no wheezing, no crackles. Normal respiratory effort. No accessory muscle use.  Cardiovascular: Regular rate and rhythm, no murmurs / rubs / gallops. No extremity edema. 2+ pedal pulses. No carotid bruits.  Abdomen: no tenderness, no masses palpated. No hepatosplenomegaly. Bowel sounds positive.  Musculoskeletal: no clubbing / cyanosis. No joint deformity upper and lower extremities. Good ROM, no contractures. Normal muscle tone.  Skin: no rashes, lesions, ulcers. No induration Neurologic: CN 2-12 grossly intact. Sensation intact, DTR normal.  Muscle strength 5/5 on both sides Psychiatric: Normal judgment and insight. Alert and oriented x 3. Normal mood.    Data Reviewed:  Office record including TSH study and recent office visit reviewed.  Family Communication: None Primary team communication: Psy Thank you very much for involving us  in the care of your patient.  Author: Cort ONEIDA Mana, MD 06/18/2024 5:13 PM  For on call review [email or web address].      [1] No Known Allergies

## 2024-06-19 ENCOUNTER — Other Ambulatory Visit: Payer: Self-pay

## 2024-06-19 MED ORDER — LEVOTHYROXINE SODIUM 25 MCG PO TABS
25.0000 ug | ORAL_TABLET | Freq: Every day | ORAL | 0 refills | Status: AC
  Filled 2024-06-19: qty 30, 30d supply, fill #0

## 2024-06-19 MED ORDER — QUETIAPINE FUMARATE 25 MG PO TABS
25.0000 mg | ORAL_TABLET | Freq: Every day | ORAL | Status: DC
  Administered 2024-06-19: 25 mg via ORAL
  Filled 2024-06-19: qty 1

## 2024-06-19 MED ORDER — ADULT MULTIVITAMIN W/MINERALS CH
1.0000 | ORAL_TABLET | Freq: Every day | ORAL | 0 refills | Status: AC
  Filled 2024-06-19: qty 30, 30d supply, fill #0

## 2024-06-19 MED ORDER — SERTRALINE HCL 100 MG PO TABS
100.0000 mg | ORAL_TABLET | Freq: Every day | ORAL | 0 refills | Status: AC
  Filled 2024-06-19: qty 30, 30d supply, fill #0

## 2024-06-19 NOTE — Plan of Care (Signed)
  Problem: Education: Goal: Utilization of techniques to improve thought processes will improve Outcome: Progressing Goal: Knowledge of the prescribed therapeutic regimen will improve Outcome: Progressing   Problem: Activity: Goal: Interest or engagement in leisure activities will improve Outcome: Progressing Goal: Imbalance in normal sleep/wake cycle will improve Outcome: Progressing   Problem: Coping: Goal: Coping ability will improve Outcome: Progressing Goal: Will verbalize feelings Outcome: Progressing   Problem: Health Behavior/Discharge Planning: Goal: Ability to make decisions will improve Outcome: Progressing Goal: Compliance with therapeutic regimen will improve Outcome: Progressing   

## 2024-06-19 NOTE — Group Note (Signed)
 Recreation Therapy Group Note   Group Topic:Problem Solving  Group Date: 06/19/2024 Start Time: 1010 End Time: 1050 Facilitators: Celestia Jeoffrey BRAVO, LRT, CTRS Location: Craft Room  Group Description: Life Boat. Patients were given the scenario that they are on a boat that is about to become shipwrecked, leaving them stranded on an island. They are asked to make a list of 15 different items that they want to take with them when they are stranded on the [place]. Patients are asked to rank their items from most important to least important, #1 being the most important and #15 being the least. Patients will work individually for the first round to come up with 15 items and then pair up with a peer(s) to condense their list and come up with one list of 15 items between the two of them. Patients or LRT will read aloud the 15 different items to the group after each round. LRT facilitated post-activity processing to discuss how this activity can be used in daily life post discharge.   Goal Area(s) Addressed:  Patient will identify priorities, wants and needs. Patient will communicate with LRT and peers. Patient will work collectively as a administrator, civil service. Patient will work on product manager.    Affect/Mood: Appropriate   Participation Level: Active and Engaged   Participation Quality: Independent   Behavior: Calm and Cooperative   Speech/Thought Process: Coherent   Insight: Fair   Judgement: Fair    Modes of Intervention: Exploration, Guided Discussion, and Problem-solving   Patient Response to Interventions:  Receptive   Education Outcome:  Acknowledges education   Clinical Observations/Individualized Feedback: Richard Moon was active in their participation of session activities and group discussion. Pt identified food, bed, first aid kit, tools, books and generator as some of the items he would bring with him.    Plan: Continue to engage patient in RT group sessions  2-3x/week.   Jeoffrey BRAVO Celestia, LRT, CTRS 06/19/2024 11:06 AM

## 2024-06-19 NOTE — Group Note (Signed)
 Recreation Therapy Group Note   Group Topic:Coping Skills  Group Date: 06/19/2024 Start Time: 1530 End Time: 1605 Facilitators: Celestia Jeoffrey BRAVO, LRT, CTRS Location: Dayroom  Group Description: Meditation. LRT and patients discussed what they know about meditation and mindfulness. LRT played a Deep Breathing Meditation exercise script for patients to follow along to. LRT and patients discussed how meditation and deep breathing can be used as a coping skill post--discharge to help manage symptoms of stress.   Goal Area(s) Addressed: Patient will practice using relaxation technique. Patient will identify a new coping skill.  Patient will follow multistep directions to reduce anxiety and stress.   Affect/Mood: Appropriate   Participation Level: Active and Engaged   Participation Quality: Independent   Behavior: Calm and Cooperative   Speech/Thought Process: Coherent   Insight: Good   Judgement: Good   Modes of Intervention: Activity, Education, and Exploration   Patient Response to Interventions:  Attentive, Engaged, Interested , and Receptive   Education Outcome:  Acknowledges education   Clinical Observations/Individualized Feedback: Jesaiah was active in their participation of session activities and group discussion. Pt completed all exercises as prompted.    Plan: Continue to engage patient in RT group sessions 2-3x/week.   [address] LRT, CTRS 06/19/2024 5:10 PM

## 2024-06-19 NOTE — [facility] Counselor (Addendum)
 ADDENDUM  CSW received call from [facility] [place]. CSW was informed that pt has been accepted and they will be providing transportation tomorrow. Kylie stated that they will call CSW regarding transportation and what time they should be arriving. CSW agreed. No other concerns expressed. Contact ended without incident.   [name]. Chaim, MSW, LCSW, LCAS 06/19/2024 10:53 AM  CSW phoned [facility] [place] [phone]) and spoke with Kylie. She requested progress notes from the weekend be sent over. She stated that everything sounds good but she wanted the weekends progress notes on record. She stated that she will review these and then give CSW a call back. Kylie inquired if pt was ready for discharge. She was informed that pt is ready for discharge but would need transportation to get there. CSW inquired if they would be able to provide transportation. Kylie stated that she would inquire regarding transportation as well. She stated that when she calls back she should be able to answer this as well. No other concerns expressed. Contact ended without incident.   CSW faxed over weekend progress notes to [facility] [phone]).  [name]. Chaim, MSW, LCSW, LCAS 06/19/2024 10:04 AM

## 2024-06-19 NOTE — Plan of Care (Signed)

## 2024-06-19 NOTE — Progress Notes (Signed)
 [facility] MD Progress Note  06/19/2024 7:44 PM Richard Moon  MRN:  [id number]  From initial presentation:   Richard Moon is a 63 y.o. year-old male presents to the ED with chief complaint of suicidal and homicidal thoughts.  States that his medications have not been working and he feels increasingly irritable.  States that he is now having thoughts of wanting to hurt himself and hurt others.  States that he has been using cocaine, and then uses alcohol to try and go to sleep.  He denies any recent illnesses.  Denies fever, cough, cold symptoms.  Subjective:  Chart reviewed, case discussed in multidisciplinary meeting, patient seen during rounds.  December 29 patient was seen in the dayroom he reports to me that he has been homeless for some time, continues to report headaches and vague symptoms he reports that he does not sleep well and trazodone  is giving headache.  Denies any current suicidal and homicidal thoughts 12/28: On interview today, patient is found resting in bed.  He is calm and cooperative, alert and oriented.  He rates depression as 7 out of 10 and anxiety as 7 out of 10 today, stating these symptoms are primarily circumstantial due to patient being unsure where he will go upon hospital discharge.  He denies SI/HI/plan and denies hallucinations.  He reports good sleep and appetite.  He reports persistent lightheadedness today, denies spinning sensation.  Orthostatic vital signs are within normal limits.  CBC and CMP repeated today.  EKG repeated today showed normal sinus rhythm.  Will consult hospitalist to evaluate lightheadedness.   12/27: On interview today, patient is found resting in bed.  Patient endorses situational depression and anxiety due to uncertainty about where he will go upon hospital discharge.  He rates current depression as 7 out of 10 and anxiety is 8 out of 10 today.  Patient is tolerating medication regimen well without adverse effects and declines Zoloft  dose  increase at this time, feeling that current doses been overall effective.  He denies SI/HI/plan and denies hallucinations.  Will continue to monitor at this time due to need for safe discharge planning.  Patient has completed phone screening with [facility].  06/16/2024: On interview today, patient is noted to be calm and cooperative, alert and oriented.  He reports improving depression rating it as low today.  He endorses situational anxiety, rating anxiety as 8 out of 10 at time of interview.  He reports good sleep and appetite.  He denies SI/HI/plan and denies hallucinations.  He is tolerating medication regimen well without adverse effects.  Will continue to monitor at this time due to need for safe discharge planning.  Per CSW, patient has been referred to [facility].   06/15/24: Patient is noted to be participating in groups.  He reports ongoing improvement in his depression and anxiety.  He denies SI/HI/plan and denies hallucinations.  He is taking his medications and tolerating them well.  He is agreeable to outpatient follow-up and ongoing psychotherapy.  06/14/24: Patient is noted to be resting in bed.  He reports improvement in his depression and anxiety.  He remains frustrated about unable to find a place to transition out after discharge.  He informed the provider that he is confused about the reason why he got declined from [facility].  He denies SI/HI/plan and denies hallucinations.  He is not displaying any withdrawal symptoms from alcohol or drugs.  He has fair appetite and sleep and participating   06/13/24:  Patient is noted to be sitting in his room reviewing the list of placements that he needs to call.  Patient reports that his mood is improving on the current medication and denies feeling hopeless or helpless.  He denies SI/HI/plan and denies hallucinations.  Per nursing patient is taking medications with no reported side effects.  06/12/2024: Patient is noted to  be resting in bed.  He offers no complaints.  He reports ongoing depression and anxiety due to his homeless situation and not being accepted to inpatient rehab programs.  Patient reports working with [facility] before and is confused about their denial of acceptance.  He rates his depression as 7 out of 10, 10 being the worst and anxiety as 8 out of 10.  He reports having hyperstartle with loud sounds on the unit.  He denies SI/HI/plan and denies hallucination.  He is taking his medications with no reported side effect 12/21: Patient found resting in his room. He reports he is doing alright. He denies SI, HI, and VH. He endorses AH of people crying. He expresses that he has come out of his room for food and groups. Endorses attending to ADLs. He reports that his mind is still racing while he is awake.   Denies concerns/complaints. He continues to remain interested in rehab/housing resources.   12/20: Patient found resting in his room. He reports his mood as down. He denies SI, HI, and AVH. He reports he is tired. Endorses sleeping adequately and that he sleeps a lot. Endorses eating adequately as well. Patient reports wanting to engage with social work for housing and rehabilitation.   12/19: Patient is found resting in his room. He engages with clinical research associate and is cooperative. He reports that he is doing okay. He is experiencing anxiety at times rating it a 9/10. He rates his depression a 7/10. He endorses using cocaine about three days ago and uses daily. He endorses also using THC occasionally.   Denies SI, HI, and VH. He initially endorses AH, however it does not appear to be due to primary thought disorder. He described the AH as him having a conversation with myself and that it is just one voice. He endorses also hearing bells and shit sometimes.   Past Psychiatric History: see h&P Family History: History reviewed. No pertinent family history. Social History:  Social History    Substance and Sexual Activity  Alcohol Use Yes   Comment: Occasional alcohol use     Social History   Substance and Sexual Activity  Drug Use Yes   Types: Marijuana, Cocaine   Comment: daily cocaine use (reported $200-300/ daily); occasional cannabis use    Social History   Socioeconomic History   Marital status: Single    Spouse name: Not on file   Number of children: Not on file   Years of education: Not on file   Highest education level: Not on file  Occupational History   Not on file  Tobacco Use   Smoking status: Some Days    Current packs/day: 0.25    Average packs/day: 0.3 packs/day for 20.0 years (5.0 ttl pk-yrs)    Types: Cigarettes   Smokeless tobacco: Never  Vaping Use   Vaping status: Former  Substance and Sexual Activity   Alcohol use: Yes    Comment: Occasional alcohol use   Drug use: Yes    Types: Marijuana, Cocaine    Comment: daily cocaine use (reported $200-300/ daily); occasional cannabis use   Sexual activity: Yes  Birth control/protection: Condom  Other Topics Concern   Not on file  Social History Narrative   Not on file   Social Drivers of Health   Tobacco Use: High Risk (06/06/2024)   Patient History    Smoking Tobacco Use: Some Days    Smokeless Tobacco Use: Never    Passive Exposure: Not on file  Financial Resource Strain: Not on file  Food Insecurity: Food Insecurity Present (06/06/2024)   Epic    Worried About Programme Researcher, Broadcasting/film/video in the Last Year: Sometimes true    Ran Out of Food in the Last Year: Sometimes true  Transportation Needs: Unmet Transportation Needs (06/06/2024)   Epic    Lack of Transportation (Medical): Yes    Lack of Transportation (Non-Medical): Yes  Physical Activity: Not on file  Stress: Not on file  Social Connections: Not on file  Depression (PHQ2-9): High Risk (06/08/2023)   Depression (PHQ2-9)    PHQ-2 Score: 12  Alcohol Screen: Medium Risk (06/06/2024)   Alcohol Screen    Last Alcohol Screening  Score (AUDIT): 14  Housing: High Risk (06/06/2024)   Epic    Unable to Pay for Housing in the Last Year: Yes    Number of Times Moved in the Last Year: 4    Homeless in the Last Year: Yes  Utilities: Not At Risk (06/06/2024)   Epic    Threatened with loss of utilities: No  Health Literacy: Not on file   Past Medical History:  Past Medical History:  Diagnosis Date   Cocaine abuse (HCC)    Homeless    Hypothyroidism 03/04/2024   Hypovitaminosis D 03/04/2024   Major depressive disorder, recurrent severe without psychotic features (HCC)    Prediabetes 03/04/2024   Substance induced mood disorder (HCC)     Past Surgical History:  Procedure Laterality Date   HERNIA REPAIR      Current Medications: Current Facility-Administered Medications  Medication Dose Route Frequency Provider Last Rate Last Admin   acetaminophen  (TYLENOL ) tablet 650 mg  650 mg Oral Q6H PRN Coleman, Carolyn H, NP   650 mg at 06/19/24 1118   alum & mag hydroxide-simeth (MAALOX/MYLANTA) 200-200-20 MG/5ML suspension 30 mL  30 mL Oral Q4H PRN Coleman, Carolyn H, NP       haloperidol  (HALDOL ) tablet 5 mg  5 mg Oral TID PRN Mardy Elveria DEL, NP       And   diphenhydrAMINE  (BENADRYL ) capsule 50 mg  50 mg Oral TID PRN Mardy Elveria DEL, NP       haloperidol  lactate (HALDOL ) injection 5 mg  5 mg Intramuscular TID PRN Mardy Elveria DEL, NP       And   diphenhydrAMINE  (BENADRYL ) injection 50 mg  50 mg Intramuscular TID PRN Mardy Elveria DEL, NP       And   LORazepam  (ATIVAN ) injection 2 mg  2 mg Intramuscular TID PRN Mardy Elveria DEL, NP       haloperidol  lactate (HALDOL ) injection 10 mg  10 mg Intramuscular TID PRN Mardy Elveria DEL, NP       And   diphenhydrAMINE  (BENADRYL ) injection 50 mg  50 mg Intramuscular TID PRN Mardy Elveria DEL, NP       And   LORazepam  (ATIVAN ) injection 2 mg  2 mg Intramuscular TID PRN Mardy Elveria DEL, NP       feeding supplement (ENSURE PLUS HIGH PROTEIN) liquid 237 mL  237 mL  Oral TID BM Jadapalle, Sree, MD   237 mL  at 06/19/24 0924   hydrOXYzine  (ATARAX ) tablet 50 mg  50 mg Oral Q6H PRN Mardy Elveria DEL, NP   50 mg at 06/17/24 0845   levothyroxine  (SYNTHROID ) tablet 25 mcg  25 mcg Oral Q0600 Mardy Elveria DEL, NP   25 mcg at 06/19/24 9396   magnesium  hydroxide (MILK OF MAGNESIA) suspension 30 mL  30 mL Oral Daily PRN Mardy Elveria DEL, NP       multivitamin with minerals tablet 1 tablet  1 tablet Oral Daily Jadapalle, Sree, MD   1 tablet at 06/19/24 9192   nicotine  polacrilex (NICORETTE ) gum 2 mg  2 mg Oral PRN Jadapalle, Sree, MD   2 mg at 06/07/24 9171   sertraline  (ZOLOFT ) tablet 100 mg  100 mg Oral Daily May, Tanya, NP   100 mg at 06/19/24 9192   traZODone  (DESYREL ) tablet 25 mg  25 mg Oral QHS PRN Hunter, Crystal L, PA-C   25 mg at 06/18/24 2103    Lab Results:  Results for orders placed or performed during the hospital encounter of 06/06/24 (from the past 48 hours)  CBC with Differential/Platelet     Status: None   Collection Time: 06/18/24  2:37 PM  Result Value Ref Range   WBC 4.6 4.0 - 10.5 K/uL   RBC 4.63 4.22 - 5.81 MIL/uL   Hemoglobin 14.8 13.0 - 17.0 g/dL   HCT 54.7 60.9 - 47.9 %   MCV 97.6 80.0 - 100.0 fL   MCH 32.0 26.0 - 34.0 pg   MCHC 32.7 30.0 - 36.0 g/dL   RDW 86.7 88.4 - 84.4 %   Platelets 225 150 - 400 K/uL   nRBC 0.0 0.0 - 0.2 %   Neutrophils Relative % 42 %   Neutro Abs 1.9 1.7 - 7.7 K/uL   Lymphocytes Relative 34 %   Lymphs Abs 1.6 0.7 - 4.0 K/uL   Monocytes Relative 14 %   Monocytes Absolute 0.6 0.1 - 1.0 K/uL   Eosinophils Relative 9 %   Eosinophils Absolute 0.4 0.0 - 0.5 K/uL   Basophils Relative 0 %   Basophils Absolute 0.0 0.0 - 0.1 K/uL   Immature Granulocytes 1 %   Abs Immature Granulocytes 0.03 0.00 - 0.07 K/uL    Comment: Performed at [facility], [address] [place], [place] [address]  Comprehensive metabolic panel     Status: Abnormal   Collection Time: 06/18/24  2:37 PM  Result Value Ref Range    Sodium 140 135 - 145 mmol/L   Potassium 4.5 3.5 - 5.1 mmol/L   Chloride 102 98 - 111 mmol/L   CO2 27 22 - 32 mmol/L   Glucose, Bld 90 70 - 99 mg/dL    Comment: Glucose reference range applies only to samples taken after fasting for at least 8 hours.   BUN 25 (H) 8 - 23 mg/dL   Creatinine, Ser 8.63 (H) 0.61 - 1.24 mg/dL   Calcium 9.6 8.9 - 89.6 mg/dL   Total Protein 6.7 6.5 - 8.1 g/dL   Albumin 4.2 3.5 - 5.0 g/dL   AST 27 15 - 41 U/L   ALT 30 0 - 44 U/L   Alkaline Phosphatase 63 38 - 126 U/L   Total Bilirubin 0.2 0.0 - 1.2 mg/dL   GFR, Estimated 59 (L) >60 mL/min    Comment: (NOTE) Calculated using the CKD-EPI Creatinine Equation (2021)    Anion gap 10 5 - 15    Comment: Performed at [facility],  [address] [place], [place] [address]  TSH     Status: None   Collection Time: 06/18/24  2:37 PM  Result Value Ref Range   TSH 0.773 0.350 - 4.500 uIU/mL    Comment: Performed at [facility], [address] [place], [place] [address]     Blood Alcohol level:  Lab Results  Component Value Date   [facility] <15 06/05/2024   ETH 43 (H) 02/29/2024    Metabolic Disorder Labs: Lab Results  Component Value Date   HGBA1C 5.6 06/09/2024   MPG 114.02 06/09/2024   MPG 122.63 03/02/2024   No results found for: PROLACTIN Lab Results  Component Value Date   CHOL 178 06/09/2024   TRIG 92 06/09/2024   HDL 81 06/09/2024   CHOLHDL 2.2 06/09/2024   VLDL 18 06/09/2024   LDLCALC 78 06/09/2024   LDLCALC 100 (H) 03/02/2024    Physical Findings: AIMS:  , ,  ,  ,    CIWA:  CIWA-Ar Total: 0 COWS:      Psychiatric Specialty Exam:  Presentation  General Appearance:  Appropriate for Environment; Casual  Eye Contact: Fair  Speech: Clear and Coherent; Normal Rate  Speech Volume: Normal    Mood and Affect  Mood: Anxious, depressed  Affect: Appropriate   Thought Process  Thought Processes: Coherent; Linear  Orientation:Full (Time, Place and  Person)  Thought Content:Logical  Hallucinations: Denies Ideas of Reference:None  Suicidal Thoughts: Denies Homicidal Thoughts: Denies    Sensorium  Memory: Immediate Fair; Recent Fair  Judgment: Fair  Insight: Fair   Art Therapist  Concentration: Fair  Attention Span: Fair  Recall: [employer] of Knowledge: Fair  Language: Fair   Psychomotor Activity  Psychomotor Activity: Normal   Musculoskeletal: Strength & Muscle Tone: within normal limits Gait & Station: normal Assets  Assets: Manufacturing Systems Engineer; Desire for Improvement    Physical Exam: Physical Exam Vitals and nursing note reviewed.  Constitutional:      Appearance: Normal appearance.  Pulmonary:     Effort: Pulmonary effort is normal.  Neurological:     Mental Status: He is alert and oriented to person, place, and time.  Psychiatric:        Mood and Affect: Mood normal.        Behavior: Behavior normal.    Review of Systems  Respiratory:  Negative for shortness of breath.   Cardiovascular:  Negative for chest pain.  Gastrointestinal:  Negative for diarrhea, nausea and vomiting.  Psychiatric/Behavioral:  Positive for hallucinations and substance abuse. Negative for depression and suicidal ideas. The patient is nervous/anxious.   All other systems reviewed and are negative.  Blood pressure 111/83, pulse 62, temperature 98.4 F (36.9 C), temperature source Oral, resp. rate 16, height 5' 9 (1.753 m), weight 64.4 kg, SpO2 97%. Body mass index is 20.97 kg/m.  Diagnosis: Active Problems:   MDD (major depressive disorder), recurrent, severe, with psychosis (HCC)   Treatment Plan Summary:   Safety and Monitoring:             -- Voluntary admission to inpatient psychiatric unit for safety, stabilization and treatment             -- Daily contact with patient to assess and evaluate symptoms and progress in treatment             -- Patient's case to be discussed in  multi-disciplinary team meeting             -- Observation  Level: q15 minute checks             -- Vital signs:  q12 hours             -- Precautions: suicide, elopement, and assault   2. Psychiatric Diagnoses and Treatment:   - Zoloft  100 mg once daily          - Trazodone  25 mg at bedtime as needed for sleep - dose decreased per patient request due to feeling 50 mg was too much    -- The risks/benefits/side-effects/alternatives to this medication were discussed in detail with the patient and time was given for questions. The patient consents to medication trial.                -- Metabolic profile and EKG monitoring obtained while on an atypical antipsychotic (BMI: Lipid Panel: HbgA1c: QTc:)              -- Encouraged patient to participate in unit milieu and in scheduled group therapies                            3. Medical Issues Being Addressed:  No acute concerns.    4. Discharge Planning:             -- Likely Monday or Tuesday  -- Social work and case management to assist with discharge planning and identification of hospital follow-up needs prior to discharge  -- Estimated LOS: 10-12 days Case discussed with attending physician, Dr. Hellen, who is in agreement with current plan. Richard JONELLE Manners, MD 06/19/2024, 7:44 PM

## 2024-06-19 NOTE — Group Note (Signed)
 [facility] LCSW Group Therapy Note   Group Date: 06/19/2024 Start Time: 1300 End Time: 1400   Type of Therapy/Topic:  Group Therapy:  Emotion Regulation  Participation Level:  Active   Mood:  Description of Group:    The purpose of this group is to assist patients in learning to regulate negative emotions and experience positive emotions. Patients will be guided to discuss ways in which they have been vulnerable to their negative emotions. These vulnerabilities will be juxtaposed with experiences of positive emotions or situations, and patients challenged to use positive emotions to combat negative ones. Special emphasis will be placed on coping with negative emotions in conflict situations, and patients will process healthy conflict resolution skills.  Therapeutic Goals: Patient will identify two positive emotions or experiences to reflect on in order to balance out negative emotions:  Patient will label two or more emotions that they find the most difficult to experience:  Patient will be able to demonstrate positive conflict resolution skills through discussion or role plays:   Summary of Patient Progress:   During group, patient and group explored the ways in which our thoughts can impact our feelings which impacts our behaviors. Group along with facilitator completed a thermometer activity where different areas of life were explored. Participants were asked to notate in which zone these areas exist in on their personal thermometers. The group then discussed coping skills, and safety plans to help better prepare for potential stressors and learn to better emotionally regulate.    ?     Therapeutic Modalities:   Cognitive Behavioral Therapy Feelings Identification Dialectical Behavioral Therapy   Richard CHRISTELLA Kerns, LCSW

## 2024-06-19 NOTE — Group Note (Signed)
 Date:  06/19/2024 Time:  3:58 AM  Group Topic/Focus:  Personal Choices and Values:   The focus of this group is to help patients assess and explore the importance of values in their lives, how their values affect their decisions, how they express their values and what opposes their expression. Wrap-Up Group:   The focus of this group is to help patients review their daily goal of treatment and discuss progress on daily workbooks.    Participation Level:  Active  Participation Quality:  Appropriate and Attentive  Affect:  Appropriate  Cognitive:  Alert, Appropriate, and Oriented  Insight: Appropriate and Good  Engagement in Group:  Engaged  Modes of Intervention:  Discussion and Support  Additional Comments:  N/A  Butler LITTIE Gelineau 06/19/2024, 3:58 AM

## 2024-06-19 NOTE — Group Note (Signed)
 Date:  06/19/2024 Time:  1:10 PM  Group Topic/Focus:  Building Self Esteem:   The Focus of this group is helping patients become aware of the effects of self-esteem on their lives, the things they and others do that enhance or undermine their self-esteem, seeing the relationship between their level of self-esteem and the choices they make and learning ways to enhance self-esteem. Emotional Education:   The focus of this group is to discuss what feelings/emotions are, and how they are experienced.    Participation Level:  Active  Participation Quality:  Appropriate  Affect:  Appropriate  Cognitive:  Appropriate  Insight: Appropriate  Engagement in Group:  Engaged  Modes of Intervention:  Activity  Additional Comments:    Richard Moon June 06/19/2024, 1:10 PM

## 2024-06-19 NOTE — Group Note (Signed)
 Date:  06/19/2024 Time:  8:37 PM  Group Topic/Focus:  Wrap-Up Group:   The focus of this group is to help patients review their daily goal of treatment and discuss progress on daily workbooks.    Participation Level:  Active  Participation Quality:  Appropriate, Attentive, and Sharing  Affect:  Appropriate  Cognitive:  Appropriate  Insight: Appropriate  Engagement in Group:  Engaged  Modes of Intervention:  Activity, Discussion, Rapport Building, and Support  Additional Comments:    Ginny JONETTA Galeazzi 06/19/2024, 8:37 PM

## 2024-06-20 ENCOUNTER — Other Ambulatory Visit: Payer: Self-pay

## 2024-06-20 MED ORDER — QUETIAPINE FUMARATE 25 MG PO TABS
25.0000 mg | ORAL_TABLET | Freq: Every day | ORAL | 0 refills | Status: AC
  Filled 2024-06-20: qty 30, 30d supply, fill #0

## 2024-06-20 NOTE — BHH Counselor (Signed)
 CSW received phone call from [facility] [place], [place] to confirm pt admission. Admission confirmed by Kylie. She informed CSW that transportation would contact CSW directly regarding what time they would be here. No other concerns expressed. Contact ended without incident.   CSW received phone call from [facility] [place]. Tom confirmed address here and that he would be arriving around 2PM. No other concerns expressed. Contact ended without incident.   [name]. Chaim, MSW, LCSW, LCAS 06/20/2024 10:30 AM

## 2024-06-20 NOTE — Plan of Care (Signed)
  Problem: Education: Goal: Utilization of techniques to improve thought processes will improve Outcome: Progressing   

## 2024-06-20 NOTE — Discharge Summary (Signed)
 " Physician Discharge Summary Note  Patient:  Richard Moon is an 63 y.o., male MRN:  [id number] DOB:  September 24, 1960 Patient phone:  [phone] (home)  Patient address:   [address] [place] [address],   Total time spent: 40 min Date of Admission:  06/06/2024 Date of Discharge: 06/20/2024  Reason for Admission:  Suicidal ideation, homicidal ideation, substance abuse   Principal Problem: MDD (major depressive disorder), recurrent, severe, with psychosis (HCC) Discharge Diagnoses: Principal Problem:   MDD (major depressive disorder), recurrent, severe, with psychosis (HCC)   Past Psychiatric History: see h&p  Family Psychiatric  History: see h&p Social History:  Social History   Substance and Sexual Activity  Alcohol Use Yes   Comment: Occasional alcohol use     Social History   Substance and Sexual Activity  Drug Use Yes   Types: Marijuana, Cocaine   Comment: daily cocaine use (reported $200-300/ daily); occasional cannabis use    Social History   Socioeconomic History   Marital status: Single    Spouse name: Not on file   Number of children: Not on file   Years of education: Not on file   Highest education level: Not on file  Occupational History   Not on file  Tobacco Use   Smoking status: Some Days    Current packs/day: 0.25    Average packs/day: 0.3 packs/day for 20.0 years (5.0 ttl pk-yrs)    Types: Cigarettes   Smokeless tobacco: Never  Vaping Use   Vaping status: Former  Substance and Sexual Activity   Alcohol use: Yes    Comment: Occasional alcohol use   Drug use: Yes    Types: Marijuana, Cocaine    Comment: daily cocaine use (reported $200-300/ daily); occasional cannabis use   Sexual activity: Yes    Birth control/protection: Condom  Other Topics Concern   Not on file  Social History Narrative   Not on file   Social Drivers of Health   Tobacco Use: High Risk (06/06/2024)   Patient History    Smoking Tobacco Use: Some Days     Smokeless Tobacco Use: Never    Passive Exposure: Not on file  Financial Resource Strain: Not on file  Food Insecurity: Food Insecurity Present (06/06/2024)   Epic    Worried About Programme Researcher, Broadcasting/film/video in the Last Year: Sometimes true    Ran Out of Food in the Last Year: Sometimes true  Transportation Needs: Unmet Transportation Needs (06/06/2024)   Epic    Lack of Transportation (Medical): Yes    Lack of Transportation (Non-Medical): Yes  Physical Activity: Not on file  Stress: Not on file  Social Connections: Not on file  Depression (PHQ2-9): High Risk (06/08/2023)   Depression (PHQ2-9)    PHQ-2 Score: 12  Alcohol Screen: Medium Risk (06/06/2024)   Alcohol Screen    Last Alcohol Screening Score (AUDIT): 14  Housing: High Risk (06/06/2024)   Epic    Unable to Pay for Housing in the Last Year: Yes    Number of Times Moved in the Last Year: 4    Homeless in the Last Year: Yes  Utilities: Not At Risk (06/06/2024)   Epic    Threatened with loss of utilities: No  Health Literacy: Not on file   Past Medical History:  Past Medical History:  Diagnosis Date   Cocaine abuse (HCC)    Homeless    Hypothyroidism 03/04/2024   Hypovitaminosis D 03/04/2024   Major depressive disorder, recurrent severe without psychotic  features (HCC)    Prediabetes 03/04/2024   Substance induced mood disorder (HCC)     Past Surgical History:  Procedure Laterality Date   HERNIA REPAIR     Family History: History reviewed. No pertinent family history.  Hospital Course:    On admission, patient endorsed suicidal and homicidal thoughts, depressive symptoms, and cocaine and alcohol use. During the admission, the patient engaged in treatment, remained cooperative, and demonstrated consistent improvement in mood stability, sleep, and appetite. He was compliant with medications and tolerated adjustments without significant adverse effects. He responded well to treatment with Zoloft  and Seroquel . He  consistently denied suicidal ideation, homicidal ideation, intent, or plan, and denied hallucinations on serial interviews. He remained linear, logical, and future oriented, and was able to discuss coping strategies, support system, and crisis resources. He maintained safe behaviors on the unit. He denied access to lethal means. Patient expressed desire to enter residential treatment and was accepted at [facility].  Hospitalist was consulted due to patient's complaints of lightheadedness; after evaluation, hospitalisted signed off and was agreeable to patient's discharge today from a medical standpoint, with recommendations for patient to follow up with Sleep Medicine specialist for evaluation of possible sleep apnea, also advised to continue levothyroxine  as prescribed.   Detailed risk assessment is complete based on clinical exam and individual risk factors and acute suicide risk is low and acute violence risk is low.    On the day of discharge, patient denies SI/HI/plan and denies hallucinations.  Patient remains future oriented and is willing to participate in outpatient mental health services.   Currently, all modifiable risk of harm to self/harm to others have been addressed and patient is no longer appropriate for the acute inpatient setting and is able to continue treatment for mental health needs in the community with the supports as indicated below.  Patient is educated and verbalized understanding of discharge plan of care including medications, follow-up appointments, mental health resources and further crisis services in the community.  He is instructed to call 911 or present to the nearest emergency room should he experience any decompensation in mood, disturbance of bowel or return of suicidal/homicidal ideations.  Patient verbalizes understanding of this education and agrees to this plan of care.   Physical Findings: AIMS:  , ,  ,  ,    CIWA:  CIWA-Ar Total: 0 COWS:       Psychiatric Specialty Exam:  Presentation  General Appearance:  Appropriate for Environment  Eye Contact: Good  Speech: Clear and Coherent  Speech Volume: Normal    Mood and Affect  Mood: Euthymic  Affect: Appropriate   Thought Process  Thought Processes: Coherent; Linear  Descriptions of Associations:Intact  Orientation:Full (Time, Place and Person)  Thought Content:Logical  Hallucinations:Hallucinations: None  Ideas of Reference:None  Suicidal Thoughts:Suicidal Thoughts: No  Homicidal Thoughts:Homicidal Thoughts: No   Sensorium  Memory: Immediate Fair; Recent Fair  Judgment: Fair  Insight: Fair   Art Therapist  Concentration: Fair  Attention Span: Fair  Recall: [employer] of Knowledge: Fair  Language: Fair   Psychomotor Activity  Psychomotor Activity: Psychomotor Activity: Normal  Musculoskeletal: Strength & Muscle Tone: within normal limits Gait & Station: normal Assets  Assets: Manufacturing Systems Engineer; Desire for Improvement   Sleep  Sleep: Sleep: Fair    Physical Exam: Physical Exam Vitals and nursing note reviewed.  Constitutional:      Appearance: Normal appearance.  HENT:     Head: Normocephalic and atraumatic.  Nose: Nose normal.  Eyes:     Conjunctiva/sclera: Conjunctivae normal.  Pulmonary:     Effort: Pulmonary effort is normal.  Neurological:     Mental Status: He is alert and oriented to person, place, and time.  Psychiatric:        Attention and Perception: Attention and perception normal. He does not perceive auditory or visual hallucinations.        Mood and Affect: Mood and affect normal.        Speech: Speech normal.        Behavior: Behavior is cooperative.        Thought Content: Thought content normal. Thought content is not paranoid or delusional. Thought content does not include homicidal or suicidal ideation. Thought content does not include homicidal or suicidal plan.         Cognition and Memory: Cognition normal.        Judgment: Judgment normal.    Review of Systems  Psychiatric/Behavioral:  Positive for depression. Negative for hallucinations and suicidal ideas. The patient is nervous/anxious. The patient does not have insomnia.    Blood pressure 101/79, pulse 63, temperature 98.1 F (36.7 C), temperature source Oral, resp. rate 16, height 5' 9 (1.753 m), weight 64.4 kg, SpO2 99%. Body mass index is 20.97 kg/m.   Tobacco Use History[1] Tobacco Cessation:  A prescription for an FDA-approved tobacco cessation medication was offered at discharge and the patient refused   Blood Alcohol level:  Lab Results  Component Value Date   [facility] <15 06/05/2024   ETH 43 (H) 02/29/2024    Metabolic Disorder Labs:  Lab Results  Component Value Date   HGBA1C 5.6 06/09/2024   MPG 114.02 06/09/2024   MPG 122.63 03/02/2024   No results found for: PROLACTIN Lab Results  Component Value Date   CHOL 178 06/09/2024   TRIG 92 06/09/2024   HDL 81 06/09/2024   CHOLHDL 2.2 06/09/2024   VLDL 18 06/09/2024   LDLCALC 78 06/09/2024   LDLCALC 100 (H) 03/02/2024    See Psychiatric Specialty Exam and Suicide Risk Assessment completed by Attending Physician prior to discharge.  Discharge destination:  Other:  Residential treatment at [facility]  Is patient on multiple antipsychotic therapies at discharge:  No   Has Patient had three or more failed trials of antipsychotic monotherapy by history:  No  Recommended Plan for Multiple Antipsychotic Therapies: NA  Discharge Instructions     Increase activity slowly   Complete by: As directed       Allergies as of 06/20/2024   No Known Allergies      Medication List     STOP taking these medications    traZODone  50 MG tablet Commonly known as: DESYREL        TAKE these medications      Indication  [employer]ite/Antioxidants Tabs Take 1 tablet by mouth daily.  Indication: Vitamin  Deficiency   levothyroxine  25 MCG tablet Commonly known as: SYNTHROID  Take 1 tablet (25 mcg total) by mouth daily at 6 (six) AM.  Indication: Underactive Thyroid    QUEtiapine  25 MG tablet Commonly known as: SEROQUEL  Take 1 tablet (25 mg total) by mouth at bedtime.  Indication: Trouble Sleeping, Major Depressive Disorder   sertraline  100 MG tablet Commonly known as: ZOLOFT  Take 1 tablet (100 mg total) by mouth daily. What changed:  medication strength how much to take  Indication: Major Depressive Disorder        Follow-up Information  [facility] Follow up in 2 week(s).   Specialty: Sleep Medicine Why: [facility] Contact information: [address] [place]   [address] [phone]        [facility]. Go to.   Why: You have been accepted for residential treatment on 06/20/24. You will receive continued programming through the [facility]. Contact information: [address] [place], [place] [address] Phone: [phone]                Follow-up recommendations:  Activity:  as tolerated   Case discussed with supervising physician, Dr. [name], who is in agreement with discharge plan.  Signed: Camelia LITTIE Lukes, PA-C 06/20/2024, 9:50 AM            [1]  Social History Tobacco Use  Smoking Status Some Days   Current packs/day: 0.25   Average packs/day: 0.3 packs/day for 20.0 years (5.0 ttl pk-yrs)   Types: Cigarettes  Smokeless Tobacco Never   "

## 2024-06-20 NOTE — Group Note (Signed)
 Recreation Therapy Group Note   Group Topic:Goal Setting  Group Date: 06/20/2024 Start Time: 1000 End Time: 1100 Facilitators: Celestia Jeoffrey BRAVO, LRT, CTRS Location: Craft Room  Group Description: Product/process Development Scientist. Patients were given many different magazines, a glue stick, markers, and a piece of cardstock paper. LRT and pts discussed the importance of having goals in life. LRT and pts discussed the difference between short-term and long-term goals, as well as what a SMART goal is. LRT encouraged pts to create a vision board, with images they picked and then cut out with safety scissors from the magazine, for themselves, that capture their short and long-term goals. LRT encouraged pts to show and explain their vision board to the group.   Goal Area(s) Addressed:  Patient will gain knowledge of short vs. long term goals.  Patient will identify goals for themselves. Patient will practice setting SMART goals. Patient will verbalize their goals to LRT and peers.   Affect/Mood: Appropriate   Participation Level: Minimal    Clinical Observations/Individualized Feedback: Richard Moon came late to group. Pt did not make a vision board or share any goals.  Plan: Continue to engage patient in RT group sessions 2-3x/week.   Jeoffrey BRAVO Celestia, LRT, CTRS 06/20/2024 11:13 AM

## 2024-06-20 NOTE — Progress Notes (Signed)
" °   06/20/24 0600  Psych Admission Type (Psych Patients Only)  Admission Status Voluntary  Psychosocial Assessment  Patient Complaints None  Eye Contact Fair  Facial Expression Flat  Affect Depressed  Speech Logical/coherent  Interaction Assertive  Motor Activity Other (Comment)  Appearance/Hygiene Unremarkable  Behavior Characteristics Appropriate to situation  Mood Preoccupied  Aggressive Behavior  Effect No apparent injury  Thought Process  Coherency WDL  Content WDL  Delusions None reported or observed  Perception WDL  Hallucination None reported or observed  Judgment WDL  Confusion WDL  Danger to Others  Danger to Others None reported or observed    "

## 2024-06-20 NOTE — Progress Notes (Signed)
" °   06/20/24 0900  Psych Admission Type (Psych Patients Only)  Admission Status Voluntary  Psychosocial Assessment  Patient Complaints None  Eye Contact Fair  Facial Expression Flat  Affect Depressed  Speech Soft;Logical/coherent  Interaction Superficial  Motor Activity Other (Comment) (approriate)  Appearance/Hygiene Unremarkable  Behavior Characteristics Appropriate to situation  Mood Depressed  Thought Process  Coherency WDL  Content WDL  Delusions None reported or observed  Perception WDL  Hallucination None reported or observed  Judgment WDL  Confusion WDL  Danger to Self  Current suicidal ideation? Denies    "
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
## 2024-06-20 NOTE — Progress Notes (Signed)
 Discharge Note:  Patient denies SI/HI/AVH at this time. Discharge instructions, AVS, prescriptions, and transition record gone over with patient. Patient agrees to comply with medication management, follow-up visit, and outpatient therapy. Patient belongings returned to patient. Patient questions and concerns addressed and answered. 30 day supply of meds provided to patient. [facility].

## 2024-06-20 NOTE — Plan of Care (Signed)

## 2024-06-20 NOTE — Progress Notes (Signed)
" °  [facility] Adult Case Management Discharge Plan :  Will you be returning to the same living situation after discharge:  No. At discharge, do you have transportation home?: Yes,  transportation being provided by [facility].  Do you have the ability to pay for your medications: Yes,  [employer] Plan.   Release of information consent forms completed and in the chart;  Patient's signature needed at discharge.  Patient to Follow up at:  Follow-up Information     [facility] at [facility] Follow up in 2 week(s).   Specialty: Sleep Medicine Why: [facility] Contact information: [address] [place]  [address] [phone]        [facility]. Go to.   Why: You have been accepted for residential treatment on 06/20/24. You will receive continued programming through the [facility]. Contact information: [address] [place], [place] [address] Phone: [phone]                Next level of care provider has access to [facility] Link:no  Safety Planning and Suicide Prevention discussed: Yes,  SPE completed with pt.      Has patient been referred to the Quitline?: Patient refused referral for treatment  Patient has been referred for addiction treatment: Yes, the patient will follow up with an outpatient provider for substance use disorder. Psychiatrist/APP: appointment made and Therapist: appointment made.  Nadara JONELLE Fam, LCSW 06/20/2024, 10:31 AM "

## 2024-07-14 NOTE — Progress Notes (Signed)
 Pt attended 03/25/2024 screening event with BP of 113/75 and blood sugar was 137. Pt noted at event that he does not have a PCP. At event pt did indicate housing SDOH needs. Pt also noted that he is a smoker at the event.   Per 60 day f/u pt was reached out via phone and call could not be completed. Per previous CHW encounter address pt has on file is invalid, and no updated address has been provided, letter cannot be sent at this time.   Per chart review pt does not have a PCP, has insurance, and is a smoker. Pt does indicate food, housing, & transportation SDOH needs at this time.  Additional pt f/u to be scheduled at this time per health equity protocol.
